# Patient Record
Sex: Female | Born: 1945 | Race: White | Hispanic: No | Marital: Married | Smoking: Former smoker
Health system: Southern US, Community
[De-identification: ages and names within clinical notes are randomized; demographics above are authoritative.]

## PROBLEM LIST (undated history)

## (undated) DIAGNOSIS — N189 Chronic kidney disease, unspecified: Secondary | ICD-10-CM

## (undated) DIAGNOSIS — E785 Hyperlipidemia, unspecified: Secondary | ICD-10-CM

## (undated) DIAGNOSIS — J449 Chronic obstructive pulmonary disease, unspecified: Secondary | ICD-10-CM

## (undated) DIAGNOSIS — F32A Depression, unspecified: Secondary | ICD-10-CM

## (undated) DIAGNOSIS — U071 COVID-19: Secondary | ICD-10-CM

## (undated) DIAGNOSIS — M1990 Unspecified osteoarthritis, unspecified site: Secondary | ICD-10-CM

## (undated) DIAGNOSIS — I10 Essential (primary) hypertension: Secondary | ICD-10-CM

## (undated) DIAGNOSIS — Z9289 Personal history of other medical treatment: Secondary | ICD-10-CM

## (undated) DIAGNOSIS — I739 Peripheral vascular disease, unspecified: Secondary | ICD-10-CM

## (undated) DIAGNOSIS — Z8719 Personal history of other diseases of the digestive system: Secondary | ICD-10-CM

## (undated) DIAGNOSIS — K219 Gastro-esophageal reflux disease without esophagitis: Secondary | ICD-10-CM

## (undated) DIAGNOSIS — C801 Malignant (primary) neoplasm, unspecified: Secondary | ICD-10-CM

## (undated) DIAGNOSIS — K449 Diaphragmatic hernia without obstruction or gangrene: Secondary | ICD-10-CM

## (undated) DIAGNOSIS — J302 Other seasonal allergic rhinitis: Secondary | ICD-10-CM

## (undated) HISTORY — DX: Hyperlipidemia, unspecified: E78.5

## (undated) HISTORY — DX: Diaphragmatic hernia without obstruction or gangrene: K44.9

## (undated) HISTORY — PX: BREAST ENHANCEMENT SURGERY: SHX7

## (undated) HISTORY — DX: Essential (primary) hypertension: I10

## (undated) HISTORY — PX: TONSILLECTOMY: SUR1361

## (undated) HISTORY — PX: TUBAL LIGATION: SHX77

---

## 1979-04-01 HISTORY — PX: BREAST SURGERY: SHX581

## 1990-07-31 HISTORY — PX: CERVICAL SPINE SURGERY: SHX589

## 1992-07-31 HISTORY — PX: BACK SURGERY: SHX140

## 2004-06-09 ENCOUNTER — Ambulatory Visit: Payer: Self-pay | Admitting: Family Medicine

## 2004-06-21 ENCOUNTER — Ambulatory Visit: Payer: Self-pay | Admitting: Family Medicine

## 2004-09-02 ENCOUNTER — Ambulatory Visit: Payer: Self-pay | Admitting: Family Medicine

## 2004-12-06 ENCOUNTER — Ambulatory Visit: Payer: Self-pay | Admitting: Family Medicine

## 2005-05-10 ENCOUNTER — Ambulatory Visit: Payer: Self-pay | Admitting: Family Medicine

## 2005-06-02 ENCOUNTER — Ambulatory Visit (HOSPITAL_COMMUNITY): Admission: RE | Admit: 2005-06-02 | Discharge: 2005-06-02 | Payer: Self-pay | Admitting: Neurosurgery

## 2005-07-04 ENCOUNTER — Ambulatory Visit: Payer: Self-pay | Admitting: Family Medicine

## 2005-07-31 HISTORY — PX: TOTAL HIP ARTHROPLASTY: SHX124

## 2005-07-31 HISTORY — PX: JOINT REPLACEMENT: SHX530

## 2005-09-26 ENCOUNTER — Ambulatory Visit: Payer: Self-pay | Admitting: Family Medicine

## 2005-10-09 ENCOUNTER — Inpatient Hospital Stay (HOSPITAL_COMMUNITY): Admission: RE | Admit: 2005-10-09 | Discharge: 2005-10-12 | Payer: Self-pay | Admitting: Orthopedic Surgery

## 2005-10-09 ENCOUNTER — Ambulatory Visit: Payer: Self-pay | Admitting: Physical Medicine & Rehabilitation

## 2007-07-22 ENCOUNTER — Emergency Department (HOSPITAL_COMMUNITY): Admission: EM | Admit: 2007-07-22 | Discharge: 2007-07-23 | Payer: Self-pay | Admitting: Emergency Medicine

## 2007-08-20 ENCOUNTER — Other Ambulatory Visit: Admission: RE | Admit: 2007-08-20 | Discharge: 2007-08-20 | Payer: Self-pay | Admitting: Pediatrics

## 2007-09-03 ENCOUNTER — Ambulatory Visit (HOSPITAL_COMMUNITY): Admission: RE | Admit: 2007-09-03 | Discharge: 2007-09-03 | Payer: Self-pay | Admitting: Family Medicine

## 2007-10-28 ENCOUNTER — Encounter: Admission: RE | Admit: 2007-10-28 | Discharge: 2007-10-28 | Payer: Self-pay | Admitting: Family Medicine

## 2008-10-28 ENCOUNTER — Encounter: Admission: RE | Admit: 2008-10-28 | Discharge: 2008-10-28 | Payer: Self-pay | Admitting: Family Medicine

## 2009-10-11 ENCOUNTER — Encounter: Admission: RE | Admit: 2009-10-11 | Discharge: 2009-10-11 | Payer: Self-pay | Admitting: *Deleted

## 2009-10-11 ENCOUNTER — Other Ambulatory Visit: Admission: RE | Admit: 2009-10-11 | Discharge: 2009-10-11 | Payer: Self-pay | Admitting: *Deleted

## 2009-10-29 ENCOUNTER — Encounter: Admission: RE | Admit: 2009-10-29 | Discharge: 2009-10-29 | Payer: Self-pay | Admitting: *Deleted

## 2010-08-20 ENCOUNTER — Encounter: Payer: Self-pay | Admitting: Neurosurgery

## 2010-11-01 ENCOUNTER — Other Ambulatory Visit: Payer: Self-pay | Admitting: Family Medicine

## 2010-11-01 DIAGNOSIS — Z1231 Encounter for screening mammogram for malignant neoplasm of breast: Secondary | ICD-10-CM

## 2010-11-10 ENCOUNTER — Ambulatory Visit
Admission: RE | Admit: 2010-11-10 | Discharge: 2010-11-10 | Disposition: A | Discharge status: Home / Self Care | Payer: Medicare Other | Source: Ambulatory Visit | Attending: Family Medicine | Admitting: Family Medicine

## 2010-11-10 DIAGNOSIS — Z1231 Encounter for screening mammogram for malignant neoplasm of breast: Secondary | ICD-10-CM

## 2010-12-16 NOTE — H&P (Signed)
[name], [name]              ACCOUNT NO.:  [id number]   MEDICAL RECORD NO.:  [id number]          PATIENT TYPE:  INP   LOCATION:  NA                           FACILITY:  [facility]   PHYSICIAN:  Ollen Gross, M.D.    DATE OF BIRTH:  Aug 31, 1945   DATE OF ADMISSION:  10/09/2005  DATE OF DISCHARGE:                                HISTORY & PHYSICAL   DATE OF OFFICE VISIT HISTORY AND PHYSICAL:  September 19, 2005.   CHIEF COMPLAINT:  Right hip pain.   HISTORY OF PRESENT ILLNESS:  Mr. Donna [name] is a 65 year old female who was  referred over from Dr. Shirlean [name] with a 2 year progressive history of  worsening dysfunction in and about the right hip. Dr. Newell [name] has worked  her up in the past and was concerned that her problem was coming more from  her hip. Hip evaluation was done and she was found to have significant  arthritic changes. She is seen in the office with progressive worsening pain  especially over the past 3 or 4 months now. X-rays in the office showed  severe end-stage arthritic changes on the right hip with bone on bone, no  joint space left but it does have an element of dysplasia on the right with  some uncovering about 25-30% of the femoral head. She is at a point now  where she has had end-stage arthritis likely secondary to dysplasia with  worsening osteoarthritis. Given her pain and dysfunction, the most  predictable means would be a total hip replacement, relieving her pain and  restoring her function. The risks and benefits have been discussed and the  patient has elected to proceed with surgery.   ALLERGIES:  THORAZINE.   CURRENT MEDICATIONS:  1.  Prilosec 20 mg daily.  2.  Metoprolol 50 mg daily.  3.  Hyoscyamine 0.125 mg dissolve 1 tab sublingual every 4 h p.r.n. spasms.   PAST MEDICAL HISTORY:  Episodic anxiety, hypertension, reflux disease,  postmenopausal, lumbar degenerative disk disease.   PAST SURGICAL HISTORY:  She has undergone 2 disk rupture  removal and fusion  and cervical spine. Also bone spur excision left big toe. Also  tonsillectomy.   SOCIAL HISTORY:  Separated, retired, one pack per day smoker, 1-2 glasses of  wine daily, 2 children. She has friends that will be a help assisting her  after surgery.   FAMILY HISTORY:  Mother with a history of arthritis, hypertension, diabetes  and renal cancer. Sister with a history of cancer. Father deceased with a  history of arthritis.   REVIEW OF SYSTEMS:  GENERAL:  No fever, chills or night sweats. NEUROLOGIC:  No seizure, syncope, paralysis. RESPIRATORY:  No shortness of breath,  productive cough or hemoptysis. CARDIOVASCULAR:  No chest pain, angina,  orthopnea. GI:  No nausea, vomiting, diarrhea or constipation. GU:  No  dysuria, hematuria or discharge. MUSCULOSKELETAL:  Right hip.   PHYSICAL EXAMINATION:  VITAL SIGNS:  Pulse 68, respirations 12, blood  pressure 136/78.  GENERAL:  A 65 year old white female, well-nourished, well-developed, tall  frame, alert, oriented and cooperative, pleasant at time  of exam. She is a  good historian.  HEENT:  Normocephalic, atraumatic. Pupils are round and reactive. Oropharynx  clear. EOM intact.  NECK:  Supple.  CHEST:  Clear anterior and posterior chest walls. No rhonchi, rales or  wheezing.  HEART:  Regular rate and rhythm without murmur. S1, S2 noted.  ABDOMEN:  Soft, nontender, bowel sounds present.  RECTAL/BREASTS/GENITALIA:  Not done not pertinent to present illness.  EXTREMITIES:  Right hip. The hip shows flexion of only 90 degrees. There is  full extension, 20 degrees of abduction, 0 internal rotation, 15 degrees of  external rotation.   IMPRESSION:  1.  Osteoarthritis right hip.  2.  Episodic anxiety.  3.  Hypertension.  4.  Reflux disease.  5.  Lumbar degenerative disk disease.  6.  Postmenopausal.   PLAN:  The patient admitted to [facility] to undergo a right total  hip arthroplasty. The surgery will be  performed by Dr. Ollen Gross. She  lives alone and would like to look into inpatient rehab postoperatively.      [name], P.A.      Ollen Gross, M.D.  Electronically Signed    ALP/MEDQ  D:  10/08/2005  T:  10/09/2005  Job:  [id number]   cc:   [name]  Fax: [phone]

## 2010-12-16 NOTE — Op Note (Signed)
Donna, [name]              ACCOUNT NO.:  [id number]   MEDICAL RECORD NO.:  [id number]          PATIENT TYPE:  INP   LOCATION:  1521                         FACILITY:  [facility]   PHYSICIAN:  Ollen Gross, M.D.    DATE OF BIRTH:  Oct 11, 1945   DATE OF PROCEDURE:  10/09/2005  DATE OF DISCHARGE:                                 OPERATIVE REPORT   PREOPERATIVE DIAGNOSIS:  Osteoarthritis right hip.   POSTOPERATIVE DIAGNOSIS:  Osteoarthritis right hip.   PROCEDURE:  Right total hip arthroplasty.   SURGEON:  Ollen Gross, M.D.   ASSISTANT:  Dwyane Luo, PA Student   ANESTHESIA:  General.   ESTIMATED BLOOD LOSS:  200 mL.   DRAIN:  None.   COMPLICATIONS:  None.   CONDITION:  Stable to recovery room.   BRIEF CLINICAL NOTE:  [name] is a 65 year old female who has end-stage  arthritis of the right hip with some dysplasia. She is having intractable  pain and presents for a total hip arthroplasty.   PROCEDURE IN DETAIL:  After successful administration of general anesthetic,  the patient is placed in the left lateral decubitus position with the right  side up and held with the hip positioner. Right lower extremity is isolated  from her perineum with plastic drapes and prepped and draped in usual  sterile fashion. A short posterolateral incision is made with a 10-blade  through the subcutaneous tissue to the level of fascia lata which is incised  in line with the skin incision. Sciatic nerve is palpated and protected and  a short rotator is isolated off the femur. Capsulectomy is performed and the  hip dislocated. The center of the femoral head is marked and a trial  prosthesis placed into the center of the trial head corresponds to the  center of her native femoral head. Osteotomy lines marked on the femoral  neck and osteotomy made with an oscillating saw. Femoral head is removed and  the femur retracted anteriorly to gain acetabular exposure. Labrum and  osteophytes removed,  the reaming begins at 45 mm. I reamed in increments of  2 mm up to 53 mm and then a 54 mm pinnacle acetabular shell was placed in  anatomic position and transfixed with two dome screws with excellent  purchase. Trial 36 mm neutral liner is placed.   The femur is prepared with the canal finder and irrigation. Axial reaming is  performed up to 11.5 mm, proximal reaming to a 12F and the sleeve machine to  a large. A 12F large trial sleeve is placed with a 16 x 11 stem and a 36-  plus-6 neck. We matched a native anteversion with the stem. A 36-plus-zero  head is placed. It reduces easily and we went to a with a 36-plus-3 which  had better tension. Range of motion was great with full extension, full  external rotation, 70 degrees of flexion, 40 degrees adduction, 90 degrees  internal rotation,  90 degrees of flexion, and 90 degrees internal rotation.  By placing the right leg on top of the left, it felt as though leg lengths  were equal. The trials were all removed, and then the permanent apex hole  eliminator was then placed into the acetabular shell. A permanent 36 mm  neutral Ultramet metal liner is placed. It is a metal-on-metal hip  replacement.   On the femoral side we placed the permanent 35F large sleeve with a  permanent 16 x 11 stem with a 36-plus-6 neck matching her native  anteversion. A 36-plus-3 head is placed and the hip is reduced with the same  stability parameters. The wound was copiously irrigated with saline solution  and short rotators reattached to the femur through drill holes. The fascia  lata was closed over Hemovac drain with interrupted #1 Vicryl, subcu closed  with #1 and 2-0 Vicryl, and subcuticular running 4-0 Monocryl. Incisions  were cleaned and dried, and Steri-Strips and a bulky sterile dressing  applied. Note, we did not place a Hemovac drain. She had barely any bleeding  thus we did not use a drain. Once the skin was closed, the incision was  cleaned and  dried; and Steri-Strips and bulky sterile dressing applied. She  is then placed into a knee immobilizer, awakened, and transported to  recovery in stable condition.      Ollen Gross, M.D.  Electronically Signed     FA/MEDQ  D:  10/09/2005  T:  10/10/2005  Job:  [id number]

## 2010-12-16 NOTE — Discharge Summary (Signed)
Donna [name], Donna [name]              ACCOUNT NO.:  [id number]   MEDICAL RECORD NO.:  [id number]          PATIENT TYPE:  INP   LOCATION:  1521                         [facility]ACILITY[facility]   PHYSICIAN:  Ollen Gross, M.D.    DATE OF BIRTH:  01/28/1946   DATE OF ADMISSION:  10/09/2005  DATE OF DISCHARGE:  10/12/2005                                 DISCHARGE SUMMARY   ADMITTING DIAGNOSES:  1.  Osteoarthritis, right hip.  2.  Episodic anxiety.  3.  Hypertension.  4.  Reflux disease.  5.  Lumbar degenerative disk disease.  6.  Postmenopausal.   DISCHARGE DIAGNOSES:  1.  Osteoarthritis, right hip, status post right total hip arthroplasty.  2.  Episodic anxiety.  3.  Hypertension.  4.  Reflux disease.  5.  Lumbar degenerative disk disease.  6.  Postmenopausal.   CONSULTATIONS:  Rehab services.   BRIEF HISTORY:  Donna [name] is a 65 year old female with end stage arthritis of  her right hip with some dysplasia and intractable pain, now presents for a  total hip arthroplasty.  Preoperative CBC, hemoglobin 15.5, hematocrit of  43.4, white cell count 7.6.  Postoperative hemoglobin 12.7, H & H 12.0 and  34.2.  PT/PTT preoperatively 13.0 and 30, respectively, INR 1.0.  Serial pro-  times were followed, last PT/INR 26.0 and 2.4.  Chem panel on admission all  within normal limits.  Serial BMET were followed, glucose went up from 118  to 134, last noted at 135.  Electrolytes remained within normal limits.  Preoperative BUN negative.  Blood group type O positive.   EKG, dated October 04, 2005, normal sinus rhythm, normal EKG, no previous  tracing, confirmed by Dr. Graceann Congress.  Two-view chest, October 04, 2005, no  acute cardiopulmonary process.  Right hip films, October 04, 2005, bilateral  hip osteoarthritis as above, right greater than left.  Hip and pelvis film  on October 09, 2005, satisfactory right total hip.   HOSPITAL COURSE:  The patient was admitted to [facility] and  underwent the  above-stated procedure and tolerated it well, placed on PCA  and p.o. analgesic for pain control following surgery.  On day one, she was  doing well except complaints of hip pain, had a little bit of a low-grade  temperature, treated with antipyretics and incentive spirometer.  She  started getting up out of bed by day two.  She was feeling much better and  actually walked 200 feet with a rolling walker.  She was doing extremely  well and felt that she was doing so well that she would be able to do home.  Rehab called and so it was ordered postoperatively and felt that the patient  would progress well but if did not, would require inpatient SACU services.  Fortunately, she did very well with her physical therapy, however her  bedroom is upstairs so arranged hospital bed.  Once all the arrangements  were made, she was discharged home on the following day on October 12, 2005.   DISCHARGE PLAN:  1.  The patient was discharged to home on October 12, 2005.  2.  Discharge diagnoses:  Please see above.  3.  Discharge medications:  Coumadin, Percocet, Robaxin.  4.  Diet:  As tolerated.  5.  Follow-up:  Two weeks.  6.  Activity:  Partial weightbearing, 25-50%, right lower extremity.  Home      with PT, home with nursing.  May start showering.   DISPOSITION:  Home.   CONDITION UPON DISCHARGE:  Improved.      [name], P.A.      Ollen Gross, M.D.  Electronically Signed    ALP/MEDQ  D:  11/15/2005  T:  11/16/2005  Job:  [id number]   cc:   Ollen Gross, M.D.  Fax: [phone]   [name]  Fax: [phone]

## 2010-12-29 ENCOUNTER — Encounter: Payer: Self-pay | Admitting: Pulmonary Disease

## 2010-12-29 ENCOUNTER — Ambulatory Visit (INDEPENDENT_AMBULATORY_CARE_PROVIDER_SITE_OTHER): Payer: Medicare Other | Admitting: Pulmonary Disease

## 2010-12-29 ENCOUNTER — Ambulatory Visit (INDEPENDENT_AMBULATORY_CARE_PROVIDER_SITE_OTHER)
Admission: RE | Admit: 2010-12-29 | Discharge: 2010-12-29 | Disposition: A | Discharge status: Home / Self Care | Payer: Medicare Other | Source: Ambulatory Visit | Attending: Pulmonary Disease | Admitting: Pulmonary Disease

## 2010-12-29 VITALS — BP 144/76 | HR 52 | Temp 97.7°F | Ht 66.5 in | Wt 180.4 lb

## 2010-12-29 DIAGNOSIS — R0989 Other specified symptoms and signs involving the circulatory and respiratory systems: Secondary | ICD-10-CM

## 2010-12-29 DIAGNOSIS — R0609 Other forms of dyspnea: Secondary | ICD-10-CM

## 2010-12-29 DIAGNOSIS — R0600 Dyspnea, unspecified: Secondary | ICD-10-CM | POA: Insufficient documentation

## 2010-12-29 NOTE — Patient Instructions (Signed)
Will check a cxr today, and will call with results. Will schedule for breathing studies, and see you back same day to discuss results.

## 2010-12-29 NOTE — Assessment & Plan Note (Signed)
The pt notes doe that is interfering with her QOL.  She does have a h/o smoking, but has quit since 16mos ago.  She also has gained 30 pounds the last one year, and has not been able to lose despite trying to exercise consistently.  At this point, she will need full pfts and cxr for evaluation.  Will also make sure she is not hypothyroid or anemic.  She has had recent bloodwork with her primary md.

## 2010-12-29 NOTE — Progress Notes (Signed)
  Subjective:    Patient ID: Donna [name], female    DOB: Jun 17, 1946, 65 y.o.   MRN: [id number]  HPI The pt is a 65y/o female who I have been asked to see for evaluation of doe.  The pt reports doe over the last one year, but does not feel it has been incremental.  She does feel it is interfering with her perceived QOL.  Her sob primarily occurs with more moderate to heavier exertional activities such as stairs and larger hills.  She denies any issues with her ADL's.  She denies any cough, congestion, or mucus.  She does note hoarseness and gives a classic description of upper airway wheezing.  She does have a hiatal hernia.  She has no h/o heart disease or LE edema.  She has had recent bloodwork and was told OK.  She states that her weight is up 30 pounds the past one year!  She has a long history of smoking, but has not done so in one year.  She has not had a recent cxr or pfts.     Review of Systems  Constitutional: Positive for unexpected weight change. Negative for fever.  HENT: Negative for ear pain, nosebleeds, congestion, sore throat, rhinorrhea, sneezing, trouble swallowing, dental problem, postnasal drip and sinus pressure.   Eyes: Negative for redness and itching.  Respiratory: Positive for shortness of breath. Negative for cough, chest tightness and wheezing.   Cardiovascular: Negative for palpitations and leg swelling.  Gastrointestinal: Negative for nausea and vomiting.  Genitourinary: Negative for dysuria.  Musculoskeletal: Negative for joint swelling.  Skin: Negative for rash.  Neurological: Negative for headaches.  Hematological: Does not bruise/bleed easily.  Psychiatric/Behavioral: Negative for dysphoric mood. The patient is not nervous/anxious.        Objective:   Physical Exam Constitutional:  Well developed, no acute distress  HENT:  Nares patent without discharge,mild turbinate hypertrophy  Oropharynx without exudate, palate and uvula are normal  Eyes:  Perrla,  eomi, no scleral icterus  Neck:  No JVD, no TMG  Cardiovascular:  Normal rate, regular rhythm, no rubs or gallops.  No murmurs        Intact distal pulses  Pulmonary :  Normal breath sounds, no stridor or respiratory distress   No rales, rhonchi, or wheezing  Abdominal:  Soft, nondistended, bowel sounds present.  No tenderness noted.   Musculoskeletal:  No lower extremity edema noted.  Lymph Nodes:  No cervical lymphadenopathy noted  Skin:  No cyanosis noted  Neurologic:  Alert, appropriate, moves all 4 extremities without obvious deficit.         Assessment & Plan:

## 2011-01-20 ENCOUNTER — Ambulatory Visit (INDEPENDENT_AMBULATORY_CARE_PROVIDER_SITE_OTHER): Payer: Medicare Other | Admitting: Pulmonary Disease

## 2011-01-20 ENCOUNTER — Encounter: Payer: Self-pay | Admitting: Pulmonary Disease

## 2011-01-20 VITALS — BP 132/72 | HR 71 | Temp 98.0°F | Ht 67.0 in | Wt 177.0 lb

## 2011-01-20 DIAGNOSIS — J449 Chronic obstructive pulmonary disease, unspecified: Secondary | ICD-10-CM | POA: Insufficient documentation

## 2011-01-20 DIAGNOSIS — R0600 Dyspnea, unspecified: Secondary | ICD-10-CM

## 2011-01-20 DIAGNOSIS — J4489 Other specified chronic obstructive pulmonary disease: Secondary | ICD-10-CM

## 2011-01-20 DIAGNOSIS — R0609 Other forms of dyspnea: Secondary | ICD-10-CM

## 2011-01-20 DIAGNOSIS — R0989 Other specified symptoms and signs involving the circulatory and respiratory systems: Secondary | ICD-10-CM

## 2011-01-20 LAB — PULMONARY FUNCTION TEST

## 2011-01-20 MED ORDER — ALBUTEROL SULFATE HFA 108 (90 BASE) MCG/ACT IN AERS: 2.0000 | INHALATION_SPRAY | Freq: Four times a day (QID) | RESPIRATORY_TRACT | Status: DC | PRN

## 2011-01-20 MED ORDER — TIOTROPIUM BROMIDE MONOHYDRATE 18 MCG IN CAPS: 18.0000 ug | ORAL_CAPSULE | Freq: Every day | RESPIRATORY_TRACT | Status: DC

## 2011-01-20 NOTE — Patient Instructions (Signed)
Start on spiriva one inhalation each am. Trial of proair 2 puffs every 6hrs if needed for rescue followup with me in 2mos, but call if you feel this is not helping.

## 2011-01-20 NOTE — Progress Notes (Signed)
  Subjective:    Patient ID: Donna [name], female    DOB: [birth year], 65 y.o.   MRN: [id number]  HPI The pt comes in today for f/u of her pfts.  She was found to have moderate airflow obstruction with airtrapping, but no restriction.  Her dlco was moderately reduced.  Her cxr showed hyperinflation, but no acute process. I have reviewed the studies with her in detail, and answered all of her questions.  The pt tells me she has been exercising more, and feels her sob is getting better.    Review of Systems  Constitutional: Negative for fever and unexpected weight change.  HENT: Positive for congestion and rhinorrhea. Negative for ear pain, nosebleeds, sore throat, sneezing, trouble swallowing, dental problem, postnasal drip and sinus pressure.   Eyes: Negative for redness and itching.  Respiratory: Positive for shortness of breath. Negative for cough, chest tightness and wheezing.   Cardiovascular: Negative for palpitations and leg swelling.  Gastrointestinal: Negative for nausea and vomiting.  Genitourinary: Negative for dysuria.  Musculoskeletal: Negative for joint swelling.  Skin: Negative for rash.  Neurological: Positive for headaches.  Hematological: Does not bruise/bleed easily.  Psychiatric/Behavioral: Negative for dysphoric mood. The patient is not nervous/anxious.        Objective:   Physical Exam Ow female in nad Nares without discharge, no purulence LE without edema, no cyanosis  Alert and oriented, moves all 4        Assessment & Plan:

## 2011-01-20 NOTE — Progress Notes (Signed)
PFT done today. 

## 2011-01-28 NOTE — Assessment & Plan Note (Signed)
The pt has moderate airflow obstruction by her recent pfts, and would benefit from a maintenance bronchodilator regimen.  I have also encouraged her to continue working on a conditioning program and weight loss.   

## 2011-01-28 NOTE — Assessment & Plan Note (Signed)
The pt has moderate airflow obstruction by her recent pfts, and would benefit from a maintenance bronchodilator regimen.  I have also encouraged her to continue working on a conditioning program and weight loss.

## 2011-03-22 ENCOUNTER — Encounter: Payer: Self-pay | Admitting: Pulmonary Disease

## 2011-03-22 ENCOUNTER — Ambulatory Visit (INDEPENDENT_AMBULATORY_CARE_PROVIDER_SITE_OTHER): Payer: Medicare Other | Admitting: Pulmonary Disease

## 2011-03-22 VITALS — BP 126/64 | HR 73 | Temp 97.6°F | Ht 67.0 in | Wt 181.2 lb

## 2011-03-22 DIAGNOSIS — J449 Chronic obstructive pulmonary disease, unspecified: Secondary | ICD-10-CM

## 2011-03-22 NOTE — Patient Instructions (Signed)
Ok to give yourself a trial off spiriva, and just use albuterol as needed. Continue with your exercise program followup with me in 6mos.

## 2011-03-22 NOTE — Progress Notes (Signed)
  Subjective:    Patient ID: Donna [name], female    DOB: [birth year], 65 y.o.   MRN: [id number]  HPI The patient comes in today for followup of her known moderate emphysema.  She has been treated with Spiriva as well as as needed albuterol, but really doesn't feel the medications have made a big difference in her breathing.  However, she has seen improvement in her breathing over the last few months, and she feels this is do to her ongoing exercise program.  She denies any cough, chest congestion, or purulent mucus.  She has not had an acute exacerbation since last visit.   Review of Systems  Constitutional: Negative for fever and unexpected weight change.  HENT: Positive for congestion, rhinorrhea and postnasal drip. Negative for ear pain, nosebleeds, sore throat, sneezing, trouble swallowing, dental problem and sinus pressure.   Eyes: Negative for redness and itching.  Respiratory: Negative for cough, chest tightness, shortness of breath and wheezing.   Cardiovascular: Negative for palpitations and leg swelling.  Gastrointestinal: Negative for nausea and vomiting.  Genitourinary: Negative for dysuria.  Musculoskeletal: Negative for joint swelling.  Skin: Negative for rash.  Neurological: Negative for headaches.  Hematological: Does not bruise/bleed easily.  Psychiatric/Behavioral: Negative for dysphoric mood. The patient is not nervous/anxious.        Objective:   Physical Exam Overweight female in no acute distress Nose without discharge or purulence noted Chest with mildly decreased breath sounds, no wheezes or rhonchi Cardiac exam with regular rate and rhythm Lower extremities with mild varicosities, no significant edema, no cyanosis noted Alert and oriented, moves all 4 extremities.       Assessment & Plan:

## 2011-03-22 NOTE — Assessment & Plan Note (Signed)
The patient feels that her breathing is much improved since being on an exercise program, but doesn't feel the Spiriva is contributing a lot to the improvement.  I told her that she can discontinue the Spiriva for a 4-8 week trial and see if she notices a difference.  She can use albuterol as needed during this time period.  However, if she has any acute exacerbations or if she is having to overuse her albuterol, I think she needs to get back on some type of maintenance medication.  We could try a LABA/ICS at that time in the place of spiriva.

## 2011-09-27 ENCOUNTER — Ambulatory Visit: Payer: Medicare Other | Admitting: Pulmonary Disease

## 2011-10-04 ENCOUNTER — Ambulatory Visit (INDEPENDENT_AMBULATORY_CARE_PROVIDER_SITE_OTHER): Payer: Medicare Other | Admitting: Pulmonary Disease

## 2011-10-04 ENCOUNTER — Encounter: Payer: Self-pay | Admitting: Pulmonary Disease

## 2011-10-04 VITALS — BP 128/74 | HR 90 | Temp 98.1°F | Ht 67.0 in | Wt 175.6 lb

## 2011-10-04 DIAGNOSIS — J449 Chronic obstructive pulmonary disease, unspecified: Secondary | ICD-10-CM

## 2011-10-04 NOTE — Progress Notes (Signed)
  Subjective:    Patient ID: Donna [name], female    DOB: 06-04-1946, 66 y.o.   MRN: [id number]  HPI Patient comes in today for followup of her known COPD.  She has come off the Spiriva, and is using albuterol as needed for shortness of breath.  Since last visit, she is continued on an aggressive exercise program, and has continued to lose weight.  She feels that her breathing is doing very well, and is satisfied with her exertional tolerance.  She has only used her albuterol twice since the last visit, has had no acute exacerbations or chest cold.   Review of Systems  Constitutional: Negative for fever and unexpected weight change.  HENT: Positive for rhinorrhea. Negative for ear pain, nosebleeds, congestion, sore throat, sneezing, trouble swallowing, dental problem, postnasal drip and sinus pressure.   Eyes: Negative for redness and itching.  Respiratory: Positive for wheezing. Negative for cough, chest tightness and shortness of breath.   Cardiovascular: Negative for palpitations and leg swelling.  Gastrointestinal: Negative for nausea and vomiting.  Genitourinary: Negative for dysuria.  Musculoskeletal: Negative for joint swelling.  Skin: Negative for rash.  Neurological: Negative for headaches.  Hematological: Does not bruise/bleed easily.  Psychiatric/Behavioral: Negative for dysphoric mood. The patient is not nervous/anxious.        Objective:   Physical Exam Wd female in nad Nose without purulence or discharge Chest with mild decrease in BS, no wheezing Cor with rrr LE without edema, no cyanosis Alert and oriented, moves all 4.        Assessment & Plan:

## 2011-10-04 NOTE — Assessment & Plan Note (Signed)
The patient is doing very well from a COPD standpoint.  She is exercising regularly, has lost weight, and rarely requires her rescue inhaler.  Because she is satisfied with her exertional tolerance, and is not having acute exacerbations, I am okay with her being off maintenance medications.  I have asked her to followup with me in one year, but to call if she is having issues.

## 2011-10-04 NOTE — Patient Instructions (Signed)
Continue on albuterol as needed. Can try chlorpheniramine 8mg  at bedtime, and can take another dose at lunch if needed. If doing well, followup with me in one year.

## 2011-11-02 DIAGNOSIS — E559 Vitamin D deficiency, unspecified: Secondary | ICD-10-CM | POA: Diagnosis not present

## 2011-11-02 DIAGNOSIS — I10 Essential (primary) hypertension: Secondary | ICD-10-CM | POA: Diagnosis not present

## 2011-11-02 DIAGNOSIS — J309 Allergic rhinitis, unspecified: Secondary | ICD-10-CM | POA: Diagnosis not present

## 2011-11-02 DIAGNOSIS — R002 Palpitations: Secondary | ICD-10-CM | POA: Diagnosis not present

## 2011-11-03 DIAGNOSIS — I10 Essential (primary) hypertension: Secondary | ICD-10-CM | POA: Diagnosis not present

## 2011-11-03 DIAGNOSIS — E559 Vitamin D deficiency, unspecified: Secondary | ICD-10-CM | POA: Diagnosis not present

## 2011-11-06 DIAGNOSIS — I10 Essential (primary) hypertension: Secondary | ICD-10-CM | POA: Diagnosis not present

## 2011-11-08 ENCOUNTER — Other Ambulatory Visit (HOSPITAL_COMMUNITY)
Admission: RE | Admit: 2011-11-08 | Discharge: 2011-11-08 | Disposition: A | Discharge status: Home / Self Care | Payer: Medicare Other | Source: Ambulatory Visit | Attending: Internal Medicine | Admitting: Internal Medicine

## 2011-11-08 ENCOUNTER — Other Ambulatory Visit: Payer: Self-pay | Admitting: Registered Nurse

## 2011-11-08 DIAGNOSIS — I10 Essential (primary) hypertension: Secondary | ICD-10-CM | POA: Diagnosis not present

## 2011-11-08 DIAGNOSIS — Z124 Encounter for screening for malignant neoplasm of cervix: Secondary | ICD-10-CM | POA: Insufficient documentation

## 2011-11-08 DIAGNOSIS — Z01419 Encounter for gynecological examination (general) (routine) without abnormal findings: Secondary | ICD-10-CM | POA: Diagnosis not present

## 2011-11-08 DIAGNOSIS — Z1212 Encounter for screening for malignant neoplasm of rectum: Secondary | ICD-10-CM | POA: Diagnosis not present

## 2011-11-09 ENCOUNTER — Other Ambulatory Visit: Payer: Self-pay | Admitting: Internal Medicine

## 2011-11-09 DIAGNOSIS — Z1231 Encounter for screening mammogram for malignant neoplasm of breast: Secondary | ICD-10-CM

## 2011-11-14 DIAGNOSIS — E8940 Asymptomatic postprocedural ovarian failure: Secondary | ICD-10-CM | POA: Diagnosis not present

## 2011-11-21 ENCOUNTER — Ambulatory Visit
Admission: RE | Admit: 2011-11-21 | Discharge: 2011-11-21 | Disposition: A | Discharge status: Home / Self Care | Payer: Medicare Other | Source: Ambulatory Visit | Attending: Internal Medicine | Admitting: Internal Medicine

## 2011-11-21 DIAGNOSIS — Z1231 Encounter for screening mammogram for malignant neoplasm of breast: Secondary | ICD-10-CM | POA: Diagnosis not present

## 2011-11-27 ENCOUNTER — Other Ambulatory Visit: Payer: Self-pay | Admitting: Registered Nurse

## 2011-11-27 DIAGNOSIS — D1801 Hemangioma of skin and subcutaneous tissue: Secondary | ICD-10-CM | POA: Diagnosis not present

## 2011-11-28 DIAGNOSIS — D239 Other benign neoplasm of skin, unspecified: Secondary | ICD-10-CM | POA: Diagnosis not present

## 2012-02-05 DIAGNOSIS — I10 Essential (primary) hypertension: Secondary | ICD-10-CM | POA: Diagnosis not present

## 2012-02-05 DIAGNOSIS — J309 Allergic rhinitis, unspecified: Secondary | ICD-10-CM | POA: Diagnosis not present

## 2012-02-05 DIAGNOSIS — R002 Palpitations: Secondary | ICD-10-CM | POA: Diagnosis not present

## 2012-02-28 DIAGNOSIS — M1990 Unspecified osteoarthritis, unspecified site: Secondary | ICD-10-CM | POA: Diagnosis not present

## 2012-03-07 DIAGNOSIS — M1990 Unspecified osteoarthritis, unspecified site: Secondary | ICD-10-CM | POA: Diagnosis not present

## 2012-03-12 DIAGNOSIS — M1990 Unspecified osteoarthritis, unspecified site: Secondary | ICD-10-CM | POA: Diagnosis not present

## 2012-03-14 DIAGNOSIS — M1990 Unspecified osteoarthritis, unspecified site: Secondary | ICD-10-CM | POA: Diagnosis not present

## 2012-03-18 DIAGNOSIS — M1990 Unspecified osteoarthritis, unspecified site: Secondary | ICD-10-CM | POA: Diagnosis not present

## 2012-03-20 DIAGNOSIS — M1990 Unspecified osteoarthritis, unspecified site: Secondary | ICD-10-CM | POA: Diagnosis not present

## 2012-03-26 DIAGNOSIS — M1990 Unspecified osteoarthritis, unspecified site: Secondary | ICD-10-CM | POA: Diagnosis not present

## 2012-03-28 DIAGNOSIS — M1990 Unspecified osteoarthritis, unspecified site: Secondary | ICD-10-CM | POA: Diagnosis not present

## 2012-04-16 DIAGNOSIS — M25569 Pain in unspecified knee: Secondary | ICD-10-CM | POA: Diagnosis not present

## 2012-04-16 DIAGNOSIS — M5137 Other intervertebral disc degeneration, lumbosacral region: Secondary | ICD-10-CM | POA: Diagnosis not present

## 2012-04-16 DIAGNOSIS — M7020 Olecranon bursitis, unspecified elbow: Secondary | ICD-10-CM | POA: Diagnosis not present

## 2012-05-01 DIAGNOSIS — M5137 Other intervertebral disc degeneration, lumbosacral region: Secondary | ICD-10-CM | POA: Diagnosis not present

## 2012-05-08 DIAGNOSIS — N898 Other specified noninflammatory disorders of vagina: Secondary | ICD-10-CM | POA: Diagnosis not present

## 2012-05-08 DIAGNOSIS — J069 Acute upper respiratory infection, unspecified: Secondary | ICD-10-CM | POA: Diagnosis not present

## 2012-05-15 DIAGNOSIS — M5137 Other intervertebral disc degeneration, lumbosacral region: Secondary | ICD-10-CM | POA: Diagnosis not present

## 2012-05-24 DIAGNOSIS — Z23 Encounter for immunization: Secondary | ICD-10-CM | POA: Diagnosis not present

## 2012-06-12 DIAGNOSIS — M5137 Other intervertebral disc degeneration, lumbosacral region: Secondary | ICD-10-CM | POA: Diagnosis not present

## 2012-07-03 DIAGNOSIS — I789 Disease of capillaries, unspecified: Secondary | ICD-10-CM | POA: Diagnosis not present

## 2012-07-03 DIAGNOSIS — D1801 Hemangioma of skin and subcutaneous tissue: Secondary | ICD-10-CM | POA: Diagnosis not present

## 2012-07-03 DIAGNOSIS — L821 Other seborrheic keratosis: Secondary | ICD-10-CM | POA: Diagnosis not present

## 2012-07-03 DIAGNOSIS — L570 Actinic keratosis: Secondary | ICD-10-CM | POA: Diagnosis not present

## 2012-07-03 DIAGNOSIS — D485 Neoplasm of uncertain behavior of skin: Secondary | ICD-10-CM | POA: Diagnosis not present

## 2012-07-03 DIAGNOSIS — L819 Disorder of pigmentation, unspecified: Secondary | ICD-10-CM | POA: Diagnosis not present

## 2012-08-08 DIAGNOSIS — M2550 Pain in unspecified joint: Secondary | ICD-10-CM | POA: Diagnosis not present

## 2012-08-20 DIAGNOSIS — M2550 Pain in unspecified joint: Secondary | ICD-10-CM | POA: Diagnosis not present

## 2012-08-21 DIAGNOSIS — M2020 Hallux rigidus, unspecified foot: Secondary | ICD-10-CM | POA: Diagnosis not present

## 2012-09-04 DIAGNOSIS — M069 Rheumatoid arthritis, unspecified: Secondary | ICD-10-CM | POA: Diagnosis not present

## 2012-09-04 DIAGNOSIS — M79609 Pain in unspecified limb: Secondary | ICD-10-CM | POA: Diagnosis not present

## 2012-09-04 DIAGNOSIS — M159 Polyosteoarthritis, unspecified: Secondary | ICD-10-CM | POA: Diagnosis not present

## 2012-10-02 ENCOUNTER — Ambulatory Visit: Payer: Medicare Other | Admitting: Pulmonary Disease

## 2012-10-23 DIAGNOSIS — Z733 Stress, not elsewhere classified: Secondary | ICD-10-CM | POA: Diagnosis not present

## 2012-10-23 DIAGNOSIS — I10 Essential (primary) hypertension: Secondary | ICD-10-CM | POA: Diagnosis not present

## 2012-11-04 DIAGNOSIS — M069 Rheumatoid arthritis, unspecified: Secondary | ICD-10-CM | POA: Diagnosis not present

## 2012-11-04 DIAGNOSIS — M159 Polyosteoarthritis, unspecified: Secondary | ICD-10-CM | POA: Diagnosis not present

## 2013-01-06 DIAGNOSIS — M069 Rheumatoid arthritis, unspecified: Secondary | ICD-10-CM | POA: Diagnosis not present

## 2013-01-06 DIAGNOSIS — M159 Polyosteoarthritis, unspecified: Secondary | ICD-10-CM | POA: Diagnosis not present

## 2013-02-11 DIAGNOSIS — I10 Essential (primary) hypertension: Secondary | ICD-10-CM | POA: Diagnosis not present

## 2013-02-11 DIAGNOSIS — M069 Rheumatoid arthritis, unspecified: Secondary | ICD-10-CM | POA: Diagnosis not present

## 2013-02-14 DIAGNOSIS — L819 Disorder of pigmentation, unspecified: Secondary | ICD-10-CM | POA: Diagnosis not present

## 2013-02-14 DIAGNOSIS — L821 Other seborrheic keratosis: Secondary | ICD-10-CM | POA: Diagnosis not present

## 2013-02-14 DIAGNOSIS — D485 Neoplasm of uncertain behavior of skin: Secondary | ICD-10-CM | POA: Diagnosis not present

## 2013-02-14 DIAGNOSIS — C44319 Basal cell carcinoma of skin of other parts of face: Secondary | ICD-10-CM | POA: Diagnosis not present

## 2013-02-14 DIAGNOSIS — L570 Actinic keratosis: Secondary | ICD-10-CM | POA: Diagnosis not present

## 2013-03-11 DIAGNOSIS — I10 Essential (primary) hypertension: Secondary | ICD-10-CM | POA: Diagnosis not present

## 2013-03-11 DIAGNOSIS — M25569 Pain in unspecified knee: Secondary | ICD-10-CM | POA: Diagnosis not present

## 2013-05-05 DIAGNOSIS — M25569 Pain in unspecified knee: Secondary | ICD-10-CM | POA: Diagnosis not present

## 2013-05-05 DIAGNOSIS — R6889 Other general symptoms and signs: Secondary | ICD-10-CM | POA: Diagnosis not present

## 2013-05-05 DIAGNOSIS — M25549 Pain in joints of unspecified hand: Secondary | ICD-10-CM | POA: Diagnosis not present

## 2013-05-05 DIAGNOSIS — M25579 Pain in unspecified ankle and joints of unspecified foot: Secondary | ICD-10-CM | POA: Diagnosis not present

## 2013-05-28 DIAGNOSIS — M25569 Pain in unspecified knee: Secondary | ICD-10-CM | POA: Diagnosis not present

## 2013-07-02 DIAGNOSIS — D1801 Hemangioma of skin and subcutaneous tissue: Secondary | ICD-10-CM | POA: Diagnosis not present

## 2013-07-02 DIAGNOSIS — L570 Actinic keratosis: Secondary | ICD-10-CM | POA: Diagnosis not present

## 2013-07-02 DIAGNOSIS — L299 Pruritus, unspecified: Secondary | ICD-10-CM | POA: Diagnosis not present

## 2013-07-02 DIAGNOSIS — L909 Atrophic disorder of skin, unspecified: Secondary | ICD-10-CM | POA: Diagnosis not present

## 2013-07-02 DIAGNOSIS — L821 Other seborrheic keratosis: Secondary | ICD-10-CM | POA: Diagnosis not present

## 2013-07-02 DIAGNOSIS — L819 Disorder of pigmentation, unspecified: Secondary | ICD-10-CM | POA: Diagnosis not present

## 2013-07-02 DIAGNOSIS — Z85828 Personal history of other malignant neoplasm of skin: Secondary | ICD-10-CM | POA: Diagnosis not present

## 2013-09-10 DIAGNOSIS — J309 Allergic rhinitis, unspecified: Secondary | ICD-10-CM | POA: Diagnosis not present

## 2013-09-10 DIAGNOSIS — I10 Essential (primary) hypertension: Secondary | ICD-10-CM | POA: Diagnosis not present

## 2013-09-24 DIAGNOSIS — L821 Other seborrheic keratosis: Secondary | ICD-10-CM | POA: Diagnosis not present

## 2013-09-24 DIAGNOSIS — L738 Other specified follicular disorders: Secondary | ICD-10-CM | POA: Diagnosis not present

## 2013-09-24 DIAGNOSIS — L570 Actinic keratosis: Secondary | ICD-10-CM | POA: Diagnosis not present

## 2013-09-24 DIAGNOSIS — Z85828 Personal history of other malignant neoplasm of skin: Secondary | ICD-10-CM | POA: Diagnosis not present

## 2013-10-07 DIAGNOSIS — M1710 Unilateral primary osteoarthritis, unspecified knee: Secondary | ICD-10-CM | POA: Diagnosis not present

## 2013-10-07 DIAGNOSIS — M359 Systemic involvement of connective tissue, unspecified: Secondary | ICD-10-CM | POA: Diagnosis not present

## 2013-10-07 DIAGNOSIS — Z09 Encounter for follow-up examination after completed treatment for conditions other than malignant neoplasm: Secondary | ICD-10-CM | POA: Diagnosis not present

## 2013-11-10 DIAGNOSIS — M359 Systemic involvement of connective tissue, unspecified: Secondary | ICD-10-CM | POA: Diagnosis not present

## 2013-11-10 DIAGNOSIS — Z09 Encounter for follow-up examination after completed treatment for conditions other than malignant neoplasm: Secondary | ICD-10-CM | POA: Diagnosis not present

## 2013-11-10 DIAGNOSIS — M25569 Pain in unspecified knee: Secondary | ICD-10-CM | POA: Diagnosis not present

## 2013-11-10 DIAGNOSIS — M1710 Unilateral primary osteoarthritis, unspecified knee: Secondary | ICD-10-CM | POA: Diagnosis not present

## 2013-11-17 DIAGNOSIS — H6120 Impacted cerumen, unspecified ear: Secondary | ICD-10-CM | POA: Diagnosis not present

## 2013-11-20 DIAGNOSIS — M2390 Unspecified internal derangement of unspecified knee: Secondary | ICD-10-CM | POA: Diagnosis not present

## 2014-01-15 DIAGNOSIS — N644 Mastodynia: Secondary | ICD-10-CM | POA: Diagnosis not present

## 2014-01-26 ENCOUNTER — Other Ambulatory Visit: Payer: Self-pay | Admitting: Internal Medicine

## 2014-01-26 DIAGNOSIS — N644 Mastodynia: Secondary | ICD-10-CM

## 2014-02-04 ENCOUNTER — Ambulatory Visit
Admission: RE | Admit: 2014-02-04 | Discharge: 2014-02-04 | Disposition: A | Discharge status: Home / Self Care | Payer: 59 | Source: Ambulatory Visit | Attending: Internal Medicine | Admitting: Internal Medicine

## 2014-02-04 DIAGNOSIS — N644 Mastodynia: Secondary | ICD-10-CM

## 2014-02-05 DIAGNOSIS — M1710 Unilateral primary osteoarthritis, unspecified knee: Secondary | ICD-10-CM | POA: Diagnosis not present

## 2014-02-12 DIAGNOSIS — M1710 Unilateral primary osteoarthritis, unspecified knee: Secondary | ICD-10-CM | POA: Diagnosis not present

## 2014-02-19 DIAGNOSIS — M1710 Unilateral primary osteoarthritis, unspecified knee: Secondary | ICD-10-CM | POA: Diagnosis not present

## 2014-02-26 ENCOUNTER — Ambulatory Visit
Admission: RE | Admit: 2014-02-26 | Discharge: 2014-02-26 | Disposition: A | Discharge status: Home / Self Care | Payer: 59 | Source: Ambulatory Visit | Attending: Internal Medicine | Admitting: Internal Medicine

## 2014-02-26 DIAGNOSIS — N644 Mastodynia: Secondary | ICD-10-CM | POA: Diagnosis not present

## 2014-02-26 MED ORDER — GADOBENATE DIMEGLUMINE 529 MG/ML IV SOLN
8.0000 mL | Freq: Once | INTRAVENOUS | Status: AC | PRN
  Administered 2014-02-26: 8 mL via INTRAVENOUS

## 2014-03-31 DIAGNOSIS — L570 Actinic keratosis: Secondary | ICD-10-CM | POA: Diagnosis not present

## 2014-03-31 DIAGNOSIS — Z85828 Personal history of other malignant neoplasm of skin: Secondary | ICD-10-CM | POA: Diagnosis not present

## 2014-04-01 DIAGNOSIS — M1710 Unilateral primary osteoarthritis, unspecified knee: Secondary | ICD-10-CM | POA: Diagnosis not present

## 2014-04-01 DIAGNOSIS — R609 Edema, unspecified: Secondary | ICD-10-CM | POA: Diagnosis not present

## 2014-05-21 DIAGNOSIS — I10 Essential (primary) hypertension: Secondary | ICD-10-CM | POA: Diagnosis not present

## 2014-05-21 DIAGNOSIS — Z23 Encounter for immunization: Secondary | ICD-10-CM | POA: Diagnosis not present

## 2014-05-21 DIAGNOSIS — M778 Other enthesopathies, not elsewhere classified: Secondary | ICD-10-CM | POA: Diagnosis not present

## 2014-05-26 DIAGNOSIS — M1811 Unilateral primary osteoarthritis of first carpometacarpal joint, right hand: Secondary | ICD-10-CM | POA: Diagnosis not present

## 2014-05-26 DIAGNOSIS — M1712 Unilateral primary osteoarthritis, left knee: Secondary | ICD-10-CM | POA: Diagnosis not present

## 2014-05-26 DIAGNOSIS — M1812 Unilateral primary osteoarthritis of first carpometacarpal joint, left hand: Secondary | ICD-10-CM | POA: Diagnosis not present

## 2014-06-22 DIAGNOSIS — L821 Other seborrheic keratosis: Secondary | ICD-10-CM | POA: Diagnosis not present

## 2014-06-22 DIAGNOSIS — L570 Actinic keratosis: Secondary | ICD-10-CM | POA: Diagnosis not present

## 2014-06-22 DIAGNOSIS — D485 Neoplasm of uncertain behavior of skin: Secondary | ICD-10-CM | POA: Diagnosis not present

## 2014-06-22 DIAGNOSIS — D1800 Hemangioma unspecified site: Secondary | ICD-10-CM | POA: Diagnosis not present

## 2014-06-22 DIAGNOSIS — L820 Inflamed seborrheic keratosis: Secondary | ICD-10-CM | POA: Diagnosis not present

## 2014-06-22 DIAGNOSIS — D239 Other benign neoplasm of skin, unspecified: Secondary | ICD-10-CM | POA: Diagnosis not present

## 2014-06-30 DIAGNOSIS — M1811 Unilateral primary osteoarthritis of first carpometacarpal joint, right hand: Secondary | ICD-10-CM | POA: Diagnosis not present

## 2014-06-30 DIAGNOSIS — M1812 Unilateral primary osteoarthritis of first carpometacarpal joint, left hand: Secondary | ICD-10-CM | POA: Diagnosis not present

## 2014-07-31 HISTORY — PX: LUMBAR SPINE SURGERY: SHX701

## 2014-08-07 DIAGNOSIS — D0461 Carcinoma in situ of skin of right upper limb, including shoulder: Secondary | ICD-10-CM | POA: Diagnosis not present

## 2014-08-10 DIAGNOSIS — M65352 Trigger finger, left little finger: Secondary | ICD-10-CM | POA: Diagnosis not present

## 2014-08-10 DIAGNOSIS — M1811 Unilateral primary osteoarthritis of first carpometacarpal joint, right hand: Secondary | ICD-10-CM | POA: Diagnosis not present

## 2014-08-10 DIAGNOSIS — M1812 Unilateral primary osteoarthritis of first carpometacarpal joint, left hand: Secondary | ICD-10-CM | POA: Diagnosis not present

## 2014-09-16 DIAGNOSIS — M1712 Unilateral primary osteoarthritis, left knee: Secondary | ICD-10-CM | POA: Diagnosis not present

## 2014-09-23 DIAGNOSIS — M1712 Unilateral primary osteoarthritis, left knee: Secondary | ICD-10-CM | POA: Diagnosis not present

## 2014-09-30 DIAGNOSIS — M1712 Unilateral primary osteoarthritis, left knee: Secondary | ICD-10-CM | POA: Diagnosis not present

## 2014-10-05 DIAGNOSIS — M1812 Unilateral primary osteoarthritis of first carpometacarpal joint, left hand: Secondary | ICD-10-CM | POA: Diagnosis not present

## 2014-11-20 ENCOUNTER — Telehealth: Payer: Self-pay | Admitting: Gastroenterology

## 2014-12-03 DIAGNOSIS — M1712 Unilateral primary osteoarthritis, left knee: Secondary | ICD-10-CM | POA: Diagnosis not present

## 2014-12-03 DIAGNOSIS — M25562 Pain in left knee: Secondary | ICD-10-CM | POA: Diagnosis not present

## 2015-01-05 DIAGNOSIS — K640 First degree hemorrhoids: Secondary | ICD-10-CM | POA: Diagnosis not present

## 2015-01-05 DIAGNOSIS — Z86010 Personal history of colonic polyps: Secondary | ICD-10-CM | POA: Diagnosis not present

## 2015-01-05 DIAGNOSIS — Z09 Encounter for follow-up examination after completed treatment for conditions other than malignant neoplasm: Secondary | ICD-10-CM | POA: Diagnosis not present

## 2015-01-12 DIAGNOSIS — M1812 Unilateral primary osteoarthritis of first carpometacarpal joint, left hand: Secondary | ICD-10-CM | POA: Diagnosis not present

## 2015-01-12 DIAGNOSIS — M1811 Unilateral primary osteoarthritis of first carpometacarpal joint, right hand: Secondary | ICD-10-CM | POA: Diagnosis not present

## 2015-01-26 DIAGNOSIS — M1712 Unilateral primary osteoarthritis, left knee: Secondary | ICD-10-CM | POA: Diagnosis not present

## 2015-02-23 DIAGNOSIS — M1811 Unilateral primary osteoarthritis of first carpometacarpal joint, right hand: Secondary | ICD-10-CM | POA: Diagnosis not present

## 2015-02-23 DIAGNOSIS — M1812 Unilateral primary osteoarthritis of first carpometacarpal joint, left hand: Secondary | ICD-10-CM | POA: Diagnosis not present

## 2015-02-23 DIAGNOSIS — M65312 Trigger thumb, left thumb: Secondary | ICD-10-CM | POA: Diagnosis not present

## 2015-03-23 DIAGNOSIS — M1712 Unilateral primary osteoarthritis, left knee: Secondary | ICD-10-CM | POA: Diagnosis not present

## 2015-03-30 DIAGNOSIS — M65312 Trigger thumb, left thumb: Secondary | ICD-10-CM | POA: Diagnosis not present

## 2015-03-30 DIAGNOSIS — M1811 Unilateral primary osteoarthritis of first carpometacarpal joint, right hand: Secondary | ICD-10-CM | POA: Diagnosis not present

## 2015-03-30 DIAGNOSIS — M1812 Unilateral primary osteoarthritis of first carpometacarpal joint, left hand: Secondary | ICD-10-CM | POA: Diagnosis not present

## 2015-03-30 DIAGNOSIS — M65352 Trigger finger, left little finger: Secondary | ICD-10-CM | POA: Diagnosis not present

## 2015-03-31 DIAGNOSIS — H04203 Unspecified epiphora, bilateral lacrimal glands: Secondary | ICD-10-CM | POA: Diagnosis not present

## 2015-04-23 DIAGNOSIS — Z471 Aftercare following joint replacement surgery: Secondary | ICD-10-CM | POA: Diagnosis not present

## 2015-04-23 DIAGNOSIS — Z96641 Presence of right artificial hip joint: Secondary | ICD-10-CM | POA: Diagnosis not present

## 2015-04-23 DIAGNOSIS — M7071 Other bursitis of hip, right hip: Secondary | ICD-10-CM | POA: Diagnosis not present

## 2015-05-13 DIAGNOSIS — Z96641 Presence of right artificial hip joint: Secondary | ICD-10-CM | POA: Diagnosis not present

## 2015-05-13 DIAGNOSIS — Z471 Aftercare following joint replacement surgery: Secondary | ICD-10-CM | POA: Diagnosis not present

## 2015-05-13 DIAGNOSIS — Z23 Encounter for immunization: Secondary | ICD-10-CM | POA: Diagnosis not present

## 2015-05-17 DIAGNOSIS — I10 Essential (primary) hypertension: Secondary | ICD-10-CM | POA: Diagnosis not present

## 2015-05-28 DIAGNOSIS — M65312 Trigger thumb, left thumb: Secondary | ICD-10-CM | POA: Diagnosis not present

## 2015-05-28 DIAGNOSIS — R208 Other disturbances of skin sensation: Secondary | ICD-10-CM | POA: Diagnosis not present

## 2015-05-28 DIAGNOSIS — M1812 Unilateral primary osteoarthritis of first carpometacarpal joint, left hand: Secondary | ICD-10-CM | POA: Diagnosis not present

## 2015-05-28 DIAGNOSIS — M1811 Unilateral primary osteoarthritis of first carpometacarpal joint, right hand: Secondary | ICD-10-CM | POA: Diagnosis not present

## 2015-06-08 DIAGNOSIS — M1811 Unilateral primary osteoarthritis of first carpometacarpal joint, right hand: Secondary | ICD-10-CM | POA: Diagnosis not present

## 2015-06-08 DIAGNOSIS — R208 Other disturbances of skin sensation: Secondary | ICD-10-CM | POA: Diagnosis not present

## 2015-06-08 DIAGNOSIS — M1712 Unilateral primary osteoarthritis, left knee: Secondary | ICD-10-CM | POA: Diagnosis not present

## 2015-06-08 DIAGNOSIS — M65312 Trigger thumb, left thumb: Secondary | ICD-10-CM | POA: Diagnosis not present

## 2015-06-08 DIAGNOSIS — M1812 Unilateral primary osteoarthritis of first carpometacarpal joint, left hand: Secondary | ICD-10-CM | POA: Diagnosis not present

## 2015-06-22 DIAGNOSIS — M1812 Unilateral primary osteoarthritis of first carpometacarpal joint, left hand: Secondary | ICD-10-CM | POA: Diagnosis not present

## 2015-07-02 DIAGNOSIS — J019 Acute sinusitis, unspecified: Secondary | ICD-10-CM | POA: Diagnosis not present

## 2015-08-05 DIAGNOSIS — M5136 Other intervertebral disc degeneration, lumbar region: Secondary | ICD-10-CM | POA: Diagnosis not present

## 2015-08-05 DIAGNOSIS — M4726 Other spondylosis with radiculopathy, lumbar region: Secondary | ICD-10-CM | POA: Diagnosis not present

## 2015-08-05 DIAGNOSIS — M549 Dorsalgia, unspecified: Secondary | ICD-10-CM | POA: Diagnosis not present

## 2015-08-05 DIAGNOSIS — M5412 Radiculopathy, cervical region: Secondary | ICD-10-CM | POA: Diagnosis not present

## 2015-08-05 DIAGNOSIS — M546 Pain in thoracic spine: Secondary | ICD-10-CM | POA: Diagnosis not present

## 2015-08-05 DIAGNOSIS — Z6828 Body mass index (BMI) 28.0-28.9, adult: Secondary | ICD-10-CM | POA: Diagnosis not present

## 2015-08-05 DIAGNOSIS — M4806 Spinal stenosis, lumbar region: Secondary | ICD-10-CM | POA: Diagnosis not present

## 2015-08-19 DIAGNOSIS — M4806 Spinal stenosis, lumbar region: Secondary | ICD-10-CM | POA: Diagnosis not present

## 2015-09-22 ENCOUNTER — Other Ambulatory Visit: Payer: Self-pay | Admitting: Neurosurgery

## 2015-09-22 DIAGNOSIS — Z6828 Body mass index (BMI) 28.0-28.9, adult: Secondary | ICD-10-CM | POA: Diagnosis not present

## 2015-09-22 DIAGNOSIS — M5126 Other intervertebral disc displacement, lumbar region: Secondary | ICD-10-CM | POA: Diagnosis not present

## 2015-09-22 DIAGNOSIS — M5136 Other intervertebral disc degeneration, lumbar region: Secondary | ICD-10-CM | POA: Diagnosis not present

## 2015-09-22 DIAGNOSIS — R002 Palpitations: Secondary | ICD-10-CM | POA: Diagnosis not present

## 2015-09-22 DIAGNOSIS — M4726 Other spondylosis with radiculopathy, lumbar region: Secondary | ICD-10-CM | POA: Diagnosis not present

## 2015-09-22 DIAGNOSIS — I10 Essential (primary) hypertension: Secondary | ICD-10-CM | POA: Diagnosis not present

## 2015-09-22 DIAGNOSIS — M4806 Spinal stenosis, lumbar region: Secondary | ICD-10-CM | POA: Diagnosis not present

## 2015-09-30 NOTE — Pre-Procedure Instructions (Signed)
    Donna [name]  09/30/2015      [id number] - [place], [place] - [address] AT [facility] OF [address] [place] [address] Phone: [phone] Fax: [phone]    Your procedure is scheduled on Thursday, March 9th, 2017.  Report to [facility] Admitting at 5:30 A.M.   Call this number if you have problems the morning of surgery:  [phone]   Remember:  Do not eat food or drink liquids after midnight.  On Wednesday   Take these medicines the morning of surgery with A SIP OF WATER: Albuterol inhaler if needed (please bring with you), Metoprolol Succinate (Toprol-XL), Omeprazole (Prilosec).  Stop taking: Aspirin, NSAIDs, Aleve, Naproxen, Ibuprofen, Advil, Motrin, BC"s, Goody's, Fish Oil, all herbal medications, and all vitamins.    Do not wear jewelry, make-up or nail polish.  Do not wear lotions, powders, or perfumes.    Do not shave 48 hours prior to surgery.    Do not bring valuables to the hospital.   [facility] is not responsible for any belongings or valuables.  Contacts, dentures or bridgework may not be worn into surgery.  Leave your suitcase in the car.  After surgery it may be brought to your room.  For patients admitted to the hospital, discharge time will be determined by your treatment team.  Patients discharged the day of surgery will not be allowed to drive home.   Special instructions:  See attached.   Please read over the following fact sheets that you were given. Pain Booklet, Coughing and Deep Breathing, MRSA Information and Surgical Site Infection Prevention

## 2015-10-01 ENCOUNTER — Encounter (HOSPITAL_COMMUNITY)
Admission: RE | Admit: 2015-10-01 | Discharge: 2015-10-01 | Disposition: A | Discharge status: Home / Self Care | Payer: 59 | Source: Ambulatory Visit | Attending: Neurosurgery | Admitting: Neurosurgery

## 2015-10-01 ENCOUNTER — Encounter (HOSPITAL_COMMUNITY): Payer: Self-pay

## 2015-10-01 DIAGNOSIS — Z01812 Encounter for preprocedural laboratory examination: Secondary | ICD-10-CM | POA: Diagnosis not present

## 2015-10-01 DIAGNOSIS — Z01818 Encounter for other preprocedural examination: Secondary | ICD-10-CM | POA: Insufficient documentation

## 2015-10-01 DIAGNOSIS — M4806 Spinal stenosis, lumbar region: Secondary | ICD-10-CM | POA: Insufficient documentation

## 2015-10-01 HISTORY — DX: Malignant (primary) neoplasm, unspecified: C80.1

## 2015-10-01 HISTORY — DX: Personal history of other medical treatment: Z92.89

## 2015-10-01 HISTORY — DX: Gastro-esophageal reflux disease without esophagitis: K21.9

## 2015-10-01 HISTORY — DX: Chronic obstructive pulmonary disease, unspecified: J44.9

## 2015-10-01 HISTORY — DX: Unspecified osteoarthritis, unspecified site: M19.90

## 2015-10-01 HISTORY — DX: Personal history of other diseases of the digestive system: Z87.19

## 2015-10-01 LAB — SURGICAL PCR SCREEN
MRSA, PCR: NEGATIVE
Staphylococcus aureus: NEGATIVE

## 2015-10-01 LAB — BASIC METABOLIC PANEL
Anion gap: 9 (ref 5–15)
BUN: 23 mg/dL — ABNORMAL HIGH (ref 6–20)
CO2: 25 mmol/L (ref 22–32)
Calcium: 9.7 mg/dL (ref 8.9–10.3)
Chloride: 105 mmol/L (ref 101–111)
Creatinine, Ser: 1.02 mg/dL — ABNORMAL HIGH (ref 0.44–1.00)
GFR calc Af Amer: >60 mL/min (ref >60)
GFR calc non Af Amer: 54 mL/min — ABNORMAL LOW (ref >60)
Glucose, Bld: 119 mg/dL — ABNORMAL HIGH (ref 65–99)
Potassium: 4.7 mmol/L (ref 3.5–5.1)
Sodium: 139 mmol/L (ref 135–145)

## 2015-10-01 LAB — CBC
HCT: 39.3 % (ref 36.0–46.0)
Hemoglobin: 13 g/dL (ref 12.0–15.0)
MCH: 32.6 pg (ref 26.0–34.0)
MCHC: 33.1 g/dL (ref 30.0–36.0)
MCV: 98.5 fL (ref 78.0–100.0)
Platelets: 223 10*3/uL (ref 150–400)
RBC: 3.99 MIL/uL (ref 3.87–5.11)
RDW: 13.2 % (ref 11.5–15.5)
WBC: 5.4 10*3/uL (ref 4.0–10.5)

## 2015-10-01 NOTE — Progress Notes (Signed)
Eval. With Dr. Einar Gip in the past for heart beating "hard". Have requested- ekg, last OV note, echo via fax. Pt. Reports that she was followed by Dr. Gwenette Greet for allergy related cough, she still has albuterol inhaller that she uses < once per month, pt. Reports that its a postnasal drip that she is suspectible to & she has a dry cough in the a.m.

## 2015-10-04 ENCOUNTER — Encounter: Payer: Self-pay | Admitting: Pulmonary Disease

## 2015-10-04 DIAGNOSIS — R002 Palpitations: Secondary | ICD-10-CM | POA: Diagnosis not present

## 2015-10-04 DIAGNOSIS — M549 Dorsalgia, unspecified: Secondary | ICD-10-CM | POA: Diagnosis not present

## 2015-10-06 ENCOUNTER — Encounter (HOSPITAL_COMMUNITY): Payer: Self-pay | Admitting: Anesthesiology

## 2015-10-06 MED ORDER — CEFAZOLIN SODIUM-DEXTROSE 2-3 GM-% IV SOLR
2.0000 g | INTRAVENOUS | Status: AC
  Administered 2015-10-07: 2 g via INTRAVENOUS

## 2015-10-06 NOTE — Anesthesia Preprocedure Evaluation (Addendum)
Anesthesia Evaluation  Patient identified by MRN, date of birth, ID band Patient awake    Reviewed: Allergy & Precautions, NPO status , Patient's Chart, lab work & pertinent test results, reviewed documented beta blocker date and time   Airway Mallampati: II  TM Distance: >3 FB Neck ROM: Full    Dental  (+) Caps   Pulmonary COPD,  COPD inhaler, former smoker,    Pulmonary exam normal breath sounds clear to auscultation       Cardiovascular hypertension, Pt. on medications and Pt. on home beta blockers Normal cardiovascular exam Rhythm:Regular Rate:Normal     Neuro/Psych  Neuromuscular disease negative psych ROS   GI/Hepatic Neg liver ROS, hiatal hernia, GERD  Medicated and Controlled,  Endo/Other  negative endocrine ROS  Renal/GU negative Renal ROS  negative genitourinary   Musculoskeletal  (+) Arthritis , Osteoarthritis,  Lumbar stenosis with neurogenic claudication   Abdominal   Peds  Hematology   Anesthesia Other Findings   Reproductive/Obstetrics                           Anesthesia Physical Anesthesia Plan  ASA: II  Anesthesia Plan: General   Post-op Pain Management:    Induction: Intravenous  Airway Management Planned: Oral ETT  Additional Equipment:   Intra-op Plan:   Post-operative Plan: Extubation in OR  Informed Consent: I have reviewed the patients History and Physical, chart, labs and discussed the procedure including the risks, benefits and alternatives for the proposed anesthesia with the patient or authorized representative who has indicated his/her understanding and acceptance.   Dental advisory given  Plan Discussed with: CRNA, Anesthesiologist and Surgeon  Anesthesia Plan Comments:        Anesthesia Quick Evaluation

## 2015-10-07 ENCOUNTER — Inpatient Hospital Stay (HOSPITAL_COMMUNITY): Payer: 59

## 2015-10-07 ENCOUNTER — Inpatient Hospital Stay (HOSPITAL_COMMUNITY): Payer: 59 | Admitting: Anesthesiology

## 2015-10-07 ENCOUNTER — Encounter (HOSPITAL_COMMUNITY)
Admission: RE | Disposition: A | Discharge status: Home / Self Care | Payer: Self-pay | Source: Ambulatory Visit | Attending: Neurosurgery

## 2015-10-07 ENCOUNTER — Encounter (HOSPITAL_COMMUNITY): Payer: Self-pay | Admitting: Surgery

## 2015-10-07 ENCOUNTER — Inpatient Hospital Stay (HOSPITAL_COMMUNITY)
Admission: RE | Admit: 2015-10-07 | Discharge: 2015-10-08 | DRG: 520 | Disposition: A | Discharge status: Home / Self Care | Payer: 59 | Source: Ambulatory Visit | Attending: Neurosurgery | Admitting: Neurosurgery

## 2015-10-07 DIAGNOSIS — Z7982 Long term (current) use of aspirin: Secondary | ICD-10-CM

## 2015-10-07 DIAGNOSIS — M5136 Other intervertebral disc degeneration, lumbar region: Secondary | ICD-10-CM | POA: Diagnosis not present

## 2015-10-07 DIAGNOSIS — Z981 Arthrodesis status: Secondary | ICD-10-CM | POA: Diagnosis not present

## 2015-10-07 DIAGNOSIS — M5126 Other intervertebral disc displacement, lumbar region: Secondary | ICD-10-CM | POA: Diagnosis not present

## 2015-10-07 DIAGNOSIS — M4806 Spinal stenosis, lumbar region: Principal | ICD-10-CM | POA: Diagnosis present

## 2015-10-07 DIAGNOSIS — Z419 Encounter for procedure for purposes other than remedying health state, unspecified: Secondary | ICD-10-CM

## 2015-10-07 DIAGNOSIS — Z87891 Personal history of nicotine dependence: Secondary | ICD-10-CM

## 2015-10-07 DIAGNOSIS — Z96641 Presence of right artificial hip joint: Secondary | ICD-10-CM | POA: Diagnosis present

## 2015-10-07 DIAGNOSIS — K219 Gastro-esophageal reflux disease without esophagitis: Secondary | ICD-10-CM | POA: Diagnosis present

## 2015-10-07 DIAGNOSIS — J449 Chronic obstructive pulmonary disease, unspecified: Secondary | ICD-10-CM | POA: Diagnosis not present

## 2015-10-07 DIAGNOSIS — M1990 Unspecified osteoarthritis, unspecified site: Secondary | ICD-10-CM | POA: Diagnosis not present

## 2015-10-07 DIAGNOSIS — M5116 Intervertebral disc disorders with radiculopathy, lumbar region: Secondary | ICD-10-CM | POA: Diagnosis present

## 2015-10-07 DIAGNOSIS — Z7951 Long term (current) use of inhaled steroids: Secondary | ICD-10-CM | POA: Diagnosis not present

## 2015-10-07 DIAGNOSIS — M4726 Other spondylosis with radiculopathy, lumbar region: Secondary | ICD-10-CM | POA: Diagnosis present

## 2015-10-07 DIAGNOSIS — Z888 Allergy status to other drugs, medicaments and biological substances status: Secondary | ICD-10-CM

## 2015-10-07 DIAGNOSIS — M48062 Spinal stenosis, lumbar region with neurogenic claudication: Secondary | ICD-10-CM | POA: Diagnosis present

## 2015-10-07 DIAGNOSIS — M47816 Spondylosis without myelopathy or radiculopathy, lumbar region: Secondary | ICD-10-CM | POA: Diagnosis not present

## 2015-10-07 DIAGNOSIS — I10 Essential (primary) hypertension: Secondary | ICD-10-CM | POA: Diagnosis present

## 2015-10-07 DIAGNOSIS — Z79899 Other long term (current) drug therapy: Secondary | ICD-10-CM | POA: Diagnosis not present

## 2015-10-07 HISTORY — PX: LUMBAR LAMINECTOMY/DECOMPRESSION MICRODISCECTOMY: SHX5026

## 2015-10-07 SURGERY — LUMBAR LAMINECTOMY/DECOMPRESSION MICRODISCECTOMY 2 LEVELS
Anesthesia: General | Site: Back | Laterality: Left

## 2015-10-07 MED ORDER — KETOROLAC TROMETHAMINE 15 MG/ML IJ SOLN
INTRAMUSCULAR | Status: AC
  Administered 2015-10-07: 15 mg
  Filled 2015-10-07: qty 1

## 2015-10-07 MED ORDER — ARTIFICIAL TEARS OP OINT
TOPICAL_OINTMENT | OPHTHALMIC | Status: DC | PRN
  Administered 2015-10-07: 1 via OPHTHALMIC

## 2015-10-07 MED ORDER — DEXTROSE-NACL 5-0.45 % IV SOLN
INTRAVENOUS | Status: DC
Start: 2015-10-07 — End: 2015-10-08

## 2015-10-07 MED ORDER — BISACODYL 10 MG RE SUPP: 10.0000 mg | Freq: Every day | RECTAL | Status: DC | PRN

## 2015-10-07 MED ORDER — MENTHOL 3 MG MT LOZG: 1.0000 | LOZENGE | OROMUCOSAL | Status: DC | PRN

## 2015-10-07 MED ORDER — 0.9 % SODIUM CHLORIDE (POUR BTL) OPTIME
TOPICAL | Status: DC | PRN
  Administered 2015-10-07 (×2): 1000 mL

## 2015-10-07 MED ORDER — MEPERIDINE HCL 25 MG/ML IJ SOLN: 6.2500 mg | INTRAMUSCULAR | Status: DC | PRN

## 2015-10-07 MED ORDER — SUCCINYLCHOLINE CHLORIDE 20 MG/ML IJ SOLN
INTRAMUSCULAR | Status: AC
  Filled 2015-10-07: qty 1

## 2015-10-07 MED ORDER — NEOSTIGMINE METHYLSULFATE 10 MG/10ML IV SOLN
INTRAVENOUS | Status: DC | PRN
  Administered 2015-10-07: 3 mg via INTRAVENOUS

## 2015-10-07 MED ORDER — ACETAMINOPHEN 10 MG/ML IV SOLN
INTRAVENOUS | Status: DC | PRN
  Administered 2015-10-07: 1000 mg via INTRAVENOUS

## 2015-10-07 MED ORDER — SODIUM CHLORIDE 0.9 % IV SOLN: 250.0000 mL | INTRAVENOUS | Status: DC

## 2015-10-07 MED ORDER — THROMBIN 20000 UNITS EX SOLR
CUTANEOUS | Status: DC | PRN
  Administered 2015-10-07: 09:00:00 via TOPICAL

## 2015-10-07 MED ORDER — PHENOL 1.4 % MT LIQD: 1.0000 | OROMUCOSAL | Status: DC | PRN

## 2015-10-07 MED ORDER — EPHEDRINE SULFATE 50 MG/ML IJ SOLN
INTRAMUSCULAR | Status: AC
  Filled 2015-10-07: qty 1

## 2015-10-07 MED ORDER — THROMBIN 5000 UNITS EX SOLR
OROMUCOSAL | Status: DC | PRN
  Administered 2015-10-07: 09:00:00 via TOPICAL

## 2015-10-07 MED ORDER — CYCLOBENZAPRINE HCL 10 MG PO TABS
10.0000 mg | ORAL_TABLET | Freq: Three times a day (TID) | ORAL | Status: DC | PRN
  Administered 2015-10-07: 10 mg via ORAL

## 2015-10-07 MED ORDER — ROCURONIUM BROMIDE 100 MG/10ML IV SOLN
INTRAVENOUS | Status: DC | PRN
  Administered 2015-10-07: 40 mg via INTRAVENOUS
  Administered 2015-10-07 (×3): 10 mg via INTRAVENOUS

## 2015-10-07 MED ORDER — OXYCODONE-ACETAMINOPHEN 5-325 MG PO TABS
1.0000 | ORAL_TABLET | ORAL | Status: DC | PRN
  Administered 2015-10-07: 2 via ORAL
  Filled 2015-10-07: qty 2

## 2015-10-07 MED ORDER — HYDROMORPHONE HCL 1 MG/ML IJ SOLN
INTRAMUSCULAR | Status: AC
  Administered 2015-10-07: 0.5 mg via INTRAVENOUS
  Filled 2015-10-07: qty 1

## 2015-10-07 MED ORDER — MIDAZOLAM HCL 5 MG/5ML IJ SOLN
INTRAMUSCULAR | Status: DC | PRN
  Administered 2015-10-07: 2 mg via INTRAVENOUS

## 2015-10-07 MED ORDER — METOPROLOL SUCCINATE ER 100 MG PO TB24
100.0000 mg | ORAL_TABLET | Freq: Every day | ORAL | Status: DC
  Filled 2015-10-07 (×2): qty 1

## 2015-10-07 MED ORDER — LIDOCAINE-EPINEPHRINE 1 %-1:100000 IJ SOLN
INTRAMUSCULAR | Status: DC | PRN
  Administered 2015-10-07: 15 mL

## 2015-10-07 MED ORDER — HYDROMORPHONE HCL 1 MG/ML IJ SOLN
0.2500 mg | INTRAMUSCULAR | Status: DC | PRN
  Administered 2015-10-07 (×4): 0.5 mg via INTRAVENOUS

## 2015-10-07 MED ORDER — MORPHINE SULFATE (PF) 4 MG/ML IV SOLN: 4.0000 mg | INTRAVENOUS | Status: DC | PRN

## 2015-10-07 MED ORDER — EPHEDRINE SULFATE 50 MG/ML IJ SOLN
INTRAMUSCULAR | Status: DC | PRN
  Administered 2015-10-07: 5 mg via INTRAVENOUS

## 2015-10-07 MED ORDER — BUPIVACAINE HCL (PF) 0.5 % IJ SOLN
INTRAMUSCULAR | Status: DC | PRN
  Administered 2015-10-07: 15 mL

## 2015-10-07 MED ORDER — MAGNESIUM HYDROXIDE 400 MG/5ML PO SUSP: 30.0000 mL | Freq: Every day | ORAL | Status: DC | PRN

## 2015-10-07 MED ORDER — ALUM & MAG HYDROXIDE-SIMETH 200-200-20 MG/5ML PO SUSP: 30.0000 mL | Freq: Four times a day (QID) | ORAL | Status: DC | PRN

## 2015-10-07 MED ORDER — CYCLOBENZAPRINE HCL 10 MG PO TABS
ORAL_TABLET | ORAL | Status: AC
  Administered 2015-10-07: 10 mg via ORAL
  Filled 2015-10-07: qty 1

## 2015-10-07 MED ORDER — ALBUTEROL SULFATE (2.5 MG/3ML) 0.083% IN NEBU: 3.0000 mL | INHALATION_SOLUTION | RESPIRATORY_TRACT | Status: DC | PRN

## 2015-10-07 MED ORDER — ONDANSETRON HCL 4 MG/2ML IJ SOLN: 4.0000 mg | Freq: Four times a day (QID) | INTRAMUSCULAR | Status: DC | PRN

## 2015-10-07 MED ORDER — FENTANYL CITRATE (PF) 100 MCG/2ML IJ SOLN
INTRAMUSCULAR | Status: DC | PRN
  Administered 2015-10-07: 150 ug via INTRAVENOUS
  Administered 2015-10-07 (×2): 50 ug via INTRAVENOUS

## 2015-10-07 MED ORDER — KETOROLAC TROMETHAMINE 30 MG/ML IJ SOLN: 15.0000 mg | Freq: Once | INTRAMUSCULAR | Status: DC

## 2015-10-07 MED ORDER — NEOSTIGMINE METHYLSULFATE 10 MG/10ML IV SOLN
INTRAVENOUS | Status: AC
  Filled 2015-10-07: qty 6

## 2015-10-07 MED ORDER — ACETAMINOPHEN 650 MG RE SUPP: 650.0000 mg | RECTAL | Status: DC | PRN

## 2015-10-07 MED ORDER — SODIUM CHLORIDE 0.9% FLUSH
3.0000 mL | Freq: Two times a day (BID) | INTRAVENOUS | Status: DC
  Administered 2015-10-07: 3 mL via INTRAVENOUS

## 2015-10-07 MED ORDER — ROCURONIUM BROMIDE 50 MG/5ML IV SOLN
INTRAVENOUS | Status: AC
  Filled 2015-10-07: qty 1

## 2015-10-07 MED ORDER — ONDANSETRON HCL 4 MG/2ML IJ SOLN
INTRAMUSCULAR | Status: DC | PRN
  Administered 2015-10-07: 4 mg via INTRAVENOUS

## 2015-10-07 MED ORDER — ZOLPIDEM TARTRATE 5 MG PO TABS: 5.0000 mg | ORAL_TABLET | Freq: Every evening | ORAL | Status: DC | PRN

## 2015-10-07 MED ORDER — ACETAMINOPHEN 325 MG PO TABS: 650.0000 mg | ORAL_TABLET | ORAL | Status: DC | PRN

## 2015-10-07 MED ORDER — KETOROLAC TROMETHAMINE 30 MG/ML IJ SOLN
15.0000 mg | Freq: Four times a day (QID) | INTRAMUSCULAR | Status: DC
  Administered 2015-10-07 – 2015-10-08 (×3): 15 mg via INTRAVENOUS
  Filled 2015-10-07 (×3): qty 1

## 2015-10-07 MED ORDER — LOSARTAN POTASSIUM 50 MG PO TABS: 50.0000 mg | ORAL_TABLET | Freq: Every day | ORAL | Status: DC

## 2015-10-07 MED ORDER — LIDOCAINE HCL (CARDIAC) 20 MG/ML IV SOLN
INTRAVENOUS | Status: DC | PRN
  Administered 2015-10-07: 60 mg via INTRAVENOUS

## 2015-10-07 MED ORDER — SODIUM CHLORIDE 0.9 % IR SOLN
Status: DC | PRN
  Administered 2015-10-07: 08:00:00

## 2015-10-07 MED ORDER — DEXAMETHASONE SODIUM PHOSPHATE 4 MG/ML IJ SOLN
INTRAMUSCULAR | Status: AC
  Filled 2015-10-07: qty 2

## 2015-10-07 MED ORDER — CEFAZOLIN SODIUM-DEXTROSE 2-3 GM-% IV SOLR
INTRAVENOUS | Status: AC
  Filled 2015-10-07: qty 50

## 2015-10-07 MED ORDER — GLYCOPYRROLATE 0.2 MG/ML IJ SOLN
INTRAMUSCULAR | Status: DC | PRN
  Administered 2015-10-07: 0.4 mg via INTRAVENOUS

## 2015-10-07 MED ORDER — ONDANSETRON HCL 4 MG PO TABS: 4.0000 mg | ORAL_TABLET | Freq: Four times a day (QID) | ORAL | Status: DC | PRN

## 2015-10-07 MED ORDER — HYDROCODONE-ACETAMINOPHEN 5-325 MG PO TABS: 1.0000 | ORAL_TABLET | ORAL | Status: DC | PRN

## 2015-10-07 MED ORDER — HYDROXYZINE HCL 50 MG/ML IM SOLN: 50.0000 mg | INTRAMUSCULAR | Status: DC | PRN

## 2015-10-07 MED ORDER — LACTATED RINGERS IV SOLN
INTRAVENOUS | Status: DC | PRN
  Administered 2015-10-07 (×2): via INTRAVENOUS

## 2015-10-07 MED ORDER — HYDROXYZINE HCL 25 MG PO TABS: 50.0000 mg | ORAL_TABLET | ORAL | Status: DC | PRN

## 2015-10-07 MED ORDER — PROPOFOL 10 MG/ML IV BOLUS
INTRAVENOUS | Status: AC
  Filled 2015-10-07: qty 20

## 2015-10-07 MED ORDER — ONDANSETRON HCL 4 MG/2ML IJ SOLN
INTRAMUSCULAR | Status: AC
  Filled 2015-10-07: qty 2

## 2015-10-07 MED ORDER — FENTANYL CITRATE (PF) 250 MCG/5ML IJ SOLN
INTRAMUSCULAR | Status: AC
  Filled 2015-10-07: qty 5

## 2015-10-07 MED ORDER — LIDOCAINE HCL (CARDIAC) 20 MG/ML IV SOLN
INTRAVENOUS | Status: AC
  Filled 2015-10-07: qty 5

## 2015-10-07 MED ORDER — PROPOFOL 10 MG/ML IV BOLUS
INTRAVENOUS | Status: DC | PRN
  Administered 2015-10-07: 140 mg via INTRAVENOUS

## 2015-10-07 MED ORDER — DEXAMETHASONE SODIUM PHOSPHATE 4 MG/ML IJ SOLN
INTRAMUSCULAR | Status: DC | PRN
  Administered 2015-10-07: 8 mg via INTRAVENOUS

## 2015-10-07 MED ORDER — ARTIFICIAL TEARS OP OINT
TOPICAL_OINTMENT | OPHTHALMIC | Status: AC
  Filled 2015-10-07: qty 3.5

## 2015-10-07 MED ORDER — SODIUM CHLORIDE 0.9% FLUSH: 3.0000 mL | INTRAVENOUS | Status: DC | PRN

## 2015-10-07 MED ORDER — GLYCOPYRROLATE 0.2 MG/ML IJ SOLN
INTRAMUSCULAR | Status: AC
  Filled 2015-10-07: qty 2

## 2015-10-07 MED ORDER — METOCLOPRAMIDE HCL 5 MG/ML IJ SOLN: 10.0000 mg | Freq: Once | INTRAMUSCULAR | Status: DC | PRN

## 2015-10-07 MED ORDER — MIDAZOLAM HCL 2 MG/2ML IJ SOLN
INTRAMUSCULAR | Status: AC
  Filled 2015-10-07: qty 2

## 2015-10-07 SURGICAL SUPPLY — 63 items
ADH SKN CLS APL DERMABOND .7 (GAUZE/BANDAGES/DRESSINGS) ×1
APL SKNCLS STERI-STRIP NONHPOA (GAUZE/BANDAGES/DRESSINGS)
BAG DECANTER FOR FLEXI CONT (MISCELLANEOUS) ×2 IMPLANT
BENZOIN TINCTURE PRP APPL 2/3 (GAUZE/BANDAGES/DRESSINGS) IMPLANT
BLADE CLIPPER SURG (BLADE) IMPLANT
BRUSH SCRUB EZ PLAIN DRY (MISCELLANEOUS) ×2 IMPLANT
BUR ACORN 6.0 ACORN (BURR) IMPLANT
BUR ACRON 5.0MM COATED (BURR) ×1 IMPLANT
BUR MATCHSTICK NEURO 3.0 LAGG (BURR) ×2 IMPLANT
CANISTER SUCT 3000ML PPV (MISCELLANEOUS) ×2 IMPLANT
DERMABOND ADVANCED (GAUZE/BANDAGES/DRESSINGS) ×1
DERMABOND ADVANCED .7 DNX12 (GAUZE/BANDAGES/DRESSINGS) IMPLANT
DRAPE LAPAROTOMY 100X72X124 (DRAPES) ×2 IMPLANT
DRAPE MICROSCOPE LEICA (MISCELLANEOUS) ×2 IMPLANT
DRAPE POUCH INSTRU U-SHP 10X18 (DRAPES) ×2 IMPLANT
DRSG EMULSION OIL 3X3 NADH (GAUZE/BANDAGES/DRESSINGS) IMPLANT
ELECT REM PT RETURN 9FT ADLT (ELECTROSURGICAL) ×2
ELECTRODE REM PT RTRN 9FT ADLT (ELECTROSURGICAL) ×1 IMPLANT
GAUZE SPONGE 4X4 12PLY STRL (GAUZE/BANDAGES/DRESSINGS) ×1 IMPLANT
GAUZE SPONGE 4X4 16PLY XRAY LF (GAUZE/BANDAGES/DRESSINGS) IMPLANT
GLOVE BIOGEL PI IND STRL 7.0 (GLOVE) IMPLANT
GLOVE BIOGEL PI IND STRL 8 (GLOVE) ×1 IMPLANT
GLOVE BIOGEL PI INDICATOR 7.0 (GLOVE) ×1
GLOVE BIOGEL PI INDICATOR 8 (GLOVE) ×3
GLOVE ECLIPSE 7.5 STRL STRAW (GLOVE) ×5 IMPLANT
GLOVE EXAM NITRILE LRG STRL (GLOVE) IMPLANT
GLOVE EXAM NITRILE MD LF STRL (GLOVE) IMPLANT
GLOVE EXAM NITRILE XL STR (GLOVE) IMPLANT
GLOVE EXAM NITRILE XS STR PU (GLOVE) IMPLANT
GLOVE SS N UNI LF 6.5 STRL (GLOVE) ×3 IMPLANT
GOWN STRL REUS W/ TWL LRG LVL3 (GOWN DISPOSABLE) ×1 IMPLANT
GOWN STRL REUS W/ TWL XL LVL3 (GOWN DISPOSABLE) IMPLANT
GOWN STRL REUS W/TWL 2XL LVL3 (GOWN DISPOSABLE) ×1 IMPLANT
GOWN STRL REUS W/TWL LRG LVL3 (GOWN DISPOSABLE)
GOWN STRL REUS W/TWL XL LVL3 (GOWN DISPOSABLE) ×6
HEMOSTAT POWDER KIT SURGIFOAM (HEMOSTASIS) ×1 IMPLANT
KIT BASIN OR (CUSTOM PROCEDURE TRAY) ×2 IMPLANT
KIT ROOM TURNOVER OR (KITS) ×2 IMPLANT
NDL HYPO 18GX1.5 BLUNT FILL (NEEDLE) IMPLANT
NDL SPNL 18GX3.5 QUINCKE PK (NEEDLE) ×1 IMPLANT
NDL SPNL 22GX3.5 QUINCKE BK (NEEDLE) ×1 IMPLANT
NEEDLE HYPO 18GX1.5 BLUNT FILL (NEEDLE) IMPLANT
NEEDLE SPNL 18GX3.5 QUINCKE PK (NEEDLE) ×4 IMPLANT
NEEDLE SPNL 22GX3.5 QUINCKE BK (NEEDLE) ×4 IMPLANT
NS IRRIG 1000ML POUR BTL (IV SOLUTION) ×3 IMPLANT
PACK LAMINECTOMY NEURO (CUSTOM PROCEDURE TRAY) ×2 IMPLANT
PAD ARMBOARD 7.5X6 YLW CONV (MISCELLANEOUS) ×6 IMPLANT
PATTIES SURGICAL .5 X1 (DISPOSABLE) ×2 IMPLANT
PATTIES SURGICAL 1X1 (DISPOSABLE) ×1 IMPLANT
RUBBERBAND STERILE (MISCELLANEOUS) ×4 IMPLANT
SPONGE LAP 4X18 X RAY DECT (DISPOSABLE) IMPLANT
SPONGE SURGIFOAM ABS GEL 100 (HEMOSTASIS) ×2 IMPLANT
STRIP CLOSURE SKIN 1/2X4 (GAUZE/BANDAGES/DRESSINGS) IMPLANT
SUT PROLENE 6 0 BV (SUTURE) IMPLANT
SUT VIC AB 1 CT1 18XBRD ANBCTR (SUTURE) ×1 IMPLANT
SUT VIC AB 1 CT1 8-18 (SUTURE) ×4
SUT VIC AB 2-0 CP2 18 (SUTURE) ×3 IMPLANT
SUT VIC AB 3-0 SH 8-18 (SUTURE) ×1 IMPLANT
SYR 5ML LL (SYRINGE) IMPLANT
TAPE CLOTH SURG 4X10 WHT LF (GAUZE/BANDAGES/DRESSINGS) ×1 IMPLANT
TOWEL OR 17X24 6PK STRL BLUE (TOWEL DISPOSABLE) ×2 IMPLANT
TOWEL OR 17X26 10 PK STRL BLUE (TOWEL DISPOSABLE) ×2 IMPLANT
WATER STERILE IRR 1000ML POUR (IV SOLUTION) ×2 IMPLANT

## 2015-10-07 NOTE — H&P (Signed)
Subjective: Patient is a 70 y.o. 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  Patient's been having difficulties with pain in the low back rating down to the lower extremities. He received a been primarily through the right lower 70, but more recently has been significantly through the left lower extremity. Patient has not improved with nonsurgical measures, and is therefore now admitted for lumbar decompression including an L3-L5 decompressive lumbar laminectomy, and a left L4-5 micro-discectomy.    Patient Active Problem List   Diagnosis Date Noted  . COPD (chronic obstructive pulmonary disease) ([place]) 01/20/2011   Past Medical History  Diagnosis Date  . Hypertension   . Hiatal hernia   . Hypertension     currently followed for BP / heart management with Dr. Leanora Ivanoff, but prev. saw Dr. Einar Gip  . H/O echocardiogram   . COPD (chronic obstructive pulmonary disease) ([place])   . GERD (gastroesophageal reflux disease)   . History of hiatal hernia   . Arthritis     lumbar, toes, L knee, cerv. spine & hands   . Cancer ([place])     basal cell removed fr. face     Past Surgical History  Procedure Laterality Date  . Cervical spine surgery  1992    fusion  . Breast enhancement surgery    . Total hip arthroplasty  2007    Right  . Back surgery  1994    cerv. spine fusion   . Joint replacement Right 2007  . Tonsillectomy    . Tubal ligation    . Breast surgery  1980's    implants- bilateral     Prescriptions prior to admission  Medication Sig Dispense Refill Last Dose  . albuterol (PROVENTIL HFA;VENTOLIN HFA) 108 (90 Base) MCG/ACT inhaler Inhale 2 puffs into the lungs every 4 (four) hours as needed for wheezing or shortness of breath.   10/07/2015 at 0500  . aspirin 81 MG tablet Take 81 mg by mouth daily.   Past Week at Unknown time  . Cholecalciferol (VITAMIN D3 PO) Take 2,400 Units by mouth  daily.   Past Week at Unknown time  . Coenzyme Q10 (COQ-10) 100 MG CAPS Take 1 capsule by mouth daily.   Past Week at Unknown time  . Ginger, Zingiber officinalis, (GINGER ROOT PO) Take 1 capsule by mouth daily.   Past Week at Unknown time  . HYDROcodone-acetaminophen (NORCO/VICODIN) 5-325 MG tablet Take 1 tablet by mouth every 8 (eight) hours as needed.  0 Past Week at Unknown time  . losartan (COZAAR) 50 MG tablet Take 50 mg by mouth daily before breakfast.    10/06/2015 at Unknown time  . magnesium gluconate (MAGONATE) 500 MG tablet Take 500 mg by mouth daily.   Past Week at Unknown time  . metoprolol succinate (TOPROL-XL) 100 MG 24 hr tablet Take 100 mg by mouth daily after breakfast. Take with or immediately following a meal.   10/07/2015 at 0500  . Multiple Vitamins-Minerals (MULTIVITAMIN WOMEN) TABS Take 1 tablet by mouth daily.   Past Week at Unknown time  . omeprazole (PRILOSEC) 20 MG capsule Take 20 mg by mouth daily before breakfast.    10/07/2015 at 0500  . Turmeric Curcumin 500 MG CAPS Take 1,000 mg by mouth daily.   Past Week at Unknown time  . vitamin B-12 (CYANOCOBALAMIN) 1000 MCG tablet Take 1,000 mcg by mouth daily.   Past Week at Unknown time  Allergies  Allergen Reactions  . Thorazine [Chlorpromazine] Anaphylaxis    Social History  Substance Use Topics  . Smoking status: Former Smoker -- 0.70 packs/day for 24 years    Types: Cigarettes    Quit date: 07/31/2009  . Smokeless tobacco: Former Systems developer    Quit date: 10/01/1998  . Alcohol Use: Yes     Comment: wine- almost daily    Family History  Problem Relation Age of Onset  . Heart disease Maternal Aunt   . Heart disease Maternal Aunt   . Lung cancer Sister   . Kidney cancer Mother   . Kidney cancer Maternal Uncle   . Kidney cancer Maternal Aunt      Review of Systems A comprehensive review of systems was negative.  Objective: Vital signs in last 24 hours: Temp:  [98 F (36.7 C)] 98 F (36.7 C) (03/09 0629) Pulse  Rate:  [73] 73 (03/09 0629) Resp:  [20] 20 (03/09 0629) BP: (153)/(75) 153/75 mmHg (03/09 0629) SpO2:  [96 %] 96 % (03/09 0629)  EXAM: Patient well-developed well-nourished white female in no acute distress. Lungs are clear to auscultation , the patient has symmetrical respiratory excursion. Heart has a regular rate and rhythm normal S1 and S2 no murmur.   Abdomen is soft nontender nondistended bowel sounds are present. Extremity examination shows no clubbing cyanosis or edema. Motor examination shows 5 over 5 strength in the lower extremities including the iliopsoas quadriceps dorsiflexor extensor hallicus  longus and plantar flexor bilaterally. Sensation is intact to pinprick in the distal lower extremities. Reflexes are symmetrical bilaterally. No pathologic reflexes are present. Patient has a normal gait and stance.   Data Review:CBC    Component Value Date/Time   WBC 5.4 10/01/2015 1029   RBC 3.99 10/01/2015 1029   HGB 13.0 10/01/2015 1029   HCT 39.3 10/01/2015 1029   PLT 223 10/01/2015 1029   MCV 98.5 10/01/2015 1029   MCH 32.6 10/01/2015 1029   MCHC 33.1 10/01/2015 1029   RDW 13.2 10/01/2015 1029                          BMET    Component Value Date/Time   NA 139 10/01/2015 1029   K 4.7 10/01/2015 1029   CL 105 10/01/2015 1029   CO2 25 10/01/2015 1029   GLUCOSE 119* 10/01/2015 1029   BUN 23* 10/01/2015 1029   CREATININE 1.02* 10/01/2015 1029   CALCIUM 9.7 10/01/2015 1029   GFRNONAA 54* 10/01/2015 1029   GFRAA >60 10/01/2015 1029     Assessment/Plan: Patient was low back and bilateral lower extremity pain, currently worse than the left lower extremity as compared to the right lower extremity. She is admitted now for an L3-L5 decompressive lumbar laminectomy and left L4-5 microdiscectomy.  I've discussed with the patient the nature of his condition, the nature the surgical procedure, the typical length of surgery, hospital stay, and overall recuperation. We discussed  limitations postoperatively. I discussed risks of surgery including risks of infection, bleeding, possibly need for transfusion, the risk of nerve root dysfunction with pain, weakness, numbness, or paresthesias, or risk of dural tear and CSF leakage and possible need for further surgery, the risk of recurrent disc herniation and the possible need for further surgery, and the risk of anesthetic complications including myocardial infarction, stroke, pneumonia, and death. Understanding all this the patient does wish to proceed with surgery and is admitted for such.    Hosie Spangle,  MD 10/07/2015 7:13 AM

## 2015-10-07 NOTE — Op Note (Signed)
10/07/2015  10:01 AM  PATIENT:  Donna [name]  71 y.o. 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  ANESTHESIA:   general  EBL:  Total I/O In: 1300 [I.V.:1300] Out: 50 [Blood:50]  BLOOD ADMINISTERED:none  COUNT: Correct per nursing staff  DICTATION: Patient was brought to the operating room placed under general endotracheal anesthesia. Patient was turned to a prone position the lumbar region was prepped with Betadine soap and solution and draped in a sterile fashion. The midline was infiltrated with local anesthetic with epinephrine. A localizing x-ray was taken and then a midline incision was made carried down thru the subcutaneous tissue, bipolar cautery and electrocautery were used to maintain hemostasis. Dissection was carried down to the lumbar fascia which was incised bilaterally and the paraspinal muscles were dissected from the spinous process and lamina in a subperiosteal fashion. Another localizing x-ray was taken and the L3, L4, and L5 levels were identified. Laminectomy was begun with double-action rongeurs a high-speed drill and Kerrison punches. Bony surfaces were waxed as needed to maintain hemostasis. The thickened ligamentum flavum was carefully removed. Dissection was carried laterally,  performing medial facetectomies, to decompress the lateral stenosis taking care to leave the facet complexes intact. The operating microscope was then draped and brought in the field provided additional magnification, illumination, and visualization, and the microdiscectomy was performed using microdissection and microsurgical technique. The left L4-5 subligamentous disc herniation was identified. The remaining annular fibers were incised and the disc herniation mobilized, and the thecal sac and exiting left L5 nerve root were further decompressed. Hemostasis was established the use of bipolar cautery. Once the decompression was completed hemostasis was established with the Surgifoam. Paraspinal muscles, deep fascia, and Scarpa's fascia were closed in separate layers with interrupted 1 undyed Vicryl sutures. The subcutaneous and subcuticular were closed with interrupted inverted 2-0 undyed Vicryl sutures. Skin edges were approximated with Dermabond. The wound was dressed with sterile gauze and Hypafix. Following surgery the patient is to be turned back to a supine position, reversed from the anesthetic, extubated, and transferred to the recovery room for further care.  PLAN OF CARE: Admit to inpatient   PATIENT DISPOSITION:  PACU - hemodynamically stable.   Delay start of Pharmacological VTE agent (>24hrs) due to surgical blood loss or risk of bleeding:  yes

## 2015-10-07 NOTE — Anesthesia Procedure Notes (Signed)
Procedure Name: Intubation Date/Time: 10/07/2015 7:36 AM Performed by: Garrison Columbus T Pre-anesthesia Checklist: Patient identified, Emergency Drugs available, Suction available and Patient being monitored Patient Re-evaluated:Patient Re-evaluated prior to inductionOxygen Delivery Method: Circle system utilized Preoxygenation: Pre-oxygenation with 100% oxygen Intubation Type: IV induction Ventilation: Mask ventilation without difficulty Laryngoscope Size: Miller and 2 Grade View: Grade I Tube type: Oral Tube size: 7.0 mm Number of attempts: 1 Airway Equipment and Method: Stylet Placement Confirmation: ETT inserted through vocal cords under direct vision,  positive ETCO2 and breath sounds checked- equal and bilateral Secured at: 22 cm Tube secured with: Tape Dental Injury: Teeth and Oropharynx as per pre-operative assessment

## 2015-10-07 NOTE — Progress Notes (Signed)
Filed Vitals:   10/07/15 1230 10/07/15 1257 10/07/15 1300 10/07/15 1315  BP: 123/70 131/70  151/66  Pulse: 63 56  77  Temp:   97.5 F (36.4 C) 97.8 F (36.6 C)  TempSrc:    Oral  Resp: 13 14  18   SpO2: 96% 94%  96%    Patient resting in bed. Has been up and walking in the halls with a staff. Has voided. Dressing clean and dry. Moving all extremities well. Little if any incisional pain. Good relief of preoperative radicular pain.  Plan: Encouraged to ambulate in the halls with staff. Will continue to progress to postoperative recovery.  Hosie Spangle, MD 10/07/2015, 5:19 PM

## 2015-10-07 NOTE — Transfer of Care (Signed)
Immediate Anesthesia Transfer of Care Note  Patient: Donna [name]  Procedure(s) Performed: Procedure(s): Lumbar three-five Decompressive lumbar Laminectomy & Left Lumbar four-five Microdiscectomy (Left)  Patient Location: PACU  Anesthesia Type:General  Level of Consciousness: awake, alert  and oriented  Airway & Oxygen Therapy: Patient Spontanous Breathing  Post-op Assessment: Report given to RN, Post -op Vital signs reviewed and stable and Patient moving all extremities X 4  Post vital signs: Reviewed and stable  Last Vitals:  Filed Vitals:   10/07/15 0629  BP: 153/75  Pulse: 73  Temp: 36.7 C  Resp: 20    Complications: No apparent anesthesia complications

## 2015-10-07 NOTE — Anesthesia Postprocedure Evaluation (Signed)
Anesthesia Post Note  Patient: KHALIYAH BURROUS  Procedure(s) Performed: Procedure(s) (LRB): Lumbar three-five Decompressive lumbar Laminectomy & Left Lumbar four-five Microdiscectomy (Left)  Patient location during evaluation: PACU Anesthesia Type: General Level of consciousness: awake and alert Pain management: pain level controlled Vital Signs Assessment: post-procedure vital signs reviewed and stable Respiratory status: spontaneous breathing, nonlabored ventilation, respiratory function stable and patient connected to nasal cannula oxygen Cardiovascular status: blood pressure returned to baseline and stable Postop Assessment: no signs of nausea or vomiting Anesthetic complications: no    Last Vitals:  Filed Vitals:   10/07/15 1030 10/07/15 1045  BP: 140/64 139/63  Pulse: 63 57  Temp:    Resp: 17 12    Last Pain:  Filed Vitals:   10/07/15 1059  PainSc: 6                  Reniah Cottingham A.

## 2015-10-08 ENCOUNTER — Encounter (HOSPITAL_COMMUNITY): Payer: Self-pay | Admitting: Neurosurgery

## 2015-10-08 MED ORDER — HYDROCODONE-ACETAMINOPHEN 5-325 MG PO TABS: 1.0000 | ORAL_TABLET | ORAL | Status: DC | PRN

## 2015-10-08 NOTE — Discharge Summary (Signed)
Physician Discharge Summary  Patient ID: Donna [name] MRN: [id number] DOB/AGE: [birth year] 70 y.o.  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 Postoperatively she's had excellent relief of her back and radicular pain. She is up and ambulate actively. Her dressing was removed, and her incision is healing nicely. There is no ecchymosis, erythema, swelling, or drainage. We are discharging her to home with instructions regarding wound care and activities. She is scheduled to follow-up with me in 3 weeks.  Discharge Exam: Blood pressure 127/50, pulse 63, temperature 98.6 F (37 C), temperature source Oral, resp. rate 18, SpO2 94 %.  Disposition:  Home     Medication List    TAKE these medications        albuterol 108 (90 Base) MCG/ACT inhaler  Commonly known as:  PROVENTIL HFA;VENTOLIN HFA  Inhale 2 puffs into the lungs every 4 (four) hours as needed for wheezing or shortness of breath.     aspirin 81 MG tablet  Take 81 mg by mouth daily.     CoQ-10 100 MG Caps  Take 1 capsule by mouth daily.     GINGER ROOT PO  Take 1 capsule by mouth daily.     HYDROcodone-acetaminophen 5-325 MG tablet  Commonly known as:  NORCO/VICODIN  Take 1 tablet by mouth every 8 (eight) hours as needed.     HYDROcodone-acetaminophen 5-325 MG tablet  Commonly known as:  NORCO/VICODIN  Take 1-2 tablets by mouth every 4  (four) hours as needed (pain).     losartan 50 MG tablet  Commonly known as:  COZAAR  Take 50 mg by mouth daily before breakfast.     magnesium gluconate 500 MG tablet  Commonly known as:  MAGONATE  Take 500 mg by mouth daily.     metoprolol succinate 100 MG 24 hr tablet  Commonly known as:  TOPROL-XL  Take 100 mg by mouth daily after breakfast. Take with or immediately following a meal.     MULTIVITAMIN WOMEN Tabs  Take 1 tablet by mouth daily.     omeprazole 20 MG capsule  Commonly known as:  PRILOSEC  Take 20 mg by mouth daily before breakfast.     Turmeric Curcumin 500 MG Caps  Take 1,000 mg by mouth daily.     vitamin B-12 1000 MCG tablet  Commonly known as:  CYANOCOBALAMIN  Take 1,000 mcg by mouth daily.     VITAMIN D3 PO  Take 2,400 Units by mouth daily.         SignedHosie Spangle 10/08/2015, 7:33 AM

## 2015-10-08 NOTE — Discharge Instructions (Signed)

## 2015-10-08 NOTE — Progress Notes (Signed)
Pt doing well. Pt and husband given D/C instructions with Rx, verbal understanding was provided. Pt's IV was removed prior to D/C. Pt's incision is open to air and has no sign of infection. Pt D/C'd home via walking @ 0930 per MD order. Pt is stable @ D/C and has no other needs at this time. Holli Humbles, RN

## 2015-11-09 ENCOUNTER — Other Ambulatory Visit: Payer: Self-pay | Admitting: Registered Nurse

## 2015-11-09 ENCOUNTER — Other Ambulatory Visit (HOSPITAL_COMMUNITY)
Admission: RE | Admit: 2015-11-09 | Discharge: 2015-11-09 | Disposition: A | Discharge status: Home / Self Care | Payer: BLUE CROSS/BLUE SHIELD | Source: Ambulatory Visit | Attending: Internal Medicine | Admitting: Internal Medicine

## 2015-11-09 DIAGNOSIS — Z01419 Encounter for gynecological examination (general) (routine) without abnormal findings: Secondary | ICD-10-CM | POA: Diagnosis not present

## 2015-11-09 DIAGNOSIS — Z780 Asymptomatic menopausal state: Secondary | ICD-10-CM | POA: Diagnosis not present

## 2015-11-09 DIAGNOSIS — N898 Other specified noninflammatory disorders of vagina: Secondary | ICD-10-CM | POA: Diagnosis not present

## 2015-11-09 DIAGNOSIS — Z124 Encounter for screening for malignant neoplasm of cervix: Secondary | ICD-10-CM | POA: Diagnosis not present

## 2015-11-09 DIAGNOSIS — I10 Essential (primary) hypertension: Secondary | ICD-10-CM | POA: Diagnosis not present

## 2015-11-09 DIAGNOSIS — Z0001 Encounter for general adult medical examination with abnormal findings: Secondary | ICD-10-CM | POA: Diagnosis not present

## 2015-11-09 DIAGNOSIS — Z1212 Encounter for screening for malignant neoplasm of rectum: Secondary | ICD-10-CM | POA: Diagnosis not present

## 2015-11-11 LAB — CYTOLOGY - PAP

## 2015-11-24 DIAGNOSIS — I129 Hypertensive chronic kidney disease with stage 1 through stage 4 chronic kidney disease, or unspecified chronic kidney disease: Secondary | ICD-10-CM | POA: Diagnosis not present

## 2015-11-24 DIAGNOSIS — E785 Hyperlipidemia, unspecified: Secondary | ICD-10-CM | POA: Diagnosis not present

## 2015-11-24 DIAGNOSIS — Z0001 Encounter for general adult medical examination with abnormal findings: Secondary | ICD-10-CM | POA: Diagnosis not present

## 2015-11-24 DIAGNOSIS — M8580 Other specified disorders of bone density and structure, unspecified site: Secondary | ICD-10-CM | POA: Diagnosis not present

## 2015-11-24 DIAGNOSIS — N182 Chronic kidney disease, stage 2 (mild): Secondary | ICD-10-CM | POA: Diagnosis not present

## 2015-11-24 DIAGNOSIS — R7309 Other abnormal glucose: Secondary | ICD-10-CM | POA: Diagnosis not present

## 2015-11-29 ENCOUNTER — Other Ambulatory Visit: Payer: Self-pay | Admitting: Internal Medicine

## 2015-11-29 DIAGNOSIS — E041 Nontoxic single thyroid nodule: Secondary | ICD-10-CM

## 2015-11-30 DIAGNOSIS — M1712 Unilateral primary osteoarthritis, left knee: Secondary | ICD-10-CM | POA: Diagnosis not present

## 2015-11-30 DIAGNOSIS — M1812 Unilateral primary osteoarthritis of first carpometacarpal joint, left hand: Secondary | ICD-10-CM | POA: Diagnosis not present

## 2015-12-03 ENCOUNTER — Ambulatory Visit
Admission: RE | Admit: 2015-12-03 | Discharge: 2015-12-03 | Disposition: A | Discharge status: Home / Self Care | Payer: Medicare Other | Source: Ambulatory Visit | Attending: Internal Medicine | Admitting: Internal Medicine

## 2015-12-03 DIAGNOSIS — E041 Nontoxic single thyroid nodule: Secondary | ICD-10-CM

## 2015-12-03 DIAGNOSIS — E042 Nontoxic multinodular goiter: Secondary | ICD-10-CM | POA: Diagnosis not present

## 2015-12-24 DIAGNOSIS — Z9889 Other specified postprocedural states: Secondary | ICD-10-CM | POA: Diagnosis not present

## 2016-01-25 DIAGNOSIS — Z1231 Encounter for screening mammogram for malignant neoplasm of breast: Secondary | ICD-10-CM | POA: Diagnosis not present

## 2016-02-10 DIAGNOSIS — H2513 Age-related nuclear cataract, bilateral: Secondary | ICD-10-CM | POA: Diagnosis not present

## 2016-02-10 DIAGNOSIS — Z0100 Encounter for examination of eyes and vision without abnormal findings: Secondary | ICD-10-CM | POA: Diagnosis not present

## 2016-02-16 DIAGNOSIS — R7309 Other abnormal glucose: Secondary | ICD-10-CM | POA: Diagnosis not present

## 2016-02-16 DIAGNOSIS — M8580 Other specified disorders of bone density and structure, unspecified site: Secondary | ICD-10-CM | POA: Diagnosis not present

## 2016-02-16 DIAGNOSIS — I129 Hypertensive chronic kidney disease with stage 1 through stage 4 chronic kidney disease, or unspecified chronic kidney disease: Secondary | ICD-10-CM | POA: Diagnosis not present

## 2016-02-23 DIAGNOSIS — I129 Hypertensive chronic kidney disease with stage 1 through stage 4 chronic kidney disease, or unspecified chronic kidney disease: Secondary | ICD-10-CM | POA: Diagnosis not present

## 2016-02-23 DIAGNOSIS — E785 Hyperlipidemia, unspecified: Secondary | ICD-10-CM | POA: Diagnosis not present

## 2016-02-23 DIAGNOSIS — R7309 Other abnormal glucose: Secondary | ICD-10-CM | POA: Diagnosis not present

## 2016-02-23 DIAGNOSIS — N182 Chronic kidney disease, stage 2 (mild): Secondary | ICD-10-CM | POA: Diagnosis not present

## 2016-04-06 DIAGNOSIS — M1712 Unilateral primary osteoarthritis, left knee: Secondary | ICD-10-CM | POA: Diagnosis not present

## 2016-05-03 DIAGNOSIS — Z23 Encounter for immunization: Secondary | ICD-10-CM | POA: Diagnosis not present

## 2016-06-09 DIAGNOSIS — M1812 Unilateral primary osteoarthritis of first carpometacarpal joint, left hand: Secondary | ICD-10-CM | POA: Diagnosis not present

## 2016-08-11 DIAGNOSIS — G8929 Other chronic pain: Secondary | ICD-10-CM | POA: Diagnosis not present

## 2016-08-11 DIAGNOSIS — M25562 Pain in left knee: Secondary | ICD-10-CM | POA: Diagnosis not present

## 2016-08-11 DIAGNOSIS — M1712 Unilateral primary osteoarthritis, left knee: Secondary | ICD-10-CM | POA: Diagnosis not present

## 2016-08-18 DIAGNOSIS — M1712 Unilateral primary osteoarthritis, left knee: Secondary | ICD-10-CM | POA: Diagnosis not present

## 2016-08-22 DIAGNOSIS — E785 Hyperlipidemia, unspecified: Secondary | ICD-10-CM | POA: Diagnosis not present

## 2016-08-22 DIAGNOSIS — I129 Hypertensive chronic kidney disease with stage 1 through stage 4 chronic kidney disease, or unspecified chronic kidney disease: Secondary | ICD-10-CM | POA: Diagnosis not present

## 2016-08-22 DIAGNOSIS — R7309 Other abnormal glucose: Secondary | ICD-10-CM | POA: Diagnosis not present

## 2016-08-22 DIAGNOSIS — E559 Vitamin D deficiency, unspecified: Secondary | ICD-10-CM | POA: Diagnosis not present

## 2016-08-24 DIAGNOSIS — E7800 Pure hypercholesterolemia, unspecified: Secondary | ICD-10-CM | POA: Diagnosis not present

## 2016-08-24 DIAGNOSIS — I10 Essential (primary) hypertension: Secondary | ICD-10-CM | POA: Diagnosis not present

## 2016-08-24 DIAGNOSIS — M810 Age-related osteoporosis without current pathological fracture: Secondary | ICD-10-CM | POA: Diagnosis not present

## 2016-08-24 DIAGNOSIS — K219 Gastro-esophageal reflux disease without esophagitis: Secondary | ICD-10-CM | POA: Diagnosis not present

## 2016-08-31 DIAGNOSIS — M23352 Other meniscus derangements, posterior horn of lateral meniscus, left knee: Secondary | ICD-10-CM | POA: Diagnosis not present

## 2016-08-31 DIAGNOSIS — M1712 Unilateral primary osteoarthritis, left knee: Secondary | ICD-10-CM | POA: Diagnosis not present

## 2016-09-06 DIAGNOSIS — Z85828 Personal history of other malignant neoplasm of skin: Secondary | ICD-10-CM | POA: Diagnosis not present

## 2016-09-06 DIAGNOSIS — Z23 Encounter for immunization: Secondary | ICD-10-CM | POA: Diagnosis not present

## 2016-09-06 DIAGNOSIS — L821 Other seborrheic keratosis: Secondary | ICD-10-CM | POA: Diagnosis not present

## 2016-09-06 DIAGNOSIS — D2272 Melanocytic nevi of left lower limb, including hip: Secondary | ICD-10-CM | POA: Diagnosis not present

## 2016-09-06 DIAGNOSIS — L219 Seborrheic dermatitis, unspecified: Secondary | ICD-10-CM | POA: Diagnosis not present

## 2016-09-06 DIAGNOSIS — L814 Other melanin hyperpigmentation: Secondary | ICD-10-CM | POA: Diagnosis not present

## 2016-09-21 IMAGING — US US THYROID
1 series · 14 of 25 positions shown · non-contrast
Comparison: None.

CLINICAL DATA: Thyroid nodule by physical exam

EXAM:
THYROID ULTRASOUND
TECHNIQUE: Ultrasound examination of the thyroid gland and adjacent soft
tissues was performed.

[Series 1: us thyroid · 0.08mm/px · 14 of 62 slices shown]
[im 1/62]
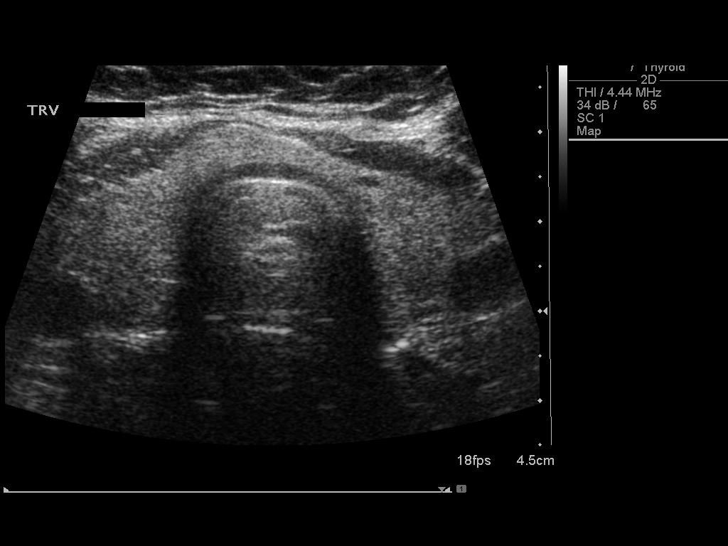
[im 6/62]
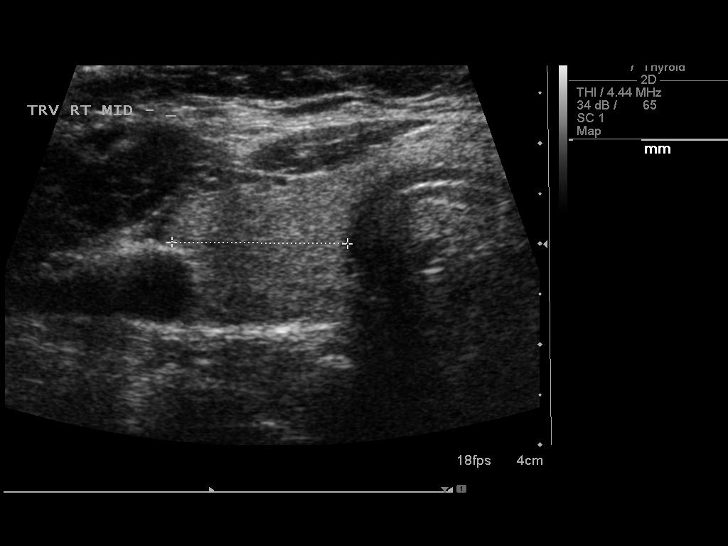
[im 11/62]
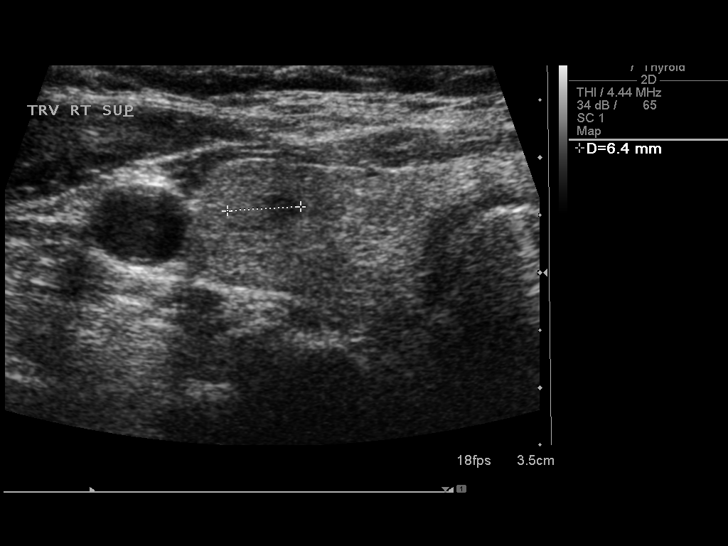
[im 16/62]
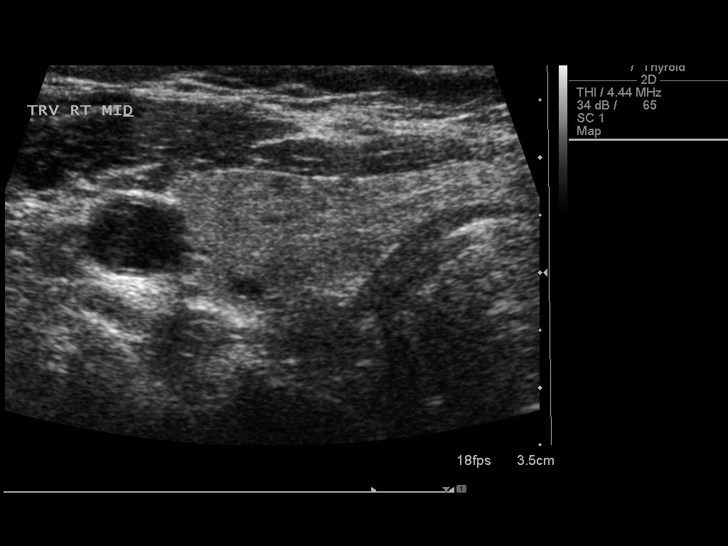
[im 21/62]
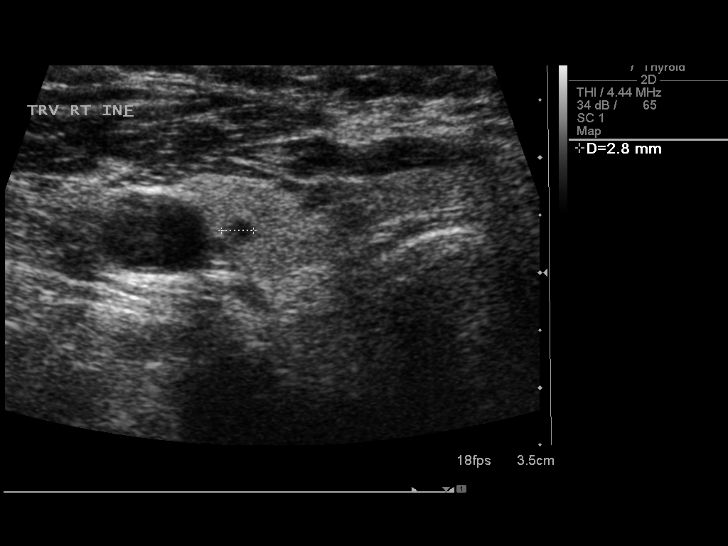
[im 23/62]
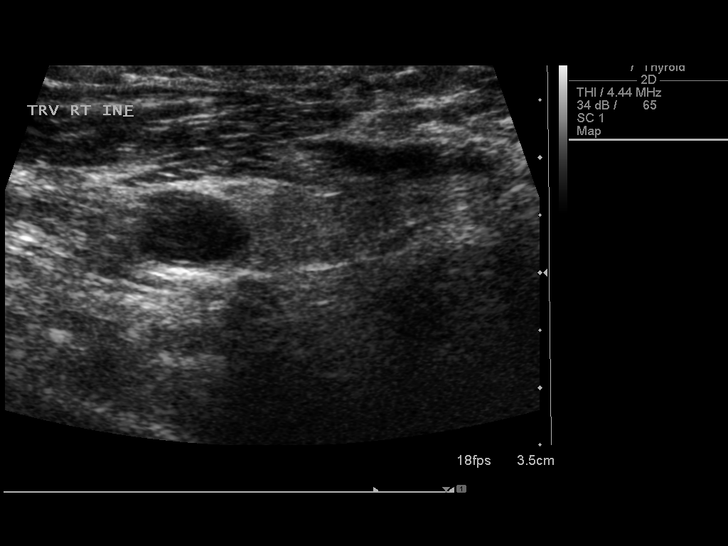
[im 28/62]
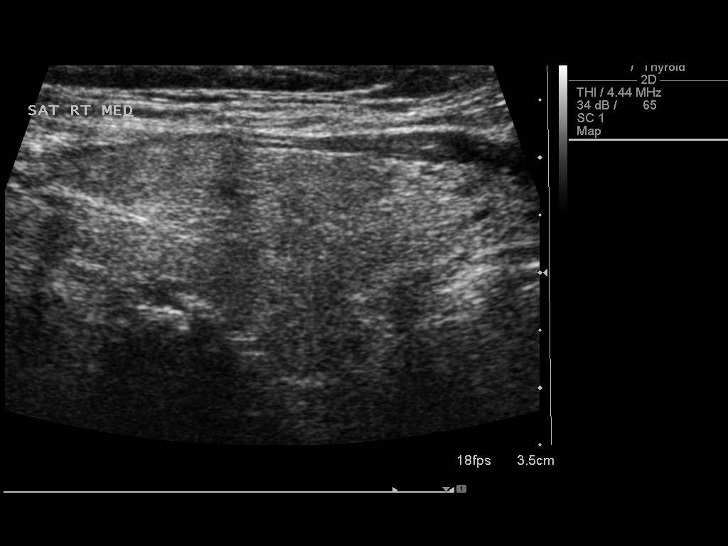
[im 34/62]
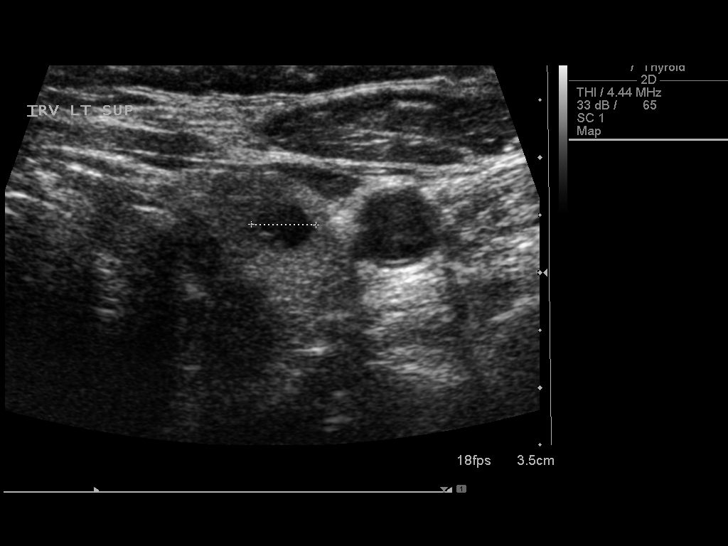
[im 39/62]
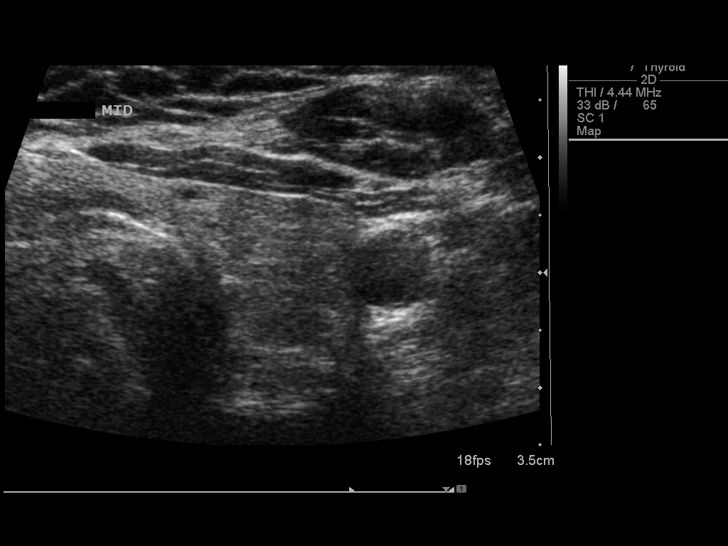
[im 41/62]
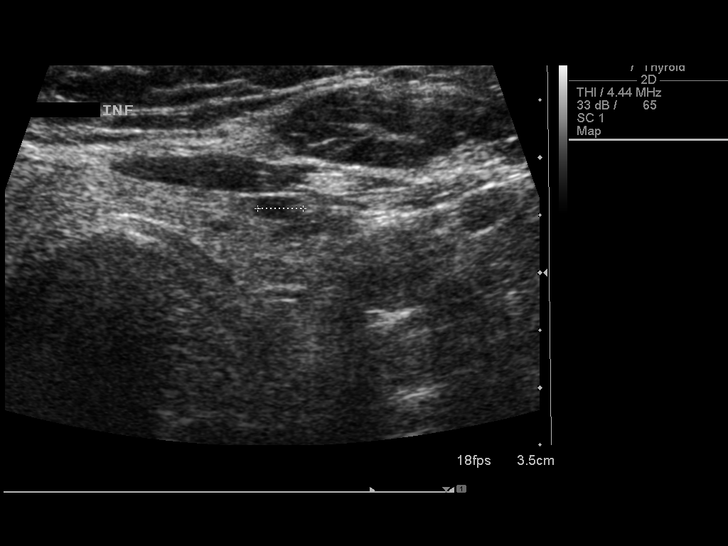
[im 46/62]
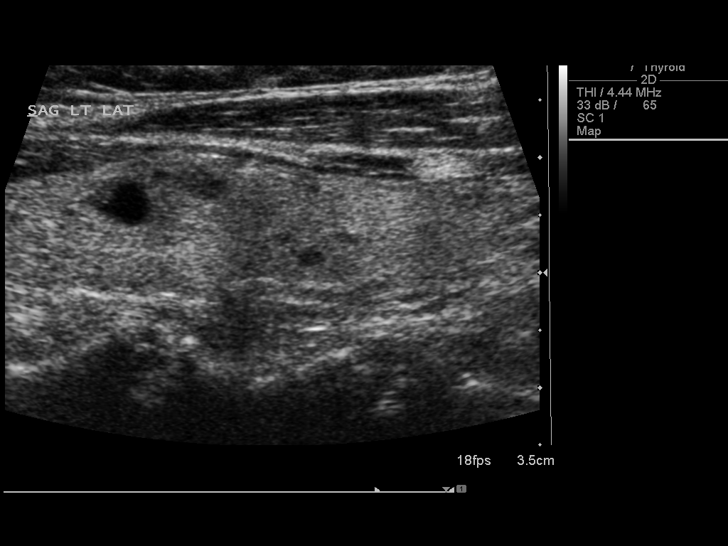
[im 51/62]
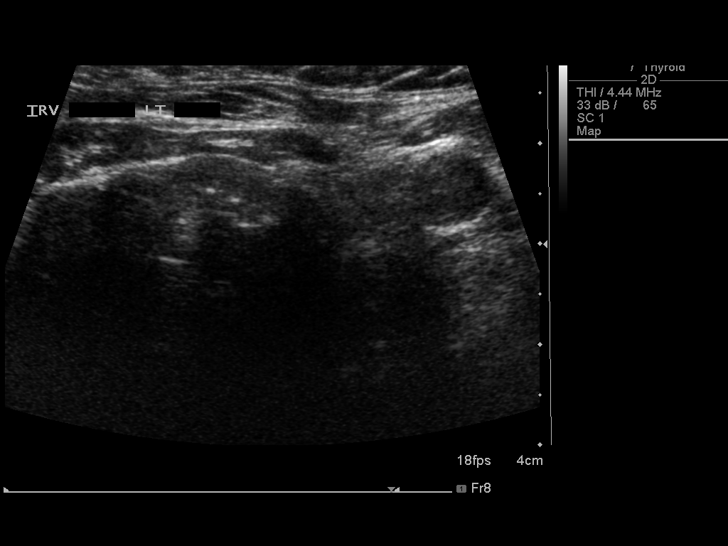
[im 56/62]
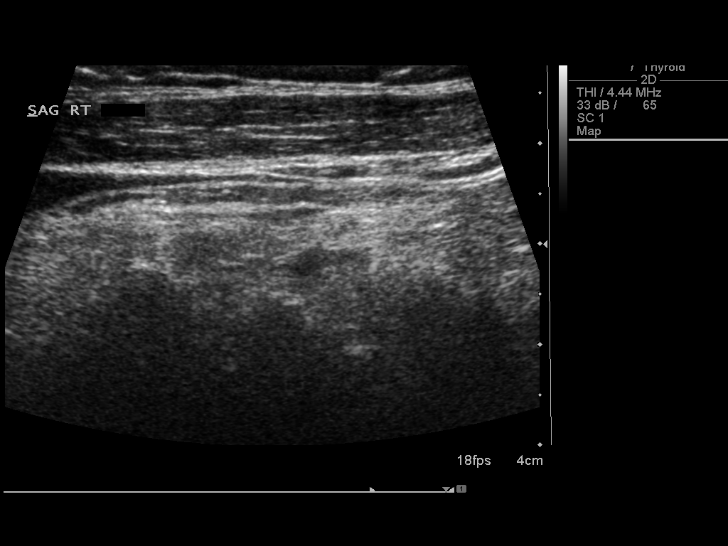
[im 62/62]
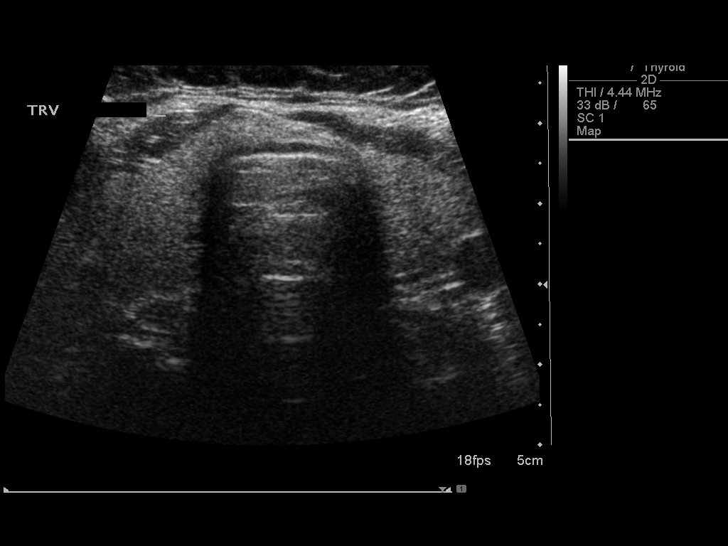

[14 of 25 positions shown; findings below may reference images not displayed]

FINDINGS: Right thyroid lobe

Measurements: 5.0 x 1.3 x 1.8 cm. There are scattered nodules
throughout the right lobe. The largest is solid and in the upper
pole measuring 6 mm.

Left thyroid lobe

Measurements: 5.6 x 1.9 x 1.6 cm. There are several nodules
throughout the left lobe. The largest is complex and in the upper
pole measuring 6 mm.

Isthmus

Thickness: 5 mm. Multiple nodules. The largest is 4 mm on the left
side.

Lymphadenopathy

None visualized.
IMPRESSION: Multiple bilateral and isthmic thyroid solid and complex nodules are
present. The largest is 6 mm. Findings do not meet current SRU
consensus criteria for biopsy. Follow-up by clinical exam is
recommended. If patient has known risk factors for thyroid
carcinoma, consider follow-up ultrasound in 12 months. If patient is
clinically hyperthyroid, consider nuclear medicine thyroid uptake
and scan.Reference: Management of Thyroid Nodules Detected at US:
Society of Radiologists in Ultrasound Consensus Conference

## 2016-11-17 DIAGNOSIS — M1812 Unilateral primary osteoarthritis of first carpometacarpal joint, left hand: Secondary | ICD-10-CM | POA: Diagnosis not present

## 2016-11-28 DIAGNOSIS — Z0000 Encounter for general adult medical examination without abnormal findings: Secondary | ICD-10-CM | POA: Diagnosis not present

## 2016-12-05 DIAGNOSIS — I10 Essential (primary) hypertension: Secondary | ICD-10-CM | POA: Diagnosis not present

## 2016-12-05 DIAGNOSIS — M8580 Other specified disorders of bone density and structure, unspecified site: Secondary | ICD-10-CM | POA: Diagnosis not present

## 2016-12-05 DIAGNOSIS — E7800 Pure hypercholesterolemia, unspecified: Secondary | ICD-10-CM | POA: Diagnosis not present

## 2016-12-05 DIAGNOSIS — Z0000 Encounter for general adult medical examination without abnormal findings: Secondary | ICD-10-CM | POA: Diagnosis not present

## 2017-01-23 DIAGNOSIS — Z1231 Encounter for screening mammogram for malignant neoplasm of breast: Secondary | ICD-10-CM | POA: Diagnosis not present

## 2017-02-12 DIAGNOSIS — H2513 Age-related nuclear cataract, bilateral: Secondary | ICD-10-CM | POA: Diagnosis not present

## 2017-02-12 DIAGNOSIS — H5202 Hypermetropia, left eye: Secondary | ICD-10-CM | POA: Diagnosis not present

## 2017-03-30 DIAGNOSIS — M23352 Other meniscus derangements, posterior horn of lateral meniscus, left knee: Secondary | ICD-10-CM | POA: Diagnosis not present

## 2017-03-30 DIAGNOSIS — M1712 Unilateral primary osteoarthritis, left knee: Secondary | ICD-10-CM | POA: Diagnosis not present

## 2017-06-01 DIAGNOSIS — M7062 Trochanteric bursitis, left hip: Secondary | ICD-10-CM | POA: Diagnosis not present

## 2017-06-01 DIAGNOSIS — M23352 Other meniscus derangements, posterior horn of lateral meniscus, left knee: Secondary | ICD-10-CM | POA: Diagnosis not present

## 2017-06-05 DIAGNOSIS — R7303 Prediabetes: Secondary | ICD-10-CM | POA: Diagnosis not present

## 2017-06-05 DIAGNOSIS — M810 Age-related osteoporosis without current pathological fracture: Secondary | ICD-10-CM | POA: Diagnosis not present

## 2017-06-05 DIAGNOSIS — E7800 Pure hypercholesterolemia, unspecified: Secondary | ICD-10-CM | POA: Diagnosis not present

## 2017-06-06 DIAGNOSIS — M25562 Pain in left knee: Secondary | ICD-10-CM | POA: Diagnosis not present

## 2017-06-06 DIAGNOSIS — M542 Cervicalgia: Secondary | ICD-10-CM | POA: Diagnosis not present

## 2017-06-06 DIAGNOSIS — M9902 Segmental and somatic dysfunction of thoracic region: Secondary | ICD-10-CM | POA: Diagnosis not present

## 2017-06-06 DIAGNOSIS — M9901 Segmental and somatic dysfunction of cervical region: Secondary | ICD-10-CM | POA: Diagnosis not present

## 2017-06-07 DIAGNOSIS — M810 Age-related osteoporosis without current pathological fracture: Secondary | ICD-10-CM | POA: Diagnosis not present

## 2017-06-07 DIAGNOSIS — E7800 Pure hypercholesterolemia, unspecified: Secondary | ICD-10-CM | POA: Diagnosis not present

## 2017-06-07 DIAGNOSIS — I10 Essential (primary) hypertension: Secondary | ICD-10-CM | POA: Diagnosis not present

## 2017-06-07 DIAGNOSIS — Z23 Encounter for immunization: Secondary | ICD-10-CM | POA: Diagnosis not present

## 2017-06-07 DIAGNOSIS — K219 Gastro-esophageal reflux disease without esophagitis: Secondary | ICD-10-CM | POA: Diagnosis not present

## 2017-06-12 DIAGNOSIS — M9901 Segmental and somatic dysfunction of cervical region: Secondary | ICD-10-CM | POA: Diagnosis not present

## 2017-06-12 DIAGNOSIS — M25562 Pain in left knee: Secondary | ICD-10-CM | POA: Diagnosis not present

## 2017-06-12 DIAGNOSIS — M542 Cervicalgia: Secondary | ICD-10-CM | POA: Diagnosis not present

## 2017-06-12 DIAGNOSIS — M9902 Segmental and somatic dysfunction of thoracic region: Secondary | ICD-10-CM | POA: Diagnosis not present

## 2017-06-14 DIAGNOSIS — J45909 Unspecified asthma, uncomplicated: Secondary | ICD-10-CM | POA: Diagnosis not present

## 2017-06-14 DIAGNOSIS — I129 Hypertensive chronic kidney disease with stage 1 through stage 4 chronic kidney disease, or unspecified chronic kidney disease: Secondary | ICD-10-CM | POA: Diagnosis not present

## 2017-06-14 DIAGNOSIS — E785 Hyperlipidemia, unspecified: Secondary | ICD-10-CM | POA: Diagnosis not present

## 2017-06-14 DIAGNOSIS — M810 Age-related osteoporosis without current pathological fracture: Secondary | ICD-10-CM | POA: Diagnosis not present

## 2017-06-18 DIAGNOSIS — M25562 Pain in left knee: Secondary | ICD-10-CM | POA: Diagnosis not present

## 2017-06-26 DIAGNOSIS — M542 Cervicalgia: Secondary | ICD-10-CM | POA: Diagnosis not present

## 2017-06-26 DIAGNOSIS — M9902 Segmental and somatic dysfunction of thoracic region: Secondary | ICD-10-CM | POA: Diagnosis not present

## 2017-06-26 DIAGNOSIS — M25562 Pain in left knee: Secondary | ICD-10-CM | POA: Diagnosis not present

## 2017-06-26 DIAGNOSIS — M9901 Segmental and somatic dysfunction of cervical region: Secondary | ICD-10-CM | POA: Diagnosis not present

## 2017-06-29 DIAGNOSIS — M9901 Segmental and somatic dysfunction of cervical region: Secondary | ICD-10-CM | POA: Diagnosis not present

## 2017-06-29 DIAGNOSIS — M542 Cervicalgia: Secondary | ICD-10-CM | POA: Diagnosis not present

## 2017-06-29 DIAGNOSIS — M9902 Segmental and somatic dysfunction of thoracic region: Secondary | ICD-10-CM | POA: Diagnosis not present

## 2017-06-29 DIAGNOSIS — M25562 Pain in left knee: Secondary | ICD-10-CM | POA: Diagnosis not present

## 2017-07-02 DIAGNOSIS — M9901 Segmental and somatic dysfunction of cervical region: Secondary | ICD-10-CM | POA: Diagnosis not present

## 2017-07-02 DIAGNOSIS — M25562 Pain in left knee: Secondary | ICD-10-CM | POA: Diagnosis not present

## 2017-07-02 DIAGNOSIS — M9902 Segmental and somatic dysfunction of thoracic region: Secondary | ICD-10-CM | POA: Diagnosis not present

## 2017-07-02 DIAGNOSIS — M542 Cervicalgia: Secondary | ICD-10-CM | POA: Diagnosis not present

## 2017-07-04 DIAGNOSIS — M25562 Pain in left knee: Secondary | ICD-10-CM | POA: Diagnosis not present

## 2017-07-04 DIAGNOSIS — M1712 Unilateral primary osteoarthritis, left knee: Secondary | ICD-10-CM | POA: Diagnosis not present

## 2017-07-04 DIAGNOSIS — M542 Cervicalgia: Secondary | ICD-10-CM | POA: Diagnosis not present

## 2017-07-04 DIAGNOSIS — M9901 Segmental and somatic dysfunction of cervical region: Secondary | ICD-10-CM | POA: Diagnosis not present

## 2017-07-04 DIAGNOSIS — M9902 Segmental and somatic dysfunction of thoracic region: Secondary | ICD-10-CM | POA: Diagnosis not present

## 2017-07-06 DIAGNOSIS — M25562 Pain in left knee: Secondary | ICD-10-CM | POA: Diagnosis not present

## 2017-07-06 DIAGNOSIS — M9902 Segmental and somatic dysfunction of thoracic region: Secondary | ICD-10-CM | POA: Diagnosis not present

## 2017-07-06 DIAGNOSIS — M542 Cervicalgia: Secondary | ICD-10-CM | POA: Diagnosis not present

## 2017-07-06 DIAGNOSIS — M9901 Segmental and somatic dysfunction of cervical region: Secondary | ICD-10-CM | POA: Diagnosis not present

## 2017-07-11 DIAGNOSIS — M1712 Unilateral primary osteoarthritis, left knee: Secondary | ICD-10-CM | POA: Diagnosis not present

## 2017-07-18 DIAGNOSIS — M1712 Unilateral primary osteoarthritis, left knee: Secondary | ICD-10-CM | POA: Diagnosis not present

## 2017-08-06 DIAGNOSIS — E7800 Pure hypercholesterolemia, unspecified: Secondary | ICD-10-CM | POA: Diagnosis not present

## 2017-08-06 DIAGNOSIS — I129 Hypertensive chronic kidney disease with stage 1 through stage 4 chronic kidney disease, or unspecified chronic kidney disease: Secondary | ICD-10-CM | POA: Diagnosis not present

## 2017-08-09 DIAGNOSIS — M79642 Pain in left hand: Secondary | ICD-10-CM | POA: Diagnosis not present

## 2017-08-09 DIAGNOSIS — M79643 Pain in unspecified hand: Secondary | ICD-10-CM | POA: Diagnosis not present

## 2017-08-09 DIAGNOSIS — M359 Systemic involvement of connective tissue, unspecified: Secondary | ICD-10-CM | POA: Diagnosis not present

## 2017-08-09 DIAGNOSIS — M180 Bilateral primary osteoarthritis of first carpometacarpal joints: Secondary | ICD-10-CM | POA: Diagnosis not present

## 2017-08-09 DIAGNOSIS — M549 Dorsalgia, unspecified: Secondary | ICD-10-CM | POA: Diagnosis not present

## 2017-08-09 DIAGNOSIS — M79641 Pain in right hand: Secondary | ICD-10-CM | POA: Diagnosis not present

## 2017-08-09 DIAGNOSIS — D8989 Other specified disorders involving the immune mechanism, not elsewhere classified: Secondary | ICD-10-CM | POA: Diagnosis not present

## 2017-08-09 DIAGNOSIS — M1990 Unspecified osteoarthritis, unspecified site: Secondary | ICD-10-CM | POA: Diagnosis not present

## 2017-08-09 DIAGNOSIS — M25569 Pain in unspecified knee: Secondary | ICD-10-CM | POA: Diagnosis not present

## 2017-08-09 DIAGNOSIS — M064 Inflammatory polyarthropathy: Secondary | ICD-10-CM | POA: Diagnosis not present

## 2017-08-15 DIAGNOSIS — M1712 Unilateral primary osteoarthritis, left knee: Secondary | ICD-10-CM | POA: Insufficient documentation

## 2017-09-04 DIAGNOSIS — I10 Essential (primary) hypertension: Secondary | ICD-10-CM | POA: Diagnosis not present

## 2017-09-18 DIAGNOSIS — D2272 Melanocytic nevi of left lower limb, including hip: Secondary | ICD-10-CM | POA: Diagnosis not present

## 2017-09-18 DIAGNOSIS — L723 Sebaceous cyst: Secondary | ICD-10-CM | POA: Diagnosis not present

## 2017-09-18 DIAGNOSIS — L821 Other seborrheic keratosis: Secondary | ICD-10-CM | POA: Diagnosis not present

## 2017-09-18 DIAGNOSIS — L814 Other melanin hyperpigmentation: Secondary | ICD-10-CM | POA: Diagnosis not present

## 2017-09-19 DIAGNOSIS — M25561 Pain in right knee: Secondary | ICD-10-CM | POA: Diagnosis not present

## 2017-09-19 DIAGNOSIS — M1711 Unilateral primary osteoarthritis, right knee: Secondary | ICD-10-CM | POA: Diagnosis not present

## 2017-09-24 DIAGNOSIS — R0989 Other specified symptoms and signs involving the circulatory and respiratory systems: Secondary | ICD-10-CM | POA: Diagnosis not present

## 2017-09-24 DIAGNOSIS — I10 Essential (primary) hypertension: Secondary | ICD-10-CM | POA: Diagnosis not present

## 2017-09-24 DIAGNOSIS — E782 Mixed hyperlipidemia: Secondary | ICD-10-CM | POA: Diagnosis not present

## 2017-09-24 DIAGNOSIS — R002 Palpitations: Secondary | ICD-10-CM | POA: Diagnosis not present

## 2017-09-25 DIAGNOSIS — R002 Palpitations: Secondary | ICD-10-CM | POA: Diagnosis not present

## 2017-10-08 DIAGNOSIS — M1990 Unspecified osteoarthritis, unspecified site: Secondary | ICD-10-CM | POA: Diagnosis not present

## 2017-10-08 DIAGNOSIS — M549 Dorsalgia, unspecified: Secondary | ICD-10-CM | POA: Diagnosis not present

## 2017-10-08 DIAGNOSIS — M359 Systemic involvement of connective tissue, unspecified: Secondary | ICD-10-CM | POA: Diagnosis not present

## 2017-10-08 DIAGNOSIS — M25569 Pain in unspecified knee: Secondary | ICD-10-CM | POA: Diagnosis not present

## 2017-10-08 DIAGNOSIS — R002 Palpitations: Secondary | ICD-10-CM | POA: Diagnosis not present

## 2017-10-08 DIAGNOSIS — M79643 Pain in unspecified hand: Secondary | ICD-10-CM | POA: Diagnosis not present

## 2017-10-08 DIAGNOSIS — Z79899 Other long term (current) drug therapy: Secondary | ICD-10-CM | POA: Diagnosis not present

## 2017-10-08 DIAGNOSIS — M064 Inflammatory polyarthropathy: Secondary | ICD-10-CM | POA: Diagnosis not present

## 2017-10-17 DIAGNOSIS — R002 Palpitations: Secondary | ICD-10-CM | POA: Diagnosis not present

## 2017-10-17 DIAGNOSIS — R0989 Other specified symptoms and signs involving the circulatory and respiratory systems: Secondary | ICD-10-CM | POA: Diagnosis not present

## 2017-10-17 DIAGNOSIS — I10 Essential (primary) hypertension: Secondary | ICD-10-CM | POA: Diagnosis not present

## 2017-10-19 DIAGNOSIS — M25562 Pain in left knee: Secondary | ICD-10-CM | POA: Diagnosis not present

## 2017-10-19 DIAGNOSIS — M542 Cervicalgia: Secondary | ICD-10-CM | POA: Diagnosis not present

## 2017-10-19 DIAGNOSIS — M9902 Segmental and somatic dysfunction of thoracic region: Secondary | ICD-10-CM | POA: Diagnosis not present

## 2017-10-19 DIAGNOSIS — M9903 Segmental and somatic dysfunction of lumbar region: Secondary | ICD-10-CM | POA: Diagnosis not present

## 2017-10-19 DIAGNOSIS — M9904 Segmental and somatic dysfunction of sacral region: Secondary | ICD-10-CM | POA: Diagnosis not present

## 2017-10-19 DIAGNOSIS — M9901 Segmental and somatic dysfunction of cervical region: Secondary | ICD-10-CM | POA: Diagnosis not present

## 2017-10-19 DIAGNOSIS — M545 Low back pain: Secondary | ICD-10-CM | POA: Diagnosis not present

## 2017-10-24 DIAGNOSIS — R002 Palpitations: Secondary | ICD-10-CM | POA: Diagnosis not present

## 2017-10-24 DIAGNOSIS — I10 Essential (primary) hypertension: Secondary | ICD-10-CM | POA: Diagnosis not present

## 2017-10-24 DIAGNOSIS — E782 Mixed hyperlipidemia: Secondary | ICD-10-CM | POA: Diagnosis not present

## 2017-10-24 DIAGNOSIS — R0989 Other specified symptoms and signs involving the circulatory and respiratory systems: Secondary | ICD-10-CM | POA: Diagnosis not present

## 2017-10-25 DIAGNOSIS — L821 Other seborrheic keratosis: Secondary | ICD-10-CM | POA: Diagnosis not present

## 2017-10-25 DIAGNOSIS — L853 Xerosis cutis: Secondary | ICD-10-CM | POA: Diagnosis not present

## 2017-10-31 DIAGNOSIS — M9904 Segmental and somatic dysfunction of sacral region: Secondary | ICD-10-CM | POA: Diagnosis not present

## 2017-10-31 DIAGNOSIS — M9902 Segmental and somatic dysfunction of thoracic region: Secondary | ICD-10-CM | POA: Diagnosis not present

## 2017-10-31 DIAGNOSIS — M545 Low back pain: Secondary | ICD-10-CM | POA: Diagnosis not present

## 2017-10-31 DIAGNOSIS — M9903 Segmental and somatic dysfunction of lumbar region: Secondary | ICD-10-CM | POA: Diagnosis not present

## 2017-10-31 DIAGNOSIS — M25562 Pain in left knee: Secondary | ICD-10-CM | POA: Diagnosis not present

## 2017-10-31 DIAGNOSIS — M9901 Segmental and somatic dysfunction of cervical region: Secondary | ICD-10-CM | POA: Diagnosis not present

## 2017-10-31 DIAGNOSIS — M542 Cervicalgia: Secondary | ICD-10-CM | POA: Diagnosis not present

## 2017-12-06 DIAGNOSIS — R7309 Other abnormal glucose: Secondary | ICD-10-CM | POA: Diagnosis not present

## 2017-12-06 DIAGNOSIS — E559 Vitamin D deficiency, unspecified: Secondary | ICD-10-CM | POA: Diagnosis not present

## 2017-12-06 DIAGNOSIS — I129 Hypertensive chronic kidney disease with stage 1 through stage 4 chronic kidney disease, or unspecified chronic kidney disease: Secondary | ICD-10-CM | POA: Diagnosis not present

## 2017-12-06 DIAGNOSIS — E7800 Pure hypercholesterolemia, unspecified: Secondary | ICD-10-CM | POA: Diagnosis not present

## 2017-12-06 DIAGNOSIS — N182 Chronic kidney disease, stage 2 (mild): Secondary | ICD-10-CM | POA: Diagnosis not present

## 2017-12-11 DIAGNOSIS — M9905 Segmental and somatic dysfunction of pelvic region: Secondary | ICD-10-CM | POA: Diagnosis not present

## 2017-12-11 DIAGNOSIS — M9902 Segmental and somatic dysfunction of thoracic region: Secondary | ICD-10-CM | POA: Diagnosis not present

## 2017-12-11 DIAGNOSIS — M9903 Segmental and somatic dysfunction of lumbar region: Secondary | ICD-10-CM | POA: Diagnosis not present

## 2017-12-11 DIAGNOSIS — M9904 Segmental and somatic dysfunction of sacral region: Secondary | ICD-10-CM | POA: Diagnosis not present

## 2017-12-11 DIAGNOSIS — M542 Cervicalgia: Secondary | ICD-10-CM | POA: Diagnosis not present

## 2017-12-11 DIAGNOSIS — M9901 Segmental and somatic dysfunction of cervical region: Secondary | ICD-10-CM | POA: Diagnosis not present

## 2017-12-11 DIAGNOSIS — M545 Low back pain: Secondary | ICD-10-CM | POA: Diagnosis not present

## 2017-12-12 DIAGNOSIS — M545 Low back pain: Secondary | ICD-10-CM | POA: Diagnosis not present

## 2017-12-12 DIAGNOSIS — M9904 Segmental and somatic dysfunction of sacral region: Secondary | ICD-10-CM | POA: Diagnosis not present

## 2017-12-12 DIAGNOSIS — M9901 Segmental and somatic dysfunction of cervical region: Secondary | ICD-10-CM | POA: Diagnosis not present

## 2017-12-12 DIAGNOSIS — M542 Cervicalgia: Secondary | ICD-10-CM | POA: Diagnosis not present

## 2017-12-12 DIAGNOSIS — M9902 Segmental and somatic dysfunction of thoracic region: Secondary | ICD-10-CM | POA: Diagnosis not present

## 2017-12-12 DIAGNOSIS — M9905 Segmental and somatic dysfunction of pelvic region: Secondary | ICD-10-CM | POA: Diagnosis not present

## 2017-12-12 DIAGNOSIS — M9903 Segmental and somatic dysfunction of lumbar region: Secondary | ICD-10-CM | POA: Diagnosis not present

## 2017-12-13 ENCOUNTER — Other Ambulatory Visit: Payer: Self-pay | Admitting: Internal Medicine

## 2017-12-13 DIAGNOSIS — M810 Age-related osteoporosis without current pathological fracture: Secondary | ICD-10-CM | POA: Diagnosis not present

## 2017-12-13 DIAGNOSIS — M545 Low back pain: Secondary | ICD-10-CM | POA: Diagnosis not present

## 2017-12-13 DIAGNOSIS — E041 Nontoxic single thyroid nodule: Secondary | ICD-10-CM

## 2017-12-13 DIAGNOSIS — K219 Gastro-esophageal reflux disease without esophagitis: Secondary | ICD-10-CM | POA: Diagnosis not present

## 2017-12-13 DIAGNOSIS — I10 Essential (primary) hypertension: Secondary | ICD-10-CM | POA: Diagnosis not present

## 2017-12-13 DIAGNOSIS — E785 Hyperlipidemia, unspecified: Secondary | ICD-10-CM | POA: Diagnosis not present

## 2017-12-13 DIAGNOSIS — M069 Rheumatoid arthritis, unspecified: Secondary | ICD-10-CM | POA: Diagnosis not present

## 2017-12-13 DIAGNOSIS — Z0000 Encounter for general adult medical examination without abnormal findings: Secondary | ICD-10-CM | POA: Diagnosis not present

## 2017-12-17 DIAGNOSIS — M545 Low back pain: Secondary | ICD-10-CM | POA: Diagnosis not present

## 2017-12-17 DIAGNOSIS — M9904 Segmental and somatic dysfunction of sacral region: Secondary | ICD-10-CM | POA: Diagnosis not present

## 2017-12-17 DIAGNOSIS — M9905 Segmental and somatic dysfunction of pelvic region: Secondary | ICD-10-CM | POA: Diagnosis not present

## 2017-12-17 DIAGNOSIS — M9903 Segmental and somatic dysfunction of lumbar region: Secondary | ICD-10-CM | POA: Diagnosis not present

## 2017-12-20 ENCOUNTER — Ambulatory Visit
Admission: RE | Admit: 2017-12-20 | Discharge: 2017-12-20 | Disposition: A | Discharge status: Home / Self Care | Payer: BLUE CROSS/BLUE SHIELD | Source: Ambulatory Visit | Attending: Internal Medicine | Admitting: Internal Medicine

## 2017-12-20 DIAGNOSIS — E041 Nontoxic single thyroid nodule: Secondary | ICD-10-CM

## 2017-12-20 DIAGNOSIS — E010 Iodine-deficiency related diffuse (endemic) goiter: Secondary | ICD-10-CM | POA: Diagnosis not present

## 2017-12-27 DIAGNOSIS — M549 Dorsalgia, unspecified: Secondary | ICD-10-CM | POA: Diagnosis not present

## 2017-12-27 DIAGNOSIS — M064 Inflammatory polyarthropathy: Secondary | ICD-10-CM | POA: Diagnosis not present

## 2017-12-27 DIAGNOSIS — M359 Systemic involvement of connective tissue, unspecified: Secondary | ICD-10-CM | POA: Diagnosis not present

## 2017-12-27 DIAGNOSIS — M79643 Pain in unspecified hand: Secondary | ICD-10-CM | POA: Diagnosis not present

## 2017-12-27 DIAGNOSIS — M1990 Unspecified osteoarthritis, unspecified site: Secondary | ICD-10-CM | POA: Diagnosis not present

## 2017-12-27 DIAGNOSIS — Z79899 Other long term (current) drug therapy: Secondary | ICD-10-CM | POA: Diagnosis not present

## 2017-12-27 DIAGNOSIS — M797 Fibromyalgia: Secondary | ICD-10-CM | POA: Diagnosis not present

## 2017-12-27 DIAGNOSIS — M25569 Pain in unspecified knee: Secondary | ICD-10-CM | POA: Diagnosis not present

## 2018-01-24 DIAGNOSIS — Z1231 Encounter for screening mammogram for malignant neoplasm of breast: Secondary | ICD-10-CM | POA: Diagnosis not present

## 2018-02-11 DIAGNOSIS — J45909 Unspecified asthma, uncomplicated: Secondary | ICD-10-CM | POA: Diagnosis not present

## 2018-02-11 DIAGNOSIS — J0100 Acute maxillary sinusitis, unspecified: Secondary | ICD-10-CM | POA: Diagnosis not present

## 2018-02-11 DIAGNOSIS — J069 Acute upper respiratory infection, unspecified: Secondary | ICD-10-CM | POA: Diagnosis not present

## 2018-02-11 DIAGNOSIS — I10 Essential (primary) hypertension: Secondary | ICD-10-CM | POA: Diagnosis not present

## 2018-03-26 DIAGNOSIS — M25569 Pain in unspecified knee: Secondary | ICD-10-CM | POA: Diagnosis not present

## 2018-03-26 DIAGNOSIS — Z79899 Other long term (current) drug therapy: Secondary | ICD-10-CM | POA: Diagnosis not present

## 2018-03-26 DIAGNOSIS — M797 Fibromyalgia: Secondary | ICD-10-CM | POA: Diagnosis not present

## 2018-03-26 DIAGNOSIS — M79643 Pain in unspecified hand: Secondary | ICD-10-CM | POA: Diagnosis not present

## 2018-03-26 DIAGNOSIS — M064 Inflammatory polyarthropathy: Secondary | ICD-10-CM | POA: Diagnosis not present

## 2018-03-26 DIAGNOSIS — M1990 Unspecified osteoarthritis, unspecified site: Secondary | ICD-10-CM | POA: Diagnosis not present

## 2018-03-26 DIAGNOSIS — M549 Dorsalgia, unspecified: Secondary | ICD-10-CM | POA: Diagnosis not present

## 2018-03-26 DIAGNOSIS — M359 Systemic involvement of connective tissue, unspecified: Secondary | ICD-10-CM | POA: Diagnosis not present

## 2018-04-10 DIAGNOSIS — M1990 Unspecified osteoarthritis, unspecified site: Secondary | ICD-10-CM | POA: Diagnosis not present

## 2018-04-10 DIAGNOSIS — M797 Fibromyalgia: Secondary | ICD-10-CM | POA: Diagnosis not present

## 2018-04-10 DIAGNOSIS — Z79899 Other long term (current) drug therapy: Secondary | ICD-10-CM | POA: Diagnosis not present

## 2018-04-10 DIAGNOSIS — M25569 Pain in unspecified knee: Secondary | ICD-10-CM | POA: Diagnosis not present

## 2018-04-10 DIAGNOSIS — M549 Dorsalgia, unspecified: Secondary | ICD-10-CM | POA: Diagnosis not present

## 2018-04-10 DIAGNOSIS — M79643 Pain in unspecified hand: Secondary | ICD-10-CM | POA: Diagnosis not present

## 2018-04-10 DIAGNOSIS — M064 Inflammatory polyarthropathy: Secondary | ICD-10-CM | POA: Diagnosis not present

## 2018-04-10 DIAGNOSIS — M359 Systemic involvement of connective tissue, unspecified: Secondary | ICD-10-CM | POA: Diagnosis not present

## 2018-04-15 DIAGNOSIS — D692 Other nonthrombocytopenic purpura: Secondary | ICD-10-CM | POA: Diagnosis not present

## 2018-04-29 DIAGNOSIS — Z79899 Other long term (current) drug therapy: Secondary | ICD-10-CM | POA: Diagnosis not present

## 2018-04-29 DIAGNOSIS — H04123 Dry eye syndrome of bilateral lacrimal glands: Secondary | ICD-10-CM | POA: Diagnosis not present

## 2018-04-29 DIAGNOSIS — H5202 Hypermetropia, left eye: Secondary | ICD-10-CM | POA: Diagnosis not present

## 2018-05-21 DIAGNOSIS — S0502XA Injury of conjunctiva and corneal abrasion without foreign body, left eye, initial encounter: Secondary | ICD-10-CM | POA: Diagnosis not present

## 2018-05-27 DIAGNOSIS — M79643 Pain in unspecified hand: Secondary | ICD-10-CM | POA: Diagnosis not present

## 2018-05-27 DIAGNOSIS — M25569 Pain in unspecified knee: Secondary | ICD-10-CM | POA: Diagnosis not present

## 2018-05-27 DIAGNOSIS — M11271 Other chondrocalcinosis, right ankle and foot: Secondary | ICD-10-CM | POA: Diagnosis not present

## 2018-05-27 DIAGNOSIS — M797 Fibromyalgia: Secondary | ICD-10-CM | POA: Diagnosis not present

## 2018-05-27 DIAGNOSIS — M11272 Other chondrocalcinosis, left ankle and foot: Secondary | ICD-10-CM | POA: Diagnosis not present

## 2018-05-27 DIAGNOSIS — M1990 Unspecified osteoarthritis, unspecified site: Secondary | ICD-10-CM | POA: Diagnosis not present

## 2018-05-27 DIAGNOSIS — M25561 Pain in right knee: Secondary | ICD-10-CM | POA: Diagnosis not present

## 2018-05-27 DIAGNOSIS — M359 Systemic involvement of connective tissue, unspecified: Secondary | ICD-10-CM | POA: Diagnosis not present

## 2018-05-27 DIAGNOSIS — M064 Inflammatory polyarthropathy: Secondary | ICD-10-CM | POA: Diagnosis not present

## 2018-05-27 DIAGNOSIS — Z79899 Other long term (current) drug therapy: Secondary | ICD-10-CM | POA: Diagnosis not present

## 2018-05-27 DIAGNOSIS — M549 Dorsalgia, unspecified: Secondary | ICD-10-CM | POA: Diagnosis not present

## 2018-06-17 DIAGNOSIS — M069 Rheumatoid arthritis, unspecified: Secondary | ICD-10-CM | POA: Diagnosis not present

## 2018-06-17 DIAGNOSIS — I129 Hypertensive chronic kidney disease with stage 1 through stage 4 chronic kidney disease, or unspecified chronic kidney disease: Secondary | ICD-10-CM | POA: Diagnosis not present

## 2018-06-17 DIAGNOSIS — I10 Essential (primary) hypertension: Secondary | ICD-10-CM | POA: Diagnosis not present

## 2018-07-04 ENCOUNTER — Other Ambulatory Visit: Payer: Self-pay

## 2018-07-04 ENCOUNTER — Ambulatory Visit (HOSPITAL_COMMUNITY)
Admission: EM | Admit: 2018-07-04 | Discharge: 2018-07-04 | Disposition: A | Discharge status: Home / Self Care | Payer: BLUE CROSS/BLUE SHIELD | Attending: Family Medicine | Admitting: Family Medicine

## 2018-07-04 ENCOUNTER — Encounter (HOSPITAL_COMMUNITY): Payer: Self-pay | Admitting: Emergency Medicine

## 2018-07-04 DIAGNOSIS — W01198A Fall on same level from slipping, tripping and stumbling with subsequent striking against other object, initial encounter: Secondary | ICD-10-CM | POA: Diagnosis not present

## 2018-07-04 DIAGNOSIS — Z5189 Encounter for other specified aftercare: Secondary | ICD-10-CM | POA: Diagnosis not present

## 2018-07-04 DIAGNOSIS — L03115 Cellulitis of right lower limb: Secondary | ICD-10-CM | POA: Diagnosis not present

## 2018-07-04 MED ORDER — CEPHALEXIN 500 MG PO CAPS: 500.0000 mg | ORAL_CAPSULE | Freq: Four times a day (QID) | ORAL | 0 refills | Status: AC

## 2018-07-04 NOTE — ED Triage Notes (Signed)
PT has a 10 day old skin tear to right shin that she would like to get checked.

## 2018-07-04 NOTE — Discharge Instructions (Signed)
Please begin keflex 4 times a day for the next week Continue to monitor pain, redness, swelling and follow up if continuing to worsen despite starting antibioitic  Ice area to help with swelling Keep clean and dry

## 2018-07-05 NOTE — ED Provider Notes (Signed)
[place]    CSN: [id number] Arrival date & time: 07/04/18  1216     History   Chief Complaint Chief Complaint  Patient presents with  . Wound Check    HPI Donna [name] is a 72 y.o. female history of hypertension, COPD, GERD presenting today for evaluation of a wound.  Patient states that approximately 10 days ago she was attempting to get into her son's large truck while it was raining.  She stepped on the running board her foot slipped in her shin scraped across the car/edge of the running board.  She is presenting today as she is concerned that healing has not been progressing and has had increased pain, tenderness and redness to this area.  She does report some drainage.  Denies fevers.  Denies difficulty moving ankle or knee.  HPI  Past Medical History:  Diagnosis Date  . Arthritis    lumbar, toes, L knee, cerv. spine & hands   . Cancer ([place])    basal cell removed fr. face   . COPD (chronic obstructive pulmonary disease) ([place])   . GERD (gastroesophageal reflux disease)   . H/O echocardiogram   . Hiatal hernia   . History of hiatal hernia   . Hypertension   . Hypertension    currently followed for BP / heart management with Dr. Leanora Ivanoff, but prev. saw Dr. Einar Gip    Patient Active Problem List   Diagnosis Date Noted  . 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 COPD (chronic obstructive pulmonary disease) ([place]) 01/20/2011    Past Surgical History:  Procedure Laterality Date  . BACK SURGERY  1994   cerv. spine fusion   . BREAST ENHANCEMENT SURGERY    . BREAST SURGERY  1980's   implants- bilateral   . CERVICAL SPINE SURGERY  1992   fusion  . JOINT REPLACEMENT Right 2007  . LUMBAR LAMINECTOMY/DECOMPRESSION MICRODISCECTOMY Left 10/07/2015   Procedure: Lumbar three-five Decompressive lumbar Laminectomy & Left Lumbar four-five Microdiscectomy;  Surgeon: Jovita Gamma, MD;  Location: [facility];  Service: Neurosurgery;  Laterality:  Left;  . TONSILLECTOMY    . TOTAL HIP ARTHROPLASTY  2007   Right  . TUBAL LIGATION      OB History    Gravida  0   Para  0   Term  0   Preterm  0   AB  0   Living        SAB  0   TAB  0   Ectopic  0   Multiple      Live Births               Home Medications    Prior to Admission medications   Medication Sig Start Date End Date Taking? Authorizing Provider  aspirin 81 MG tablet Take 81 mg by mouth daily.   Yes [provider]  Cholecalciferol (VITAMIN D3 PO) Take 2,400 Units by mouth daily.   Yes [provider]  Coenzyme Q10 (COQ-10) 100 MG CAPS Take 1 capsule by mouth daily.   Yes [provider]  DULoxetine (CYMBALTA) 20 MG capsule Take 20 mg by mouth daily.   Yes [provider]  hydroxychloroquine (PLAQUENIL) 200 MG tablet Take by mouth daily.   Yes [provider]  losartan (COZAAR) 50 MG tablet Take 50 mg by mouth daily before breakfast.    Yes [provider]  metoprolol succinate (TOPROL-XL) 100 MG 24 hr tablet Take 100 mg  by mouth daily after breakfast. Take with or immediately following a meal.   Yes [provider]  Multiple Vitamins-Minerals (MULTIVITAMIN WOMEN) TABS Take 1 tablet by mouth daily.   Yes [provider]  omeprazole (PRILOSEC) 20 MG capsule Take 20 mg by mouth daily before breakfast.    Yes [provider]  albuterol (PROVENTIL HFA;VENTOLIN HFA) 108 (90 Base) MCG/ACT inhaler Inhale 2 puffs into the lungs every 4 (four) hours as needed for wheezing or shortness of breath.    [provider]  cephALEXin (KEFLEX) 500 MG capsule Take 1 capsule (500 mg total) by mouth 4 (four) times daily for 7 days. 07/04/18 07/11/18  Raynaldo Falco C, PA-C  Ginger, Zingiber officinalis, (GINGER ROOT PO) Take 1 capsule by mouth daily.    [provider]  HYDROcodone-acetaminophen (NORCO/VICODIN) 5-325 MG tablet Take 1 tablet by mouth every 8 (eight) hours as  needed. 10/04/15   [provider]  HYDROcodone-acetaminophen (NORCO/VICODIN) 5-325 MG tablet Take 1-2 tablets by mouth every 4 (four) hours as needed (pain). 10/08/15   Jovita Gamma, MD  magnesium gluconate (MAGONATE) 500 MG tablet Take 500 mg by mouth daily.    [provider]  Turmeric Curcumin 500 MG CAPS Take 1,000 mg by mouth daily.    [provider]  vitamin B-12 (CYANOCOBALAMIN) 1000 MCG tablet Take 1,000 mcg by mouth daily.    [provider]    Family History Family History  Problem Relation Age of Onset  . Heart disease Maternal Aunt   . Heart disease Maternal Aunt   . Lung cancer Sister   . Kidney cancer Mother   . Kidney cancer Maternal Uncle   . Kidney cancer Maternal Aunt     Social History Social History   Tobacco Use  . Smoking status: Former Smoker    Packs/day: 0.70    Years: 24.00    Pack years: 16.80    Types: Cigarettes    Last attempt to quit: 07/31/2009    Years since quitting: 8.9  . Smokeless tobacco: Former Systems developer    Quit date: 10/01/1998  Substance Use Topics  . Alcohol use: Yes    Comment: wine- almost daily  . Drug use: No     Allergies   Thorazine [chlorpromazine]   Review of Systems Review of Systems  Constitutional: Negative for fatigue and fever.  HENT: Negative for mouth sores.   Eyes: Negative for visual disturbance.  Respiratory: Negative for shortness of breath.   Cardiovascular: Negative for chest pain.  Gastrointestinal: Negative for abdominal pain, nausea and vomiting.  Genitourinary: Negative for genital sores.  Musculoskeletal: Negative for arthralgias and joint swelling.  Skin: Positive for color change and wound. Negative for rash.  Neurological: Negative for dizziness, weakness, light-headedness and headaches.     Physical Exam Triage Vital Signs ED Triage Vitals  Enc Vitals Group     BP 07/04/18 1354 (!) 157/67     Pulse Rate 07/04/18 1354 91     Resp 07/04/18 1354 16      Temp 07/04/18 1354 98.6 F (37 C)     Temp Source 07/04/18 1354 Oral     SpO2 07/04/18 1354 96 %     Weight --      Height --      Head Circumference --      Peak Flow --      Pain Score 07/04/18 1352 9     Pain Loc --      Pain Edu? --  Excl. in GC? --    No data found.  Updated Vital Signs BP (!) 157/67   Pulse 91   Temp 98.6 F (37 C) (Oral)   Resp 16   SpO2 96%   Visual Acuity Right Eye Distance:   Left Eye Distance:   Bilateral Distance:    Right Eye Near:   Left Eye Near:    Bilateral Near:     Physical Exam  Constitutional: She is oriented to person, place, and time. She appears well-developed and well-nourished.  No acute distress  HENT:  Head: Normocephalic and atraumatic.  Nose: Nose normal.  Eyes: Conjunctivae are normal.  Neck: Neck supple.  Cardiovascular: Normal rate.  Pulmonary/Chest: Effort normal. No respiratory distress.  Abdominal: She exhibits no distension.  Musculoskeletal: Normal range of motion.  Full active range of motion of knee and ankle on right lower extremity  Neurological: She is alert and oriented to person, place, and time.  Skin: Skin is warm and dry.  Right shin:, 2 cm wide by 3 cm tall scabbing wound to anterior aspect of shin, mild surrounding erythema, very sensitive to touch with slight increased warmth, no obvious drainage noted.  Psychiatric: She has a normal mood and affect.  Nursing note and vitals reviewed.      UC Treatments / Results  Labs (all labs ordered are listed, but only abnormal results are displayed) Labs Reviewed - No data to display  EKG None  Radiology No results found.  Procedures Procedures (including critical care time)  Medications Ordered in UC Medications - No data to display  Initial Impression / Assessment and Plan / UC Course  I have reviewed the triage vital signs and the nursing notes.  Pertinent labs & imaging results that were available during my care of the patient  were reviewed by me and considered in my medical decision making (see chart for details).     Patient with slow healing wound possibly related to severity of wound versus infection.  Given increasing redness and pain of recently will initiate antibiotic therapy with Keflex.  Will have patient continue to monitor symptoms, follow-up if redness, swelling and pain continuing to spread farther from wound.Discussed strict return precautions. Patient verbalized understanding and is agreeable with plan.  Final Clinical Impressions(s) / UC Diagnoses   Final diagnoses:  Visit for wound check  Cellulitis of right lower extremity     Discharge Instructions     Please begin keflex 4 times a day for the next week Continue to monitor pain, redness, swelling and follow up if continuing to worsen despite starting antibioitic  Ice area to help with swelling Keep clean and dry   ED Prescriptions    Medication Sig Dispense Auth. Provider   cephALEXin (KEFLEX) 500 MG capsule Take 1 capsule (500 mg total) by mouth 4 (four) times daily for 7 days. 28 capsule Jillana Selph C, PA-C     Controlled Substance Prescriptions [place] Controlled Substance Registry consulted? Not Applicable   Janith Lima, [place] 07/05/18 [id number]

## 2018-08-12 DIAGNOSIS — M79643 Pain in unspecified hand: Secondary | ICD-10-CM | POA: Diagnosis not present

## 2018-08-12 DIAGNOSIS — Z79899 Other long term (current) drug therapy: Secondary | ICD-10-CM | POA: Diagnosis not present

## 2018-08-12 DIAGNOSIS — M549 Dorsalgia, unspecified: Secondary | ICD-10-CM | POA: Diagnosis not present

## 2018-08-12 DIAGNOSIS — M1990 Unspecified osteoarthritis, unspecified site: Secondary | ICD-10-CM | POA: Diagnosis not present

## 2018-08-12 DIAGNOSIS — M797 Fibromyalgia: Secondary | ICD-10-CM | POA: Diagnosis not present

## 2018-08-12 DIAGNOSIS — M359 Systemic involvement of connective tissue, unspecified: Secondary | ICD-10-CM | POA: Diagnosis not present

## 2018-08-12 DIAGNOSIS — M25569 Pain in unspecified knee: Secondary | ICD-10-CM | POA: Diagnosis not present

## 2018-08-12 DIAGNOSIS — M064 Inflammatory polyarthropathy: Secondary | ICD-10-CM | POA: Diagnosis not present

## 2018-09-11 ENCOUNTER — Other Ambulatory Visit: Payer: Self-pay | Admitting: Cardiology

## 2018-09-11 DIAGNOSIS — R0989 Other specified symptoms and signs involving the circulatory and respiratory systems: Secondary | ICD-10-CM

## 2018-10-10 ENCOUNTER — Other Ambulatory Visit: Payer: Self-pay

## 2018-10-10 ENCOUNTER — Ambulatory Visit: Payer: BLUE CROSS/BLUE SHIELD

## 2018-10-10 DIAGNOSIS — R0989 Other specified symptoms and signs involving the circulatory and respiratory systems: Secondary | ICD-10-CM | POA: Diagnosis not present

## 2018-10-13 ENCOUNTER — Other Ambulatory Visit: Payer: Self-pay | Admitting: Cardiology

## 2018-10-13 DIAGNOSIS — R0989 Other specified symptoms and signs involving the circulatory and respiratory systems: Secondary | ICD-10-CM

## 2018-10-13 DIAGNOSIS — I6521 Occlusion and stenosis of right carotid artery: Secondary | ICD-10-CM

## 2018-10-14 NOTE — Progress Notes (Signed)
Duplicate note.Marland KitchenMarland KitchenMarland KitchenMarland Kitchenpatient aware

## 2018-10-14 NOTE — Progress Notes (Signed)
Pt aware of results and pending appt.//ah

## 2018-10-23 ENCOUNTER — Ambulatory Visit: Payer: Self-pay | Admitting: Cardiology

## 2018-12-10 DIAGNOSIS — M359 Systemic involvement of connective tissue, unspecified: Secondary | ICD-10-CM | POA: Diagnosis not present

## 2018-12-10 DIAGNOSIS — M79643 Pain in unspecified hand: Secondary | ICD-10-CM | POA: Diagnosis not present

## 2018-12-10 DIAGNOSIS — M064 Inflammatory polyarthropathy: Secondary | ICD-10-CM | POA: Diagnosis not present

## 2018-12-10 DIAGNOSIS — M1990 Unspecified osteoarthritis, unspecified site: Secondary | ICD-10-CM | POA: Diagnosis not present

## 2018-12-18 DIAGNOSIS — R600 Localized edema: Secondary | ICD-10-CM | POA: Diagnosis not present

## 2018-12-18 DIAGNOSIS — R51 Headache: Secondary | ICD-10-CM | POA: Diagnosis not present

## 2018-12-25 DIAGNOSIS — R600 Localized edema: Secondary | ICD-10-CM | POA: Diagnosis not present

## 2018-12-25 DIAGNOSIS — R238 Other skin changes: Secondary | ICD-10-CM | POA: Diagnosis not present

## 2019-01-07 ENCOUNTER — Other Ambulatory Visit: Payer: Self-pay | Admitting: Internal Medicine

## 2019-01-07 DIAGNOSIS — I872 Venous insufficiency (chronic) (peripheral): Secondary | ICD-10-CM

## 2019-02-04 DIAGNOSIS — R1312 Dysphagia, oropharyngeal phase: Secondary | ICD-10-CM | POA: Diagnosis not present

## 2019-02-04 DIAGNOSIS — M25472 Effusion, left ankle: Secondary | ICD-10-CM | POA: Diagnosis not present

## 2019-02-10 DIAGNOSIS — M79643 Pain in unspecified hand: Secondary | ICD-10-CM | POA: Diagnosis not present

## 2019-02-10 DIAGNOSIS — M359 Systemic involvement of connective tissue, unspecified: Secondary | ICD-10-CM | POA: Diagnosis not present

## 2019-02-10 DIAGNOSIS — M064 Inflammatory polyarthropathy: Secondary | ICD-10-CM | POA: Diagnosis not present

## 2019-02-10 DIAGNOSIS — M1990 Unspecified osteoarthritis, unspecified site: Secondary | ICD-10-CM | POA: Diagnosis not present

## 2019-02-11 ENCOUNTER — Other Ambulatory Visit (HOSPITAL_COMMUNITY): Payer: Self-pay

## 2019-02-11 DIAGNOSIS — R1310 Dysphagia, unspecified: Secondary | ICD-10-CM

## 2019-02-12 ENCOUNTER — Ambulatory Visit
Admission: RE | Admit: 2019-02-12 | Discharge: 2019-02-12 | Disposition: A | Discharge status: Home / Self Care | Payer: Medicare Other | Source: Ambulatory Visit | Attending: Internal Medicine | Admitting: Internal Medicine

## 2019-02-12 ENCOUNTER — Ambulatory Visit
Admission: RE | Admit: 2019-02-12 | Discharge: 2019-02-12 | Disposition: A | Discharge status: Home / Self Care | Payer: BLUE CROSS/BLUE SHIELD | Source: Ambulatory Visit | Attending: Internal Medicine | Admitting: Internal Medicine

## 2019-02-12 DIAGNOSIS — M7989 Other specified soft tissue disorders: Secondary | ICD-10-CM | POA: Diagnosis not present

## 2019-02-12 DIAGNOSIS — I872 Venous insufficiency (chronic) (peripheral): Secondary | ICD-10-CM

## 2019-02-12 DIAGNOSIS — E785 Hyperlipidemia, unspecified: Secondary | ICD-10-CM | POA: Diagnosis not present

## 2019-02-12 DIAGNOSIS — I129 Hypertensive chronic kidney disease with stage 1 through stage 4 chronic kidney disease, or unspecified chronic kidney disease: Secondary | ICD-10-CM | POA: Diagnosis not present

## 2019-02-12 DIAGNOSIS — R2243 Localized swelling, mass and lump, lower limb, bilateral: Secondary | ICD-10-CM | POA: Diagnosis not present

## 2019-02-12 DIAGNOSIS — R7309 Other abnormal glucose: Secondary | ICD-10-CM | POA: Diagnosis not present

## 2019-02-12 NOTE — Consult Note (Signed)
Chief Complaint: Patient was seen in consultation today for leg swelling at the request of Pharr,Walter  Referring Physician(s): Pharr,Walter  History of Present Illness: Donna [name] is a 73 y.o. female with one episode of bilateral lower extremity swelling in May for one week, which resolved. No visible varicose veins. No prior varicose or spider vein treaments. No history of PE, DVT, or thrombophlebitis. No family history of varicose or spider veins. No use of support hose. No need for pain medications.   Past Medical History:  Diagnosis Date   Arthritis    lumbar, toes, L knee, cerv. spine & hands    Cancer ([place])    basal cell removed fr. face    COPD (chronic obstructive pulmonary disease) ([facility])    GERD (gastroesophageal reflux disease)    H/O echocardiogram    Hiatal hernia    History of hiatal hernia    Hypertension    Hypertension    currently followed for BP / heart management with Dr. Leanora Ivanoff, but prev. saw Dr. Einar Gip    Past Surgical History:  Procedure Laterality Date   BACK SURGERY  1994   cerv. spine fusion    BREAST ENHANCEMENT SURGERY     BREAST SURGERY  1980's   implants- bilateral    CERVICAL SPINE SURGERY  1992   fusion   JOINT REPLACEMENT Right 2007   LUMBAR LAMINECTOMY/DECOMPRESSION MICRODISCECTOMY Left 10/07/2015   Procedure: Lumbar three-five Decompressive lumbar Laminectomy & Left Lumbar four-five Microdiscectomy;  Surgeon: Jovita Gamma, MD;  Location: [facility];  Service: Neurosurgery;  Laterality: Left;   TONSILLECTOMY     TOTAL HIP ARTHROPLASTY  2007   Right   TUBAL LIGATION      Allergies: Thorazine [chlorpromazine]  Medications: Prior to Admission medications   Medication Sig Start Date End Date Taking? Authorizing Provider  albuterol (PROVENTIL HFA;VENTOLIN HFA) 108 (90 Base) MCG/ACT inhaler Inhale 2 puffs into the lungs every 4 (four) hours as needed for wheezing or shortness of breath.    [provider]  aspirin 81 MG tablet Take 81 mg by mouth daily.    [provider]  Cholecalciferol (VITAMIN D3 PO) Take 2,400 Units by mouth daily.    [provider]  Coenzyme Q10 (COQ-10) 100 MG CAPS Take 1 capsule by mouth daily.    [provider]  DULoxetine (CYMBALTA) 20 MG capsule Take 20 mg by mouth daily.    [provider]  Ginger, Zingiber officinalis, (GINGER ROOT PO) Take 1 capsule by mouth daily.    [provider]  HYDROcodone-acetaminophen (NORCO/VICODIN) 5-325 MG tablet Take 1 tablet by mouth every 8 (eight) hours as needed. 10/04/15   [provider]  HYDROcodone-acetaminophen (NORCO/VICODIN) 5-325 MG tablet Take 1-2 tablets by mouth every 4 (four) hours as needed (pain). 10/08/15   Jovita Gamma, MD  hydroxychloroquine (PLAQUENIL) 200 MG tablet Take by mouth daily.    [provider]  losartan (COZAAR) 50 MG tablet Take 50 mg by mouth daily before breakfast.     [provider]  magnesium gluconate (MAGONATE) 500 MG tablet Take 500 mg by mouth daily.    [provider]  metoprolol succinate (TOPROL-XL) 100 MG 24 hr tablet Take 100 mg by mouth daily after breakfast. Take with or immediately following a meal.    [provider]  Multiple Vitamins-Minerals (MULTIVITAMIN WOMEN) TABS Take 1 tablet by mouth daily.    [provider]  omeprazole (PRILOSEC) 20 MG capsule Take  20 mg by mouth daily before breakfast.     [provider]  Turmeric Curcumin 500 MG CAPS Take 1,000 mg by mouth daily.    [provider]  vitamin B-12 (CYANOCOBALAMIN) 1000 MCG tablet Take 1,000 mcg by mouth daily.    [provider]     Family History  Problem Relation Age of Onset   Heart disease Maternal Aunt    Heart disease Maternal Aunt    Lung cancer Sister    Kidney cancer Mother    Kidney cancer Maternal Uncle    Kidney cancer Maternal Aunt     Social History    Socioeconomic History   Marital status: Married    Spouse name: now divorced   Number of children: Y   Years of education: Not on file   Highest education level: Not on file  Occupational History   Occupation: Art therapist    Comment: retired  Scientist, product/process development strain: Not on file   Food insecurity    Worry: Not on file    Inability: Not on Lexicographer needs    Medical: Not on file    Non-medical: Not on file  Tobacco Use   Smoking status: Former Smoker    Packs/day: 0.70    Years: 24.00    Pack years: 16.80    Types: Cigarettes    Quit date: 07/31/2009    Years since quitting: 9.5   Smokeless tobacco: Former Systems developer    Quit date: 10/01/1998  Substance and Sexual Activity   Alcohol use: Yes    Comment: wine- almost daily   Drug use: No   Sexual activity: Not on file  Lifestyle   Physical activity    Days per week: Not on file    Minutes per session: Not on file   Stress: Not on file  Relationships   Social connections    Talks on phone: Not on file    Gets together: Not on file    Attends religious service: Not on file    Active member of club or organization: Not on file    Attends meetings of clubs or organizations: Not on file    Relationship status: Not on file  Other Topics Concern   Not on file  Social History Narrative   ** Merged History Encounter **       Pt is divorced and now lives with Donna [name].     ECOG Status: 0 - Asymptomatic   Physical Exam Vital Signs: BP (!) 172/81 (BP Location: Right Arm)    Pulse 70    Temp (!) 97.5 F (36.4 C)    SpO2 96%    Constitutional: Oriented to person, place, and time. Well-developed and well-nourished. No distress.   HENT:  Head: Normocephalic and atraumatic.  Eyes: Conjunctivae and EOM are normal. Right eye exhibits no discharge. Left eye exhibits no discharge. No scleral icterus.  Neck: No JVD present.  Pulmonary/Chest: Effort normal. No stridor. No  respiratory distress.  Abdomen: soft, non distended Neurological:  alert and oriented to person, place, and time.  Skin: Skin is warm and dry.  not diaphoretic. No visible varicose veins.  Psychiatric:   normal mood and affect.   behavior is normal. Judgment and thought content normal.    Mallampati Score: Review of Systems   Review of Systems: A 12 point ROS discussed and pertinent positives are indicated in the HPI above.  All other systems are negative.  Imaging: US Venous Img Lower Bilateral  Result Date: 02/12/2019 CLINICAL DATA:  Episode of bilateral leg swelling which has resolved. No visible varicose veins. EXAM: BILATERAL LOWER EXTREMITY VENOUS DUPLEX ULTRASOUND TECHNIQUE: Gray-scale sonography with graded compression, as well as color Doppler and duplex ultrasound, were performed to evaluate the deep and superficial veins of both lower extremities. Spectral Doppler was utilized to evaluate flow at rest and with distal augmentation maneuvers. A complete superficial venous insufficiency exam was performed in the upright standing position. I personally performed the technical portion of the exam. COMPARISON:  None. FINDINGS: Deep Venous System: Evaluation of the deep venous systems including the common femoral, femoral, profunda femoral, popliteal and calf veins (where visible) demonstrate no evidence of deep venous thrombosis. The vessels are compressible and demonstrate normal respiratory phasicity and response to augmentation. No evidence of the deep venous reflux. Superficial Venous System: RIGHT SFJ: Patent, normal caliber, no reflux GSV Prox Thigh: Negative GSV Mid Thigh: Negative GSV Lower Thigh: Negative GSV at Knee: Negative GSV Prox Calf: Negative GSV Mid Calf: Negative GSV Distal Calf: Negative SPJ: Not visualized SSV Prox: Negative SSV Mid: Negative SSV Distal: Negative LEFT SFJ: Patent, normal caliber, no reflux GSV Prox Thigh: Negative GSV Mid Thigh: Negative GSV Lower Thigh:  Negative GSV at Knee: Negative GSV Prox Calf: Negative GSV Mid Calf: Negative GSV Distal Calf: Negative SPJ: Not visualized SSV Prox: Negative SSV Mid: Negative SSV Distal: Negative Other: None IMPRESSION: 1. No femoropopliteal and no calf DVT in the visualized calf veins. If clinical symptoms are inconsistent or if there are persistent or worsening symptoms, further imaging (possibly involving the iliac veins) may be warranted. 2. No evidence of lower extremity saphenous venous valvular incompetence or reflux. Electronically Signed   By: Lucrezia Europe M.D.   On: 02/12/2019 09:57   [place] [facility] Eval And [facility]mt  Result Date: 02/12/2019 Please refer to "Notes" to see consult details.   Labs:  CBC: No results for input(s): WBC, HGB, HCT, PLT in the last 8760 hours.  COAGS: No results for input(s): INR, APTT in the last 8760 hours.  BMP: No results for input(s): NA, K, CL, CO2, GLUCOSE, BUN, CALCIUM, CREATININE, GFRNONAA, GFRAA in the last 8760 hours.  Invalid input(s): CMP  LIVER FUNCTION TESTS: No results for input(s): BILITOT, AST, ALT, ALKPHOS, PROT, ALBUMIN in the last 8760 hours.  TUMOR MARKERS: No results for input(s): AFPTM, CEA, CA199, CHROMGRNA in the last 8760 hours.  Assessment and Plan:  My impression is that her leg swelling is not secondary to lower extremity DVT nor to saphenous venous valvular incompetence or reflux.  Findings and impression were reviewed with the patient. She will follow up primarily with her PCP. Thank you for this interesting consult.  I greatly enjoyed meeting NATAHSA MARIAN and look forward to participating in their care.  A copy of this report was sent to the requesting provider on this date.  Electronically Signed: Rickard Rhymes 02/12/2019, 11:12 AM   I spent a total of  30 Minutes   in face to face in clinical consultation, greater than 50% of which was counseling/coordinating care for leg swelling.

## 2019-02-19 DIAGNOSIS — R7309 Other abnormal glucose: Secondary | ICD-10-CM | POA: Diagnosis not present

## 2019-02-19 DIAGNOSIS — Z0000 Encounter for general adult medical examination without abnormal findings: Secondary | ICD-10-CM | POA: Diagnosis not present

## 2019-02-19 DIAGNOSIS — E785 Hyperlipidemia, unspecified: Secondary | ICD-10-CM | POA: Diagnosis not present

## 2019-02-19 DIAGNOSIS — I129 Hypertensive chronic kidney disease with stage 1 through stage 4 chronic kidney disease, or unspecified chronic kidney disease: Secondary | ICD-10-CM | POA: Diagnosis not present

## 2019-02-24 DIAGNOSIS — L03115 Cellulitis of right lower limb: Secondary | ICD-10-CM | POA: Diagnosis not present

## 2019-02-24 DIAGNOSIS — S91311A Laceration without foreign body, right foot, initial encounter: Secondary | ICD-10-CM | POA: Diagnosis not present

## 2019-02-26 ENCOUNTER — Ambulatory Visit (HOSPITAL_COMMUNITY): Admission: RE | Admit: 2019-02-26 | Payer: Medicare Other | Source: Ambulatory Visit

## 2019-02-26 ENCOUNTER — Other Ambulatory Visit: Payer: Self-pay

## 2019-02-26 ENCOUNTER — Encounter (HOSPITAL_COMMUNITY): Payer: Self-pay

## 2019-02-28 DIAGNOSIS — S99921D Unspecified injury of right foot, subsequent encounter: Secondary | ICD-10-CM | POA: Diagnosis not present

## 2019-02-28 DIAGNOSIS — M7989 Other specified soft tissue disorders: Secondary | ICD-10-CM | POA: Diagnosis not present

## 2019-02-28 DIAGNOSIS — L03115 Cellulitis of right lower limb: Secondary | ICD-10-CM | POA: Insufficient documentation

## 2019-02-28 DIAGNOSIS — I129 Hypertensive chronic kidney disease with stage 1 through stage 4 chronic kidney disease, or unspecified chronic kidney disease: Secondary | ICD-10-CM | POA: Diagnosis not present

## 2019-02-28 DIAGNOSIS — M069 Rheumatoid arthritis, unspecified: Secondary | ICD-10-CM | POA: Diagnosis not present

## 2019-02-28 DIAGNOSIS — M79671 Pain in right foot: Secondary | ICD-10-CM | POA: Diagnosis not present

## 2019-02-28 DIAGNOSIS — S99921A Unspecified injury of right foot, initial encounter: Secondary | ICD-10-CM | POA: Diagnosis not present

## 2019-03-04 DIAGNOSIS — S99921D Unspecified injury of right foot, subsequent encounter: Secondary | ICD-10-CM | POA: Diagnosis not present

## 2019-03-05 DIAGNOSIS — L03115 Cellulitis of right lower limb: Secondary | ICD-10-CM | POA: Diagnosis not present

## 2019-03-07 DIAGNOSIS — L97519 Non-pressure chronic ulcer of other part of right foot with unspecified severity: Secondary | ICD-10-CM | POA: Diagnosis not present

## 2019-03-11 DIAGNOSIS — L97512 Non-pressure chronic ulcer of other part of right foot with fat layer exposed: Secondary | ICD-10-CM | POA: Diagnosis not present

## 2019-03-11 DIAGNOSIS — I739 Peripheral vascular disease, unspecified: Secondary | ICD-10-CM | POA: Diagnosis not present

## 2019-03-11 DIAGNOSIS — L97812 Non-pressure chronic ulcer of other part of right lower leg with fat layer exposed: Secondary | ICD-10-CM | POA: Diagnosis not present

## 2019-03-13 DIAGNOSIS — L97512 Non-pressure chronic ulcer of other part of right foot with fat layer exposed: Secondary | ICD-10-CM | POA: Diagnosis not present

## 2019-03-13 DIAGNOSIS — L97812 Non-pressure chronic ulcer of other part of right lower leg with fat layer exposed: Secondary | ICD-10-CM | POA: Diagnosis not present

## 2019-03-18 ENCOUNTER — Encounter (HOSPITAL_BASED_OUTPATIENT_CLINIC_OR_DEPARTMENT_OTHER): Payer: Medicare Other | Attending: Internal Medicine

## 2019-03-18 DIAGNOSIS — L97812 Non-pressure chronic ulcer of other part of right lower leg with fat layer exposed: Secondary | ICD-10-CM | POA: Diagnosis not present

## 2019-03-18 DIAGNOSIS — L97512 Non-pressure chronic ulcer of other part of right foot with fat layer exposed: Secondary | ICD-10-CM | POA: Diagnosis not present

## 2019-03-18 DIAGNOSIS — M069 Rheumatoid arthritis, unspecified: Secondary | ICD-10-CM | POA: Diagnosis not present

## 2019-03-18 DIAGNOSIS — I739 Peripheral vascular disease, unspecified: Secondary | ICD-10-CM | POA: Diagnosis not present

## 2019-03-24 ENCOUNTER — Ambulatory Visit (HOSPITAL_COMMUNITY)
Admission: RE | Admit: 2019-03-24 | Discharge: 2019-03-24 | Disposition: A | Discharge status: Home / Self Care | Payer: Medicare Other | Source: Ambulatory Visit | Attending: Internal Medicine | Admitting: Internal Medicine

## 2019-03-24 ENCOUNTER — Other Ambulatory Visit: Payer: Self-pay

## 2019-03-24 DIAGNOSIS — Z8719 Personal history of other diseases of the digestive system: Secondary | ICD-10-CM | POA: Diagnosis not present

## 2019-03-24 DIAGNOSIS — R1310 Dysphagia, unspecified: Secondary | ICD-10-CM | POA: Diagnosis not present

## 2019-03-25 DIAGNOSIS — L97512 Non-pressure chronic ulcer of other part of right foot with fat layer exposed: Secondary | ICD-10-CM | POA: Diagnosis not present

## 2019-03-25 DIAGNOSIS — I739 Peripheral vascular disease, unspecified: Secondary | ICD-10-CM | POA: Diagnosis not present

## 2019-03-25 DIAGNOSIS — L97812 Non-pressure chronic ulcer of other part of right lower leg with fat layer exposed: Secondary | ICD-10-CM | POA: Diagnosis not present

## 2019-03-26 DIAGNOSIS — L97512 Non-pressure chronic ulcer of other part of right foot with fat layer exposed: Secondary | ICD-10-CM | POA: Insufficient documentation

## 2019-03-28 DIAGNOSIS — I739 Peripheral vascular disease, unspecified: Secondary | ICD-10-CM | POA: Diagnosis not present

## 2019-03-28 DIAGNOSIS — Z20828 Contact with and (suspected) exposure to other viral communicable diseases: Secondary | ICD-10-CM | POA: Diagnosis not present

## 2019-03-28 DIAGNOSIS — Z01812 Encounter for preprocedural laboratory examination: Secondary | ICD-10-CM | POA: Diagnosis not present

## 2019-03-28 DIAGNOSIS — L97812 Non-pressure chronic ulcer of other part of right lower leg with fat layer exposed: Secondary | ICD-10-CM | POA: Diagnosis not present

## 2019-03-28 DIAGNOSIS — L97512 Non-pressure chronic ulcer of other part of right foot with fat layer exposed: Secondary | ICD-10-CM | POA: Diagnosis not present

## 2019-04-03 DIAGNOSIS — Z23 Encounter for immunization: Secondary | ICD-10-CM | POA: Diagnosis not present

## 2019-04-03 DIAGNOSIS — I774 Celiac artery compression syndrome: Secondary | ICD-10-CM | POA: Diagnosis not present

## 2019-04-03 DIAGNOSIS — S3012XA Contusion of groin, initial encounter: Secondary | ICD-10-CM | POA: Insufficient documentation

## 2019-04-03 DIAGNOSIS — S91301A Unspecified open wound, right foot, initial encounter: Secondary | ICD-10-CM | POA: Diagnosis not present

## 2019-04-03 DIAGNOSIS — L97512 Non-pressure chronic ulcer of other part of right foot with fat layer exposed: Secondary | ICD-10-CM | POA: Diagnosis not present

## 2019-04-03 DIAGNOSIS — S301XXA Contusion of abdominal wall, initial encounter: Secondary | ICD-10-CM | POA: Diagnosis not present

## 2019-04-03 DIAGNOSIS — I743 Embolism and thrombosis of arteries of the lower extremities: Secondary | ICD-10-CM | POA: Diagnosis not present

## 2019-04-03 DIAGNOSIS — S81801D Unspecified open wound, right lower leg, subsequent encounter: Secondary | ICD-10-CM | POA: Diagnosis not present

## 2019-04-03 DIAGNOSIS — I739 Peripheral vascular disease, unspecified: Secondary | ICD-10-CM | POA: Diagnosis not present

## 2019-04-04 DIAGNOSIS — L97512 Non-pressure chronic ulcer of other part of right foot with fat layer exposed: Secondary | ICD-10-CM | POA: Diagnosis not present

## 2019-04-04 DIAGNOSIS — S301XXA Contusion of abdominal wall, initial encounter: Secondary | ICD-10-CM | POA: Diagnosis not present

## 2019-04-04 DIAGNOSIS — Z23 Encounter for immunization: Secondary | ICD-10-CM | POA: Diagnosis not present

## 2019-04-04 DIAGNOSIS — I739 Peripheral vascular disease, unspecified: Secondary | ICD-10-CM | POA: Diagnosis not present

## 2019-04-08 DIAGNOSIS — Z1231 Encounter for screening mammogram for malignant neoplasm of breast: Secondary | ICD-10-CM | POA: Diagnosis not present

## 2019-04-10 DIAGNOSIS — I739 Peripheral vascular disease, unspecified: Secondary | ICD-10-CM | POA: Diagnosis not present

## 2019-04-10 DIAGNOSIS — L97812 Non-pressure chronic ulcer of other part of right lower leg with fat layer exposed: Secondary | ICD-10-CM | POA: Diagnosis not present

## 2019-04-10 DIAGNOSIS — L97512 Non-pressure chronic ulcer of other part of right foot with fat layer exposed: Secondary | ICD-10-CM | POA: Diagnosis not present

## 2019-04-10 DIAGNOSIS — L739 Follicular disorder, unspecified: Secondary | ICD-10-CM | POA: Diagnosis not present

## 2019-04-11 DIAGNOSIS — L97512 Non-pressure chronic ulcer of other part of right foot with fat layer exposed: Secondary | ICD-10-CM | POA: Diagnosis not present

## 2019-04-15 DIAGNOSIS — I10 Essential (primary) hypertension: Secondary | ICD-10-CM | POA: Diagnosis not present

## 2019-04-15 DIAGNOSIS — I129 Hypertensive chronic kidney disease with stage 1 through stage 4 chronic kidney disease, or unspecified chronic kidney disease: Secondary | ICD-10-CM | POA: Diagnosis not present

## 2019-04-15 DIAGNOSIS — Z7189 Other specified counseling: Secondary | ICD-10-CM | POA: Diagnosis not present

## 2019-04-16 DIAGNOSIS — L820 Inflamed seborrheic keratosis: Secondary | ICD-10-CM | POA: Diagnosis not present

## 2019-04-16 DIAGNOSIS — L718 Other rosacea: Secondary | ICD-10-CM | POA: Diagnosis not present

## 2019-04-16 DIAGNOSIS — L821 Other seborrheic keratosis: Secondary | ICD-10-CM | POA: Diagnosis not present

## 2019-04-16 DIAGNOSIS — Z1283 Encounter for screening for malignant neoplasm of skin: Secondary | ICD-10-CM | POA: Diagnosis not present

## 2019-04-21 DIAGNOSIS — L97512 Non-pressure chronic ulcer of other part of right foot with fat layer exposed: Secondary | ICD-10-CM | POA: Diagnosis not present

## 2019-04-21 DIAGNOSIS — I739 Peripheral vascular disease, unspecified: Secondary | ICD-10-CM | POA: Diagnosis not present

## 2019-04-21 DIAGNOSIS — Z87828 Personal history of other (healed) physical injury and trauma: Secondary | ICD-10-CM | POA: Diagnosis not present

## 2019-04-21 DIAGNOSIS — L97211 Non-pressure chronic ulcer of right calf limited to breakdown of skin: Secondary | ICD-10-CM | POA: Diagnosis not present

## 2019-05-06 DIAGNOSIS — L97211 Non-pressure chronic ulcer of right calf limited to breakdown of skin: Secondary | ICD-10-CM | POA: Diagnosis not present

## 2019-05-06 DIAGNOSIS — I70211 Atherosclerosis of native arteries of extremities with intermittent claudication, right leg: Secondary | ICD-10-CM | POA: Diagnosis not present

## 2019-05-06 DIAGNOSIS — L97512 Non-pressure chronic ulcer of other part of right foot with fat layer exposed: Secondary | ICD-10-CM | POA: Diagnosis not present

## 2019-05-06 DIAGNOSIS — I739 Peripheral vascular disease, unspecified: Secondary | ICD-10-CM | POA: Diagnosis not present

## 2019-05-12 DIAGNOSIS — K219 Gastro-esophageal reflux disease without esophagitis: Secondary | ICD-10-CM | POA: Diagnosis not present

## 2019-05-12 DIAGNOSIS — R1310 Dysphagia, unspecified: Secondary | ICD-10-CM | POA: Diagnosis not present

## 2019-05-21 ENCOUNTER — Other Ambulatory Visit: Payer: Self-pay | Admitting: Gastroenterology

## 2019-05-21 DIAGNOSIS — R1310 Dysphagia, unspecified: Secondary | ICD-10-CM

## 2019-05-21 DIAGNOSIS — R1319 Other dysphagia: Secondary | ICD-10-CM

## 2019-06-09 DIAGNOSIS — Z79899 Other long term (current) drug therapy: Secondary | ICD-10-CM | POA: Diagnosis not present

## 2019-06-09 DIAGNOSIS — H2513 Age-related nuclear cataract, bilateral: Secondary | ICD-10-CM | POA: Diagnosis not present

## 2019-06-12 DIAGNOSIS — M1990 Unspecified osteoarthritis, unspecified site: Secondary | ICD-10-CM | POA: Diagnosis not present

## 2019-06-12 DIAGNOSIS — M064 Inflammatory polyarthropathy: Secondary | ICD-10-CM | POA: Diagnosis not present

## 2019-06-12 DIAGNOSIS — M359 Systemic involvement of connective tissue, unspecified: Secondary | ICD-10-CM | POA: Diagnosis not present

## 2019-06-12 DIAGNOSIS — M79643 Pain in unspecified hand: Secondary | ICD-10-CM | POA: Diagnosis not present

## 2019-06-18 DIAGNOSIS — M064 Inflammatory polyarthropathy: Secondary | ICD-10-CM | POA: Diagnosis not present

## 2019-07-11 DIAGNOSIS — M0579 Rheumatoid arthritis with rheumatoid factor of multiple sites without organ or systems involvement: Secondary | ICD-10-CM | POA: Diagnosis not present

## 2019-07-11 DIAGNOSIS — M797 Fibromyalgia: Secondary | ICD-10-CM | POA: Diagnosis not present

## 2019-07-11 DIAGNOSIS — M1990 Unspecified osteoarthritis, unspecified site: Secondary | ICD-10-CM | POA: Diagnosis not present

## 2019-07-11 DIAGNOSIS — Z79899 Other long term (current) drug therapy: Secondary | ICD-10-CM | POA: Diagnosis not present

## 2019-08-14 DIAGNOSIS — I129 Hypertensive chronic kidney disease with stage 1 through stage 4 chronic kidney disease, or unspecified chronic kidney disease: Secondary | ICD-10-CM | POA: Diagnosis not present

## 2019-08-14 DIAGNOSIS — E785 Hyperlipidemia, unspecified: Secondary | ICD-10-CM | POA: Diagnosis not present

## 2019-08-14 DIAGNOSIS — R7309 Other abnormal glucose: Secondary | ICD-10-CM | POA: Diagnosis not present

## 2019-08-14 DIAGNOSIS — E041 Nontoxic single thyroid nodule: Secondary | ICD-10-CM | POA: Diagnosis not present

## 2019-08-22 DIAGNOSIS — E785 Hyperlipidemia, unspecified: Secondary | ICD-10-CM | POA: Diagnosis not present

## 2019-08-22 DIAGNOSIS — R7309 Other abnormal glucose: Secondary | ICD-10-CM | POA: Diagnosis not present

## 2019-08-22 DIAGNOSIS — I10 Essential (primary) hypertension: Secondary | ICD-10-CM | POA: Diagnosis not present

## 2019-08-22 DIAGNOSIS — J309 Allergic rhinitis, unspecified: Secondary | ICD-10-CM | POA: Diagnosis not present

## 2019-09-10 DIAGNOSIS — R1010 Upper abdominal pain, unspecified: Secondary | ICD-10-CM | POA: Diagnosis not present

## 2019-09-10 DIAGNOSIS — R112 Nausea with vomiting, unspecified: Secondary | ICD-10-CM | POA: Diagnosis not present

## 2019-09-10 DIAGNOSIS — R6881 Early satiety: Secondary | ICD-10-CM | POA: Diagnosis not present

## 2019-09-12 ENCOUNTER — Other Ambulatory Visit: Payer: Self-pay | Admitting: Physician Assistant

## 2019-09-12 DIAGNOSIS — R1010 Upper abdominal pain, unspecified: Secondary | ICD-10-CM

## 2019-09-12 DIAGNOSIS — R112 Nausea with vomiting, unspecified: Secondary | ICD-10-CM

## 2019-09-12 DIAGNOSIS — M0579 Rheumatoid arthritis with rheumatoid factor of multiple sites without organ or systems involvement: Secondary | ICD-10-CM | POA: Diagnosis not present

## 2019-09-12 DIAGNOSIS — M1990 Unspecified osteoarthritis, unspecified site: Secondary | ICD-10-CM | POA: Diagnosis not present

## 2019-09-12 DIAGNOSIS — M79643 Pain in unspecified hand: Secondary | ICD-10-CM | POA: Diagnosis not present

## 2019-09-12 DIAGNOSIS — Z79899 Other long term (current) drug therapy: Secondary | ICD-10-CM | POA: Diagnosis not present

## 2019-09-12 DIAGNOSIS — M25562 Pain in left knee: Secondary | ICD-10-CM | POA: Diagnosis not present

## 2019-09-12 DIAGNOSIS — M797 Fibromyalgia: Secondary | ICD-10-CM | POA: Diagnosis not present

## 2019-09-13 ENCOUNTER — Other Ambulatory Visit: Payer: Self-pay

## 2019-09-13 ENCOUNTER — Ambulatory Visit: Payer: Medicare Other | Attending: Internal Medicine

## 2019-09-13 DIAGNOSIS — Z23 Encounter for immunization: Secondary | ICD-10-CM

## 2019-09-13 NOTE — Progress Notes (Signed)
   Covid-19 Vaccination Clinic  Name:  MONTA ZISMAN    MRN: [id number] DOB: 1946/06/26  09/13/2019  Ms. Dillon was observed post Covid-19 immunization for 15 minutes without incidence. She was provided with Vaccine Information Sheet and instruction to access the V-Safe system.   Ms. Cadavid was instructed to call 911 with any severe reactions post vaccine: Marland Kitchen Difficulty breathing  . Swelling of your face and throat  . A fast heartbeat  . A bad rash all over your body  . Dizziness and weakness    Immunizations Administered    Name Date Dose VIS Date Route   Pfizer COVID-19 Vaccine 09/13/2019  9:37 AM 0.3 mL 07/11/2019 Intramuscular   Manufacturer: [place]   Lot: [id number]   [place]: [id number]

## 2019-09-15 DIAGNOSIS — R1010 Upper abdominal pain, unspecified: Secondary | ICD-10-CM | POA: Diagnosis not present

## 2019-09-30 ENCOUNTER — Ambulatory Visit
Admission: RE | Admit: 2019-09-30 | Discharge: 2019-09-30 | Disposition: A | Discharge status: Home / Self Care | Payer: Medicare Other | Source: Ambulatory Visit | Attending: Physician Assistant | Admitting: Physician Assistant

## 2019-09-30 DIAGNOSIS — K760 Fatty (change of) liver, not elsewhere classified: Secondary | ICD-10-CM | POA: Diagnosis not present

## 2019-09-30 DIAGNOSIS — R112 Nausea with vomiting, unspecified: Secondary | ICD-10-CM

## 2019-09-30 DIAGNOSIS — R1010 Upper abdominal pain, unspecified: Secondary | ICD-10-CM

## 2019-10-01 DIAGNOSIS — R6881 Early satiety: Secondary | ICD-10-CM | POA: Diagnosis not present

## 2019-10-01 DIAGNOSIS — R112 Nausea with vomiting, unspecified: Secondary | ICD-10-CM | POA: Diagnosis not present

## 2019-10-01 DIAGNOSIS — R634 Abnormal weight loss: Secondary | ICD-10-CM | POA: Diagnosis not present

## 2019-10-01 DIAGNOSIS — R1010 Upper abdominal pain, unspecified: Secondary | ICD-10-CM | POA: Diagnosis not present

## 2019-10-01 DIAGNOSIS — K219 Gastro-esophageal reflux disease without esophagitis: Secondary | ICD-10-CM | POA: Diagnosis not present

## 2019-10-07 ENCOUNTER — Ambulatory Visit: Payer: Medicare Other | Attending: Internal Medicine

## 2019-10-07 DIAGNOSIS — Z23 Encounter for immunization: Secondary | ICD-10-CM

## 2019-10-07 NOTE — Progress Notes (Signed)
   Covid-19 Vaccination Clinic  Name:  Donna [name]    MRN: [id number] DOB: 1946-06-09  10/07/2019  [name] was observed post Covid-19 immunization for 30 minutes based on pre-vaccination screening without incident. She was provided with Vaccine Information Sheet and instruction to access the V-Safe system.   [name] was instructed to call 911 with any severe reactions post vaccine: Marland Kitchen Difficulty breathing  . Swelling of face and throat  . A fast heartbeat  . A bad rash all over body  . Dizziness and weakness   Immunizations Administered    Name Date Dose VIS Date Route   Pfizer COVID-19 Vaccine 10/07/2019  2:36 PM 0.3 mL 07/11/2019 Intramuscular   Manufacturer: [place]   Lot: [id number]   [place]: [id number]

## 2019-10-08 ENCOUNTER — Other Ambulatory Visit: Payer: Self-pay | Admitting: Physician Assistant

## 2019-10-08 DIAGNOSIS — R1010 Upper abdominal pain, unspecified: Secondary | ICD-10-CM

## 2019-10-10 DIAGNOSIS — M064 Inflammatory polyarthropathy: Secondary | ICD-10-CM | POA: Diagnosis not present

## 2019-10-29 ENCOUNTER — Ambulatory Visit
Admission: RE | Admit: 2019-10-29 | Discharge: 2019-10-29 | Disposition: A | Discharge status: Home / Self Care | Payer: Medicare Other | Source: Ambulatory Visit | Attending: Physician Assistant | Admitting: Physician Assistant

## 2019-10-29 DIAGNOSIS — R1010 Upper abdominal pain, unspecified: Secondary | ICD-10-CM

## 2019-10-29 DIAGNOSIS — R1110 Vomiting, unspecified: Secondary | ICD-10-CM | POA: Diagnosis not present

## 2019-10-29 DIAGNOSIS — R109 Unspecified abdominal pain: Secondary | ICD-10-CM | POA: Diagnosis not present

## 2019-10-29 MED ORDER — IOPAMIDOL (ISOVUE-300) INJECTION 61%
100.0000 mL | Freq: Once | INTRAVENOUS | Status: AC | PRN
  Administered 2019-10-29: 100 mL via INTRAVENOUS

## 2019-11-03 DIAGNOSIS — Z1159 Encounter for screening for other viral diseases: Secondary | ICD-10-CM | POA: Diagnosis not present

## 2019-11-06 DIAGNOSIS — R1310 Dysphagia, unspecified: Secondary | ICD-10-CM | POA: Diagnosis not present

## 2019-11-06 DIAGNOSIS — R112 Nausea with vomiting, unspecified: Secondary | ICD-10-CM | POA: Diagnosis not present

## 2019-11-06 DIAGNOSIS — K2930 Chronic superficial gastritis without bleeding: Secondary | ICD-10-CM | POA: Diagnosis not present

## 2019-11-06 DIAGNOSIS — R1010 Upper abdominal pain, unspecified: Secondary | ICD-10-CM | POA: Diagnosis not present

## 2019-11-06 DIAGNOSIS — K317 Polyp of stomach and duodenum: Secondary | ICD-10-CM | POA: Diagnosis not present

## 2019-11-06 DIAGNOSIS — R6881 Early satiety: Secondary | ICD-10-CM | POA: Diagnosis not present

## 2019-11-06 DIAGNOSIS — K219 Gastro-esophageal reflux disease without esophagitis: Secondary | ICD-10-CM | POA: Diagnosis not present

## 2019-11-06 DIAGNOSIS — K3189 Other diseases of stomach and duodenum: Secondary | ICD-10-CM | POA: Diagnosis not present

## 2019-11-06 DIAGNOSIS — K449 Diaphragmatic hernia without obstruction or gangrene: Secondary | ICD-10-CM | POA: Diagnosis not present

## 2019-11-10 DIAGNOSIS — M797 Fibromyalgia: Secondary | ICD-10-CM | POA: Diagnosis not present

## 2019-11-10 DIAGNOSIS — M79643 Pain in unspecified hand: Secondary | ICD-10-CM | POA: Diagnosis not present

## 2019-11-10 DIAGNOSIS — M25562 Pain in left knee: Secondary | ICD-10-CM | POA: Diagnosis not present

## 2019-11-10 DIAGNOSIS — Z79899 Other long term (current) drug therapy: Secondary | ICD-10-CM | POA: Diagnosis not present

## 2019-11-10 DIAGNOSIS — M1990 Unspecified osteoarthritis, unspecified site: Secondary | ICD-10-CM | POA: Diagnosis not present

## 2019-11-10 DIAGNOSIS — M0579 Rheumatoid arthritis with rheumatoid factor of multiple sites without organ or systems involvement: Secondary | ICD-10-CM | POA: Diagnosis not present

## 2019-11-12 DIAGNOSIS — K2930 Chronic superficial gastritis without bleeding: Secondary | ICD-10-CM | POA: Diagnosis not present

## 2019-11-12 DIAGNOSIS — K219 Gastro-esophageal reflux disease without esophagitis: Secondary | ICD-10-CM | POA: Diagnosis not present

## 2019-11-12 DIAGNOSIS — K317 Polyp of stomach and duodenum: Secondary | ICD-10-CM | POA: Diagnosis not present

## 2019-12-12 DIAGNOSIS — K589 Irritable bowel syndrome without diarrhea: Secondary | ICD-10-CM | POA: Diagnosis not present

## 2019-12-12 DIAGNOSIS — K219 Gastro-esophageal reflux disease without esophagitis: Secondary | ICD-10-CM | POA: Diagnosis not present

## 2019-12-12 DIAGNOSIS — K2970 Gastritis, unspecified, without bleeding: Secondary | ICD-10-CM | POA: Diagnosis not present

## 2019-12-25 ENCOUNTER — Ambulatory Visit (INDEPENDENT_AMBULATORY_CARE_PROVIDER_SITE_OTHER): Payer: Medicare Other

## 2019-12-25 ENCOUNTER — Other Ambulatory Visit: Payer: Self-pay

## 2019-12-25 ENCOUNTER — Ambulatory Visit (INDEPENDENT_AMBULATORY_CARE_PROVIDER_SITE_OTHER): Payer: Medicare Other | Admitting: Internal Medicine

## 2019-12-25 ENCOUNTER — Encounter: Payer: Self-pay | Admitting: Internal Medicine

## 2019-12-25 DIAGNOSIS — J449 Chronic obstructive pulmonary disease, unspecified: Secondary | ICD-10-CM | POA: Diagnosis not present

## 2019-12-25 DIAGNOSIS — I10 Essential (primary) hypertension: Secondary | ICD-10-CM | POA: Diagnosis not present

## 2019-12-25 DIAGNOSIS — R05 Cough: Secondary | ICD-10-CM | POA: Diagnosis not present

## 2019-12-25 MED ORDER — BISOPROLOL FUMARATE 5 MG PO TABS: 5.0000 mg | ORAL_TABLET | Freq: Every day | ORAL | 11 refills | Status: DC

## 2019-12-25 MED ORDER — BREZTRI AEROSPHERE 160-9-4.8 MCG/ACT IN AERO: 2.0000 | INHALATION_SPRAY | Freq: Two times a day (BID) | RESPIRATORY_TRACT | 11 refills | Status: DC

## 2019-12-25 MED ORDER — BREZTRI AEROSPHERE 160-9-4.8 MCG/ACT IN AERO: 2.0000 | INHALATION_SPRAY | Freq: Two times a day (BID) | RESPIRATORY_TRACT | 0 refills | Status: DC

## 2019-12-25 NOTE — Progress Notes (Addendum)
Donna [name], female    DOB: December 31, 1945,     MRN: [id number]   Brief patient profile:  58 yowf quit smoking 2011 with eval by K Clance GOLD 2 no change with spiriva so d/c'd just used ventolin sporadically but around summer 2020 moved to [facility] which is normally just summer vacation spot and there experenced cough/wheeze / sob rx more albuterol up to twice daily so self referrd back to pulmonary clinic 12/25/2019    History of Present Illness  12/25/2019  Pulmonary/ 1st office eval/Bailea Beed / s/p 2nd [place]  Chief Complaint  Patient presents with  . Consult    pt states uses rescue inhaler everyday. pt states when doing activities sob  Dyspnea:  MMRC2 = can't walk a nl pace on a flat grade s sob but does fine slow and flat  Cough: white phelgm worse in am  Sleep: flat, sleeps two pillows  SABA use: twice daily  Also has omeprazole but not using ac  No obvious day to day or daytime variability or assoc excess/ purulent sputum or mucus plugs or hemoptysis or cp or chest tightness, subjective wheeze or overt sinus or hb symptoms.   sleeping without nocturnal  or early am exacerbation  of respiratory  c/o's or need for noct saba. Also denies any obvious fluctuation of symptoms with weather or environmental changes or other aggravating or alleviating factors except as outlined above   No unusual exposure hx or h/o childhood pna/ asthma or knowledge of premature birth.  Current Allergies, Complete Past Medical History, Past Surgical History, Family History, and Social History were reviewed in [employer] record.  ROS  The following are not active complaints unless bolded Hoarseness, sore throat, dysphagia, dental problems, itching, sneezing,  nasal congestion or discharge of excess mucus or purulent secretions, ear ache,   fever, chills, sweats, unintended wt loss or wt gain, classically pleuritic or exertional cp,  orthopnea pnd or arm/hand swelling  or leg swelling,  presyncope, palpitations, abdominal pain, anorexia, nausea, vomiting, diarrhea  or change in bowel habits or change in bladder habits, change in stools or change in urine, dysuria, hematuria,  rash, arthralgias, visual complaints, headache, numbness, weakness or ataxia or problems with walking or coordination,  change in mood or  memory.             Past Medical History:  Diagnosis Date  . Arthritis    lumbar, toes, L knee, cerv. spine & hands   . Cancer ([place])    basal cell removed fr. face   . COPD (chronic obstructive pulmonary disease) ([place])   . GERD (gastroesophageal reflux disease)   . H/O echocardiogram   . Hiatal hernia   . History of hiatal hernia   . Hypertension   . Hypertension    currently followed for BP / heart management with Dr. Leanora Ivanoff, but prev. saw Dr. Einar Gip    Outpatient Medications Prior to Visit  Medication Sig Dispense Refill  . albuterol (PROVENTIL HFA;VENTOLIN HFA) 108 (90 Base) MCG/ACT inhaler Inhale 2 puffs into the lungs every 4 (four) hours as needed for wheezing or shortness of breath.    Marland Kitchen aspirin 81 MG tablet Take 81 mg by mouth daily.    . Cholecalciferol (VITAMIN D3 PO) Take 2,400 Units by mouth daily.    . Coenzyme Q10 (COQ-10) 100 MG CAPS Take 1 capsule by mouth daily.    . DULoxetine (CYMBALTA) 20 MG capsule Take 20 mg by mouth daily.    Marland Kitchen  hydroxychloroquine (PLAQUENIL) 200 MG tablet Take by mouth daily.    Marland Kitchen losartan (COZAAR) 50 MG tablet Take 50 mg by mouth daily before breakfast.     . Multiple Vitamins-Minerals (MULTIVITAMIN WOMEN) TABS Take 1 tablet by mouth daily.    Marland Kitchen omeprazole (PRILOSEC) 20 MG capsule Take 20 mg by mouth daily before breakfast.     . vitamin B-12 (CYANOCOBALAMIN) 1000 MCG tablet Take 1,000 mcg by mouth daily.    Marland Kitchen alendronate (FOSAMAX) 70 MG tablet Take 70 mg by mouth once a week.    Marland Kitchen amLODipine (NORVASC) 5 MG tablet Take 5 mg by mouth daily.    . Ginger, Zingiber officinalis, (GINGER ROOT PO) Take 1 capsule by  mouth daily.    Marland Kitchen HYDROcodone-acetaminophen (NORCO/VICODIN) 5-325 MG tablet Take 1 tablet by mouth every 8 (eight) hours as needed.  0  . HYDROcodone-acetaminophen (NORCO/VICODIN) 5-325 MG tablet Take 1-2 tablets by mouth every 4 (four) hours as needed (pain). (Patient not taking: Reported on 12/25/2019) 50 tablet 0  . leflunomide (ARAVA) 20 MG tablet Take 20 mg by mouth daily.    . magnesium gluconate (MAGONATE) 500 MG tablet Take 500 mg by mouth daily.    . metoprolol succinate (TOPROL-XL) 100 MG 24 hr tablet Take 100 mg by mouth daily after breakfast. Take with or immediately following a meal.    . Turmeric Curcumin 500 MG CAPS Take 1,000 mg by mouth daily.        Objective:     BP 126/74 (BP Location: Left Arm, Cuff Size: Normal)   Pulse 75   Temp 98.7 F (37.1 C) (Oral)   Ht 5\' 7"  (1.702 m)   Wt 172 lb (78 kg)   SpO2 96%   BMI 26.94 kg/m   SpO2: 96 %   amb pleasant wf nad  HEENT : pt wearing mask not removed for exam due to covid - 19 concerns.   NECK :  without JVD/Nodes/TM/ nl carotid upstrokes bilaterally   LUNGS: no acc muscle use,  Min barrel  contour chest wall with lated exp wheeze/cough   min  Hyperresonant  to  percussion bilaterally     CV:  RRR  no s3 or murmur or increase in P2, and no edema   ABD:  soft and nontender with pos end  insp Hoover's  in the supine position. No bruits or organomegaly appreciated, bowel sounds nl  MS:   Nl gait/  ext warm without deformities, calf tenderness, cyanosis or clubbing No obvious joint restrictions   SKIN: warm and dry without lesions    NEURO:  alert, approp, nl sensorium with  no motor or cerebellar deficits apparent.         CXR PA and Lateral:   12/25/2019 :    I personally reviewed images  With impression as follows:   Mod copd changes, nothing acute          Assessment   COPD GOLD 2   Quit smoking in 2011  PFT's 2012: FEV1 1.77 (74%), ratio 55, +airtrapping, TLC normal, DLCO 55% - 12/25/2019   After extensive coaching inhaler device,  effectiveness =    50% > breztri Take 2 puffs first thing in am and then another 2 puffs about 12 hours later.    When respiratory symptoms begin or become refractory well after a patient reports complete smoking cessation,  Especially when this wasn't the case while they were smoking, a red flag is raised based on the work of  Dr Kris Mouton which states:  if you quit smoking when your best day FEV1 is still well preserved it is highly unlikely you will progress to severe disease.  That is to say, once the smoking stops,  the symptoms should not suddenly erupt or markedly worsen.  If so, the differential diagnosis should include  obesity/deconditioning,  LPR/Reflux/Aspiration syndromes,  occult CHF, or  especially side effect of medications commonly used in this population eg high doses of metaprolol and use of biphosphonates both suspects to be considered here   >>> rec try off metaprolol and hold fosfamax x 6 weeks while max rx for gerd and trial of breztri.     Essential hypertension Changed metaprolol to bisoprolol 5 mg daily 12/25/2019 due to wheeze  In the setting of respiratory symptoms of unknown etiology,  It would be preferable to use bystolic, the most beta -1  selective Beta blocker available in sample form, with bisoprolol the most selective generic choice  on the market, at least on a trial basis, to make sure the spillover Beta 2 effects of the less specific Beta blockers are not contributing to this patient's symptoms.   >>> start bisoprolol 5 mg daily and self monitor, ok to go up to 5 mg bid if needed.      Each maintenance medication was reviewed in detail including emphasizing most importantly the difference between maintenance and prns and under what circumstances the prns are to be triggered using an action plan format where appropriate.  Total time for H and P, chart review, counseling, teaching device and generating customized AVS  unique to this new pt office visit / charting = 60 min         Christinia Gully, MD 12/25/2019

## 2019-12-25 NOTE — Patient Instructions (Addendum)
Plan A = Automatic = Always=    Breztri Take 2 puffs first thing in am and then another 2 puffs about 12 hours later.   Work on inhaler technique:  relax and gently blow all the way out then take a nice smooth deep breath back in, triggering the inhaler at same time you start breathing in.  Hold for up to 5 seconds if you can. Blow out thru nose. Rinse and gargle with water when done  Plan B = Backup (to supplement plan A, not to replace it) Only use your albuterol inhaler as a rescue medication to be used if you can't catch your breath by resting or doing a relaxed purse lip breathing pattern.  - The less you use it, the better it will work when you need it. - Ok to use the inhaler up to 2 puffs  every 4 hours if you must but call for appointment if use goes up over your usual need - Don't leave home without it !!  (think of it like the spare tire for your car)     Omeprazole Take 30-60 min before first meal of the day   Stop fosfamax until return  Stop metaprolol and start Bisoprolol 5 mg one daily (won't interfere with albuterol)    Please remember to go to the  x-ray department  for your tests - we will call you with the results when they are available     Please schedule a follow up office visit in 4 weeks, sooner if needed

## 2019-12-26 ENCOUNTER — Encounter: Payer: Self-pay | Admitting: Internal Medicine

## 2019-12-26 NOTE — Assessment & Plan Note (Signed)
Quit smoking in 2011  PFT's 2012: FEV1 1.77 (74%), ratio 55, +airtrapping, TLC normal, DLCO 55% - 12/25/2019  After extensive coaching inhaler device,  effectiveness =    50% > breztri Take 2 puffs first thing in am and then another 2 puffs about 12 hours later.    When respiratory symptoms begin or become refractory well after a patient reports complete smoking cessation,  Especially when this wasn't the case while they were smoking, a red flag is raised based on the work of Dr Kris Mouton which states:  if you quit smoking when your best day FEV1 is still well preserved it is highly unlikely you will progress to severe disease.  That is to say, once the smoking stops,  the symptoms should not suddenly erupt or markedly worsen.  If so, the differential diagnosis should include  obesity/deconditioning,  LPR/Reflux/Aspiration syndromes,  occult CHF, or  especially side effect of medications commonly used in this population eg high doses of metaprolol and use of biphosphonates both suspects to be considered here   >>> rec try off metaprolol and hold fosfamax x 6 weeks while max rx for gerd and trial of breztri.

## 2019-12-26 NOTE — Assessment & Plan Note (Signed)
Changed metaprolol to bisoprolol 5 mg daily 12/25/2019 due to wheeze  In the setting of respiratory symptoms of unknown etiology,  It would be preferable to use bystolic, the most beta -1  selective Beta blocker available in sample form, with bisoprolol the most selective generic choice  on the market, at least on a trial basis, to make sure the spillover Beta 2 effects of the less specific Beta blockers are not contributing to this patient's symptoms.   >>> start bisoprolol 5 mg daily and self monitor, ok to go up to 5 mg bid if needed.          Each maintenance medication was reviewed in detail including emphasizing most importantly the difference between maintenance and prns and under what circumstances the prns are to be triggered using an action plan format where appropriate.  Total time for H and P, chart review, counseling, teaching device and generating customized AVS unique to this new pt office visit / charting = 60 min

## 2020-01-02 ENCOUNTER — Telehealth: Payer: Self-pay | Admitting: Internal Medicine

## 2020-01-02 NOTE — Progress Notes (Signed)
LMTCB

## 2020-01-02 NOTE — Progress Notes (Signed)
Left a VM for patient to return our call to receive her CXR results.

## 2020-01-02 NOTE — Telephone Encounter (Signed)
Tanda Rockers, MD sent to Donna [name], [place]  Call pt: Reviewed cxr and no acute change so no change in recommendations made at [facility]    LMTCB x 1

## 2020-01-06 NOTE — Telephone Encounter (Signed)
Advised pt of results. Pt understood and nothing further is needed.   

## 2020-01-08 ENCOUNTER — Telehealth: Payer: Self-pay | Admitting: Internal Medicine

## 2020-01-08 MED ORDER — BREZTRI AEROSPHERE 160-9-4.8 MCG/ACT IN AERO: 2.0000 | INHALATION_SPRAY | Freq: Two times a day (BID) | RESPIRATORY_TRACT | 11 refills | Status: DC

## 2020-01-08 NOTE — Telephone Encounter (Signed)
Spoke with pt. States that she has run out of [place] and her symptoms have returned. Reports that she was doing well while on the inhaler. Rx has been sent in for [facility]. Nothing further was needed.

## 2020-01-19 ENCOUNTER — Telehealth: Payer: Self-pay

## 2020-02-13 ENCOUNTER — Ambulatory Visit (INDEPENDENT_AMBULATORY_CARE_PROVIDER_SITE_OTHER): Payer: Medicare Other | Admitting: Internal Medicine

## 2020-02-13 ENCOUNTER — Other Ambulatory Visit: Payer: Self-pay

## 2020-02-13 ENCOUNTER — Encounter: Payer: Self-pay | Admitting: Internal Medicine

## 2020-02-13 DIAGNOSIS — R0600 Dyspnea, unspecified: Secondary | ICD-10-CM | POA: Diagnosis not present

## 2020-02-13 DIAGNOSIS — I10 Essential (primary) hypertension: Secondary | ICD-10-CM

## 2020-02-13 DIAGNOSIS — J449 Chronic obstructive pulmonary disease, unspecified: Secondary | ICD-10-CM

## 2020-02-13 DIAGNOSIS — R0609 Other forms of dyspnea: Secondary | ICD-10-CM

## 2020-02-13 LAB — CBC WITH DIFFERENTIAL/PLATELET
Basophils Absolute: 0.1 10*3/uL (ref 0.0–0.1)
Basophils Relative: 2.2 % (ref 0.0–3.0)
Eosinophils Absolute: 0 10*3/uL (ref 0.0–0.7)
Eosinophils Relative: 0 % (ref 0.0–5.0)
HCT: 47.1 % — ABNORMAL HIGH (ref 36.0–46.0)
Hemoglobin: 15.9 g/dL — ABNORMAL HIGH (ref 12.0–15.0)
Lymphocytes Relative: 27.6 % (ref 12.0–46.0)
Lymphs Abs: 1.6 10*3/uL (ref 0.7–4.0)
MCHC: 33.7 g/dL (ref 30.0–36.0)
MCV: 98.3 fl (ref 78.0–100.0)
Monocytes Absolute: 1 10*3/uL (ref 0.1–1.0)
Monocytes Relative: 17 % — ABNORMAL HIGH (ref 3.0–12.0)
Neutro Abs: 3.1 10*3/uL (ref 1.4–7.7)
Neutrophils Relative %: 53.2 % (ref 43.0–77.0)
Platelets: 221 10*3/uL (ref 150.0–400.0)
RBC: 4.8 Mil/uL (ref 3.87–5.11)
RDW: 15 % (ref 11.5–15.5)
WBC: 5.8 10*3/uL (ref 4.0–10.5)

## 2020-02-13 LAB — BASIC METABOLIC PANEL
BUN: 35 mg/dL — ABNORMAL HIGH (ref 6–23)
CO2: 33 mEq/L — ABNORMAL HIGH (ref 19–32)
Calcium: 11 mg/dL — ABNORMAL HIGH (ref 8.4–10.5)
Chloride: 92 mEq/L — ABNORMAL LOW (ref 96–112)
Creatinine, Ser: 1.59 mg/dL — ABNORMAL HIGH (ref 0.40–1.20)
GFR: 31.67 mL/min — ABNORMAL LOW (ref >60.00)
Glucose, Bld: 132 mg/dL — ABNORMAL HIGH (ref 70–99)
Potassium: 3.4 mEq/L — ABNORMAL LOW (ref 3.5–5.1)
Sodium: 135 mEq/L (ref 135–145)

## 2020-02-13 LAB — SEDIMENTATION RATE: Sed Rate: 5 mm/hr (ref 0–30)

## 2020-02-13 LAB — TSH: TSH: 0.71 u[IU]/mL (ref 0.35–4.50)

## 2020-02-13 LAB — BRAIN NATRIURETIC PEPTIDE: Pro B Natriuretic peptide (BNP): 79 pg/mL (ref 0.0–100.0)

## 2020-02-13 MED ORDER — BREZTRI AEROSPHERE 160-9-4.8 MCG/ACT IN AERO: 2.0000 | INHALATION_SPRAY | Freq: Two times a day (BID) | RESPIRATORY_TRACT | 0 refills | Status: DC

## 2020-02-13 NOTE — Progress Notes (Signed)
Donna [name], female    DOB: 06-14-1946,     MRN: [id number]   Brief patient profile:  73 yowf with RA quit smoking 2011 with eval by K Clance GOLD 2 no change with spiriva so d/c'd just used ventolin sporadically but around summer 2020 moved to [facility] which is normally just summer vacation spot and there experenced cough/wheeze / sob rx more albuterol up to twice daily so self referrd back to pulmonary clinic 12/25/2019    History of Present Illness  12/25/2019  Pulmonary/ 1st office eval/Tanai Bouler / s/p 2nd [place]  Chief Complaint  Patient presents with  . Consult    pt states uses rescue inhaler everyday. pt states when doing activities sob  Dyspnea:  MMRC2 = can't walk a nl pace on a flat grade s sob but does fine slow and flat  Cough: white phelgm worse in am  Sleep: flat, sleeps two pillows  SABA use: twice daily  Also has omeprazole but not using ac rec Plan A = Automatic = Always=    Breztri Take 2 puffs first thing in am and then another 2 puffs about 12 hours later.  Work on inhaler technique: Plan B = Backup (to supplement plan A, not to replace it) Only use your albuterol inhaler as a rescue medication  Omeprazole Take 30-60 min before first meal of the day  Stop fosfamax until return Stop metaprolol and start Bisoprolol 5 mg one daily (won't interfere with albuterol)     02/13/2020  f/u ov/Dayshia Ballinas re:  Copd GOLD II/ RA  Had second pfizer 10/07/19  Chief Complaint  Patient presents with  . Follow-up  Dyspnea:  X 50 ft due to L knee and both lower legs ache  Cough: better noct then worse p an hour of stirring/ mucoid min vol   Sleeping: bed is horizontal, sleeps on side one pillow SABA use: breztri /saba  02: none    No obvious day to day or daytime variability or assoc excess/ purulent sputum or mucus plugs or hemoptysis or cp or chest tightness, subjective wheeze or overt sinus or hb symptoms.   93 without nocturnal  or early am exacerbation  of respiratory  c/o's or need  for noct saba. Also denies any obvious fluctuation of symptoms with weather or environmental changes or other aggravating or alleviating factors except as outlined above   No unusual exposure hx or h/o childhood pna/ asthma or knowledge of premature birth.  Current Allergies, Complete Past Medical History, Past Surgical History, Family History, and Social History were reviewed in [employer] record.  ROS  The following are not active complaints unless bolded Hoarseness, sore throat, dysphagia, dental problems, itching, sneezing,  nasal congestion or discharge of excess mucus or purulent secretions, ear ache,   fever, chills, sweats, unintended wt loss or wt gain, classically pleuritic or exertional cp,  orthopnea pnd or arm/hand swelling  or leg swelling, presyncope, palpitations, abdominal pain, anorexia, nausea, vomiting, diarrhea  or change in bowel habits or change in bladder habits, change in stools or change in urine, dysuria, hematuria,  rash, arthralgias, visual complaints, headache, numbness, weakness or ataxia or problems with walking or coordination,  change in mood or  memory.        Current Meds  Medication Sig  . albuterol (PROVENTIL HFA;VENTOLIN HFA) 108 (90 Base) MCG/ACT inhaler Inhale 2 puffs into the lungs every 4 (four) hours as needed for wheezing or shortness of breath.  . bisoprolol (ZEBETA)  5 MG tablet Take 1 tablet (5 mg total) by mouth daily.  . Budeson-Glycopyrrol-Formoterol (BREZTRI AEROSPHERE) 160-9-4.8 MCG/ACT AERO Inhale 2 puffs into the lungs in the morning and at bedtime.  . Cholecalciferol (VITAMIN D3 PO) Take 2,400 Units by mouth daily.  . Coenzyme Q10 (COQ-10) 100 MG CAPS Take 1 capsule by mouth daily.  . DULoxetine (CYMBALTA) 20 MG capsule Take 20 mg by mouth daily.  . Ginger, Zingiber officinalis, (GINGER ROOT PO) Take 1 capsule by mouth daily.  . hydroxychloroquine (PLAQUENIL) 200 MG tablet Take by mouth 2 (two) times daily.   Marland Kitchen  leflunomide (ARAVA) 20 MG tablet Take 20 mg by mouth daily.  . magnesium gluconate (MAGONATE) 500 MG tablet Take 500 mg by mouth daily.  . Multiple Vitamins-Minerals (MULTIVITAMIN WOMEN) TABS Take 1 tablet by mouth daily.  Marland Kitchen omeprazole (PRILOSEC) 20 MG capsule Take 20 mg by mouth 2 (two) times daily before a meal.   . triamterene-hydrochlorothiazide (DYAZIDE) 37.5-25 MG capsule Take 1 capsule by mouth daily.  . Turmeric Curcumin 500 MG CAPS Take 1,000 mg by mouth daily.  . vitamin B-12 (CYANOCOBALAMIN) 1000 MCG tablet Take 1,000 mcg by mouth daily.  . [DISCONTINUED] amLODipine (NORVASC) 5 MG tablet Take 5 mg by mouth daily.  . [DISCONTINUED] aspirin 81 MG tablet Take 81 mg by mouth daily.  . [DISCONTINUED] HYDROcodone-acetaminophen (NORCO/VICODIN) 5-325 MG tablet Take 1 tablet by mouth every 8 (eight) hours as needed.  . [DISCONTINUED] HYDROcodone-acetaminophen (NORCO/VICODIN) 5-325 MG tablet Take 1-2 tablets by mouth every 4 (four) hours as needed (pain).  . [DISCONTINUED] losartan (COZAAR) 50 MG tablet Take 50 mg by mouth daily before breakfast.                   Past Medical History:  Diagnosis Date  . Arthritis    lumbar, toes, L knee, cerv. spine & hands   . Cancer ([place])    basal cell removed fr. face   . COPD (chronic obstructive pulmonary disease) ([place])   . GERD (gastroesophageal reflux disease)   . H/O echocardiogram   . Hiatal hernia   . History of hiatal hernia   . Hypertension   . Hypertension    currently followed for BP / heart management with Dr. Leanora Ivanoff, but prev. saw Dr. Einar Gip    Outpatient Medications Prior to Visit  Medication Sig Dispense Refill  . albuterol (PROVENTIL HFA;VENTOLIN HFA) 108 (90 Base) MCG/ACT inhaler Inhale 2 puffs into the lungs every 4 (four) hours as needed for wheezing or shortness of breath.    Marland Kitchen aspirin 81 MG tablet Take 81 mg by mouth daily.    . Cholecalciferol (VITAMIN D3 PO) Take 2,400 Units by mouth daily.    . Coenzyme Q10  (COQ-10) 100 MG CAPS Take 1 capsule by mouth daily.    . DULoxetine (CYMBALTA) 20 MG capsule Take 20 mg by mouth daily.    . hydroxychloroquine (PLAQUENIL) 200 MG tablet Take by mouth daily.    Marland Kitchen losartan (COZAAR) 50 MG tablet Take 50 mg by mouth daily before breakfast.     . Multiple Vitamins-Minerals (MULTIVITAMIN WOMEN) TABS Take 1 tablet by mouth daily.    Marland Kitchen omeprazole (PRILOSEC) 20 MG capsule Take 20 mg by mouth daily before breakfast.     . vitamin B-12 (CYANOCOBALAMIN) 1000 MCG tablet Take 1,000 mcg by mouth daily.    Marland Kitchen alendronate (FOSAMAX) 70 MG tablet Take 70 mg by mouth once a week.    Marland Kitchen amLODipine (NORVASC) 5  MG tablet Take 5 mg by mouth daily.    . Ginger, Zingiber officinalis, (GINGER ROOT PO) Take 1 capsule by mouth daily.    Marland Kitchen HYDROcodone-acetaminophen (NORCO/VICODIN) 5-325 MG tablet Take 1 tablet by mouth every 8 (eight) hours as needed.  0  . HYDROcodone-acetaminophen (NORCO/VICODIN) 5-325 MG tablet Take 1-2 tablets by mouth every 4 (four) hours as needed (pain). (Patient not taking: Reported on 12/25/2019) 50 tablet 0  . leflunomide (ARAVA) 20 MG tablet Take 20 mg by mouth daily.    . magnesium gluconate (MAGONATE) 500 MG tablet Take 500 mg by mouth daily.    . metoprolol succinate (TOPROL-XL) 100 MG 24 hr tablet Take 100 mg by mouth daily after breakfast. Take with or immediately following a meal.    . Turmeric Curcumin 500 MG CAPS Take 1,000 mg by mouth daily.        Objective:    Wt Readings from Last 3 Encounters:  02/13/20 161 lb 3.2 oz (73.1 kg)  12/25/19 172 lb (78 kg)  10/01/15 180 lb 6.4 oz (81.8 kg)    amb wf with mild pseudowheeze   Vital signs reviewed - Note on arrival 02/13/2020   02 sats 93% on RA     HEENT : pt wearing mask not removed for exam due to covid - 19 concerns.   NECK :  without JVD/Nodes/TM/ nl carotid upstrokes bilaterally   LUNGS: no acc muscle use,  Min barrel  contour chest wall with bilateral   decreased distant pan exp wheeze and   without cough on insp or exp maneuvers and min  Hyperresonant  to  percussion bilaterally     CV:  RRR  no s3 or murmur or increase in P2, and no edema   ABD:  soft and nontender with pos end  insp Hoover's  in the supine position. No bruits or organomegaly appreciated, bowel sounds nl  MS:   Nl gait/  ext warm without deformities, calf tenderness, cyanosis or clubbing No obvious joint restrictions   SKIN: warm and dry without lesions    NEURO:  alert, approp, nl sensorium with  no motor or cerebellar deficits apparent.          I personally reviewed images and agree with radiology impression as follows:  CXR:    PA and Lat 12/25/19  1.  No radiographic evidence of acute cardiopulmonary disease. 2. Aortic atherosclerosis.   Labs ordered/ reviewed:      Chemistry      Component Value Date/Time   NA 135 02/13/2020 1221   K 3.4 (L) 02/13/2020 1221   CL 92 (L) 02/13/2020 1221   CO2 33 (H) 02/13/2020 1221   BUN 35 (H) 02/13/2020 1221   CREATININE 1.59 (H) 02/13/2020 1221      Component Value Date/Time   CALCIUM 11.0 (H) 02/13/2020 1221        Lab Results  Component Value Date   WBC 5.8 02/13/2020   HGB 15.9 (H) 02/13/2020   HCT 47.1 (H) 02/13/2020   MCV 98.3 02/13/2020   PLT 221.0 02/13/2020       EOS                                                              0.0  02/13/2020   Lab Results  Component Value Date   DDIMER 1.00 (H) 02/13/2020      Lab Results  Component Value Date   TSH 0.71 02/13/2020     Lab Results  Component Value Date   PROBNP 79.0 02/13/2020       Lab Results  Component Value Date   ESRSEDRATE 5 02/13/2020       Labs ordered 02/13/2020  :  allergy profile   alpha one AT phenotype      Assessment

## 2020-02-13 NOTE — Patient Instructions (Signed)
No change medications for now   Please remember to go to the lab department   for your tests - we will call you with the results when they are available.     Please schedule a follow up office visit in 6 weeks with pfts on return, call sooner if condition worsens

## 2020-02-14 ENCOUNTER — Encounter: Payer: Self-pay | Admitting: Internal Medicine

## 2020-02-14 NOTE — Assessment & Plan Note (Addendum)
Changed metaprolol to bisoprolol 5 mg daily 12/25/2019 due to wheeze  Although even in retrospect it may not be clear the toprol  contributed to the pt's symptoms,    addingit back at this point or in the future would risk confusion in interpretation of non-specific respiratory symptoms to which this patient is prone  ie  Better not to muddy the waters here. Adequate control on present rx, reviewed in detail with pt > no change in rx needed    Medical decision making was a moderate level of complexity in this case because of  two chronic conditions /diagnoses requiring extra time for  H and P, chart review, counseling,  directly observing portions of ambulatory 02 saturation study/teaching hfa technique using teachback method/ and generating customized AVS unique to this office visit and charting.   Each maintenance medication was reviewed in detail including emphasizing most importantly the difference between maintenance and prns and under what circumstances the prns are to be triggered using an action plan format where appropriate. Please see avs for details which were reviewed in writing by both me and my nurse and patient given a written copy highlighted where appropriate with yellow highlighter for the patient's continued care at home along with an updated version of their medications.  Patient was asked to maintain medication reconciliation by comparing this list to the actual medications being used at home and to contact this office right away if there is a conflict or discrepancy.

## 2020-02-14 NOTE — Assessment & Plan Note (Signed)
Quit smoking in 2011  PFT's 2012: FEV1 1.77 (74%), ratio 55, +airtrapping, TLC normal, DLCO 55% - 12/25/2019  After extensive coaching inhaler device,  effectiveness =    50% > breztri Take 2 puffs first thing in am and then another 2 puffs about 12 hours later.  - 02/13/2020  After extensive coaching inhaler device,  effectiveness =    90%    Group D in terms of symptom/risk and laba/lama/ICS  therefore appropriate rx at this point >>>  Breztri 2bid   I spent extra time with pt today reviewing appropriate use of albuterol for prn use on exertion with the following points: 1) saba is for relief of sob that does not improve by walking a slower pace or resting but rather if the pt does not improve after trying this first. 2) If the pt is convinced, as many are, that saba helps recover from activity faster then it's easy to tell if this is the case by re-challenging : ie stop, take the inhaler, then p 5 minutes try the exact same activity (intensity of workload) that just caused the symptoms and see if they are substantially diminished or not after saba 3) if there is an activity that reproducibly causes the symptoms, try the saba 15 min before the activity on alternate days   If in fact the saba really does help, then fine to continue to use it prn but advised may need to look closer at the maintenance regimen being used to achieve better control of airways disease with exertion.

## 2020-02-14 NOTE — Assessment & Plan Note (Addendum)
02/13/2020   Walked RA  2 laps @ approx 219ft each @ moderate pace  stopped due to end of study, mild sob  And sats 96%  pt does appear to have difficult to sort out respiratory symptoms of unknown origin for which  DDX  = almost all start with A and  include Adherence, Ace Inhibitors, Acid Reflux, Active Sinus Disease, Alpha 1 Antitripsin deficiency, Anxiety masquerading as Airways dz,  ABPA,  Allergy(esp in young), Aspiration (esp in elderly), Adverse effects of meds,  Active smoking or Vaping, A bunch of PE's/clot burden (a few small clots can't cause this syndrome unless there is already severe underlying pulm or vascular dz with poor reserve),  Anemia or thyroid disorder, plus two Bs  = Bronchiectasis and Beta blocker use..and one C= CHF  Adherence is always the initial "prime suspect" and is a multilayered concern that requires a "trust but verify" approach in every patient - starting with knowing how to use medications, especially inhalers, correctly, keeping up with refills and understanding the fundamental difference between maintenance and prns vs those medications only taken for a very short course and then stopped and not refilled.  - see hfa teaching - with all meds in hand using a trust but verify approach to confirm accurate Medication  Reconciliation The principal here is that until we are certain that the  patients are doing what we've asked, it makes no sense to ask them to do more.   ? Acid (or non-acid) GERD > always difficult to exclude as up to 75% of pts in some series report no assoc GI/ Heartburn symptoms> rec continue max (24h)  acid suppression and diet restrictions/ reviewed     ? Allergy/asthma > Eos 0,  Check allergy profile  ? Anemia/ thyroid dz > ruled out today  ? Alpha one at > send phenotype  ? Adverse drug effects> none of the usual suspects listed   ? D dimer nl - while  high normal value (seen here as commonly in the elderly or chronically ill)  may miss small  peripheral pe, the clot burden with sob is moderately high and the d dimer  has a very high neg pred value if used in this setting.     ? chf  > excluded by such low bnp    >>> rx copd/ check labs/ f/u 6 weeks with full pfts as also has RA with possibility here of RA lung dz

## 2020-02-16 ENCOUNTER — Telehealth: Payer: Self-pay | Admitting: Internal Medicine

## 2020-02-16 NOTE — Telephone Encounter (Signed)
Spoke with the pt and notified of lab results  I have faxed copy to her PCP as well as Dr Aryl  Nothing further needed  She will have repeat BMET at Dr. Pennie Banter office

## 2020-02-16 NOTE — Progress Notes (Signed)
Spoke with pt and notified of results per Dr. Melvyn Novas. Pt verbalized understanding and denied any questions. Copy to PCP and Dr Aryl per pt request

## 2020-02-19 DIAGNOSIS — Z79899 Other long term (current) drug therapy: Secondary | ICD-10-CM | POA: Diagnosis not present

## 2020-02-19 DIAGNOSIS — M79643 Pain in unspecified hand: Secondary | ICD-10-CM | POA: Diagnosis not present

## 2020-02-19 DIAGNOSIS — M1990 Unspecified osteoarthritis, unspecified site: Secondary | ICD-10-CM | POA: Diagnosis not present

## 2020-02-19 DIAGNOSIS — M25562 Pain in left knee: Secondary | ICD-10-CM | POA: Diagnosis not present

## 2020-02-19 DIAGNOSIS — M797 Fibromyalgia: Secondary | ICD-10-CM | POA: Diagnosis not present

## 2020-02-19 DIAGNOSIS — M0579 Rheumatoid arthritis with rheumatoid factor of multiple sites without organ or systems involvement: Secondary | ICD-10-CM | POA: Diagnosis not present

## 2020-02-20 DIAGNOSIS — M0579 Rheumatoid arthritis with rheumatoid factor of multiple sites without organ or systems involvement: Secondary | ICD-10-CM | POA: Diagnosis not present

## 2020-02-20 DIAGNOSIS — I10 Essential (primary) hypertension: Secondary | ICD-10-CM | POA: Diagnosis not present

## 2020-02-20 DIAGNOSIS — E7800 Pure hypercholesterolemia, unspecified: Secondary | ICD-10-CM | POA: Diagnosis not present

## 2020-02-21 LAB — D-DIMER, QUANTITATIVE: D-Dimer, Quant: 1 mcg/mL FEU — ABNORMAL HIGH (ref <0.50)

## 2020-02-21 LAB — ALPHA-1 ANTITRYPSIN PHENOTYPE: A-1 Antitrypsin, Ser: 185 mg/dL (ref 83–199)

## 2020-02-21 LAB — IGE: IgE (Immunoglobulin E), Serum: 5 kU/L (ref <=114)

## 2020-02-25 DIAGNOSIS — Z0001 Encounter for general adult medical examination with abnormal findings: Secondary | ICD-10-CM | POA: Diagnosis not present

## 2020-02-25 DIAGNOSIS — R351 Nocturia: Secondary | ICD-10-CM | POA: Diagnosis not present

## 2020-02-25 DIAGNOSIS — M810 Age-related osteoporosis without current pathological fracture: Secondary | ICD-10-CM | POA: Diagnosis not present

## 2020-02-25 DIAGNOSIS — E041 Nontoxic single thyroid nodule: Secondary | ICD-10-CM | POA: Diagnosis not present

## 2020-02-25 DIAGNOSIS — K219 Gastro-esophageal reflux disease without esophagitis: Secondary | ICD-10-CM | POA: Diagnosis not present

## 2020-02-25 DIAGNOSIS — M064 Inflammatory polyarthropathy: Secondary | ICD-10-CM | POA: Diagnosis not present

## 2020-02-25 DIAGNOSIS — E785 Hyperlipidemia, unspecified: Secondary | ICD-10-CM | POA: Diagnosis not present

## 2020-02-25 DIAGNOSIS — J45909 Unspecified asthma, uncomplicated: Secondary | ICD-10-CM | POA: Diagnosis not present

## 2020-02-25 DIAGNOSIS — I10 Essential (primary) hypertension: Secondary | ICD-10-CM | POA: Diagnosis not present

## 2020-02-25 DIAGNOSIS — N1832 Chronic kidney disease, stage 3b: Secondary | ICD-10-CM | POA: Diagnosis not present

## 2020-02-25 DIAGNOSIS — I700 Atherosclerosis of aorta: Secondary | ICD-10-CM | POA: Diagnosis not present

## 2020-02-25 DIAGNOSIS — M0579 Rheumatoid arthritis with rheumatoid factor of multiple sites without organ or systems involvement: Secondary | ICD-10-CM | POA: Diagnosis not present

## 2020-03-01 ENCOUNTER — Ambulatory Visit: Payer: Medicare Other

## 2020-03-01 ENCOUNTER — Other Ambulatory Visit: Payer: Self-pay

## 2020-03-01 DIAGNOSIS — I6521 Occlusion and stenosis of right carotid artery: Secondary | ICD-10-CM

## 2020-03-01 DIAGNOSIS — R0989 Other specified symptoms and signs involving the circulatory and respiratory systems: Secondary | ICD-10-CM

## 2020-03-04 DIAGNOSIS — N1831 Chronic kidney disease, stage 3a: Secondary | ICD-10-CM | POA: Diagnosis not present

## 2020-03-04 DIAGNOSIS — N2889 Other specified disorders of kidney and ureter: Secondary | ICD-10-CM | POA: Diagnosis not present

## 2020-03-10 DIAGNOSIS — E785 Hyperlipidemia, unspecified: Secondary | ICD-10-CM | POA: Diagnosis not present

## 2020-03-10 DIAGNOSIS — F330 Major depressive disorder, recurrent, mild: Secondary | ICD-10-CM | POA: Diagnosis not present

## 2020-03-10 DIAGNOSIS — N1832 Chronic kidney disease, stage 3b: Secondary | ICD-10-CM | POA: Diagnosis not present

## 2020-03-10 DIAGNOSIS — T148XXA Other injury of unspecified body region, initial encounter: Secondary | ICD-10-CM | POA: Diagnosis not present

## 2020-03-10 DIAGNOSIS — I6523 Occlusion and stenosis of bilateral carotid arteries: Secondary | ICD-10-CM | POA: Diagnosis not present

## 2020-03-10 DIAGNOSIS — M810 Age-related osteoporosis without current pathological fracture: Secondary | ICD-10-CM | POA: Diagnosis not present

## 2020-03-10 DIAGNOSIS — R238 Other skin changes: Secondary | ICD-10-CM | POA: Diagnosis not present

## 2020-03-10 DIAGNOSIS — M0579 Rheumatoid arthritis with rheumatoid factor of multiple sites without organ or systems involvement: Secondary | ICD-10-CM | POA: Diagnosis not present

## 2020-03-10 DIAGNOSIS — M8589 Other specified disorders of bone density and structure, multiple sites: Secondary | ICD-10-CM | POA: Diagnosis not present

## 2020-03-10 DIAGNOSIS — I10 Essential (primary) hypertension: Secondary | ICD-10-CM | POA: Diagnosis not present

## 2020-03-12 DIAGNOSIS — I10 Essential (primary) hypertension: Secondary | ICD-10-CM | POA: Diagnosis not present

## 2020-03-17 DIAGNOSIS — N1832 Chronic kidney disease, stage 3b: Secondary | ICD-10-CM | POA: Diagnosis not present

## 2020-03-17 DIAGNOSIS — M0579 Rheumatoid arthritis with rheumatoid factor of multiple sites without organ or systems involvement: Secondary | ICD-10-CM | POA: Diagnosis not present

## 2020-03-17 DIAGNOSIS — R768 Other specified abnormal immunological findings in serum: Secondary | ICD-10-CM | POA: Diagnosis not present

## 2020-03-17 DIAGNOSIS — Z79899 Other long term (current) drug therapy: Secondary | ICD-10-CM | POA: Diagnosis not present

## 2020-03-17 DIAGNOSIS — M1990 Unspecified osteoarthritis, unspecified site: Secondary | ICD-10-CM | POA: Diagnosis not present

## 2020-03-17 DIAGNOSIS — M79643 Pain in unspecified hand: Secondary | ICD-10-CM | POA: Diagnosis not present

## 2020-03-17 DIAGNOSIS — M797 Fibromyalgia: Secondary | ICD-10-CM | POA: Diagnosis not present

## 2020-03-17 DIAGNOSIS — N289 Disorder of kidney and ureter, unspecified: Secondary | ICD-10-CM | POA: Diagnosis not present

## 2020-03-25 DIAGNOSIS — N1832 Chronic kidney disease, stage 3b: Secondary | ICD-10-CM | POA: Diagnosis not present

## 2020-03-25 DIAGNOSIS — I10 Essential (primary) hypertension: Secondary | ICD-10-CM | POA: Diagnosis not present

## 2020-03-25 DIAGNOSIS — M0579 Rheumatoid arthritis with rheumatoid factor of multiple sites without organ or systems involvement: Secondary | ICD-10-CM | POA: Diagnosis not present

## 2020-03-25 DIAGNOSIS — K219 Gastro-esophageal reflux disease without esophagitis: Secondary | ICD-10-CM | POA: Diagnosis not present

## 2020-03-29 ENCOUNTER — Ambulatory Visit: Payer: BLUE CROSS/BLUE SHIELD | Admitting: Cardiology

## 2020-03-30 DIAGNOSIS — I129 Hypertensive chronic kidney disease with stage 1 through stage 4 chronic kidney disease, or unspecified chronic kidney disease: Secondary | ICD-10-CM | POA: Diagnosis not present

## 2020-03-30 DIAGNOSIS — M069 Rheumatoid arthritis, unspecified: Secondary | ICD-10-CM | POA: Diagnosis not present

## 2020-03-30 DIAGNOSIS — N1832 Chronic kidney disease, stage 3b: Secondary | ICD-10-CM | POA: Diagnosis not present

## 2020-03-30 DIAGNOSIS — K589 Irritable bowel syndrome without diarrhea: Secondary | ICD-10-CM | POA: Diagnosis not present

## 2020-04-01 ENCOUNTER — Other Ambulatory Visit: Payer: Self-pay | Admitting: Nephrology

## 2020-04-01 DIAGNOSIS — N1832 Chronic kidney disease, stage 3b: Secondary | ICD-10-CM

## 2020-04-02 ENCOUNTER — Ambulatory Visit: Payer: Medicare Other | Admitting: Internal Medicine

## 2020-04-08 ENCOUNTER — Ambulatory Visit
Admission: RE | Admit: 2020-04-08 | Discharge: 2020-04-08 | Disposition: A | Discharge status: Home / Self Care | Payer: Medicare Other | Source: Ambulatory Visit | Attending: Nephrology | Admitting: Nephrology

## 2020-04-08 DIAGNOSIS — Z1231 Encounter for screening mammogram for malignant neoplasm of breast: Secondary | ICD-10-CM | POA: Diagnosis not present

## 2020-04-08 DIAGNOSIS — N183 Chronic kidney disease, stage 3 unspecified: Secondary | ICD-10-CM | POA: Diagnosis not present

## 2020-04-08 DIAGNOSIS — N1832 Chronic kidney disease, stage 3b: Secondary | ICD-10-CM

## 2020-04-21 DIAGNOSIS — Z23 Encounter for immunization: Secondary | ICD-10-CM | POA: Diagnosis not present

## 2020-04-28 DIAGNOSIS — N289 Disorder of kidney and ureter, unspecified: Secondary | ICD-10-CM | POA: Diagnosis not present

## 2020-04-28 DIAGNOSIS — M79643 Pain in unspecified hand: Secondary | ICD-10-CM | POA: Diagnosis not present

## 2020-04-28 DIAGNOSIS — M0579 Rheumatoid arthritis with rheumatoid factor of multiple sites without organ or systems involvement: Secondary | ICD-10-CM | POA: Diagnosis not present

## 2020-04-28 DIAGNOSIS — N1831 Chronic kidney disease, stage 3a: Secondary | ICD-10-CM | POA: Diagnosis not present

## 2020-04-28 DIAGNOSIS — M25562 Pain in left knee: Secondary | ICD-10-CM | POA: Diagnosis not present

## 2020-04-28 DIAGNOSIS — M1990 Unspecified osteoarthritis, unspecified site: Secondary | ICD-10-CM | POA: Diagnosis not present

## 2020-04-28 DIAGNOSIS — R768 Other specified abnormal immunological findings in serum: Secondary | ICD-10-CM | POA: Diagnosis not present

## 2020-04-28 DIAGNOSIS — M797 Fibromyalgia: Secondary | ICD-10-CM | POA: Diagnosis not present

## 2020-04-28 DIAGNOSIS — M7989 Other specified soft tissue disorders: Secondary | ICD-10-CM | POA: Diagnosis not present

## 2020-04-28 DIAGNOSIS — Z79899 Other long term (current) drug therapy: Secondary | ICD-10-CM | POA: Diagnosis not present

## 2020-05-06 DIAGNOSIS — M0579 Rheumatoid arthritis with rheumatoid factor of multiple sites without organ or systems involvement: Secondary | ICD-10-CM | POA: Diagnosis not present

## 2020-05-27 DIAGNOSIS — M0579 Rheumatoid arthritis with rheumatoid factor of multiple sites without organ or systems involvement: Secondary | ICD-10-CM | POA: Diagnosis not present

## 2020-06-10 DIAGNOSIS — M0579 Rheumatoid arthritis with rheumatoid factor of multiple sites without organ or systems involvement: Secondary | ICD-10-CM | POA: Diagnosis not present

## 2020-06-28 DIAGNOSIS — M79643 Pain in unspecified hand: Secondary | ICD-10-CM | POA: Diagnosis not present

## 2020-06-28 DIAGNOSIS — M1990 Unspecified osteoarthritis, unspecified site: Secondary | ICD-10-CM | POA: Diagnosis not present

## 2020-06-28 DIAGNOSIS — M0579 Rheumatoid arthritis with rheumatoid factor of multiple sites without organ or systems involvement: Secondary | ICD-10-CM | POA: Diagnosis not present

## 2020-06-28 DIAGNOSIS — E673 Hypervitaminosis D: Secondary | ICD-10-CM | POA: Diagnosis not present

## 2020-06-28 DIAGNOSIS — R768 Other specified abnormal immunological findings in serum: Secondary | ICD-10-CM | POA: Diagnosis not present

## 2020-06-28 DIAGNOSIS — M797 Fibromyalgia: Secondary | ICD-10-CM | POA: Diagnosis not present

## 2020-06-28 DIAGNOSIS — M069 Rheumatoid arthritis, unspecified: Secondary | ICD-10-CM | POA: Diagnosis not present

## 2020-06-28 DIAGNOSIS — N1831 Chronic kidney disease, stage 3a: Secondary | ICD-10-CM | POA: Diagnosis not present

## 2020-06-28 DIAGNOSIS — Z79899 Other long term (current) drug therapy: Secondary | ICD-10-CM | POA: Diagnosis not present

## 2020-06-28 DIAGNOSIS — N1832 Chronic kidney disease, stage 3b: Secondary | ICD-10-CM | POA: Diagnosis not present

## 2020-06-28 DIAGNOSIS — M25562 Pain in left knee: Secondary | ICD-10-CM | POA: Diagnosis not present

## 2020-06-28 DIAGNOSIS — I129 Hypertensive chronic kidney disease with stage 1 through stage 4 chronic kidney disease, or unspecified chronic kidney disease: Secondary | ICD-10-CM | POA: Diagnosis not present

## 2020-07-08 DIAGNOSIS — M0579 Rheumatoid arthritis with rheumatoid factor of multiple sites without organ or systems involvement: Secondary | ICD-10-CM | POA: Diagnosis not present

## 2020-08-05 DIAGNOSIS — M0579 Rheumatoid arthritis with rheumatoid factor of multiple sites without organ or systems involvement: Secondary | ICD-10-CM | POA: Diagnosis not present

## 2020-08-11 DIAGNOSIS — J4521 Mild intermittent asthma with (acute) exacerbation: Secondary | ICD-10-CM | POA: Diagnosis not present

## 2020-08-11 DIAGNOSIS — Z1152 Encounter for screening for COVID-19: Secondary | ICD-10-CM | POA: Diagnosis not present

## 2020-08-11 DIAGNOSIS — F321 Major depressive disorder, single episode, moderate: Secondary | ICD-10-CM | POA: Diagnosis not present

## 2020-09-02 DIAGNOSIS — M0579 Rheumatoid arthritis with rheumatoid factor of multiple sites without organ or systems involvement: Secondary | ICD-10-CM | POA: Diagnosis not present

## 2020-09-14 DIAGNOSIS — L720 Epidermal cyst: Secondary | ICD-10-CM | POA: Diagnosis not present

## 2020-09-14 DIAGNOSIS — C44529 Squamous cell carcinoma of skin of other part of trunk: Secondary | ICD-10-CM | POA: Diagnosis not present

## 2020-09-14 DIAGNOSIS — D0461 Carcinoma in situ of skin of right upper limb, including shoulder: Secondary | ICD-10-CM | POA: Diagnosis not present

## 2020-09-20 DIAGNOSIS — Z20822 Contact with and (suspected) exposure to covid-19: Secondary | ICD-10-CM | POA: Diagnosis not present

## 2020-09-20 DIAGNOSIS — Z03818 Encounter for observation for suspected exposure to other biological agents ruled out: Secondary | ICD-10-CM | POA: Diagnosis not present

## 2020-09-23 ENCOUNTER — Emergency Department (HOSPITAL_BASED_OUTPATIENT_CLINIC_OR_DEPARTMENT_OTHER): Payer: Medicare Other

## 2020-09-23 ENCOUNTER — Other Ambulatory Visit: Payer: Self-pay

## 2020-09-23 ENCOUNTER — Emergency Department (HOSPITAL_BASED_OUTPATIENT_CLINIC_OR_DEPARTMENT_OTHER)
Admission: EM | Admit: 2020-09-23 | Discharge: 2020-09-23 | Disposition: A | Discharge status: Home / Self Care | Payer: Medicare Other | Attending: Emergency Medicine | Admitting: Emergency Medicine

## 2020-09-23 ENCOUNTER — Encounter (HOSPITAL_BASED_OUTPATIENT_CLINIC_OR_DEPARTMENT_OTHER): Payer: Self-pay | Admitting: Emergency Medicine

## 2020-09-23 DIAGNOSIS — U071 COVID-19: Secondary | ICD-10-CM | POA: Diagnosis not present

## 2020-09-23 DIAGNOSIS — Z7951 Long term (current) use of inhaled steroids: Secondary | ICD-10-CM | POA: Diagnosis not present

## 2020-09-23 DIAGNOSIS — J449 Chronic obstructive pulmonary disease, unspecified: Secondary | ICD-10-CM | POA: Insufficient documentation

## 2020-09-23 DIAGNOSIS — Z79899 Other long term (current) drug therapy: Secondary | ICD-10-CM | POA: Diagnosis not present

## 2020-09-23 DIAGNOSIS — R0602 Shortness of breath: Secondary | ICD-10-CM | POA: Diagnosis not present

## 2020-09-23 DIAGNOSIS — Z85828 Personal history of other malignant neoplasm of skin: Secondary | ICD-10-CM | POA: Insufficient documentation

## 2020-09-23 DIAGNOSIS — I10 Essential (primary) hypertension: Secondary | ICD-10-CM | POA: Diagnosis not present

## 2020-09-23 DIAGNOSIS — R059 Cough, unspecified: Secondary | ICD-10-CM | POA: Diagnosis not present

## 2020-09-23 DIAGNOSIS — Z87891 Personal history of nicotine dependence: Secondary | ICD-10-CM | POA: Insufficient documentation

## 2020-09-23 DIAGNOSIS — Z96641 Presence of right artificial hip joint: Secondary | ICD-10-CM | POA: Insufficient documentation

## 2020-09-23 DIAGNOSIS — R519 Headache, unspecified: Secondary | ICD-10-CM | POA: Diagnosis not present

## 2020-09-23 LAB — CBC WITH DIFFERENTIAL/PLATELET
Abs Immature Granulocytes: 0.01 10*3/uL (ref 0.00–0.07)
Basophils Absolute: 0 10*3/uL (ref 0.0–0.1)
Basophils Relative: 1 %
Eosinophils Absolute: 0.1 10*3/uL (ref 0.0–0.5)
Eosinophils Relative: 1 %
HCT: 38.6 % (ref 36.0–46.0)
Hemoglobin: 13.2 g/dL (ref 12.0–15.0)
Immature Granulocytes: 0 %
Lymphocytes Relative: 17 %
Lymphs Abs: 1 10*3/uL (ref 0.7–4.0)
MCH: 31.5 pg (ref 26.0–34.0)
MCHC: 34.2 g/dL (ref 30.0–36.0)
MCV: 92.1 fL (ref 80.0–100.0)
Monocytes Absolute: 0.6 10*3/uL (ref 0.1–1.0)
Monocytes Relative: 11 %
Neutro Abs: 4.1 10*3/uL (ref 1.7–7.7)
Neutrophils Relative %: 70 %
Platelets: 169 10*3/uL (ref 150–400)
RBC: 4.19 MIL/uL (ref 3.87–5.11)
RDW: 12.6 % (ref 11.5–15.5)
WBC: 5.8 10*3/uL (ref 4.0–10.5)
nRBC: 0 % (ref 0.0–0.2)

## 2020-09-23 LAB — COMPREHENSIVE METABOLIC PANEL
ALT: 28 U/L (ref 0–44)
AST: 35 U/L (ref 15–41)
Albumin: 3.8 g/dL (ref 3.5–5.0)
Alkaline Phosphatase: 47 U/L (ref 38–126)
Anion gap: 14 (ref 5–15)
BUN: 24 mg/dL — ABNORMAL HIGH (ref 8–23)
CO2: 25 mmol/L (ref 22–32)
Calcium: 9 mg/dL (ref 8.9–10.3)
Chloride: 97 mmol/L — ABNORMAL LOW (ref 98–111)
Creatinine, Ser: 1.43 mg/dL — ABNORMAL HIGH (ref 0.44–1.00)
GFR, Estimated: 38 mL/min — ABNORMAL LOW (ref >60)
Glucose, Bld: 113 mg/dL — ABNORMAL HIGH (ref 70–99)
Potassium: 3.1 mmol/L — ABNORMAL LOW (ref 3.5–5.1)
Sodium: 136 mmol/L (ref 135–145)
Total Bilirubin: 0.4 mg/dL (ref 0.3–1.2)
Total Protein: 7.2 g/dL (ref 6.5–8.1)

## 2020-09-23 LAB — RESP PANEL BY RT-PCR (FLU A&B, COVID) ARPGX2
Influenza A by PCR: NEGATIVE
Influenza B by PCR: NEGATIVE
SARS Coronavirus 2 by RT PCR: POSITIVE — AB

## 2020-09-23 MED ORDER — IPRATROPIUM BROMIDE HFA 17 MCG/ACT IN AERS
2.0000 | INHALATION_SPRAY | Freq: Once | RESPIRATORY_TRACT | Status: AC
  Administered 2020-09-23: 2 via RESPIRATORY_TRACT
  Filled 2020-09-23: qty 12.9

## 2020-09-23 MED ORDER — POTASSIUM CHLORIDE CRYS ER 20 MEQ PO TBCR: 20.0000 meq | EXTENDED_RELEASE_TABLET | Freq: Every day | ORAL | 0 refills | Status: DC

## 2020-09-23 MED ORDER — PREDNISONE 50 MG PO TABS
60.0000 mg | ORAL_TABLET | Freq: Once | ORAL | Status: AC
  Administered 2020-09-23: 60 mg via ORAL
  Filled 2020-09-23: qty 1

## 2020-09-23 MED ORDER — ALBUTEROL SULFATE HFA 108 (90 BASE) MCG/ACT IN AERS
4.0000 | INHALATION_SPRAY | Freq: Once | RESPIRATORY_TRACT | Status: AC
  Administered 2020-09-23: 4 via RESPIRATORY_TRACT
  Filled 2020-09-23: qty 6.7

## 2020-09-23 MED ORDER — PREDNISONE 20 MG PO TABS: 40.0000 mg | ORAL_TABLET | Freq: Every day | ORAL | 0 refills | Status: AC

## 2020-09-23 MED ORDER — POTASSIUM CHLORIDE CRYS ER 20 MEQ PO TBCR
40.0000 meq | EXTENDED_RELEASE_TABLET | Freq: Once | ORAL | Status: AC
  Administered 2020-09-23: 40 meq via ORAL
  Filled 2020-09-23: qty 2

## 2020-09-23 NOTE — ED Triage Notes (Signed)
Reports shortness of breath x 2 weeks, cough , Hx COPD. Husband tested covid positive 2 weeks ago. Pt tested negative .

## 2020-09-23 NOTE — ED Provider Notes (Signed)
[facility] Provider Note   CSN: [id number] Arrival date & time: 09/23/20  1215     History Chief Complaint  Patient presents with  . Shortness of Breath    Donna [name] is a 75 y.o. female with past medical history of COPD, GERD, hypertension, dyspnea on exertion that presents the emerge department today for shortness of breath for two weeks after being exposed to COVID-19 by her husband.  Patient states that she has had shortness of breath for the past 2 weeks, with cough.  Cough and shortness of breath worsening today.  Denies any presyncopal episodes, dizziness, chest pain.  Denies any fevers or chills.  Patient does have a history of COPD, has been taking her inhalers daily as directed.  Is not on any oxygen at home.  Was vaccinated against COVID, was not boosted.  States that husband was recently diagnosed with Covid a week ago.  States that cough is slightly productive, clear sputum.  No hemoptysis.  No nausea or vomiting.  No myalgias, patient does have slight headache.  No fevers.  No neck pain or back pain.  States that she did have abdominal pain in the beginning of the illness, has resolved.  States that she also had diarrhea in the beginning of the illness, also has resolved.  No blood in her stools.  Is urinating normally.  Eating normally.  No sore throat.  No other complaints at this time.  HPI     Past Medical History:  Diagnosis Date  . Arthritis    lumbar, toes, L knee, cerv. spine & hands   . Cancer ([place])    basal cell removed fr. face   . COPD (chronic obstructive pulmonary disease) ([place])   . GERD (gastroesophageal reflux disease)   . H/O echocardiogram   . Hiatal hernia   . History of hiatal hernia   . Hypertension   . Hypertension    currently followed for BP / heart management with Dr. Leanora Ivanoff, but prev. saw Dr. Einar Gip    Patient Active Problem List   Diagnosis Date Noted  . Essential hypertension 12/25/2019  . 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 COPD GOLD 2  01/20/2011  . DOE (dyspnea on exertion) 12/29/2010    Past Surgical History:  Procedure Laterality Date  . BACK SURGERY  1994   cerv. spine fusion   . BREAST ENHANCEMENT SURGERY    . BREAST SURGERY  1980's   implants- bilateral   . CERVICAL SPINE SURGERY  1992   fusion  . JOINT REPLACEMENT Right 2007  . LUMBAR LAMINECTOMY/DECOMPRESSION MICRODISCECTOMY Left 10/07/2015   Procedure: Lumbar three-five Decompressive lumbar Laminectomy & Left Lumbar four-five Microdiscectomy;  Surgeon: Jovita Gamma, MD;  Location: [facility];  Service: Neurosurgery;  Laterality: Left;  . TONSILLECTOMY    . TOTAL HIP ARTHROPLASTY  2007   Right  . TUBAL LIGATION       OB History    Gravida  0   Para  0   Term  0   Preterm  0   AB  0   Living        SAB  0   IAB  0   Ectopic  0   Multiple      Live Births              Family History  Problem Relation Age of Onset  . Heart disease Maternal Aunt   . Heart disease  Maternal Aunt   . Lung cancer Sister   . Kidney cancer Mother   . Kidney cancer Maternal Uncle   . Kidney cancer Maternal Aunt     Social History   Tobacco Use  . Smoking status: Former Smoker    Packs/day: 0.70    Years: 24.00    Pack years: 16.80    Types: Cigarettes    Quit date: 07/31/2009    Years since quitting: 11.1  . Smokeless tobacco: Former Systems developer    Quit date: 10/01/1998  Substance Use Topics  . Alcohol use: Yes    Comment: wine- almost daily  . Drug use: No    Home Medications Prior to Admission medications   Medication Sig Start Date End Date Taking? Authorizing Provider  potassium chloride SA (KLOR-CON) 20 MEQ tablet Take 1 tablet (20 mEq total) by mouth daily for 7 days. 09/23/20 09/30/20 Yes Lakasha Mcfall, PA-C  predniSONE (DELTASONE) 20 MG tablet Take 2 tablets (40 mg total) by mouth daily for 5 days. 09/23/20 09/28/20 Yes Daneesha Quinteros, Deberah Pelton, PA-C  albuterol (PROVENTIL HFA;VENTOLIN  HFA) 108 (90 Base) MCG/ACT inhaler Inhale 2 puffs into the lungs every 4 (four) hours as needed for wheezing or shortness of breath.    [provider]  bisoprolol (ZEBETA) 5 MG tablet Take 1 tablet (5 mg total) by mouth daily. 12/25/19   Tanda Rockers, MD  Budeson-Glycopyrrol-Formoterol (BREZTRI AEROSPHERE) 160-9-4.8 MCG/ACT AERO Inhale 2 puffs into the lungs in the morning and at bedtime. 01/08/20   Tanda Rockers, MD  Budeson-Glycopyrrol-Formoterol (BREZTRI AEROSPHERE) 160-9-4.8 MCG/ACT AERO Inhale 2 puffs into the lungs in the morning and at bedtime. 02/13/20   Tanda Rockers, MD  Cholecalciferol (VITAMIN D3 PO) Take 2,400 Units by mouth daily.    [provider]  Coenzyme Q10 (COQ-10) 100 MG CAPS Take 1 capsule by mouth daily.    [provider]  DULoxetine (CYMBALTA) 20 MG capsule Take 20 mg by mouth daily.    [provider]  Ginger, Zingiber officinalis, (GINGER ROOT PO) Take 1 capsule by mouth daily.    [provider]  hydroxychloroquine (PLAQUENIL) 200 MG tablet Take by mouth 2 (two) times daily.     [provider]  leflunomide (ARAVA) 20 MG tablet Take 20 mg by mouth daily. 11/28/19   [provider]  magnesium gluconate (MAGONATE) 500 MG tablet Take 500 mg by mouth daily.    [provider]  Multiple Vitamins-Minerals (MULTIVITAMIN WOMEN) TABS Take 1 tablet by mouth daily.    [provider]  omeprazole (PRILOSEC) 20 MG capsule Take 20 mg by mouth 2 (two) times daily before a meal.     [provider]  triamterene-hydrochlorothiazide (DYAZIDE) 37.5-25 MG capsule Take 1 capsule by mouth daily.    [provider]  Turmeric Curcumin 500 MG CAPS Take 1,000 mg by mouth daily.    [provider]  vitamin B-12 (CYANOCOBALAMIN) 1000 MCG tablet Take 1,000 mcg by mouth daily.    [provider]    Allergies    Thorazine [chlorpromazine]  Review of Systems   Review of Systems   Constitutional: Negative for chills, diaphoresis, fatigue and fever.  HENT: Negative for congestion, sore throat and trouble swallowing.   Eyes: Negative for pain and visual disturbance.  Respiratory: Positive for shortness of breath. Negative for cough and wheezing.   Cardiovascular: Negative for chest pain, palpitations and leg swelling.  Gastrointestinal: Negative for abdominal distention, abdominal pain, diarrhea, nausea and vomiting.  Genitourinary: Negative for difficulty urinating.  Musculoskeletal: Negative for back pain, neck pain and neck stiffness.  Skin: Negative for pallor.  Neurological: Negative for dizziness, speech difficulty, weakness and headaches.  Psychiatric/Behavioral: Negative for confusion.    Physical Exam Updated Vital Signs BP 139/72   Pulse 77   Temp 98.7 F (37.1 C) (Oral)   Resp 20   SpO2 91%   Physical Exam Constitutional:      General: She is not in acute distress.    Appearance: Normal appearance. She is not ill-appearing, toxic-appearing or diaphoretic.     Comments: No respiratory distress, patient speaking to me in full sentences.  No accessory muscle use.  HENT:     Mouth/Throat:     Mouth: Mucous membranes are moist.     Pharynx: Oropharynx is clear.  Eyes:     General: No scleral icterus.    Extraocular Movements: Extraocular movements intact.     Pupils: Pupils are equal, round, and reactive to light.  Cardiovascular:     Rate and Rhythm: Normal rate and regular rhythm.     Pulses: Normal pulses.     Heart sounds: Normal heart sounds.  Pulmonary:     Effort: Pulmonary effort is normal. No respiratory distress.     Breath sounds: No stridor. Wheezing present. No rhonchi or rales.  Chest:     Chest wall: No tenderness.  Abdominal:     General: Abdomen is flat. There is no distension.     Palpations: Abdomen is soft.     Tenderness: There is no abdominal tenderness. There is no guarding or rebound.  Musculoskeletal:         General: No swelling or tenderness. Normal range of motion.     Cervical back: Normal range of motion and neck supple. No rigidity.     Right lower leg: No edema.     Left lower leg: No edema.  Skin:    General: Skin is warm and dry.     Capillary Refill: Capillary refill takes less than 2 seconds.     Coloration: Skin is not pale.  Neurological:     General: No focal deficit present.     Mental Status: She is alert and oriented to person, place, and time.  Psychiatric:        Mood and Affect: Mood normal.        Behavior: Behavior normal.     ED Results / Procedures / Treatments   Labs (all labs ordered are listed, but only abnormal results are displayed) Labs Reviewed  RESP PANEL BY RT-PCR (FLU A&B, COVID) ARPGX2 - Abnormal; Notable for the following components:      Result Value   SARS Coronavirus 2 by RT PCR POSITIVE (*)    All other components within normal limits  COMPREHENSIVE METABOLIC PANEL - Abnormal; Notable for the following components:   Potassium 3.1 (*)    Chloride 97 (*)    Glucose, Bld 113 (*)    BUN 24 (*)    Creatinine, Ser 1.43 (*)    GFR, Estimated 38 (*)    All other components within normal limits  CBC WITH DIFFERENTIAL/PLATELET    EKG EKG Interpretation  Date/Time:  Thursday September 23 2020 12:29:13 EST Ventricular Rate:  76 PR Interval:    QRS Duration: 86 QT Interval:  397 QTC Calculation: 447 R Axis:   93 Text Interpretation: Sinus rhythm Borderline T abnormalities, lateral leads Baseline wander in lead(s) V3 No STEMI Confirmed by  Octaviano Glow ([id number]) on 09/23/2020 12:32:43 PM   Radiology DG Chest Port 1 View  Result Date: 09/23/2020 CLINICAL DATA:  Shortness of breath, cough, congestion and headache x1 week EXAM: PORTABLE CHEST 1 VIEW COMPARISON:  Chest radiograph Dec 25, 2019 FINDINGS: The heart size and mediastinal contours are within normal limits. Aortic atherosclerosis. Both lungs are clear. Thoracic spondylosis. Degenerative  change of the bilateral AC joints. IMPRESSION: No active disease. Electronically Signed   By: Dahlia Bailiff MD   On: 09/23/2020 13:36    Procedures Procedures   Medications Ordered in ED Medications  albuterol (VENTOLIN HFA) 108 (90 Base) MCG/ACT inhaler 4 puff (4 puffs Inhalation Given 09/23/20 1310)  ipratropium (ATROVENT HFA) inhaler 2 puff (2 puffs Inhalation Given 09/23/20 1310)  predniSONE (DELTASONE) tablet 60 mg (60 mg Oral Given 09/23/20 1339)  potassium chloride SA (KLOR-CON) CR tablet 40 mEq (40 mEq Oral Given 09/23/20 1340)    ED Course  I have reviewed the triage vital signs and the nursing notes.  Pertinent labs & imaging results that were available during my care of the patient were reviewed by me and considered in my medical decision making (see chart for details).  Clinical Course as of 09/23/20 1400  Thu Sep 23, 2020  1311 75 yo female w/ COPD not on home O2, follows at [facility], here with myalgia, headache, fatigue, cough, and dypsnea for 2 weeks.  She and her husband got tested yesterday for Covid - one tested positive, the other negative.  She feels the samples were switched and she is actually positive.  She has had 2 vaccine doses without booster.  On exam she is 96% on room air, speaking and breathing comfortably, wheezing expiratory bilaterally.  ECG is nonischemic.  Pending CMP to evaluate hydration status, and steroids/puffs, and xray.  She is on day 12-14 of symptoms and would not benefit from MAB therapy or infusions.  If her workup is unremarkable she can be discharged home on a few days of prednisone as well. [MT]    Clinical Course User Index [MT] Wyvonnia Dusky, MD   MDM Rules/Calculators/A&P                          JAMILLIA CLOSSON is a 74 y.o. female with past medical history of COPD, GERD, hypertension, dyspnea on exertion that presents the emerge department today for shortness of breath for two weeks after being exposed to COVID-19 by her husband.   Patient appears well, satting at 97% on room air.  On ambulation patient remained above 97% on room air.  Will obtain basic work-up and chest x-ray at this time, do not suspect that patient will have to come in, appears well, no respiratory distress.  Does have some mild wheezing on exam, will give albuterol and Atrovent at this time, unable to give DuoNeb since patient is suspected Covid.  Work-up today with CMP  derangements of potassium of 3.1.  Creatinine 1.43, appears baseline.  CBC with no abnormalities, no leukocytosis.  Covid positive.  Will repeat potassium and give steroids here today.  Chest x-ray shows no acute cardiopulmonary disease. EKG interpreted me with no acute ischemia.  Upon reevaluation patient states that she feels much better, shortness of breath mostly resolved, O2 sat 96%.  No tachypnea, no tachycardia, patient appears well. Patient has on day 14 of Covid, is out of timeframe for lab therapies or infusions.  Will discharge patient home on prednisone  and have patient follow-up with PCP.  Patient does have a pulmonologist she can also follow-up with.did discuss that patient will need to have potassium rechecked in the next couple of days, did also discuss that I am giving her potassium while she is taking HCTZ with potential side effects of hyperkalemia, therefore patient to follow-up with PCP in regards to checking potassium levels. I doubt need for further emergent work up at this time. I explained the diagnosis and have given explicit precautions to return to the ER including for any other new or worsening symptoms. The patient understands and accepts the medical plan as it's been dictated and I have answered their questions. Discharge instructions concerning home care and prescriptions have been given. The patient is STABLE and is discharged to home in good condition.  I discussed this case with my attending physician who cosigned this note including patient's presenting symptoms,  physical exam, and planned diagnostics and interventions. Attending physician stated agreement with plan or made changes to plan which were implemented. Dr. Oneta Rack evaluated pt at bedside.   Final Clinical Impression(s) / ED Diagnoses Final diagnoses:  COVID    Rx / DC Orders ED Discharge Orders         Ordered    potassium chloride SA (KLOR-CON) 20 MEQ tablet  Daily        09/23/20 1333    predniSONE (DELTASONE) 20 MG tablet  Daily        09/23/20 1355           Alfredia Client, PA-C 09/23/20 1401    Wyvonnia Dusky, MD 09/23/20 1546

## 2020-09-23 NOTE — Discharge Instructions (Signed)
Your work-up today did show you are positive for COVID-19, please use the attached instructions and isolate according to CDC guidelines.  Your work-up also did show that you are low on potassium, do take the potassium prescribed for a week, potential side effects are excessive potassium since you are on a medication that increases your potassium however since your potassium was low here today I do recommend you taking his potassium for a little while, he can get your potassium levels rechecked in the next 2 to 3 days and your PCP can decide if you need to continue taking your potassium.  If you have any new worsening concerning symptom please come back to the emergency department.  Please follow-up with your PCP in the next couple of days, continue to take your COPD medications.  I also prescribed you prednisone to take over the next couple of days.  Please take your pharmacist about any new medications prescribed today in regards side effects or interactions.  Get help right away if: Your shortness of breath gets worse. You have trouble breathing when you are resting. You feel light-headed or you pass out (faint). You have a cough that is not helped by medicines. You cough up blood. You have pain with breathing. You have pain in your chest, arms, shoulders, or belly (abdomen). You have a fever. You cannot walk up stairs. You cannot exercise the way you normally do.

## 2020-10-07 DIAGNOSIS — M0579 Rheumatoid arthritis with rheumatoid factor of multiple sites without organ or systems involvement: Secondary | ICD-10-CM | POA: Diagnosis not present

## 2020-10-15 DIAGNOSIS — R768 Other specified abnormal immunological findings in serum: Secondary | ICD-10-CM | POA: Diagnosis not present

## 2020-10-15 DIAGNOSIS — M0579 Rheumatoid arthritis with rheumatoid factor of multiple sites without organ or systems involvement: Secondary | ICD-10-CM | POA: Diagnosis not present

## 2020-10-15 DIAGNOSIS — R634 Abnormal weight loss: Secondary | ICD-10-CM | POA: Diagnosis not present

## 2020-10-15 DIAGNOSIS — M797 Fibromyalgia: Secondary | ICD-10-CM | POA: Diagnosis not present

## 2020-10-15 DIAGNOSIS — M25562 Pain in left knee: Secondary | ICD-10-CM | POA: Diagnosis not present

## 2020-10-15 DIAGNOSIS — M25462 Effusion, left knee: Secondary | ICD-10-CM | POA: Diagnosis not present

## 2020-10-15 DIAGNOSIS — M1990 Unspecified osteoarthritis, unspecified site: Secondary | ICD-10-CM | POA: Diagnosis not present

## 2020-10-15 DIAGNOSIS — Z79899 Other long term (current) drug therapy: Secondary | ICD-10-CM | POA: Diagnosis not present

## 2020-10-15 DIAGNOSIS — N1831 Chronic kidney disease, stage 3a: Secondary | ICD-10-CM | POA: Diagnosis not present

## 2020-10-26 ENCOUNTER — Other Ambulatory Visit: Payer: Self-pay

## 2020-10-26 ENCOUNTER — Encounter: Payer: Self-pay | Admitting: Internal Medicine

## 2020-10-26 ENCOUNTER — Ambulatory Visit (INDEPENDENT_AMBULATORY_CARE_PROVIDER_SITE_OTHER): Payer: Medicare Other | Admitting: Internal Medicine

## 2020-10-26 DIAGNOSIS — R0600 Dyspnea, unspecified: Secondary | ICD-10-CM | POA: Diagnosis not present

## 2020-10-26 DIAGNOSIS — R0609 Other forms of dyspnea: Secondary | ICD-10-CM

## 2020-10-26 DIAGNOSIS — J449 Chronic obstructive pulmonary disease, unspecified: Secondary | ICD-10-CM

## 2020-10-26 LAB — CBC WITH DIFFERENTIAL/PLATELET
Basophils Absolute: 0.1 10*3/uL (ref 0.0–0.1)
Basophils Relative: 1.4 % (ref 0.0–3.0)
Eosinophils Absolute: 0.3 10*3/uL (ref 0.0–0.7)
Eosinophils Relative: 3.9 % (ref 0.0–5.0)
HCT: 32.7 % — ABNORMAL LOW (ref 36.0–46.0)
Hemoglobin: 11.2 g/dL — ABNORMAL LOW (ref 12.0–15.0)
Lymphocytes Relative: 13.7 % (ref 12.0–46.0)
Lymphs Abs: 1.1 10*3/uL (ref 0.7–4.0)
MCHC: 34.3 g/dL (ref 30.0–36.0)
MCV: 92.8 fl (ref 78.0–100.0)
Monocytes Absolute: 1.1 10*3/uL — ABNORMAL HIGH (ref 0.1–1.0)
Monocytes Relative: 13.9 % — ABNORMAL HIGH (ref 3.0–12.0)
Neutro Abs: 5.3 10*3/uL (ref 1.4–7.7)
Neutrophils Relative %: 67.1 % (ref 43.0–77.0)
Platelets: 294 10*3/uL (ref 150.0–400.0)
RBC: 3.52 Mil/uL — ABNORMAL LOW (ref 3.87–5.11)
RDW: 14.2 % (ref 11.5–15.5)
WBC: 7.9 10*3/uL (ref 4.0–10.5)

## 2020-10-26 LAB — BASIC METABOLIC PANEL
BUN: 18 mg/dL (ref 6–23)
CO2: 26 mEq/L (ref 19–32)
Calcium: 9.6 mg/dL (ref 8.4–10.5)
Chloride: 99 mEq/L (ref 96–112)
Creatinine, Ser: 1.15 mg/dL (ref 0.40–1.20)
GFR: 46.6 mL/min — ABNORMAL LOW (ref >60.00)
Glucose, Bld: 115 mg/dL — ABNORMAL HIGH (ref 70–99)
Potassium: 3.5 mEq/L (ref 3.5–5.1)
Sodium: 136 mEq/L (ref 135–145)

## 2020-10-26 LAB — BRAIN NATRIURETIC PEPTIDE: Pro B Natriuretic peptide (BNP): 54 pg/mL (ref 0.0–100.0)

## 2020-10-26 LAB — SEDIMENTATION RATE: Sed Rate: 43 mm/hr — ABNORMAL HIGH (ref 0–30)

## 2020-10-26 LAB — D-DIMER, QUANTITATIVE: D-Dimer, Quant: 0.65 mcg/mL FEU — ABNORMAL HIGH (ref <0.50)

## 2020-10-26 LAB — TSH: TSH: 0.51 u[IU]/mL (ref 0.35–4.50)

## 2020-10-26 NOTE — Assessment & Plan Note (Signed)
Quit smoking 2011   02/13/20    Walked RA  2 laps @ approx 255ft each @ moderate pace  stopped due to leg pain and sob sats 96% Worse since covid 09/13/20   - 10/26/2020  Walked 2 laps @ 250 ft each @ slow pace/SOB starting end of lap1/stopped once to rest before lap 2 was done with sats 93%  Symptoms are markedly disproportionate to objective findings and not clear to what extent this is actually a pulmonary  problem but pt does appear to have difficult to sort out respiratory symptoms of unknown origin for which  DDX  = almost all start with A and  include Adherence, Ace Inhibitors, Acid Reflux, Active Sinus Disease, Alpha 1 Antitripsin deficiency, Anxiety masquerading as Airways dz,  ABPA,  Allergy(esp in young), Aspiration (esp in elderly), Adverse effects of meds,  Active smoking or Vaping, A bunch of PE's/clot burden (a few small clots can't cause this syndrome unless there is already severe underlying pulm or vascular dz with poor reserve),  Anemia or thyroid disorder, plus two Bs  = Bronchiectasis and Beta blocker use..and one C= CHF     Adherence is always the initial "prime suspect" and is a multilayered concern that requires a "trust but verify" approach in every patient - starting with knowing how to use medications, especially inhalers, correctly, keeping up with refills and understanding the fundamental difference between maintenance and prns vs those medications only taken for a very short course and then stopped and not refilled.  - hfa reviewed  ? Acid (or non-acid) GERD > always difficult to exclude as up to 75% of pts in some series report no assoc GI/ Heartburn symptoms> rec continue max (24h)  acid suppression and diet restrictions/ reviewed     Anemia  Lab Results  Component Value Date   HGB 11.2 (L) 10/26/2020   HGB 13.2 09/23/2020   HGB 15.9 (H) 02/13/2020  likely from acute infection as normocyctic but needs f/u on return  ? Anxiety/depression/ deconditioning  > usually at  the bottom of this list of usual suspects but may interfere with adherence and also interpretation of response or lack thereof to symptom management which can be quite subjective.  ->>> rec sub max ex between now and when returns for PFTS   ? A bunch of PE's > D dimer   high normal value (seen commonly in the elderly or chronically ill)  may miss small peripheral pe, the clot burden with sob is moderately high and the d dimer  has a very high neg pred value if used in this setting.    ? Chf> ruled out today with bnp < 100    Each maintenance medication was reviewed in detail including emphasizing most importantly the difference between maintenance and prns and under what circumstances the prns are to be triggered using an action plan format where appropriate.  Total time for H and P, chart review, counseling,  directly observing portions of ambulatory 02 saturation study/ and generating customized AVS unique to this post covid  office visit / same day charting = 46 min

## 2020-10-26 NOTE — Assessment & Plan Note (Signed)
Quit smoking in 2011  PFT's 2012: FEV1 1.77 (74%), ratio 55, +airtrapping, TLC normal, DLCO 55% - 12/25/2019  After extensive coaching inhaler device,  effectiveness =    50% > breztri Take 2 puffs first thing in am and then another 2 puffs about 12 hours later.  - 02/13/2020  After extensive coaching inhaler device,  effectiveness =    90%  - [facility] screen  02/13/20 :   Level 185  MM  . Group D in terms of symptom/risk and laba/lama/ICS  therefore appropriate rx at this point >>>  Continue breztri 2bid and approp saba:  I spent extra time with pt today reviewing appropriate use of albuterol for prn use on exertion with the following points: 1) saba is for relief of sob that does not improve by walking a slower pace or resting but rather if the pt does not improve after trying this first. 2) If the pt is convinced, as many are, that saba helps recover from activity faster then it's easy to tell if this is the case by re-challenging : ie stop, take the inhaler, then p 5 minutes try the exact same activity (intensity of workload) that just caused the symptoms and see if they are substantially diminished or not after saba 3) if there is an activity that reproducibly causes the symptoms, try the saba 15 min before the activity on alternate days   If in fact the saba really does help, then fine to continue to use it prn but advised may need to look closer at the maintenance regimen being used to achieve better control of airways disease with exertion.

## 2020-10-26 NOTE — Patient Instructions (Addendum)
To get the most out of exercise, you need to be continuously aware that you are short of breath, but never out of breath, for at least 20 minutes daily with a goal of 30 minutes.   As you improve, it will actually be easier for you to do the same amount of exercise  in  30 minutes so always push to the level where you are short of breath.    Make sure you check your oxygen saturations at highest level of activity   Try albuterol 15 min before an activity that you know would make you short of breath and see if it makes any difference and if makes none then don't take it after activity unless you can't catch your breath.  Please remember to go to the lab department   for your tests - we will call you with the results when they are available.  Return for first available pfts and office visit same day

## 2020-10-26 NOTE — Progress Notes (Signed)
Donna [name], female    DOB: [birth year],     MRN: [id number]   Brief patient profile:  4 yowf with RA quit smoking 2011/MM  with eval by K Clance GOLD 2 no change with spiriva so d/c'd just used ventolin sporadically but around summer 2020 moved to [facility] which is normally just summer vacation spot and there experenced cough/wheeze / sob rx more albuterol up to twice daily so self referrd back to pulmonary clinic 12/25/2019    History of Present Illness  12/25/2019  Pulmonary/ 1st office eval/Merick Kelleher / s/p 2nd [place]  Chief Complaint  Patient presents with  . Consult    pt states uses rescue inhaler everyday. pt states when doing activities sob  Dyspnea:  MMRC2 = can't walk a nl pace on a flat grade s sob but does fine slow and flat  Cough: white phelgm worse in am  Sleep: flat, sleeps two pillows  SABA use: twice daily  Also has omeprazole but not using ac rec Plan A = Automatic = Always=    Breztri Take 2 puffs first thing in am and then another 2 puffs about 12 hours later.  Work on inhaler technique: Plan B = Backup (to supplement plan A, not to replace it) Only use your albuterol inhaler as a rescue medication  Omeprazole Take 30-60 min before first meal of the day  Stop fosfamax until return Stop metaprolol and start Bisoprolol 5 mg one daily (won't interfere with albuterol)     02/13/2020  f/u ov/Aleiya Rye re:  Copd GOLD II/ RA  Had second pfizer 10/07/19  Chief Complaint  Patient presents with  . Follow-up  Dyspnea:  X 50 ft due to L knee and both lower legs ache  Cough: better noct then worse p an hour of stirring/ mucoid min vol   Sleeping: bed is horizontal, sleeps on side one pillow SABA use: breztri /saba  02: none  rec No change medications    10/26/2020  f/u ov/Shaiann Mcmanamon re: copd GOLD II / RA sp covid 09/13/20  Still on bretri 2bid  Chief Complaint  Patient presents with  . Follow-up    Had Covid 19 in Feb 2022- having increased SOB since then. She gets winded walking short  distances such as lobby to exam room today. She has been using her rescue inhaler daily.   Dyspnea:  50 ft and now sob Cough: resolved s/p covid with diarrhea/vomiting  Sleeping: flat bed, on side on pillow  SABA use: no longer needing  - had used it during covid  02: not checking 02 with activity, ok at rest  Covid status: vax x 2 / then covid 19 09/13/20    No obvious day to day or daytime variability or assoc excess/ purulent sputum or mucus plugs or hemoptysis or cp or chest tightness, subjective wheeze or overt sinus or hb symptoms.   Sleeping  without nocturnal  or early am exacerbation  of respiratory  c/o's or need for noct saba. Also denies any obvious fluctuation of symptoms with weather or environmental changes or other aggravating or alleviating factors except as outlined above   No unusual exposure hx or h/o childhood pna/ asthma or knowledge of premature birth.  Current Allergies, Complete Past Medical History, Past Surgical History, Family History, and Social History were reviewed in [employer] record.  ROS  The following are not active complaints unless bolded Hoarseness, sore throat, dysphagia, dental problems, itching, sneezing,  nasal congestion or discharge  of excess mucus or purulent secretions, ear ache,   fever, chills, sweats, unintended wt loss or wt gain, classically pleuritic or exertional cp,  orthopnea pnd or arm/hand swelling  or leg swelling, presyncope, palpitations, abdominal pain, anorexia, nausea, vomiting, diarrhea  or change in bowel habits or change in bladder habits, change in stools or change in urine, dysuria, hematuria,  rash, arthralgias, visual complaints, headache, numbness, weakness or ataxia or problems with walking or coordination,  change in mood or  memory.        Current Meds  Medication Sig  . Abatacept (ORENCIA [place]) Infusion every 30 days per Dr Kathlene November  . albuterol (PROVENTIL HFA;VENTOLIN HFA) 108 (90 Base) MCG/ACT  inhaler Inhale 2 puffs into the lungs every 4 (four) hours as needed for wheezing or shortness of breath.  . bisoprolol (ZEBETA) 5 MG tablet Take 1 tablet (5 mg total) by mouth daily.  . Budeson-Glycopyrrol-Formoterol (BREZTRI AEROSPHERE) 160-9-4.8 MCG/ACT AERO Inhale 2 puffs into the lungs in the morning and at bedtime.  . Cholecalciferol (VITAMIN D3 PO) Take 2,400 Units by mouth daily.  . Coenzyme Q10 (COQ-10) 100 MG CAPS Take 1 capsule by mouth daily.  . DULoxetine (CYMBALTA) 20 MG capsule Take 20 mg by mouth daily.  . Ginger, Zingiber officinalis, (GINGER ROOT PO) Take 1 capsule by mouth daily.  . hydroxychloroquine (PLAQUENIL) 200 MG tablet Take by mouth 2 (two) times daily.   Marland Kitchen leflunomide (ARAVA) 20 MG tablet Take 20 mg by mouth daily.  . magnesium gluconate (MAGONATE) 500 MG tablet Take 500 mg by mouth daily.  . Multiple Vitamins-Minerals (MULTIVITAMIN WOMEN) TABS Take 1 tablet by mouth daily.  Marland Kitchen omeprazole (PRILOSEC) 20 MG capsule Take 20 mg by mouth 2 (two) times daily before a meal.   . triamterene-hydrochlorothiazide (DYAZIDE) 37.5-25 MG capsule Take 1 capsule by mouth daily.  . Turmeric Curcumin 500 MG CAPS Take 1,000 mg by mouth daily.  . vitamin B-12 (CYANOCOBALAMIN) 1000 MCG tablet Take 1,000 mcg by mouth daily.                    Past Medical History:  Diagnosis Date  . Arthritis    lumbar, toes, L knee, cerv. spine & hands   . Cancer ([place])    basal cell removed fr. face   . COPD (chronic obstructive pulmonary disease) ([place])   . GERD (gastroesophageal reflux disease)   . H/O echocardiogram      Objective:    10/26/2020        146   02/13/20 161 lb 3.2 oz (73.1 kg)  12/25/19 172 lb (78 kg)  10/01/15 180 lb 6.4 oz (81.8 kg)    Vital signs reviewed  10/26/2020  - Note at rest 02 sats  96% on RA   General appearance:    amb anxious wf nad    HEENT : pt wearing mask not removed for exam due to covid - 19 concerns.   NECK :  without JVD/Nodes/TM/ nl carotid  upstrokes bilaterally   LUNGS: no acc muscle use,  Min barrel  contour chest wall with bilateral  slightly decreased bs s audible wheeze and  without cough on insp or exp maneuvers and min  Hyperresonant  to  percussion bilaterally     CV:  RRR  no s3 or murmur or increase in P2, and no edema   ABD:  soft and nontender with pos end  insp Hoover's  in the supine position. No bruits or organomegaly appreciated,  bowel sounds nl  MS:   Nl gait/  ext warm without deformities, calf tenderness, cyanosis or clubbing No obvious joint restrictions   SKIN: warm and dry without lesions    NEURO:  alert, approp, nl sensorium with  no motor or cerebellar deficits apparent.           I personally reviewed images and agree with radiology impression as follows:  CXR:   Portable 09/23/20  No active disease.    Labs ordered/ reviewed:      Chemistry      Component Value Date/Time   NA 136 10/26/2020 1515   K 3.5 10/26/2020 1515   CL 99 10/26/2020 1515   CO2 26 10/26/2020 1515   BUN 18 10/26/2020 1515   CREATININE 1.15 10/26/2020 1515      Component Value Date/Time   CALCIUM 9.6 10/26/2020 1515   ALKPHOS 47 09/23/2020 1245   AST 35 09/23/2020 1245   ALT 28 09/23/2020 1245   BILITOT 0.4 09/23/2020 1245        Lab Results  Component Value Date   WBC 7.9 10/26/2020   HGB 11.2 (L) 10/26/2020   HCT 32.7 (L) 10/26/2020   MCV 92.8 10/26/2020   PLT 294.0 10/26/2020     Lab Results  Component Value Date   DDIMER 0.65 (H) 10/26/2020      Lab Results  Component Value Date   TSH 0.51 10/26/2020     Lab Results  Component Value Date   PROBNP 54.0 10/26/2020       Lab Results  Component Value Date   ESRSEDRATE 43 (H) 10/26/2020   ESRSEDRATE 5 02/13/2020            Assessment

## 2020-10-28 NOTE — Progress Notes (Signed)
Spoke with pt and notified of results per Dr. [name]. Pt verbalized understanding and denied any questions. 

## 2020-11-04 DIAGNOSIS — M0579 Rheumatoid arthritis with rheumatoid factor of multiple sites without organ or systems involvement: Secondary | ICD-10-CM | POA: Diagnosis not present

## 2020-12-02 DIAGNOSIS — M0579 Rheumatoid arthritis with rheumatoid factor of multiple sites without organ or systems involvement: Secondary | ICD-10-CM | POA: Diagnosis not present

## 2020-12-06 DIAGNOSIS — N1832 Chronic kidney disease, stage 3b: Secondary | ICD-10-CM | POA: Diagnosis not present

## 2020-12-13 DIAGNOSIS — N1832 Chronic kidney disease, stage 3b: Secondary | ICD-10-CM | POA: Diagnosis not present

## 2020-12-13 DIAGNOSIS — M069 Rheumatoid arthritis, unspecified: Secondary | ICD-10-CM | POA: Diagnosis not present

## 2020-12-13 DIAGNOSIS — I129 Hypertensive chronic kidney disease with stage 1 through stage 4 chronic kidney disease, or unspecified chronic kidney disease: Secondary | ICD-10-CM | POA: Diagnosis not present

## 2020-12-17 ENCOUNTER — Other Ambulatory Visit (HOSPITAL_COMMUNITY)
Admission: RE | Admit: 2020-12-17 | Discharge: 2020-12-17 | Disposition: A | Discharge status: Home / Self Care | Payer: Medicare Other | Source: Ambulatory Visit | Attending: Internal Medicine | Admitting: Internal Medicine

## 2020-12-17 DIAGNOSIS — Z01812 Encounter for preprocedural laboratory examination: Secondary | ICD-10-CM | POA: Diagnosis not present

## 2020-12-17 DIAGNOSIS — Z20822 Contact with and (suspected) exposure to covid-19: Secondary | ICD-10-CM | POA: Insufficient documentation

## 2020-12-17 LAB — SARS CORONAVIRUS 2 (TAT 6-24 HRS): SARS Coronavirus 2: NEGATIVE

## 2020-12-19 ENCOUNTER — Other Ambulatory Visit: Payer: Self-pay | Admitting: Internal Medicine

## 2020-12-19 DIAGNOSIS — I10 Essential (primary) hypertension: Secondary | ICD-10-CM

## 2020-12-20 ENCOUNTER — Other Ambulatory Visit: Payer: Self-pay

## 2020-12-20 ENCOUNTER — Ambulatory Visit (INDEPENDENT_AMBULATORY_CARE_PROVIDER_SITE_OTHER): Payer: Medicare Other | Admitting: Internal Medicine

## 2020-12-20 ENCOUNTER — Encounter: Payer: Self-pay | Admitting: Internal Medicine

## 2020-12-20 ENCOUNTER — Encounter: Payer: Self-pay | Admitting: *Deleted

## 2020-12-20 DIAGNOSIS — R0600 Dyspnea, unspecified: Secondary | ICD-10-CM | POA: Diagnosis not present

## 2020-12-20 DIAGNOSIS — J449 Chronic obstructive pulmonary disease, unspecified: Secondary | ICD-10-CM

## 2020-12-20 DIAGNOSIS — R0609 Other forms of dyspnea: Secondary | ICD-10-CM

## 2020-12-20 LAB — PULMONARY FUNCTION TEST
DL/VA % pred: 50 %
DL/VA: 2.03 ml/min/mmHg/L
DLCO cor % pred: 52 %
DLCO cor: 10.65 ml/min/mmHg
DLCO unc % pred: 52 %
DLCO unc: 10.65 ml/min/mmHg
FEF 25-75 Post: 0.9 L/sec
FEF 25-75 Pre: 0.86 L/sec
FEF2575-%Change-Post: 5 %
FEF2575-%Pred-Post: 50 %
FEF2575-%Pred-Pre: 48 %
FEV1-%Change-Post: 1 %
FEV1-%Pred-Post: 79 %
FEV1-%Pred-Pre: 77 %
FEV1-Post: 1.82 L
FEV1-Pre: 1.8 L
FEV1FVC-%Change-Post: 0 %
FEV1FVC-%Pred-Pre: 83 %
FEV6-%Change-Post: 1 %
FEV6-%Pred-Post: 96 %
FEV6-%Pred-Pre: 94 %
FEV6-Post: 2.81 L
FEV6-Pre: 2.76 L
FEV6FVC-%Change-Post: 0 %
FEV6FVC-%Pred-Post: 101 %
FEV6FVC-%Pred-Pre: 101 %
FVC-%Change-Post: 1 %
FVC-%Pred-Post: 94 %
FVC-%Pred-Pre: 93 %
FVC-Post: 2.89 L
FVC-Pre: 2.86 L
Post FEV1/FVC ratio: 63 %
Post FEV6/FVC ratio: 97 %
Pre FEV1/FVC ratio: 63 %
Pre FEV6/FVC Ratio: 97 %
RV % pred: 156 %
RV: 3.74 L
TLC % pred: 124 %
TLC: 6.66 L

## 2020-12-20 NOTE — Progress Notes (Signed)
PFT done today. 

## 2020-12-20 NOTE — Patient Instructions (Signed)
No change in medications   Please schedule a follow up visit in 12  months but call sooner if needed  

## 2020-12-20 NOTE — Assessment & Plan Note (Signed)
Quit smoking in 2011  PFT's 2012: FEV1 1.77 (74%), ratio 55, +airtrapping, TLC normal, DLCO 55% - 12/25/2019  After extensive coaching inhaler device,  effectiveness =    50% > breztri Take 2 puffs first thing in am and then another 2 puffs about 12 hours later.  - 02/13/2020  After extensive coaching inhaler device,  effectiveness =    90%  - [facility] screen  02/13/20 :   Level 185  MM - PFT's  12/20/2020  FEV1 1.82 (1 % ) ratio 0.63  p 1 % improvement from saba p breztri prior to study with DLCO  10.65 (52%) corrects to 2.03 (49%)  for alv volume and FV curve mild concavity    Group D in terms of symptom/risk and laba/lama/ICS  therefore appropriate rx at this point >>>  Continue breztri and prn saba          Each maintenance medication was reviewed in detail including emphasizing most importantly the difference between maintenance and prns and under what circumstances the prns are to be triggered using an action plan format where appropriate.  Total time for H and P, chart review, counseling, reviewing hfa  device(s) and generating customized AVS unique to this office visit / same day charting = 20 min

## 2020-12-20 NOTE — Progress Notes (Signed)
Donna [name], female    DOB: 06-18-1946,     MRN: [id number]   Brief patient profile:  86 yowf with RA quit smoking 2011/MM  with eval by Sabino Donovan GOLD 2 no change with spiriva so d/c'd just used ventolin sporadically but around summer 2020 moved to [facility] which is normally just summer vacation spot and there experenced cough/wheeze / sob rx more albuterol up to twice daily so self referrd back to pulmonary clinic 12/25/2019    History of Present Illness  12/25/2019  Pulmonary/ 1st office eval/Dhruva Orndoff / s/p 2nd [place]  Chief Complaint  Patient presents with  . Consult    pt states uses rescue inhaler everyday. pt states when doing activities sob  Dyspnea:  MMRC2 = can't walk a nl pace on a flat grade s sob but does fine slow and flat  Cough: white phelgm worse in am  Sleep: flat, sleeps two pillows  SABA use: twice daily  Also has omeprazole but not using ac rec Plan A = Automatic = Always=    Breztri Take 2 puffs first thing in am and then another 2 puffs about 12 hours later.  Work on inhaler technique: Plan B = Backup (to supplement plan A, not to replace it) Only use your albuterol inhaler as a rescue medication  Omeprazole Take 30-60 min before first meal of the day  Stop fosfamax until return Stop metaprolol and start Bisoprolol 5 mg one daily (won't interfere with albuterol)     02/13/2020  f/u ov/Aydian Dimmick re:  Copd GOLD II/ RA  Had second pfizer 10/07/19  Chief Complaint  Patient presents with  . Follow-up  Dyspnea:  X 50 ft due to L knee and both lower legs ache  Cough: better noct then worse p an hour of stirring/ mucoid min vol   Sleeping: bed is horizontal, sleeps on side one pillow SABA use: breztri /saba  02: none  rec No change medications    10/26/2020  f/u ov/Tamala Manzer re: copd GOLD II / RA sp covid 09/13/20  Still on bretri 2bid  Chief Complaint  Patient presents with  . Follow-up    Had Covid 19 in Feb 2022- having increased SOB since then. She gets winded walking short  distances such as lobby to exam room today. She has been using her rescue inhaler daily.   Dyspnea:  50 ft and now sob Cough: resolved s/p covid with diarrhea/vomiting  Sleeping: flat bed, on side on pillow  SABA use: no longer needing  - had used it during covid  02: not checking 02 with activity, ok at rest  Covid status: vax x 2 / then covid 19 09/13/20  rec To get the most out of exercise, you need to be continuously aware that you are short of breath, but never out of breath, for at least 20 minutes daily with a goal of 30 minutes.   As you improve, it will actually be easier for you to do the same amount of exercise  in  30 minutes so always push to the level where you are short of breath.    Make sure you check your oxygen saturations at highest level of activity   Try albuterol 15 min before an activity that you know would make you short of breath and see if it makes any difference and if makes none then don't take it after activity unless you can't catch your breath.   12/20/2020  f/u ov/Elnoria Livingston re: copd GOLD II  Chief Complaint  Patient presents with  . Follow-up    DOE  Dyspnea:  Up [place] to car then flat walking x 15 min flat surface around neighborhood Cough: none  Sleeping: flat on one pillow SABA use: none  02: none  Covid status:   Vaccine x 2 / omicron 08/2020   No obvious day to day or daytime variability or assoc excess/ purulent sputum or mucus plugs or hemoptysis or cp or chest tightness, subjective wheeze or overt sinus or hb symptoms.   Sleeping without nocturnal  or early am exacerbation  of respiratory  c/o's or need for noct saba. Also denies any obvious fluctuation of symptoms with weather or environmental changes or other aggravating or alleviating factors except as outlined above   No unusual exposure hx or h/o childhood pna/ asthma or knowledge of premature birth.  Current Allergies, Complete Past Medical History, Past Surgical History, Family History, and  Social History were reviewed in [employer] record.  ROS  The following are not active complaints unless bolded Hoarseness, sore throat, dysphagia, dental problems, itching, sneezing,  nasal congestion or discharge of excess mucus or purulent secretions, ear ache,   fever, chills, sweats, unintended wt loss or wt gain, classically pleuritic or exertional cp,  orthopnea pnd or arm/hand swelling  or leg swelling, presyncope, palpitations, abdominal pain, anorexia, nausea, vomiting, diarrhea  or change in bowel habits or change in bladder habits, change in stools or change in urine, dysuria, hematuria,  rash, arthralgias, visual complaints, headache, numbness, weakness or ataxia or problems with walking or coordination,  change in mood or  memory.        Current Meds  Medication Sig  . Abatacept (ORENCIA [place]) Infusion every 30 days per Dr Kathlene November  . albuterol (PROVENTIL HFA;VENTOLIN HFA) 108 (90 Base) MCG/ACT inhaler Inhale 2 puffs into the lungs every 4 (four) hours as needed for wheezing or shortness of breath.  . bisoprolol (ZEBETA) 5 MG tablet TAKE 1 TABLET(5 MG) BY MOUTH DAILY  . Budeson-Glycopyrrol-Formoterol (BREZTRI AEROSPHERE) 160-9-4.8 MCG/ACT AERO Inhale 2 puffs into the lungs in the morning and at bedtime.  . Cholecalciferol (VITAMIN D3 PO) Take 2,400 Units by mouth daily.  . Coenzyme Q10 (COQ-10) 100 MG CAPS Take 1 capsule by mouth daily.  . DULoxetine (CYMBALTA) 20 MG capsule Take 20 mg by mouth daily.  . Ginger, Zingiber officinalis, (GINGER ROOT PO) Take 1 capsule by mouth daily.  . hydroxychloroquine (PLAQUENIL) 200 MG tablet Take by mouth 2 (two) times daily.   Marland Kitchen leflunomide (ARAVA) 20 MG tablet Take 20 mg by mouth daily.  . magnesium gluconate (MAGONATE) 500 MG tablet Take 500 mg by mouth daily.  . Multiple Vitamins-Minerals (MULTIVITAMIN WOMEN) TABS Take 1 tablet by mouth daily.  Marland Kitchen omeprazole (PRILOSEC) 20 MG capsule Take 20 mg by mouth 2 (two) times daily  before a meal.   . triamterene-hydrochlorothiazide (DYAZIDE) 37.5-25 MG capsule Take 1 capsule by mouth daily.  . Turmeric Curcumin 500 MG CAPS Take 1,000 mg by mouth daily.  . vitamin B-12 (CYANOCOBALAMIN) 1000 MCG tablet Take 1,000 mcg by mouth daily.                   Past Medical History:  Diagnosis Date  . Arthritis    lumbar, toes, L knee, cerv. spine & hands   . Cancer ([place])    basal cell removed fr. face   . COPD (chronic obstructive pulmonary disease) ([place])   . GERD (gastroesophageal  reflux disease)   . H/O echocardiogram      Objective:     12/20/2020        154 10/26/2020        146   02/13/20 161 lb 3.2 oz (73.1 kg)  12/25/19 172 lb (78 kg)  10/01/15 180 lb 6.4 oz (81.8 kg)    Vital signs reviewed  12/20/2020  - Note at rest 02 sats  95% on RA   General appearance:    amb wm nad       HEENT : pt wearing mask not removed for exam due to covid - 19 concerns.   NECK :  without JVD/Nodes/TM/ nl carotid upstrokes bilaterally   LUNGS: no acc muscle use,  Min barrel  contour chest wall with bilateral  slightly decreased bs s audible wheeze and  without cough on insp or exp maneuvers and min  Hyperresonant  to  percussion bilaterally     CV:  RRR  no s3 or murmur or increase in P2, and no edema   ABD:  soft and nontender with pos end  insp Hoover's  in the supine position. No bruits or organomegaly appreciated, bowel sounds nl  MS:   Nl gait/  ext warm without deformities, calf tenderness, cyanosis or clubbing No obvious joint restrictions   SKIN: warm and dry without lesions    NEURO:  alert, approp, nl sensorium with  no motor or cerebellar deficits apparent.              Assessment

## 2020-12-30 DIAGNOSIS — M0579 Rheumatoid arthritis with rheumatoid factor of multiple sites without organ or systems involvement: Secondary | ICD-10-CM | POA: Diagnosis not present

## 2021-01-09 ENCOUNTER — Other Ambulatory Visit: Payer: Self-pay | Admitting: Internal Medicine

## 2021-01-18 DIAGNOSIS — R202 Paresthesia of skin: Secondary | ICD-10-CM | POA: Diagnosis not present

## 2021-01-18 DIAGNOSIS — M25462 Effusion, left knee: Secondary | ICD-10-CM | POA: Diagnosis not present

## 2021-01-18 DIAGNOSIS — Z79899 Other long term (current) drug therapy: Secondary | ICD-10-CM | POA: Diagnosis not present

## 2021-01-18 DIAGNOSIS — M1990 Unspecified osteoarthritis, unspecified site: Secondary | ICD-10-CM | POA: Diagnosis not present

## 2021-01-18 DIAGNOSIS — N1831 Chronic kidney disease, stage 3a: Secondary | ICD-10-CM | POA: Diagnosis not present

## 2021-01-18 DIAGNOSIS — M0579 Rheumatoid arthritis with rheumatoid factor of multiple sites without organ or systems involvement: Secondary | ICD-10-CM | POA: Diagnosis not present

## 2021-01-18 DIAGNOSIS — M25562 Pain in left knee: Secondary | ICD-10-CM | POA: Diagnosis not present

## 2021-01-18 DIAGNOSIS — R768 Other specified abnormal immunological findings in serum: Secondary | ICD-10-CM | POA: Diagnosis not present

## 2021-01-18 DIAGNOSIS — M797 Fibromyalgia: Secondary | ICD-10-CM | POA: Diagnosis not present

## 2021-01-18 DIAGNOSIS — G5600 Carpal tunnel syndrome, unspecified upper limb: Secondary | ICD-10-CM | POA: Diagnosis not present

## 2021-01-24 ENCOUNTER — Encounter: Payer: Self-pay | Admitting: Neurology

## 2021-01-27 DIAGNOSIS — M0579 Rheumatoid arthritis with rheumatoid factor of multiple sites without organ or systems involvement: Secondary | ICD-10-CM | POA: Diagnosis not present

## 2021-02-24 DIAGNOSIS — M0579 Rheumatoid arthritis with rheumatoid factor of multiple sites without organ or systems involvement: Secondary | ICD-10-CM | POA: Diagnosis not present

## 2021-03-02 ENCOUNTER — Other Ambulatory Visit: Payer: Self-pay

## 2021-03-02 ENCOUNTER — Ambulatory Visit (INDEPENDENT_AMBULATORY_CARE_PROVIDER_SITE_OTHER): Payer: Medicare Other | Admitting: Neurology

## 2021-03-02 DIAGNOSIS — R202 Paresthesia of skin: Secondary | ICD-10-CM

## 2021-03-02 DIAGNOSIS — G5603 Carpal tunnel syndrome, bilateral upper limbs: Secondary | ICD-10-CM

## 2021-03-02 DIAGNOSIS — G5622 Lesion of ulnar nerve, left upper limb: Secondary | ICD-10-CM

## 2021-03-02 NOTE — Procedures (Signed)
[facility]  [place], [address]  [place], [place] [address] Tel: [phone] Fax:  [phone] Test Date:  03/02/2021  Patient: Donna [name] DOB: October 09, 1945 Physician: Narda Amber, DO  Sex: Female Height: '5\' 6"'$  Ref Phys: Lahoma Rocker, MD  ID#: [id number]   Technician:    Patient Complaints: This is a 75 year old female referred for evaluation of bilateral hand numbness and tingling.  NCV & EMG Findings: Extensive electrodiagnostic testing of the right upper extremity and additional studies of the left shows:  Right median sensory response is absent.  Left median sensory response shows prolonged latency (5.4 ms) and reduced amplitude (7.7 V).  Left ulnar sensory response shows prolonged latency (4.4 ms).  Right ulnar sensory responses within normal limits. Right median motor response shows severely prolonged latency (7.2 ms) and reduced amplitude (2.4 mV).  Left median motor response shows prolonged latency (4.7 ms).  Left ulnar motor response shows reduced amplitude (5.0 mV) and decreased conduction velocity across the elbow (A Elbow-B Elbow, 38 m/s).  Right ulnar motor responses within normal limits. Chronic motor axonal loss changes are seen affecting bilateral abductor pollicis brevis muscles and the left innervated muscles.  There is no evidence of accompanying active denervation.  Impression: Right median neuropathy at or distal to the wrist (severe), consistent with a clinical diagnosis of carpal tunnel syndrome.   Left median neuropathy at or distal to the wrist (moderate-to-severe), consistent with a clinical diagnosis of carpal tunnel syndrome.   Left ulnar neuropathy with slowing across the elbow, with demyelinating and axonal features, moderate.   ___________________________ Narda Amber, DO    Nerve Conduction Studies Anti Sensory Summary Table   Stim Site NR Peak (ms) Norm Peak (ms) P-T Amp (V) Norm P-T Amp  Left Median Anti Sensory (2nd Digit)   34C  Wrist    5.4 <3.8 7.7 >10  Right Median Anti Sensory (2nd Digit)  34C  Wrist NR  <3.8  >10  Left Ulnar Anti Sensory (5th Digit)  34C  Wrist    4.4 <3.2 9.8 >5  Right Ulnar Anti Sensory (5th Digit)  34C  Wrist    3.2 <3.2 9.5 >5   Motor Summary Table   Stim Site NR Onset (ms) Norm Onset (ms) O-P Amp (mV) Norm O-P Amp Site1 Site2 Delta-0 (ms) Dist (cm) Vel (m/s) Norm Vel (m/s)  Left Median Motor (Abd Poll Brev)  34C  Wrist    4.7 <4.0 5.3 >5 Elbow Wrist 5.4 30.0 56 >50  Elbow    10.1  5.0         Right Median Motor (Abd Poll Brev)  34C  Wrist    7.2 <4.0 2.4 >5 Elbow Wrist 5.2 30.0 58 >50  Elbow    12.4  2.1         Left Ulnar Motor (Abd Dig Minimi)  34C  Wrist    2.7 <3.1 5.0 >7 B Elbow Wrist 3.8 22.0 58 >50  B Elbow    6.5  4.5  A Elbow B Elbow 2.6 10.0 38 >50  A Elbow    9.1  4.5         Right Ulnar Motor (Abd Dig Minimi)  34C  Wrist    2.3 <3.1 10.0 >7 B Elbow Wrist 3.6 22.0 61 >50  B Elbow    5.9  9.4  A Elbow B Elbow 1.6 10.0 63 >50  A Elbow    7.5  9.0  EMG   Side Muscle Ins Act Fibs Psw Fasc Number Recrt Dur Dur. Amp Amp. Poly Poly. Comment  Right 1stDorInt Nml Nml Nml Nml Nml Nml Nml Nml Nml Nml Nml Nml N/A  Right Abd Poll Brev Nml Nml Nml Nml 3- Rapid Many 1+ Many 1+ Many 1+ N/A  Right PronatorTeres Nml Nml Nml Nml Nml Nml Nml Nml Nml Nml Nml Nml N/A  Right Biceps Nml Nml Nml Nml Nml Nml Nml Nml Nml Nml Nml Nml N/A  Right Triceps Nml Nml Nml Nml Nml Nml Nml Nml Nml Nml Nml Nml N/A  Right Deltoid Nml Nml Nml Nml Nml Nml Nml Nml Nml Nml Nml Nml N/A  Left 1stDorInt Nml Nml Nml Nml 2- Rapid Many 1+ Many 1+ Many 1+ N/A  Left Abd Poll Brev Nml Nml Nml Nml 1- Rapid Few 1+ Few 1+ Some 1+ N/A  Left PronatorTeres Nml Nml Nml Nml Nml Nml Nml Nml Nml Nml Nml Nml N/A  Left Biceps Nml Nml Nml Nml Nml Nml Nml Nml Nml Nml Nml Nml N/A  Left Triceps Nml Nml Nml Nml Nml Nml Nml Nml Nml Nml Nml Nml N/A  Left Deltoid Nml Nml Nml Nml Nml Nml Nml Nml Nml Nml Nml Nml N/A   Left ABD Dig Min Nml Nml Nml Nml 2- Rapid Many 1+ Many 1+ Many 1+ N/A  Left FlexDigProf 4,5 Nml Nml Nml Nml 1- Rapid Some 1+ Some 1+ Some 1+ N/A      Waveforms:

## 2021-03-03 ENCOUNTER — Encounter: Payer: Self-pay | Admitting: Neurology

## 2021-03-04 DIAGNOSIS — N1831 Chronic kidney disease, stage 3a: Secondary | ICD-10-CM | POA: Diagnosis not present

## 2021-03-04 DIAGNOSIS — G5620 Lesion of ulnar nerve, unspecified upper limb: Secondary | ICD-10-CM | POA: Diagnosis not present

## 2021-03-04 DIAGNOSIS — Z79899 Other long term (current) drug therapy: Secondary | ICD-10-CM | POA: Diagnosis not present

## 2021-03-04 DIAGNOSIS — R202 Paresthesia of skin: Secondary | ICD-10-CM | POA: Diagnosis not present

## 2021-03-04 DIAGNOSIS — M1990 Unspecified osteoarthritis, unspecified site: Secondary | ICD-10-CM | POA: Diagnosis not present

## 2021-03-04 DIAGNOSIS — M0579 Rheumatoid arthritis with rheumatoid factor of multiple sites without organ or systems involvement: Secondary | ICD-10-CM | POA: Diagnosis not present

## 2021-03-04 DIAGNOSIS — R768 Other specified abnormal immunological findings in serum: Secondary | ICD-10-CM | POA: Diagnosis not present

## 2021-03-04 DIAGNOSIS — M797 Fibromyalgia: Secondary | ICD-10-CM | POA: Diagnosis not present

## 2021-03-04 DIAGNOSIS — G5600 Carpal tunnel syndrome, unspecified upper limb: Secondary | ICD-10-CM | POA: Diagnosis not present

## 2021-03-04 DIAGNOSIS — M25462 Effusion, left knee: Secondary | ICD-10-CM | POA: Diagnosis not present

## 2021-03-04 DIAGNOSIS — M25562 Pain in left knee: Secondary | ICD-10-CM | POA: Diagnosis not present

## 2021-03-31 DIAGNOSIS — M0579 Rheumatoid arthritis with rheumatoid factor of multiple sites without organ or systems involvement: Secondary | ICD-10-CM | POA: Diagnosis not present

## 2021-04-11 DIAGNOSIS — Z1231 Encounter for screening mammogram for malignant neoplasm of breast: Secondary | ICD-10-CM | POA: Diagnosis not present

## 2021-04-12 DIAGNOSIS — X32XXXD Exposure to sunlight, subsequent encounter: Secondary | ICD-10-CM | POA: Diagnosis not present

## 2021-04-12 DIAGNOSIS — L820 Inflamed seborrheic keratosis: Secondary | ICD-10-CM | POA: Diagnosis not present

## 2021-04-12 DIAGNOSIS — L570 Actinic keratosis: Secondary | ICD-10-CM | POA: Diagnosis not present

## 2021-04-12 DIAGNOSIS — L720 Epidermal cyst: Secondary | ICD-10-CM | POA: Diagnosis not present

## 2021-04-18 ENCOUNTER — Ambulatory Visit (INDEPENDENT_AMBULATORY_CARE_PROVIDER_SITE_OTHER): Payer: Medicare Other | Admitting: Neurology

## 2021-04-18 ENCOUNTER — Other Ambulatory Visit: Payer: Self-pay

## 2021-04-18 VITALS — BP 134/80 | HR 84 | Ht 66.0 in | Wt 158.0 lb

## 2021-04-18 DIAGNOSIS — G5603 Carpal tunnel syndrome, bilateral upper limbs: Secondary | ICD-10-CM

## 2021-04-18 DIAGNOSIS — R200 Anesthesia of skin: Secondary | ICD-10-CM | POA: Diagnosis not present

## 2021-04-18 DIAGNOSIS — G5622 Lesion of ulnar nerve, left upper limb: Secondary | ICD-10-CM | POA: Diagnosis not present

## 2021-04-18 NOTE — Patient Instructions (Addendum)
Start using a wrist brace at night time  Nerve testing of the legs.  Do not apply lotion or oil to your legs and feet on the day of testing

## 2021-04-18 NOTE — Progress Notes (Signed)
[facility] Clinic Note - Initial Visit   Date: 04/18/21  Donna [name] MRN: [id number] DOB: January 23, 1946   Dear Dr. Kathlene November:  Thank you for your kind referral of Donna [name] for consultation of entrapment neuropathy. Although her history is well known to you, please allow Donna [name] to reiterate it for the purpose of our medical record. The patient was accompanied to the clinic by self.    History of Present Illness: Donna [name] is a 75 y.o. right-handed female with RA, COPD, GERD, and hypertension presenting for evaluation of bilateral CTS and left ulnar neuropathy.  Starting in 2021, she began having numbness/tingling in the hands and waking her up from sleeping.  She underwent NCS/EMG of the arms in August 2022 which showed bilateral carpal tunnel syndrome, worse on the right.  She hand some weakness in the right hand and tends to drop things frequently.  She received steroid injection to the right wrist which has helped her numbness/tingling.  She also gets relief with using a wrist brace.  With respect to left ulnar neuropathy, she admits to sleeping on her left with arm flexed and has noticed that her arm will falls asleep.   She also complains of numbness in the feet for the past 3-4 years, especially involving the toes and soles of the feet.  Symptoms are constant.  No exacerbating or alleviating factors.  She endorses imbalance and suffered a fall after missing the last step when coming down her stairs. She walks unassisted and uses a cane only if her left knee pain is bothering her.   She drinks wine several nights per week.  Nonsmoker. No personal history of diabetes.  She lives with husband in a two level home.   Out-side paper records, electronic medical record, and images have been reviewed where available and summarized as:  NCS/EMG of the arms 03/02/2021: Right median neuropathy at or distal to the wrist (severe), consistent with a clinical diagnosis of  carpal tunnel syndrome.   Left median neuropathy at or distal to the wrist (moderate-to-severe), consistent with a clinical diagnosis of carpal tunnel syndrome.   Left ulnar neuropathy with slowing across the elbow, with demyelinating and axonal features, moderate.   Lab Results  Component Value Date   TSH 0.51 10/26/2020   Lab Results  Component Value Date   ESRSEDRATE 43 (H) 10/26/2020    Past Medical History:  Diagnosis Date   Arthritis    lumbar, toes, L knee, cerv. spine & hands    Cancer ([place])    basal cell removed fr. face    COPD (chronic obstructive pulmonary disease) (HCC)    GERD (gastroesophageal reflux disease)    H/O echocardiogram    Hiatal hernia    History of hiatal hernia    Hypertension    Hypertension    currently followed for BP / heart management with Dr. Leanora Ivanoff, but prev. saw Dr. Einar Gip    Past Surgical History:  Procedure Laterality Date   BACK SURGERY  1994   cerv. spine fusion    BREAST ENHANCEMENT SURGERY     BREAST SURGERY  1980's   implants- bilateral    CERVICAL SPINE SURGERY  1992   fusion   JOINT REPLACEMENT Right 2007   LUMBAR LAMINECTOMY/DECOMPRESSION MICRODISCECTOMY Left 10/07/2015   Procedure: Lumbar three-five Decompressive lumbar Laminectomy & Left Lumbar four-five Microdiscectomy;  Surgeon: Jovita Gamma, MD;  Location: [facility];  Service: Neurosurgery;  Laterality: Left;   TONSILLECTOMY  TOTAL HIP ARTHROPLASTY  2007   Right   TUBAL LIGATION       Medications:  Outpatient Encounter Medications as of 04/18/2021  Medication Sig   Abatacept (ORENCIA [place]) Infusion every 30 days per Dr Kathlene November   albuterol (PROVENTIL HFA;VENTOLIN HFA) 108 (90 Base) MCG/ACT inhaler Inhale 2 puffs into the lungs every 4 (four) hours as needed for wheezing or shortness of breath.   bisoprolol (ZEBETA) 5 MG tablet TAKE 1 TABLET(5 MG) BY MOUTH DAILY   Budeson-Glycopyrrol-Formoterol (BREZTRI AEROSPHERE) 160-9-4.8 MCG/ACT AERO INHALE 2 PUFFS INTO  THE LUNGS IN THE MORNING AND AT BEDTIME   Cholecalciferol (VITAMIN D3 PO) Take 2,400 Units by mouth daily.   Coenzyme Q10 (COQ-10) 100 MG CAPS Take 1 capsule by mouth daily.   DULoxetine (CYMBALTA) 20 MG capsule Take 20 mg by mouth daily.   Ginger, Zingiber officinalis, (GINGER ROOT PO) Take 1 capsule by mouth daily.   hydroxychloroquine (PLAQUENIL) 200 MG tablet Take by mouth 2 (two) times daily.    leflunomide (ARAVA) 20 MG tablet Take 20 mg by mouth daily.   magnesium gluconate (MAGONATE) 500 MG tablet Take 500 mg by mouth daily.   Multiple Vitamins-Minerals (MULTIVITAMIN WOMEN) TABS Take 1 tablet by mouth daily.   omeprazole (PRILOSEC) 20 MG capsule Take 20 mg by mouth 2 (two) times daily before a meal.    triamterene-hydrochlorothiazide (DYAZIDE) 37.5-25 MG capsule Take 1 capsule by mouth daily.   Turmeric Curcumin 500 MG CAPS Take 1,000 mg by mouth daily.   vitamin B-12 (CYANOCOBALAMIN) 1000 MCG tablet Take 1,000 mcg by mouth daily.   potassium chloride SA (KLOR-CON) 20 MEQ tablet Take 1 tablet (20 mEq total) by mouth daily for 7 days.   No facility-administered encounter medications on file as of 04/18/2021.    Allergies:  Allergies  Allergen Reactions   Thorazine [Chlorpromazine] Anaphylaxis    Family History: Family History  Problem Relation Age of Onset   Heart disease Maternal Aunt    Heart disease Maternal Aunt    Lung cancer Sister    Kidney cancer Mother    Kidney cancer Maternal Uncle    Kidney cancer Maternal Aunt     Social History: Social History   Tobacco Use   Smoking status: Former    Packs/day: 0.70    Years: 24.00    Pack years: 16.80    Types: Cigarettes    Quit date: 07/31/2009    Years since quitting: 11.7   Smokeless tobacco: Former    Quit date: 10/01/1998  Substance Use Topics   Alcohol use: Yes    Comment: wine- almost daily   Drug use: No   Social History   Social History Narrative   ** Merged History Encounter **       Pt is  divorced and now lives with [employer].     Vital Signs:  BP 134/80   Pulse 84   Ht '5\' 6"'$  (1.676 m)   Wt 158 lb (71.7 kg)   SpO2 93%   BMI 25.50 kg/m    Neurological Exam: MENTAL STATUS including orientation to time, place, person, recent and remote memory, attention span and concentration, language, and fund of knowledge is normal.  Speech is not dysarthric.  CRANIAL NERVES: II:  No visual field defects.   III-IV-VI: Pupils equal round and reactive to light.  Normal conjugate, extra-ocular eye movements in all directions of gaze.  No nystagmus.  No ptosis.   V:  Normal facial sensation.  VII:  Normal facial symmetry and movements.   VIII:  Normal hearing and vestibular function.   IX-X:  Normal palatal movement.   XI:  Normal shoulder shrug and head rotation.   XII:  Normal tongue strength and range of motion, no deviation or fasciculation.  MOTOR:  Moderate ABP atrophy bilaterally.  No fasciculations or abnormal movements.  No pronator drift.   Upper Extremity:  Right  Left  Deltoid  5/5   5/5   Biceps  5/5   5/5   Triceps  5/5   5/5   Infraspinatus 5/5  5/5  Medial pectoralis 5/5  5/5  Wrist extensors  5/5   5/5   Wrist flexors  5/5   5/5   Finger extensors  5/5   5/5   Finger flexors  5/5   5/5   Dorsal interossei  5/5   5/5   Abductor pollicis  4/5   4/5   Tone (Ashworth scale)  0  0   Lower Extremity:  Right  Left  Hip flexors  5/5   5/5   Hip extensors  5/5   5/5   Adductor 5/5  5/5  Abductor 5/5  5/5  Knee flexors  5/5   5/5   Knee extensors  5/5   5/5   Dorsiflexors  5/5   5/5   Plantarflexors  5/5   5/5   Toe extensors  5/5   5/5   Toe flexors  5/5   5/5   Tone (Ashworth scale)  0  0   MSRs:  Right        Left                  brachioradialis 2+  2+  biceps 2+  2+  triceps 2+  2+  patellar 2+  2+  ankle jerk 2+  2+  Hoffman no  no  plantar response down  down   SENSORY:  Mildly reduced vibration below the ankles.  Temperature also  slightly reduced.  Pin prick intact.  Romberg's sign absent.   COORDINATION/GAIT: Normal finger-to- nose-finger.  Intact rapid alternating movements bilaterally.  Gait mildly wide-based, unassisted. Unsteady with stressed and tandem gait.   IMPRESSION: Bilateral feet paresthesias, ?neuropathy vs radiculopathy  - NCS/EMG of the legs  2.  Bilateral carpal tunnel syndrome, worse on the right (severe), left is moderate-to-severe.  She experienced significant benefit in pain relief with steroid injection to right wrist.   - Encouraged to use wrist splints nightly  - If symptoms do not improve, we can refer to hand specialist for CTS release  3.  Left ulnar neuropathy at the elbow  - Strategies to minimize nerve compression at the elbow discussed  Further recommendations pending results.  Total time spent reviewing records, interview, history/exam, documentation, and coordination of care on day of encounter:  40- min   Thank you for allowing me to participate in patient's care.  If I can answer any additional questions, I would be pleased to do so.    Sincerely,    Randol Zumstein K. Posey Pronto, DO

## 2021-04-20 DIAGNOSIS — G5600 Carpal tunnel syndrome, unspecified upper limb: Secondary | ICD-10-CM | POA: Diagnosis not present

## 2021-04-20 DIAGNOSIS — M1990 Unspecified osteoarthritis, unspecified site: Secondary | ICD-10-CM | POA: Diagnosis not present

## 2021-04-20 DIAGNOSIS — R768 Other specified abnormal immunological findings in serum: Secondary | ICD-10-CM | POA: Diagnosis not present

## 2021-04-20 DIAGNOSIS — M25562 Pain in left knee: Secondary | ICD-10-CM | POA: Diagnosis not present

## 2021-04-20 DIAGNOSIS — M797 Fibromyalgia: Secondary | ICD-10-CM | POA: Diagnosis not present

## 2021-04-20 DIAGNOSIS — M0579 Rheumatoid arthritis with rheumatoid factor of multiple sites without organ or systems involvement: Secondary | ICD-10-CM | POA: Diagnosis not present

## 2021-04-20 DIAGNOSIS — M25462 Effusion, left knee: Secondary | ICD-10-CM | POA: Diagnosis not present

## 2021-04-20 DIAGNOSIS — N1831 Chronic kidney disease, stage 3a: Secondary | ICD-10-CM | POA: Diagnosis not present

## 2021-04-20 DIAGNOSIS — Z79899 Other long term (current) drug therapy: Secondary | ICD-10-CM | POA: Diagnosis not present

## 2021-04-20 DIAGNOSIS — R202 Paresthesia of skin: Secondary | ICD-10-CM | POA: Diagnosis not present

## 2021-04-28 DIAGNOSIS — M0579 Rheumatoid arthritis with rheumatoid factor of multiple sites without organ or systems involvement: Secondary | ICD-10-CM | POA: Diagnosis not present

## 2021-05-03 DIAGNOSIS — M25562 Pain in left knee: Secondary | ICD-10-CM | POA: Diagnosis not present

## 2021-05-10 DIAGNOSIS — M25562 Pain in left knee: Secondary | ICD-10-CM | POA: Diagnosis not present

## 2021-05-12 ENCOUNTER — Other Ambulatory Visit: Payer: Self-pay

## 2021-05-12 ENCOUNTER — Ambulatory Visit (INDEPENDENT_AMBULATORY_CARE_PROVIDER_SITE_OTHER): Payer: Medicare Other | Admitting: Neurology

## 2021-05-12 DIAGNOSIS — R200 Anesthesia of skin: Secondary | ICD-10-CM | POA: Diagnosis not present

## 2021-05-12 DIAGNOSIS — G629 Polyneuropathy, unspecified: Secondary | ICD-10-CM

## 2021-05-12 NOTE — Procedures (Signed)
[facility]  [address]  [place], [place] [address] Tel: [phone] Fax:  [phone] Test Date:  05/12/2021  Patient: Donna [name] DOB: 1945/11/09 Physician: Narda Amber, DO  Sex: Female Height: 5\' 6"  Ref Phys: Narda Amber, DO  ID#: [id number]   Technician:    Patient Complaints: This is a 75 year old female with rheumatoid arthritis referred for evaluation of bilateral feet numbness and tingling.  NCV & EMG Findings: Extensive electrodiagnostic testing of the right lower extremity and additional studies of the left shows:  Bilateral sural and superficial peroneal sensory responses are absent. Bilateral peroneal motor responses at the extensor digitorum brevis are absent, and normal at the tibialis anterior.  Bilateral tibial motor responses are within normal limits. Bilateral tibial H reflex study shows prolonged latencies. Chronic motor axonal loss changes are seen affecting the flexor digitorum longus muscles bilaterally, without accompanied active denervation.  Impression: The electrophysiologic findings are consistent with a chronic sensorimotor axonal polyneuropathy affecting the lower extremities.   ___________________________ Narda Amber, DO    Nerve Conduction Studies Anti Sensory Summary Table   Stim Site NR Peak (ms) Norm Peak (ms) P-T Amp (V) Norm P-T Amp  Left Sup Peroneal Anti Sensory (Ant Lat Mall)  33C  12 cm NR  <4.6  >3  Right Sup Peroneal Anti Sensory (Ant Lat Mall)  33C  12 cm NR  <4.6  >3  Left Sural Anti Sensory (Lat Mall)  33C  Calf NR  <4.6  >3  Right Sural Anti Sensory (Lat Mall)  33C  Calf NR  <4.6  >3   Motor Summary Table   Stim Site NR Onset (ms) Norm Onset (ms) O-P Amp (mV) Norm O-P Amp Site1 Site2 Delta-0 (ms) Dist (cm) Vel (m/s) Norm Vel (m/s)  Left Peroneal Motor (Ext Dig Brev)  33C  Ankle NR  <6.0  >2.5 B Fib Ankle  0.0  >40  B Fib NR     Poplt B Fib  0.0  >40  Poplt NR            Right Peroneal  Motor (Ext Dig Brev)  33C  Ankle NR  <6.0  >2.5 B Fib Ankle  0.0  >40  B Fib NR     Poplt B Fib  0.0  >40  Poplt NR            Left Peroneal TA Motor (Tib Ant)  33C  Fib Head    3.1 <4.5 4.3 >3 Poplit Fib Head 1.4 8.0 57 >40  Poplit    4.5  4.1         Right Peroneal TA Motor (Tib Ant)  33C  Fib Head    2.6 <4.5 4.7 >3 Poplit Fib Head 1.4 7.0 50 >40  Poplit    4.0  4.4         Left Tibial Motor (Abd Hall Brev)  33C  Ankle    4.1 <6.0 4.8 >4 Knee Ankle 9.2 43.0 47 >40  Knee    13.3  4.1         Right Tibial Motor (Abd Hall Brev)  33C  Ankle    4.0 <6.0 5.3 >4 Knee Ankle 10.1 42.0 42 >40  Knee    14.1  4.2          H Reflex Studies   NR H-Lat (ms) Lat Norm (ms) L-R H-Lat (ms)  Left Tibial (Gastroc)  33C     36.05 <35 1.22  Right  Tibial (Gastroc)  33C     37.28 <35 1.22   EMG   Side Muscle Ins Act Fibs Psw Fasc Number Recrt Dur Dur. Amp Amp. Poly Poly. Comment  Right AntTibialis Nml Nml Nml Nml Nml Nml Nml Nml Nml Nml Nml Nml N/A  Right GluteusMed Nml Nml Nml Nml Nml Nml Nml Nml Nml Nml Nml Nml N/A  Right Gastroc Nml Nml Nml Nml Nml Nml Nml Nml Nml Nml Nml Nml N/A  Right RectFemoris Nml Nml Nml Nml Nml Nml Nml Nml Nml Nml Nml Nml N/A  Right Flex Dig Long Nml Nml Nml Nml 1- Rapid Some 1+ Some 1+ Some 1+ N/A  Left AntTibialis Nml Nml Nml Nml Nml Nml Nml Nml Nml Nml Nml Nml N/A  Left Gastroc Nml Nml Nml Nml Nml Nml Nml Nml Nml Nml Nml Nml N/A  Left GluteusMed Nml Nml Nml Nml Nml Nml Nml Nml Nml Nml Nml Nml N/A  Left RectFemoris Nml Nml Nml Nml Nml Nml Nml Nml Nml Nml Nml Nml N/A  Left Flex Dig Long Nml Nml Nml Nml 1- Rapid Some 1+ Some 1+ Some 1+ N/A      Waveforms:

## 2021-05-17 DIAGNOSIS — M25562 Pain in left knee: Secondary | ICD-10-CM | POA: Diagnosis not present

## 2021-05-26 DIAGNOSIS — M0579 Rheumatoid arthritis with rheumatoid factor of multiple sites without organ or systems involvement: Secondary | ICD-10-CM | POA: Diagnosis not present

## 2021-05-30 DIAGNOSIS — E785 Hyperlipidemia, unspecified: Secondary | ICD-10-CM | POA: Diagnosis not present

## 2021-05-30 DIAGNOSIS — I10 Essential (primary) hypertension: Secondary | ICD-10-CM | POA: Diagnosis not present

## 2021-05-30 DIAGNOSIS — I700 Atherosclerosis of aorta: Secondary | ICD-10-CM | POA: Diagnosis not present

## 2021-05-30 DIAGNOSIS — M810 Age-related osteoporosis without current pathological fracture: Secondary | ICD-10-CM | POA: Diagnosis not present

## 2021-06-08 ENCOUNTER — Telehealth: Payer: Self-pay | Admitting: Internal Medicine

## 2021-06-08 DIAGNOSIS — I10 Essential (primary) hypertension: Secondary | ICD-10-CM

## 2021-06-08 MED ORDER — BISOPROLOL FUMARATE 5 MG PO TABS: ORAL_TABLET | ORAL | 5 refills | Status: DC

## 2021-06-08 MED ORDER — BREZTRI AEROSPHERE 160-9-4.8 MCG/ACT IN AERO: 2.0000 | INHALATION_SPRAY | Freq: Two times a day (BID) | RESPIRATORY_TRACT | 5 refills | Status: DC

## 2021-06-08 NOTE — Telephone Encounter (Signed)
I have called the pt and she is aware of rx that have been [facility].   Nothing further is needed.

## 2021-06-13 DIAGNOSIS — Z79899 Other long term (current) drug therapy: Secondary | ICD-10-CM | POA: Diagnosis not present

## 2021-06-13 DIAGNOSIS — H2513 Age-related nuclear cataract, bilateral: Secondary | ICD-10-CM | POA: Diagnosis not present

## 2021-06-27 DIAGNOSIS — M0579 Rheumatoid arthritis with rheumatoid factor of multiple sites without organ or systems involvement: Secondary | ICD-10-CM | POA: Diagnosis not present

## 2021-06-29 DIAGNOSIS — I700 Atherosclerosis of aorta: Secondary | ICD-10-CM | POA: Diagnosis not present

## 2021-06-29 DIAGNOSIS — E785 Hyperlipidemia, unspecified: Secondary | ICD-10-CM | POA: Diagnosis not present

## 2021-06-29 DIAGNOSIS — M810 Age-related osteoporosis without current pathological fracture: Secondary | ICD-10-CM | POA: Diagnosis not present

## 2021-06-29 DIAGNOSIS — I10 Essential (primary) hypertension: Secondary | ICD-10-CM | POA: Diagnosis not present

## 2021-07-14 DIAGNOSIS — Z23 Encounter for immunization: Secondary | ICD-10-CM | POA: Diagnosis not present

## 2021-07-20 DIAGNOSIS — M797 Fibromyalgia: Secondary | ICD-10-CM | POA: Diagnosis not present

## 2021-07-20 DIAGNOSIS — M0579 Rheumatoid arthritis with rheumatoid factor of multiple sites without organ or systems involvement: Secondary | ICD-10-CM | POA: Diagnosis not present

## 2021-07-20 DIAGNOSIS — Z79899 Other long term (current) drug therapy: Secondary | ICD-10-CM | POA: Diagnosis not present

## 2021-07-20 DIAGNOSIS — G5600 Carpal tunnel syndrome, unspecified upper limb: Secondary | ICD-10-CM | POA: Diagnosis not present

## 2021-07-20 DIAGNOSIS — R768 Other specified abnormal immunological findings in serum: Secondary | ICD-10-CM | POA: Diagnosis not present

## 2021-07-20 DIAGNOSIS — M1990 Unspecified osteoarthritis, unspecified site: Secondary | ICD-10-CM | POA: Diagnosis not present

## 2021-07-20 DIAGNOSIS — R202 Paresthesia of skin: Secondary | ICD-10-CM | POA: Diagnosis not present

## 2021-07-20 DIAGNOSIS — N1831 Chronic kidney disease, stage 3a: Secondary | ICD-10-CM | POA: Diagnosis not present

## 2021-07-20 DIAGNOSIS — M25462 Effusion, left knee: Secondary | ICD-10-CM | POA: Diagnosis not present

## 2021-07-20 DIAGNOSIS — M25562 Pain in left knee: Secondary | ICD-10-CM | POA: Diagnosis not present

## 2021-07-26 DIAGNOSIS — M0579 Rheumatoid arthritis with rheumatoid factor of multiple sites without organ or systems involvement: Secondary | ICD-10-CM | POA: Diagnosis not present

## 2021-08-02 DIAGNOSIS — K136 Irritative hyperplasia of oral mucosa: Secondary | ICD-10-CM | POA: Diagnosis not present

## 2021-08-05 DIAGNOSIS — M25562 Pain in left knee: Secondary | ICD-10-CM | POA: Insufficient documentation

## 2021-08-05 DIAGNOSIS — M25552 Pain in left hip: Secondary | ICD-10-CM | POA: Insufficient documentation

## 2021-08-08 DIAGNOSIS — M25552 Pain in left hip: Secondary | ICD-10-CM | POA: Diagnosis not present

## 2021-08-08 DIAGNOSIS — M25562 Pain in left knee: Secondary | ICD-10-CM | POA: Diagnosis not present

## 2021-08-23 DIAGNOSIS — M0579 Rheumatoid arthritis with rheumatoid factor of multiple sites without organ or systems involvement: Secondary | ICD-10-CM | POA: Diagnosis not present

## 2021-08-24 DIAGNOSIS — H16042 Marginal corneal ulcer, left eye: Secondary | ICD-10-CM | POA: Diagnosis not present

## 2021-08-26 DIAGNOSIS — K219 Gastro-esophageal reflux disease without esophagitis: Secondary | ICD-10-CM | POA: Diagnosis not present

## 2021-08-26 DIAGNOSIS — D84821 Immunodeficiency due to drugs: Secondary | ICD-10-CM | POA: Diagnosis not present

## 2021-08-26 DIAGNOSIS — M0579 Rheumatoid arthritis with rheumatoid factor of multiple sites without organ or systems involvement: Secondary | ICD-10-CM | POA: Diagnosis not present

## 2021-08-26 DIAGNOSIS — M810 Age-related osteoporosis without current pathological fracture: Secondary | ICD-10-CM | POA: Diagnosis not present

## 2021-08-26 DIAGNOSIS — Z0000 Encounter for general adult medical examination without abnormal findings: Secondary | ICD-10-CM | POA: Diagnosis not present

## 2021-08-26 DIAGNOSIS — H16042 Marginal corneal ulcer, left eye: Secondary | ICD-10-CM | POA: Diagnosis not present

## 2021-08-26 DIAGNOSIS — I700 Atherosclerosis of aorta: Secondary | ICD-10-CM | POA: Diagnosis not present

## 2021-08-26 DIAGNOSIS — I6523 Occlusion and stenosis of bilateral carotid arteries: Secondary | ICD-10-CM | POA: Diagnosis not present

## 2021-08-26 DIAGNOSIS — J449 Chronic obstructive pulmonary disease, unspecified: Secondary | ICD-10-CM | POA: Diagnosis not present

## 2021-08-26 DIAGNOSIS — N1832 Chronic kidney disease, stage 3b: Secondary | ICD-10-CM | POA: Diagnosis not present

## 2021-08-26 DIAGNOSIS — F321 Major depressive disorder, single episode, moderate: Secondary | ICD-10-CM | POA: Diagnosis not present

## 2021-08-26 DIAGNOSIS — G629 Polyneuropathy, unspecified: Secondary | ICD-10-CM | POA: Diagnosis not present

## 2021-08-26 DIAGNOSIS — I10 Essential (primary) hypertension: Secondary | ICD-10-CM | POA: Diagnosis not present

## 2021-08-29 DIAGNOSIS — R7309 Other abnormal glucose: Secondary | ICD-10-CM | POA: Diagnosis not present

## 2021-08-29 DIAGNOSIS — E785 Hyperlipidemia, unspecified: Secondary | ICD-10-CM | POA: Diagnosis not present

## 2021-08-29 DIAGNOSIS — I129 Hypertensive chronic kidney disease with stage 1 through stage 4 chronic kidney disease, or unspecified chronic kidney disease: Secondary | ICD-10-CM | POA: Diagnosis not present

## 2021-08-30 DIAGNOSIS — G5600 Carpal tunnel syndrome, unspecified upper limb: Secondary | ICD-10-CM | POA: Diagnosis not present

## 2021-08-30 DIAGNOSIS — Z79899 Other long term (current) drug therapy: Secondary | ICD-10-CM | POA: Diagnosis not present

## 2021-08-30 DIAGNOSIS — N1831 Chronic kidney disease, stage 3a: Secondary | ICD-10-CM | POA: Diagnosis not present

## 2021-08-30 DIAGNOSIS — M1990 Unspecified osteoarthritis, unspecified site: Secondary | ICD-10-CM | POA: Diagnosis not present

## 2021-08-30 DIAGNOSIS — R768 Other specified abnormal immunological findings in serum: Secondary | ICD-10-CM | POA: Diagnosis not present

## 2021-08-30 DIAGNOSIS — M797 Fibromyalgia: Secondary | ICD-10-CM | POA: Diagnosis not present

## 2021-08-30 DIAGNOSIS — R202 Paresthesia of skin: Secondary | ICD-10-CM | POA: Diagnosis not present

## 2021-08-30 DIAGNOSIS — M0579 Rheumatoid arthritis with rheumatoid factor of multiple sites without organ or systems involvement: Secondary | ICD-10-CM | POA: Diagnosis not present

## 2021-08-31 DIAGNOSIS — M25552 Pain in left hip: Secondary | ICD-10-CM | POA: Diagnosis not present

## 2021-09-19 ENCOUNTER — Other Ambulatory Visit: Payer: Self-pay

## 2021-09-19 DIAGNOSIS — I10 Essential (primary) hypertension: Secondary | ICD-10-CM

## 2021-09-20 DIAGNOSIS — M0579 Rheumatoid arthritis with rheumatoid factor of multiple sites without organ or systems involvement: Secondary | ICD-10-CM | POA: Diagnosis not present

## 2021-09-23 ENCOUNTER — Other Ambulatory Visit: Payer: Self-pay

## 2021-09-23 ENCOUNTER — Other Ambulatory Visit: Payer: Self-pay | Admitting: Internal Medicine

## 2021-09-23 DIAGNOSIS — I10 Essential (primary) hypertension: Secondary | ICD-10-CM

## 2021-09-23 MED ORDER — BISOPROLOL FUMARATE 5 MG PO TABS: ORAL_TABLET | ORAL | 5 refills | Status: DC

## 2021-09-23 MED ORDER — BREZTRI AEROSPHERE 160-9-4.8 MCG/ACT IN AERO: 2.0000 | INHALATION_SPRAY | Freq: Two times a day (BID) | RESPIRATORY_TRACT | 5 refills | Status: DC

## 2021-09-29 DIAGNOSIS — Z86010 Personal history of colonic polyps: Secondary | ICD-10-CM | POA: Diagnosis not present

## 2021-09-29 DIAGNOSIS — K648 Other hemorrhoids: Secondary | ICD-10-CM | POA: Diagnosis not present

## 2021-09-29 DIAGNOSIS — K625 Hemorrhage of anus and rectum: Secondary | ICD-10-CM | POA: Diagnosis not present

## 2021-10-18 DIAGNOSIS — M0579 Rheumatoid arthritis with rheumatoid factor of multiple sites without organ or systems involvement: Secondary | ICD-10-CM | POA: Diagnosis not present

## 2021-10-25 DIAGNOSIS — M9901 Segmental and somatic dysfunction of cervical region: Secondary | ICD-10-CM | POA: Diagnosis not present

## 2021-10-25 DIAGNOSIS — M50322 Other cervical disc degeneration at C5-C6 level: Secondary | ICD-10-CM | POA: Diagnosis not present

## 2021-10-27 DIAGNOSIS — M9901 Segmental and somatic dysfunction of cervical region: Secondary | ICD-10-CM | POA: Diagnosis not present

## 2021-10-27 DIAGNOSIS — M50322 Other cervical disc degeneration at C5-C6 level: Secondary | ICD-10-CM | POA: Diagnosis not present

## 2021-11-02 DIAGNOSIS — M50322 Other cervical disc degeneration at C5-C6 level: Secondary | ICD-10-CM | POA: Diagnosis not present

## 2021-11-02 DIAGNOSIS — M9901 Segmental and somatic dysfunction of cervical region: Secondary | ICD-10-CM | POA: Diagnosis not present

## 2021-11-09 DIAGNOSIS — M9901 Segmental and somatic dysfunction of cervical region: Secondary | ICD-10-CM | POA: Diagnosis not present

## 2021-11-09 DIAGNOSIS — M50322 Other cervical disc degeneration at C5-C6 level: Secondary | ICD-10-CM | POA: Diagnosis not present

## 2021-11-15 DIAGNOSIS — M0579 Rheumatoid arthritis with rheumatoid factor of multiple sites without organ or systems involvement: Secondary | ICD-10-CM | POA: Diagnosis not present

## 2021-11-24 DIAGNOSIS — R768 Other specified abnormal immunological findings in serum: Secondary | ICD-10-CM | POA: Diagnosis not present

## 2021-11-24 DIAGNOSIS — N1831 Chronic kidney disease, stage 3a: Secondary | ICD-10-CM | POA: Diagnosis not present

## 2021-11-24 DIAGNOSIS — M0579 Rheumatoid arthritis with rheumatoid factor of multiple sites without organ or systems involvement: Secondary | ICD-10-CM | POA: Diagnosis not present

## 2021-11-24 DIAGNOSIS — G5600 Carpal tunnel syndrome, unspecified upper limb: Secondary | ICD-10-CM | POA: Diagnosis not present

## 2021-11-24 DIAGNOSIS — M797 Fibromyalgia: Secondary | ICD-10-CM | POA: Diagnosis not present

## 2021-11-24 DIAGNOSIS — R202 Paresthesia of skin: Secondary | ICD-10-CM | POA: Diagnosis not present

## 2021-11-24 DIAGNOSIS — M1990 Unspecified osteoarthritis, unspecified site: Secondary | ICD-10-CM | POA: Diagnosis not present

## 2021-11-24 DIAGNOSIS — Z79899 Other long term (current) drug therapy: Secondary | ICD-10-CM | POA: Diagnosis not present

## 2021-12-01 DIAGNOSIS — I129 Hypertensive chronic kidney disease with stage 1 through stage 4 chronic kidney disease, or unspecified chronic kidney disease: Secondary | ICD-10-CM | POA: Diagnosis not present

## 2021-12-01 DIAGNOSIS — M069 Rheumatoid arthritis, unspecified: Secondary | ICD-10-CM | POA: Diagnosis not present

## 2021-12-01 DIAGNOSIS — N1832 Chronic kidney disease, stage 3b: Secondary | ICD-10-CM | POA: Diagnosis not present

## 2021-12-01 DIAGNOSIS — E673 Hypervitaminosis D: Secondary | ICD-10-CM | POA: Diagnosis not present

## 2021-12-06 DIAGNOSIS — M0579 Rheumatoid arthritis with rheumatoid factor of multiple sites without organ or systems involvement: Secondary | ICD-10-CM | POA: Diagnosis not present

## 2021-12-06 DIAGNOSIS — M797 Fibromyalgia: Secondary | ICD-10-CM | POA: Diagnosis not present

## 2021-12-06 DIAGNOSIS — M1990 Unspecified osteoarthritis, unspecified site: Secondary | ICD-10-CM | POA: Diagnosis not present

## 2021-12-06 DIAGNOSIS — R768 Other specified abnormal immunological findings in serum: Secondary | ICD-10-CM | POA: Diagnosis not present

## 2021-12-06 DIAGNOSIS — Z79899 Other long term (current) drug therapy: Secondary | ICD-10-CM | POA: Diagnosis not present

## 2021-12-06 DIAGNOSIS — N1831 Chronic kidney disease, stage 3a: Secondary | ICD-10-CM | POA: Diagnosis not present

## 2021-12-06 DIAGNOSIS — G5600 Carpal tunnel syndrome, unspecified upper limb: Secondary | ICD-10-CM | POA: Diagnosis not present

## 2021-12-06 DIAGNOSIS — M25562 Pain in left knee: Secondary | ICD-10-CM | POA: Diagnosis not present

## 2021-12-13 DIAGNOSIS — M0579 Rheumatoid arthritis with rheumatoid factor of multiple sites without organ or systems involvement: Secondary | ICD-10-CM | POA: Diagnosis not present

## 2021-12-20 ENCOUNTER — Ambulatory Visit (INDEPENDENT_AMBULATORY_CARE_PROVIDER_SITE_OTHER): Payer: Medicare Other | Admitting: Internal Medicine

## 2021-12-20 ENCOUNTER — Ambulatory Visit (INDEPENDENT_AMBULATORY_CARE_PROVIDER_SITE_OTHER): Payer: Medicare Other

## 2021-12-20 ENCOUNTER — Encounter: Payer: Self-pay | Admitting: Internal Medicine

## 2021-12-20 DIAGNOSIS — M069 Rheumatoid arthritis, unspecified: Secondary | ICD-10-CM | POA: Diagnosis not present

## 2021-12-20 DIAGNOSIS — J449 Chronic obstructive pulmonary disease, unspecified: Secondary | ICD-10-CM

## 2021-12-20 DIAGNOSIS — R059 Cough, unspecified: Secondary | ICD-10-CM | POA: Diagnosis not present

## 2021-12-20 MED ORDER — PREDNISONE 10 MG PO TABS: ORAL_TABLET | ORAL | 0 refills | Status: DC

## 2021-12-20 MED ORDER — AZITHROMYCIN 250 MG PO TABS: ORAL_TABLET | ORAL | 0 refills | Status: DC

## 2021-12-20 NOTE — Progress Notes (Unsigned)
Donna [name], female    DOB: 11/24/1945,     MRN: [id number]   Brief patient profile:  63   yowf with RA quit smoking 2011/MM  with eval by K Clance GOLD 2 no change with spiriva so d/c'd just used ventolin sporadically but around summer 2020 moved to [facility] which is normally just summer vacation spot and there experenced cough/wheeze / sob rx more albuterol up to twice daily so self referred back to pulmonary clinic 12/25/2019    History of Present Illness  12/25/2019  Pulmonary/ 1st office eval/Tai Syfert / s/p 2nd [place]  Patient presents with   Consult    pt states uses rescue inhaler everyday. pt states when doing activities sob  Dyspnea:  MMRC2 = can't walk a nl pace on a flat grade s sob but does fine slow and flat  Cough: white phelgm worse in am  Sleep: flat, sleeps two pillows  SABA use: twice daily  Also has omeprazole but not using ac rec Plan A = Automatic = Always=    Breztri Take 2 puffs first thing in am and then another 2 puffs about 12 hours later.  Work on inhaler technique: Plan B = Backup (to supplement plan A, not to replace it) Only use your albuterol inhaler as a rescue medication  Omeprazole Take 30-60 min before first meal of the day  Stop fosfamax until return Stop metaprolol and start Bisoprolol 5 mg one daily (won't interfere with albuterol)       12/20/2020  f/u ov/Kilee Hedding re: copd GOLD II/ RA Chief Complaint  Patient presents with   Follow-up    DOE  Dyspnea:  [place] to car then flat walking x 15 min flat surface around neighborhood Cough: none  Sleeping: flat on one pillow SABA use: none  02: none  Covid status:   Vaccine x 2 / omicron 08/2020 Rec No change rx   12/20/2021  f/u ov/Tyee Vandevoorde re: COPD GOLD 2 /RA   maint on breztri 2bid   Chief Complaint  Patient presents with   Follow-up    Cough with clear to white mucous.   Dyspnea:  no change = MMRC2 = can't walk a nl pace on a flat grade s sob but does fine slow and flat  Cough:  clear mucus 24/7 x 3 weeks/ always has runny nose years stopped atrovent 3 days into ? [place] and much worse since then  Sleeping: not aware of cough at hs  SABA use: not using  02: none    No obvious day to day or daytime variability or assoc excess/ purulent sputum or mucus plugs or hemoptysis or cp or chest tightness, subjective wheeze or overt sinus or hb symptoms.   Sleeping  without nocturnal  or early am exacerbation  of respiratory  c/o's or need for noct saba. Also denies any obvious fluctuation of symptoms with weather or environmental changes or other aggravating or alleviating factors except as outlined above   No unusual exposure hx or h/o childhood pna/ asthma or knowledge of premature birth.  Current Allergies, Complete Past Medical History, Past Surgical History, Family History, and Social History were reviewed in [employer] record.  ROS  The following are not active complaints unless bolded Hoarseness, sore throat, dysphagia, dental problems, itching, sneezing,  nasal congestion or discharge of excess mucus or purulent secretions, ear ache,   fever, chills, sweats, unintended wt loss or wt gain, classically pleuritic or exertional cp,  orthopnea pnd or arm/hand swelling  or leg swelling, presyncope, palpitations, abdominal pain, anorexia, nausea, vomiting, diarrhea  or change in bowel habits or change in bladder habits, change in stools or change in urine, dysuria, hematuria,  rash, arthralgias, visual complaints, headache, numbness, weakness or ataxia or problems with walking or coordination,  change in mood or  memory.        Current Meds  Medication Sig   Abatacept (ORENCIA [place]) Infusion every 30 days per Dr Kathlene November   albuterol (PROVENTIL HFA;VENTOLIN HFA) 108 (90 Base) MCG/ACT inhaler Inhale 2 puffs into the lungs every 4 (four) hours as needed for wheezing or shortness of breath.   bisoprolol (ZEBETA) 5 MG tablet TAKE 1 TABLET(5 MG) BY MOUTH DAILY    Budeson-Glycopyrrol-Formoterol (BREZTRI AEROSPHERE) 160-9-4.8 MCG/ACT AERO Inhale 2 puffs into the lungs in the morning and at bedtime.   Coenzyme Q10 (COQ-10) 100 MG CAPS Take 1 capsule by mouth daily.   DULoxetine (CYMBALTA) 20 MG capsule Take 20 mg by mouth daily.   hydroxychloroquine (PLAQUENIL) 200 MG tablet Take 200 mg by mouth daily.   leflunomide (ARAVA) 20 MG tablet Take 20 mg by mouth daily.   Multiple Vitamins-Minerals (MULTIVITAMIN WOMEN) TABS Take 1 tablet by mouth daily.   triamterene-hydrochlorothiazide (DYAZIDE) 37.5-25 MG capsule Take 1 capsule by mouth daily.   Turmeric Curcumin 500 MG CAPS Take 1,000 mg by mouth daily.         Past Medical History:  Diagnosis Date   Arthritis    lumbar, toes, L knee, cerv. spine & hands    Cancer ([place])    basal cell removed fr. face    COPD (chronic obstructive pulmonary disease) (HCC)    GERD (gastroesophageal reflux disease)    H/O echocardiogram      Objective:    Wts  12/20/2021       159  12/20/2020       154 10/26/2020       146   02/13/20 161 lb 3.2 oz (73.1 kg)  12/25/19 172 lb (78 kg)  10/01/15 180 lb 6.4 oz (81.8 kg)      Vital signs reviewed  12/20/2021  - Note at rest 02 sats  95% on RA    General appearance:    pleasant amb wf nad / min congested sounding cough    HEENT : Oropharynx  watery pnd   Nasal turbintes mod edema    NECK :  without  appent JVD/ palpable Nodes/TM    LUNGS: no acc muscle use,  Min barrel  contour chest wall with bilateral  slightly decreased bs s audible wheeze and  without cough on insp or exp maneuvers and min  Hyperresonant  to  percussion bilaterally    CV:  RRR  no s3 or murmur or increase in P2, and no edema   ABD:  soft and nontender with pos end  insp Hoover's  in the supine position.  No bruits or organomegaly appreciated   MS:  Nl gait/ ext warm without deformities Or obvious joint restrictions  calf tenderness, cyanosis or clubbing    SKIN: warm and dry without lesions     NEURO:  alert, approp, nl sensorium with  no motor or cerebellar deficits apparent.           CXR PA and Lateral:   12/20/2021 :    I personally reviewed images and agree with radiology impression as follows:   Mild opacity in the medial right lung base is  somewhat rounded and nodular in appearance. Confluence of shadows is favored. However, subtle early developing opacity is not excluded.        Assessment

## 2021-12-20 NOTE — Patient Instructions (Addendum)
Zpak   Resume your prior atrovent nasal spray   Prednisone 10 mg take  4 each am x 2 days,   2 each am x 2 days,  1 each am x 2 days and stop    For drainage / throat tickle try take CHLORPHENIRAMINE  4 mg  ("Allergy Relief" '4mg'$   at [facility] should be easiest to find in the blue box usually on bottom shelf)  take one every 4 hours as needed - extremely effective and inexpensive over the counter- may cause drowsiness so start with just a dose or two an hour before bedtime and see how you tolerate it before trying in daytime.   Please remember to go to the  x-ray department  for your tests - we will call you with the results when they are available    Please schedule a follow up visit in 12  months but call sooner if needed

## 2021-12-21 ENCOUNTER — Other Ambulatory Visit: Payer: Self-pay | Admitting: Internal Medicine

## 2021-12-21 ENCOUNTER — Encounter: Payer: Self-pay | Admitting: Internal Medicine

## 2021-12-21 DIAGNOSIS — R0609 Other forms of dyspnea: Secondary | ICD-10-CM

## 2021-12-21 DIAGNOSIS — M069 Rheumatoid arthritis, unspecified: Secondary | ICD-10-CM | POA: Insufficient documentation

## 2021-12-21 NOTE — Assessment & Plan Note (Signed)
cxr  12/20/2021 ? Nodular ILD >  rec HRCT next   Discussed in detail all the  indications, usual  risks and alternatives  relative to the benefits with patient who agrees to proceed with w/u as outlined.            Each maintenance medication was reviewed in detail including emphasizing most importantly the difference between maintenance and prns and under what circumstances the prns are to be triggered using an action plan format where appropriate.  Total time for H and P, chart review, counseling, reviewing hfa device(s) and generating customized AVS unique to this office visit / same day charting = 31 min

## 2021-12-21 NOTE — Progress Notes (Signed)
Spoke with pt and notified of results per Dr. Melvyn Novas. Pt agreeable to HRCT and this was ordered. She is asking what about the cxr is worse and how this is related to her RA. Dr Melvyn Novas- please advise, thanks!

## 2021-12-21 NOTE — Assessment & Plan Note (Addendum)
Quit smoking in 2011  PFT's 2012: FEV1 1.77 (74%), ratio 55, +airtrapping, TLC normal, DLCO 55% - 12/25/2019  After extensive coaching inhaler device,  effectiveness =    50% > breztri Take 2 puffs first thing in am and then another 2 puffs about 12 hours later.  - 02/13/2020  After extensive coaching inhaler device,  effectiveness =    90%  - [facility] screen  02/13/20 :   Level 185  MM - PFT's  12/20/2020  FEV1 1.82 (1 % ) ratio 0.63  p 1 % improvement from saba p breztri prior to study with DLCO  10.65 (52%) corrects to 2.03 (49%)  for alv volume and FV curve mild concavity    Group D (now reclassified as E) in terms of symptom/risk and laba/lama/ICS  therefore appropriate rx at this point >>>  breztri approp maint rx    Mild flare of cough  in setting of uri/ increased rhinitis with baseline vasomotor issues previously better on atrovent  rec zpak  Prednisone 10 mg take  4 each am x 2 days,   2 each am x 2 days,  1 each am x 2 days and stop 1st gen H1 blockers per guidelines   Resume atrovent and titrate to desired level of control acutely and chronically  No change maint rx (breztri and approp saba)

## 2021-12-23 NOTE — Progress Notes (Signed)
Spoke with pt and notified of results per Dr. [name]. Pt verbalized understanding and denied any questions. 

## 2022-01-10 DIAGNOSIS — M0579 Rheumatoid arthritis with rheumatoid factor of multiple sites without organ or systems involvement: Secondary | ICD-10-CM | POA: Diagnosis not present

## 2022-01-16 DIAGNOSIS — D123 Benign neoplasm of transverse colon: Secondary | ICD-10-CM | POA: Diagnosis not present

## 2022-01-16 DIAGNOSIS — Z86010 Personal history of colonic polyps: Secondary | ICD-10-CM | POA: Diagnosis not present

## 2022-01-16 DIAGNOSIS — K625 Hemorrhage of anus and rectum: Secondary | ICD-10-CM | POA: Diagnosis not present

## 2022-01-16 DIAGNOSIS — K648 Other hemorrhoids: Secondary | ICD-10-CM | POA: Diagnosis not present

## 2022-01-16 DIAGNOSIS — D128 Benign neoplasm of rectum: Secondary | ICD-10-CM | POA: Diagnosis not present

## 2022-01-16 DIAGNOSIS — D122 Benign neoplasm of ascending colon: Secondary | ICD-10-CM | POA: Diagnosis not present

## 2022-01-18 DIAGNOSIS — D128 Benign neoplasm of rectum: Secondary | ICD-10-CM | POA: Diagnosis not present

## 2022-01-19 ENCOUNTER — Other Ambulatory Visit: Payer: Self-pay | Admitting: Internal Medicine

## 2022-01-23 DIAGNOSIS — M25552 Pain in left hip: Secondary | ICD-10-CM | POA: Diagnosis not present

## 2022-01-23 DIAGNOSIS — M25562 Pain in left knee: Secondary | ICD-10-CM | POA: Diagnosis not present

## 2022-02-14 DIAGNOSIS — M0579 Rheumatoid arthritis with rheumatoid factor of multiple sites without organ or systems involvement: Secondary | ICD-10-CM | POA: Diagnosis not present

## 2022-02-28 DIAGNOSIS — M1990 Unspecified osteoarthritis, unspecified site: Secondary | ICD-10-CM | POA: Diagnosis not present

## 2022-02-28 DIAGNOSIS — M7582 Other shoulder lesions, left shoulder: Secondary | ICD-10-CM | POA: Diagnosis not present

## 2022-02-28 DIAGNOSIS — N1831 Chronic kidney disease, stage 3a: Secondary | ICD-10-CM | POA: Diagnosis not present

## 2022-02-28 DIAGNOSIS — Z79899 Other long term (current) drug therapy: Secondary | ICD-10-CM | POA: Diagnosis not present

## 2022-02-28 DIAGNOSIS — M25512 Pain in left shoulder: Secondary | ICD-10-CM | POA: Diagnosis not present

## 2022-02-28 DIAGNOSIS — R768 Other specified abnormal immunological findings in serum: Secondary | ICD-10-CM | POA: Diagnosis not present

## 2022-02-28 DIAGNOSIS — M79643 Pain in unspecified hand: Secondary | ICD-10-CM | POA: Diagnosis not present

## 2022-02-28 DIAGNOSIS — M797 Fibromyalgia: Secondary | ICD-10-CM | POA: Diagnosis not present

## 2022-02-28 DIAGNOSIS — M7989 Other specified soft tissue disorders: Secondary | ICD-10-CM | POA: Diagnosis not present

## 2022-02-28 DIAGNOSIS — M0579 Rheumatoid arthritis with rheumatoid factor of multiple sites without organ or systems involvement: Secondary | ICD-10-CM | POA: Diagnosis not present

## 2022-02-28 DIAGNOSIS — G5600 Carpal tunnel syndrome, unspecified upper limb: Secondary | ICD-10-CM | POA: Diagnosis not present

## 2022-03-02 ENCOUNTER — Ambulatory Visit
Admission: RE | Admit: 2022-03-02 | Discharge: 2022-03-02 | Disposition: A | Discharge status: Home / Self Care | Payer: Medicare Other | Source: Ambulatory Visit | Attending: Internal Medicine | Admitting: Internal Medicine

## 2022-03-02 DIAGNOSIS — R0609 Other forms of dyspnea: Secondary | ICD-10-CM

## 2022-03-02 DIAGNOSIS — K449 Diaphragmatic hernia without obstruction or gangrene: Secondary | ICD-10-CM | POA: Diagnosis not present

## 2022-03-02 DIAGNOSIS — J432 Centrilobular emphysema: Secondary | ICD-10-CM | POA: Diagnosis not present

## 2022-03-02 DIAGNOSIS — R918 Other nonspecific abnormal finding of lung field: Secondary | ICD-10-CM | POA: Diagnosis not present

## 2022-03-02 DIAGNOSIS — I700 Atherosclerosis of aorta: Secondary | ICD-10-CM | POA: Diagnosis not present

## 2022-03-03 ENCOUNTER — Other Ambulatory Visit: Payer: Self-pay | Admitting: Internal Medicine

## 2022-03-03 DIAGNOSIS — R911 Solitary pulmonary nodule: Secondary | ICD-10-CM

## 2022-03-03 NOTE — Progress Notes (Signed)
Spoke with pt and notified of results per Dr. Melvyn Novas. Pt verbalized understanding and denied any questions. She was agreeable to Chest CT and went ahead and ordered to be done in 6 months.

## 2022-03-14 DIAGNOSIS — L03031 Cellulitis of right toe: Secondary | ICD-10-CM | POA: Diagnosis not present

## 2022-03-16 DIAGNOSIS — M0579 Rheumatoid arthritis with rheumatoid factor of multiple sites without organ or systems involvement: Secondary | ICD-10-CM | POA: Diagnosis not present

## 2022-03-16 DIAGNOSIS — L03031 Cellulitis of right toe: Secondary | ICD-10-CM | POA: Diagnosis not present

## 2022-03-20 DIAGNOSIS — M0579 Rheumatoid arthritis with rheumatoid factor of multiple sites without organ or systems involvement: Secondary | ICD-10-CM | POA: Diagnosis not present

## 2022-03-20 DIAGNOSIS — L03031 Cellulitis of right toe: Secondary | ICD-10-CM | POA: Diagnosis not present

## 2022-03-24 DIAGNOSIS — M0579 Rheumatoid arthritis with rheumatoid factor of multiple sites without organ or systems involvement: Secondary | ICD-10-CM | POA: Diagnosis not present

## 2022-03-27 DIAGNOSIS — M5459 Other low back pain: Secondary | ICD-10-CM | POA: Diagnosis not present

## 2022-04-04 DIAGNOSIS — M5459 Other low back pain: Secondary | ICD-10-CM | POA: Diagnosis not present

## 2022-04-04 DIAGNOSIS — M5416 Radiculopathy, lumbar region: Secondary | ICD-10-CM | POA: Diagnosis not present

## 2022-04-11 DIAGNOSIS — M0579 Rheumatoid arthritis with rheumatoid factor of multiple sites without organ or systems involvement: Secondary | ICD-10-CM | POA: Diagnosis not present

## 2022-04-11 DIAGNOSIS — M8589 Other specified disorders of bone density and structure, multiple sites: Secondary | ICD-10-CM | POA: Diagnosis not present

## 2022-04-11 DIAGNOSIS — M810 Age-related osteoporosis without current pathological fracture: Secondary | ICD-10-CM | POA: Diagnosis not present

## 2022-04-11 DIAGNOSIS — E785 Hyperlipidemia, unspecified: Secondary | ICD-10-CM | POA: Diagnosis not present

## 2022-04-17 DIAGNOSIS — Z1231 Encounter for screening mammogram for malignant neoplasm of breast: Secondary | ICD-10-CM | POA: Diagnosis not present

## 2022-04-24 DIAGNOSIS — M961 Postlaminectomy syndrome, not elsewhere classified: Secondary | ICD-10-CM | POA: Diagnosis not present

## 2022-04-24 DIAGNOSIS — M5416 Radiculopathy, lumbar region: Secondary | ICD-10-CM | POA: Insufficient documentation

## 2022-04-25 DIAGNOSIS — M0579 Rheumatoid arthritis with rheumatoid factor of multiple sites without organ or systems involvement: Secondary | ICD-10-CM | POA: Diagnosis not present

## 2022-05-02 ENCOUNTER — Other Ambulatory Visit: Payer: Self-pay | Admitting: Internal Medicine

## 2022-05-02 DIAGNOSIS — I10 Essential (primary) hypertension: Secondary | ICD-10-CM

## 2022-05-11 DIAGNOSIS — M5416 Radiculopathy, lumbar region: Secondary | ICD-10-CM | POA: Diagnosis not present

## 2022-05-17 DIAGNOSIS — Z23 Encounter for immunization: Secondary | ICD-10-CM | POA: Diagnosis not present

## 2022-05-18 ENCOUNTER — Ambulatory Visit: Payer: Medicare Other

## 2022-05-18 ENCOUNTER — Other Ambulatory Visit: Payer: Self-pay | Admitting: Internal Medicine

## 2022-05-18 ENCOUNTER — Ambulatory Visit: Payer: Self-pay | Admitting: Orthopedic Surgery

## 2022-05-18 ENCOUNTER — Encounter: Payer: Self-pay | Admitting: Internal Medicine

## 2022-05-18 VITALS — BP 145/78 | HR 65 | Temp 98.0°F | Resp 16 | Ht 66.0 in | Wt 159.0 lb

## 2022-05-18 DIAGNOSIS — R0602 Shortness of breath: Secondary | ICD-10-CM

## 2022-05-18 DIAGNOSIS — I739 Peripheral vascular disease, unspecified: Secondary | ICD-10-CM

## 2022-05-18 DIAGNOSIS — I10 Essential (primary) hypertension: Secondary | ICD-10-CM

## 2022-05-18 DIAGNOSIS — S91109A Unspecified open wound of unspecified toe(s) without damage to nail, initial encounter: Secondary | ICD-10-CM

## 2022-05-18 DIAGNOSIS — I998 Other disorder of circulatory system: Secondary | ICD-10-CM

## 2022-05-18 DIAGNOSIS — I6523 Occlusion and stenosis of bilateral carotid arteries: Secondary | ICD-10-CM | POA: Diagnosis not present

## 2022-05-18 MED ORDER — CLOPIDOGREL BISULFATE 75 MG PO TABS: 75.0000 mg | ORAL_TABLET | Freq: Every day | ORAL | 3 refills | Status: DC

## 2022-05-18 MED ORDER — ASPIRIN 81 MG PO TBEC: 81.0000 mg | DELAYED_RELEASE_TABLET | Freq: Every day | ORAL | 12 refills | Status: DC

## 2022-05-18 MED ORDER — LOSARTAN POTASSIUM 25 MG PO TABS: 25.0000 mg | ORAL_TABLET | Freq: Every day | ORAL | 3 refills | Status: DC

## 2022-05-18 NOTE — H&P (View-Only) (Signed)
Primary Physician/Referring:  Deland Pretty, MD  Patient ID: Donna [name], female    DOB: 30-Sep-1945, 76 y.o.   MRN: [id number]  Chief Complaint  Patient presents with   Hypertension   purple toes   HPI:    Donna [name]  is a 76 y.o. female with past medical history of chronic palpitations dating back 2010, hypertension, hyperlipidemia, osteopenia, GERD, degenerative joint disease of lower back, chronic kidney disease stage II, and prediabetes.  She was previously seen in office by Dr. Einar Gip 3 years ago for bilateral carotid artery stenosis. She has been lost to follow-up until now. She presents today with complaints of discoloration of bilateral toes and feet.  Approximately 3 years ago she was seen by wound clinic in [facility] for nonhealing wound on the right foot and evidence of peripheral vascular disease.  Subsequently she underwent an angiogram however this was aborted due to inability to catheterize the left common femoral artery.  Since then she has experienced ongoing numbness and tingling in bilateral lower extremities, pain in bilateral calves on walking, discoloration of bilateral toes, swelling of right ankle, and slow healing wounds on right foot. She has shortness of breath with exertion and difficulty ambulating long distances.  Three months ago she developed a draining wound on her right fifth toe and was placed on antibiotics. This has since returned.  She denies chest pain, shortness of breath, palpitations, orthopnea, PND, syncope.  Past Medical History:  Diagnosis Date   Arthritis    lumbar, toes, L knee, cerv. spine & hands    Cancer ([place])    basal cell removed fr. face    COPD (chronic obstructive pulmonary disease) (HCC)    GERD (gastroesophageal reflux disease)    H/O echocardiogram    Hiatal hernia    History of hiatal hernia    Hypertension    Hypertension    currently followed for BP / heart management with Dr. Leanora Ivanoff, but prev. saw Dr. Einar Gip    Past Surgical History:  Procedure Laterality Date   BACK SURGERY  1994   cerv. spine fusion    BREAST ENHANCEMENT SURGERY     BREAST SURGERY  1980's   implants- bilateral    CERVICAL SPINE SURGERY  1992   fusion   JOINT REPLACEMENT Right 2007   LUMBAR LAMINECTOMY/DECOMPRESSION MICRODISCECTOMY Left 10/07/2015   Procedure: Lumbar three-five Decompressive lumbar Laminectomy & Left Lumbar four-five Microdiscectomy;  Surgeon: Jovita Gamma, MD;  Location: [facility];  Service: Neurosurgery;  Laterality: Left;   TONSILLECTOMY     TOTAL HIP ARTHROPLASTY  2007   Right   TUBAL LIGATION     Family History  Problem Relation Age of Onset   Heart disease Maternal Aunt    Heart disease Maternal Aunt    Lung cancer Sister    Kidney cancer Mother    Kidney cancer Maternal Uncle    Kidney cancer Maternal Aunt     Social History   Tobacco Use   Smoking status: Former    Packs/day: 0.70    Years: 24.00    Total pack years: 16.80    Types: Cigarettes    Quit date: 07/31/2009    Years since quitting: 12.8   Smokeless tobacco: Former    Quit date: 10/01/1998  Substance Use Topics   Alcohol use: Yes    Comment: wine- almost daily   Marital Status: Married  ROS  Review of Systems  Cardiovascular:  Positive for claudication, cyanosis and  leg swelling. Negative for chest pain, dyspnea on exertion, paroxysmal nocturnal dyspnea and syncope.   Objective  Blood pressure (!) 145/78, pulse 65, temperature 98 F (36.7 C), temperature source Temporal, resp. rate 16, height _0  (1.676 m), weight 159 lb (72.1 kg), SpO2 95 %. Body mass index is 25.66 kg/m.     05/18/2022    3:12 PM 12/20/2021   12:00 PM 04/18/2021   11:08 AM  Vitals with BMI  Height _1  _2  _3   Weight 159 lbs 159 lbs 6 oz 158 lbs  BMI 25.68 78.24 23.53  Systolic 614 431 540  Diastolic 78 70 80  Pulse 65 93 84    Physical Exam Cardiovascular:     Rate and Rhythm: Normal rate and regular rhythm.     Pulses:           Carotid pulses are 2+ on the right side with bruit and 2+ on the left side with bruit.      Radial pulses are 2+ on the right side and 2+ on the left side.       Femoral pulses are 1+ on the right side and 1+ on the left side.      Popliteal pulses are 0 on the right side and 0 on the left side.       Dorsalis pedis pulses are 0 on the right side and 0 on the left side.       Posterior tibial pulses are 0 on the right side and 0 on the left side.     Heart sounds: Normal heart sounds. No murmur heard.    No gallop.  Pulmonary:     Effort: Pulmonary effort is normal.     Breath sounds: Normal breath sounds. No wheezing or rales.  Musculoskeletal:     Right lower leg: Edema present.     Left lower leg: Edema present.  Feet:     Right foot:     Skin integrity: Skin breakdown and warmth present.     Left foot:     Skin integrity: Skin breakdown, erythema and warmth present.     Comments: Right fifth toe erythematous wound with purulent drainage. Discoloration noted to bilateral toes. Skin:    General: Skin is warm.     Coloration: Skin is pale (BLE).  Neurological:     Mental Status: She is alert.    Medications and allergies   Allergies  Allergen Reactions   Thorazine [Chlorpromazine] Anaphylaxis     Medication list after today's encounter   Current Outpatient Medications:    Abatacept (ORENCIA [place]), Infusion every 30 days per Dr Kathlene November, Disp: , Rfl:    albuterol (PROVENTIL HFA;VENTOLIN HFA) 108 (90 Base) MCG/ACT inhaler, Inhale 2 puffs into the lungs every 4 (four) hours as needed for wheezing or shortness of breath., Disp: , Rfl:    amLODipine (NORVASC) 5 MG tablet, Take 5 mg by mouth daily., Disp: , Rfl:    aspirin EC 81 MG tablet, Take 1 tablet (81 mg total) by mouth daily. Swallow whole., Disp: 30 tablet, Rfl: 12   bisoprolol (ZEBETA) 5 MG tablet, TAKE 1 TABLET(5 MG) BY MOUTH DAILY, Disp: 30 tablet, Rfl: 5   BREZTRI AEROSPHERE 160-9-4.8 MCG/ACT AERO, INHALE 2  INHALATIONS BY MOUTH  INTO THE LUNGS IN THE MORNING  AND AT BEDTIME, Disp: 32.1 g, Rfl: 3   clopidogrel (PLAVIX) 75 MG tablet, Take 1 tablet (75 mg total) by mouth daily., Disp: 90 tablet, Rfl:  3   Coenzyme Q10 (COQ-10) 100 MG CAPS, Take 1 capsule by mouth daily., Disp: , Rfl:    DULoxetine (CYMBALTA) 20 MG capsule, Take 20 mg by mouth daily., Disp: , Rfl:    HYDROcodone-acetaminophen (NORCO) 10-325 MG tablet, Take 1 tablet by mouth 3 (three) times daily as needed., Disp: , Rfl:    hydroxychloroquine (PLAQUENIL) 200 MG tablet, Take 200 mg by mouth daily., Disp: , Rfl:    leflunomide (ARAVA) 20 MG tablet, Take 20 mg by mouth daily., Disp: , Rfl:    losartan (COZAAR) 25 MG tablet, Take 1 tablet (25 mg total) by mouth daily., Disp: 90 tablet, Rfl: 3   Multiple Vitamins-Minerals (MULTIVITAMIN WOMEN) TABS, Take 1 tablet by mouth daily., Disp: , Rfl:    rosuvastatin (CRESTOR) 5 MG tablet, Take 5 mg by mouth daily., Disp: , Rfl:    triamterene-hydrochlorothiazide (DYAZIDE) 37.5-25 MG capsule, Take 1 capsule by mouth daily., Disp: , Rfl:   Laboratory examination:   Lab Results  Component Value Date   NA 136 10/26/2020   K 3.5 10/26/2020   CO2 26 10/26/2020   GLUCOSE 115 (H) 10/26/2020   BUN 18 10/26/2020   CREATININE 1.15 10/26/2020   CALCIUM 9.6 10/26/2020   GFRNONAA 38 (L) 09/23/2020       Latest Ref Rng & Units 10/26/2020    3:15 PM 09/23/2020   12:45 PM 02/13/2020   12:21 PM  CMP  Glucose 70 - 99 mg/dL 115  113  132   BUN 6 - 23 mg/dL 18  24  35   Creatinine 0.40 - 1.20 mg/dL 1.15  1.43  1.59   Sodium 135 - 145 mEq/L 136  136  135   Potassium 3.5 - 5.1 mEq/L 3.5  3.1  3.4   Chloride 96 - 112 mEq/L 99  97  92   CO2 19 - 32 mEq/L 26  25  33   Calcium 8.4 - 10.5 mg/dL 9.6  9.0  11.0   Total Protein 6.5 - 8.1 g/dL  7.2    Total Bilirubin 0.3 - 1.2 mg/dL  0.4    Alkaline Phos 38 - 126 U/L  47    AST 15 - 41 U/L  35    ALT 0 - 44 U/L  28        Latest Ref Rng & Units 10/26/2020     3:15 PM 09/23/2020   12:45 PM 02/13/2020   12:21 PM  CBC  WBC 4.0 - 10.5 K/uL 7.9  5.8  5.8   Hemoglobin 12.0 - 15.0 g/dL 11.2  13.2  15.9   Hematocrit 36.0 - 46.0 % 32.7  38.6  47.1   Platelets 150.0 - 400.0 K/uL 294.0  169  221.0     Lipid Panel No results for input(s): "CHOL", "TRIG", "LDLCALC", "VLDL", "HDL", "CHOLHDL", "LDLDIRECT" in the last 8760 hours.  HEMOGLOBIN A1C No results found for: "HGBA1C", "MPG" TSH No results for input(s): "TSH" in the last 8760 hours.  External labs:   02/15/2022: Sodium 140, potassium 4.0, glucose 104, BUN 21, creatinine 1.24 EGFR 42 Hemoglobin 13.7, hematocrit 40.5, platelet count 244  08/30/2021: Hemoglobin A1c 6.0%  Radiology:    Cardiac Studies:   High-resolution CT scan of the chest 03/02/2022: 1. Cardiovascular: Normal heart size. No pericardial effusion. Mitral annular calcifications. Severe three-vessel coronary artery calcifications. Normal caliber thoracic aorta with severe calcified plaque.  2. Aortic Atherosclerosis and Emphysema  3. Part solid nodule of the right upper lobe measuring  18 mm in overall diameter with 4 mm solid component. Follow-up non-contrast CT recommended at 3-6 months to confirm persistence.  Additional bilateral solid pulmonary nodules are seen, largest measures 4 mm. Recommend attention on follow-up.  Carotid artery duplex  03/01/2020:  Stenosis in the right internal carotid artery (1-15%). Stenosis in the  right external carotid artery (<50%).  Stenosis in the left internal carotid artery (16-49%). Stenosis in the  left external carotid artery (<50%).  Moderate diffuse heterogeneous plaque noted bilateral carotid arteries.  Antegrade right vertebral artery flow. Antegrade left vertebral artery  flow.  No significant change since 10/10/2018. Follow up in one year is  appropriate if clinically indicated.  US Vascular Lower Bilateral 02/12/19: IMPRESSION: 1. No femoropopliteal and no calf DVT in the  visualized calf veins. If clinical symptoms are inconsistent or if there are persistent or worsening symptoms, further imaging (possibly involving the iliac veins) may be warranted. 2. No evidence of lower extremity saphenous venous valvular incompetence or reflux.  Echocardiogram 10/17/2017: Left ventricle cavity is normal in size. Normal global wall motion. Doppler evidence of grade II (pseudonormal) diastolic dysfunction, elevated LAP. Calculated EF 56%. Mild to moderate tricuspid regurgitation. Pulmonary hypertension with estimated pulmonary artery systolic pressure 34 mmHg.  EKG:    Assessment     ICD-10-CM   1. Asymptomatic bilateral carotid artery stenosis  I65.23 PCV CAROTID DUPLEX (BILATERAL)    CBC    2. PAD (peripheral artery disease) (HCC)  I73.9 PCV ECHOCARDIOGRAM COMPLETE    PCV MYOCARDIAL PERFUSION WITH LEXISCAN    CMP14+EGFR    3. Primary hypertension  I10 CMP14+EGFR    CBC    4. Chronic lower limb ischemia non-limb threatening  I99.8     5. Open wound of toe right fifth toe, initial encounter  S91.109A     6. Shortness of breath  R06.02 PCV ECHOCARDIOGRAM COMPLETE    PCV MYOCARDIAL PERFUSION WITH LEXISCAN    CBC       Orders Placed This Encounter  Procedures   CMP14+EGFR   CBC   PCV MYOCARDIAL PERFUSION WITH LEXISCAN    Standing Status:   Future    Standing Expiration Date:   05/19/2023   PCV ECHOCARDIOGRAM COMPLETE    Standing Status:   Future    Standing Expiration Date:   05/19/2023    Meds ordered this encounter  Medications   clopidogrel (PLAVIX) 75 MG tablet    Sig: Take 1 tablet (75 mg total) by mouth daily.    Dispense:  90 tablet    Refill:  3    Order Specific Question:   Supervising Provider    Answer:   Adrian Prows [[id number]]   aspirin EC 81 MG tablet    Sig: Take 1 tablet (81 mg total) by mouth daily. Swallow whole.    Dispense:  30 tablet    Refill:  12    Order Specific Question:   Supervising Provider    Answer:   Adrian Prows  [[id number]]   losartan (COZAAR) 25 MG tablet    Sig: Take 1 tablet (25 mg total) by mouth daily.    Dispense:  90 tablet    Refill:  3    Order Specific Question:   Supervising Provider    Answer:   Adrian Prows [[id number]]    Medications Discontinued During This Encounter  Medication Reason   bisoprolol (ZEBETA) 5 MG tablet    Turmeric Curcumin 500 MG CAPS    azithromycin (ZITHROMAX) 250 MG tablet  predniSONE (DELTASONE) 10 MG tablet    potassium chloride SA (KLOR-CON) 20 MEQ tablet      Recommendations:   Asymptomatic bilateral carotid artery stenosis Reviewed previous carotid artery duplex. Will schedule for bilateral carotid artery duplex.  PAD (peripheral artery disease) (HCC) Chronic bilateral lower limb ischemia non-limb threatening Given presenting symptoms and physical examination suspect chronic bilateral limb ischemia. Do not suspect that this is limb threatening. CT chest competed on 08/09/2022 revealed severe three-vessel coronary artery calcifications. In view of this will schedule nuclear stress. Will complete this prior to BLE angiogram. Will schedule for lower extremity arterial duplex, carotid artery duplex.  Will preemptively schedule bilateral lower extremity angiogram with Dr. Einar Gip to be completed after stress test. Start Plavix 92m daily and Aspirin 832mdaily for primary prevention.  Open Toe Wound Left Fifth toe Advised patient to place guaze in between toes to allow for better airflow. Clean area with mild soap and warm water and dry thoroughly.  Do not suspect that she needs another course of antibiotics at this time.  Primary hypertension Blood pressure slightly elevated in office today. Start Losartan 257maily for better blood pressure control and PAD.  Follow-up after testing of sooner if needed.   BriErnst SpellGNP-C  05/18/2022, 5:06 PM Office: [phone]: [phone]

## 2022-05-18 NOTE — Progress Notes (Signed)
Primary Physician/Referring:  Deland Pretty, MD  Patient ID: Donna [name], female    DOB: 30-Sep-1945, 76 y.o.   MRN: [id number]  Chief Complaint  Patient presents with   Hypertension   purple toes   HPI:    Donna [name]  is a 76 y.o. female with past medical history of chronic palpitations dating back 2010, hypertension, hyperlipidemia, osteopenia, GERD, degenerative joint disease of lower back, chronic kidney disease stage II, and prediabetes.  She was previously seen in office by Dr. Einar Gip 3 years ago for bilateral carotid artery stenosis. She has been lost to follow-up until now. She presents today with complaints of discoloration of bilateral toes and feet.  Approximately 3 years ago she was seen by wound clinic in [facility] for nonhealing wound on the right foot and evidence of peripheral vascular disease.  Subsequently she underwent an angiogram however this was aborted due to inability to catheterize the left common femoral artery.  Since then she has experienced ongoing numbness and tingling in bilateral lower extremities, pain in bilateral calves on walking, discoloration of bilateral toes, swelling of right ankle, and slow healing wounds on right foot. She has shortness of breath with exertion and difficulty ambulating long distances.  Three months ago she developed a draining wound on her right fifth toe and was placed on antibiotics. This has since returned.  She denies chest pain, shortness of breath, palpitations, orthopnea, PND, syncope.  Past Medical History:  Diagnosis Date   Arthritis    lumbar, toes, L knee, cerv. spine & hands    Cancer ([place])    basal cell removed fr. face    COPD (chronic obstructive pulmonary disease) (HCC)    GERD (gastroesophageal reflux disease)    H/O echocardiogram    Hiatal hernia    History of hiatal hernia    Hypertension    Hypertension    currently followed for BP / heart management with Dr. Leanora Ivanoff, but prev. saw Dr. Einar Gip    Past Surgical History:  Procedure Laterality Date   BACK SURGERY  1994   cerv. spine fusion    BREAST ENHANCEMENT SURGERY     BREAST SURGERY  1980's   implants- bilateral    CERVICAL SPINE SURGERY  1992   fusion   JOINT REPLACEMENT Right 2007   LUMBAR LAMINECTOMY/DECOMPRESSION MICRODISCECTOMY Left 10/07/2015   Procedure: Lumbar three-five Decompressive lumbar Laminectomy & Left Lumbar four-five Microdiscectomy;  Surgeon: Jovita Gamma, MD;  Location: [facility];  Service: Neurosurgery;  Laterality: Left;   TONSILLECTOMY     TOTAL HIP ARTHROPLASTY  2007   Right   TUBAL LIGATION     Family History  Problem Relation Age of Onset   Heart disease Maternal Aunt    Heart disease Maternal Aunt    Lung cancer Sister    Kidney cancer Mother    Kidney cancer Maternal Uncle    Kidney cancer Maternal Aunt     Social History   Tobacco Use   Smoking status: Former    Packs/day: 0.70    Years: 24.00    Total pack years: 16.80    Types: Cigarettes    Quit date: 07/31/2009    Years since quitting: 12.8   Smokeless tobacco: Former    Quit date: 10/01/1998  Substance Use Topics   Alcohol use: Yes    Comment: wine- almost daily   Marital Status: Married  ROS  Review of Systems  Cardiovascular:  Positive for claudication, cyanosis and  leg swelling. Negative for chest pain, dyspnea on exertion, paroxysmal nocturnal dyspnea and syncope.   Objective  Blood pressure (!) 145/78, pulse 65, temperature 98 F (36.7 C), temperature source Temporal, resp. rate 16, height _0  (1.676 m), weight 159 lb (72.1 kg), SpO2 95 %. Body mass index is 25.66 kg/m.     05/18/2022    3:12 PM 12/20/2021   12:00 PM 04/18/2021   11:08 AM  Vitals with BMI  Height _1  _2  _3   Weight 159 lbs 159 lbs 6 oz 158 lbs  BMI 25.68 78.24 23.53  Systolic 614 431 540  Diastolic 78 70 80  Pulse 65 93 84    Physical Exam Cardiovascular:     Rate and Rhythm: Normal rate and regular rhythm.     Pulses:           Carotid pulses are 2+ on the right side with bruit and 2+ on the left side with bruit.      Radial pulses are 2+ on the right side and 2+ on the left side.       Femoral pulses are 1+ on the right side and 1+ on the left side.      Popliteal pulses are 0 on the right side and 0 on the left side.       Dorsalis pedis pulses are 0 on the right side and 0 on the left side.       Posterior tibial pulses are 0 on the right side and 0 on the left side.     Heart sounds: Normal heart sounds. No murmur heard.    No gallop.  Pulmonary:     Effort: Pulmonary effort is normal.     Breath sounds: Normal breath sounds. No wheezing or rales.  Musculoskeletal:     Right lower leg: Edema present.     Left lower leg: Edema present.  Feet:     Right foot:     Skin integrity: Skin breakdown and warmth present.     Left foot:     Skin integrity: Skin breakdown, erythema and warmth present.     Comments: Right fifth toe erythematous wound with purulent drainage. Discoloration noted to bilateral toes. Skin:    General: Skin is warm.     Coloration: Skin is pale (BLE).  Neurological:     Mental Status: She is alert.    Medications and allergies   Allergies  Allergen Reactions   Thorazine [Chlorpromazine] Anaphylaxis     Medication list after today's encounter   Current Outpatient Medications:    Abatacept (ORENCIA [place]), Infusion every 30 days per Dr Kathlene November, Disp: , Rfl:    albuterol (PROVENTIL HFA;VENTOLIN HFA) 108 (90 Base) MCG/ACT inhaler, Inhale 2 puffs into the lungs every 4 (four) hours as needed for wheezing or shortness of breath., Disp: , Rfl:    amLODipine (NORVASC) 5 MG tablet, Take 5 mg by mouth daily., Disp: , Rfl:    aspirin EC 81 MG tablet, Take 1 tablet (81 mg total) by mouth daily. Swallow whole., Disp: 30 tablet, Rfl: 12   bisoprolol (ZEBETA) 5 MG tablet, TAKE 1 TABLET(5 MG) BY MOUTH DAILY, Disp: 30 tablet, Rfl: 5   BREZTRI AEROSPHERE 160-9-4.8 MCG/ACT AERO, INHALE 2  INHALATIONS BY MOUTH  INTO THE LUNGS IN THE MORNING  AND AT BEDTIME, Disp: 32.1 g, Rfl: 3   clopidogrel (PLAVIX) 75 MG tablet, Take 1 tablet (75 mg total) by mouth daily., Disp: 90 tablet, Rfl:  3   Coenzyme Q10 (COQ-10) 100 MG CAPS, Take 1 capsule by mouth daily., Disp: , Rfl:    DULoxetine (CYMBALTA) 20 MG capsule, Take 20 mg by mouth daily., Disp: , Rfl:    HYDROcodone-acetaminophen (NORCO) 10-325 MG tablet, Take 1 tablet by mouth 3 (three) times daily as needed., Disp: , Rfl:    hydroxychloroquine (PLAQUENIL) 200 MG tablet, Take 200 mg by mouth daily., Disp: , Rfl:    leflunomide (ARAVA) 20 MG tablet, Take 20 mg by mouth daily., Disp: , Rfl:    losartan (COZAAR) 25 MG tablet, Take 1 tablet (25 mg total) by mouth daily., Disp: 90 tablet, Rfl: 3   Multiple Vitamins-Minerals (MULTIVITAMIN WOMEN) TABS, Take 1 tablet by mouth daily., Disp: , Rfl:    rosuvastatin (CRESTOR) 5 MG tablet, Take 5 mg by mouth daily., Disp: , Rfl:    triamterene-hydrochlorothiazide (DYAZIDE) 37.5-25 MG capsule, Take 1 capsule by mouth daily., Disp: , Rfl:   Laboratory examination:   Lab Results  Component Value Date   NA 136 10/26/2020   K 3.5 10/26/2020   CO2 26 10/26/2020   GLUCOSE 115 (H) 10/26/2020   BUN 18 10/26/2020   CREATININE 1.15 10/26/2020   CALCIUM 9.6 10/26/2020   GFRNONAA 38 (L) 09/23/2020       Latest Ref Rng & Units 10/26/2020    3:15 PM 09/23/2020   12:45 PM 02/13/2020   12:21 PM  CMP  Glucose 70 - 99 mg/dL 115  113  132   BUN 6 - 23 mg/dL 18  24  35   Creatinine 0.40 - 1.20 mg/dL 1.15  1.43  1.59   Sodium 135 - 145 mEq/L 136  136  135   Potassium 3.5 - 5.1 mEq/L 3.5  3.1  3.4   Chloride 96 - 112 mEq/L 99  97  92   CO2 19 - 32 mEq/L 26  25  33   Calcium 8.4 - 10.5 mg/dL 9.6  9.0  11.0   Total Protein 6.5 - 8.1 g/dL  7.2    Total Bilirubin 0.3 - 1.2 mg/dL  0.4    Alkaline Phos 38 - 126 U/L  47    AST 15 - 41 U/L  35    ALT 0 - 44 U/L  28        Latest Ref Rng & Units 10/26/2020     3:15 PM 09/23/2020   12:45 PM 02/13/2020   12:21 PM  CBC  WBC 4.0 - 10.5 K/uL 7.9  5.8  5.8   Hemoglobin 12.0 - 15.0 g/dL 11.2  13.2  15.9   Hematocrit 36.0 - 46.0 % 32.7  38.6  47.1   Platelets 150.0 - 400.0 K/uL 294.0  169  221.0     Lipid Panel No results for input(s): "CHOL", "TRIG", "LDLCALC", "VLDL", "HDL", "CHOLHDL", "LDLDIRECT" in the last 8760 hours.  HEMOGLOBIN A1C No results found for: "HGBA1C", "MPG" TSH No results for input(s): "TSH" in the last 8760 hours.  External labs:   02/15/2022: Sodium 140, potassium 4.0, glucose 104, BUN 21, creatinine 1.24 EGFR 42 Hemoglobin 13.7, hematocrit 40.5, platelet count 244  08/30/2021: Hemoglobin A1c 6.0%  Radiology:    Cardiac Studies:   High-resolution CT scan of the chest 03/02/2022: 1. Cardiovascular: Normal heart size. No pericardial effusion. Mitral annular calcifications. Severe three-vessel coronary artery calcifications. Normal caliber thoracic aorta with severe calcified plaque.  2. Aortic Atherosclerosis and Emphysema  3. Part solid nodule of the right upper lobe measuring  18 mm in overall diameter with 4 mm solid component. Follow-up non-contrast CT recommended at 3-6 months to confirm persistence.  Additional bilateral solid pulmonary nodules are seen, largest measures 4 mm. Recommend attention on follow-up.  Carotid artery duplex  03/01/2020:  Stenosis in the right internal carotid artery (1-15%). Stenosis in the  right external carotid artery (<50%).  Stenosis in the left internal carotid artery (16-49%). Stenosis in the  left external carotid artery (<50%).  Moderate diffuse heterogeneous plaque noted bilateral carotid arteries.  Antegrade right vertebral artery flow. Antegrade left vertebral artery  flow.  No significant change since 10/10/2018. Follow up in one year is  appropriate if clinically indicated.  US Vascular Lower Bilateral 02/12/19: IMPRESSION: 1. No femoropopliteal and no calf DVT in the  visualized calf veins. If clinical symptoms are inconsistent or if there are persistent or worsening symptoms, further imaging (possibly involving the iliac veins) may be warranted. 2. No evidence of lower extremity saphenous venous valvular incompetence or reflux.  Echocardiogram 10/17/2017: Left ventricle cavity is normal in size. Normal global wall motion. Doppler evidence of grade II (pseudonormal) diastolic dysfunction, elevated LAP. Calculated EF 56%. Mild to moderate tricuspid regurgitation. Pulmonary hypertension with estimated pulmonary artery systolic pressure 34 mmHg.  EKG:    Assessment     ICD-10-CM   1. Asymptomatic bilateral carotid artery stenosis  I65.23 PCV CAROTID DUPLEX (BILATERAL)    CBC    2. PAD (peripheral artery disease) (HCC)  I73.9 PCV ECHOCARDIOGRAM COMPLETE    PCV MYOCARDIAL PERFUSION WITH LEXISCAN    CMP14+EGFR    3. Primary hypertension  I10 CMP14+EGFR    CBC    4. Chronic lower limb ischemia non-limb threatening  I99.8     5. Open wound of toe right fifth toe, initial encounter  S91.109A     6. Shortness of breath  R06.02 PCV ECHOCARDIOGRAM COMPLETE    PCV MYOCARDIAL PERFUSION WITH LEXISCAN    CBC       Orders Placed This Encounter  Procedures   CMP14+EGFR   CBC   PCV MYOCARDIAL PERFUSION WITH LEXISCAN    Standing Status:   Future    Standing Expiration Date:   05/19/2023   PCV ECHOCARDIOGRAM COMPLETE    Standing Status:   Future    Standing Expiration Date:   05/19/2023    Meds ordered this encounter  Medications   clopidogrel (PLAVIX) 75 MG tablet    Sig: Take 1 tablet (75 mg total) by mouth daily.    Dispense:  90 tablet    Refill:  3    Order Specific Question:   Supervising Provider    Answer:   Adrian Prows [[id number]]   aspirin EC 81 MG tablet    Sig: Take 1 tablet (81 mg total) by mouth daily. Swallow whole.    Dispense:  30 tablet    Refill:  12    Order Specific Question:   Supervising Provider    Answer:   Adrian Prows  [[id number]]   losartan (COZAAR) 25 MG tablet    Sig: Take 1 tablet (25 mg total) by mouth daily.    Dispense:  90 tablet    Refill:  3    Order Specific Question:   Supervising Provider    Answer:   Adrian Prows [[id number]]    Medications Discontinued During This Encounter  Medication Reason   bisoprolol (ZEBETA) 5 MG tablet    Turmeric Curcumin 500 MG CAPS    azithromycin (ZITHROMAX) 250 MG tablet  predniSONE (DELTASONE) 10 MG tablet    potassium chloride SA (KLOR-CON) 20 MEQ tablet      Recommendations:   Asymptomatic bilateral carotid artery stenosis Reviewed previous carotid artery duplex. Will schedule for bilateral carotid artery duplex.  PAD (peripheral artery disease) (HCC) Chronic bilateral lower limb ischemia non-limb threatening Given presenting symptoms and physical examination suspect chronic bilateral limb ischemia. Do not suspect that this is limb threatening. CT chest competed on 08/09/2022 revealed severe three-vessel coronary artery calcifications. In view of this will schedule nuclear stress. Will complete this prior to BLE angiogram. Will schedule for lower extremity arterial duplex, carotid artery duplex.  Will preemptively schedule bilateral lower extremity angiogram with Dr. Einar Gip to be completed after stress test. Start Plavix 92m daily and Aspirin 832mdaily for primary prevention.  Open Toe Wound Left Fifth toe Advised patient to place guaze in between toes to allow for better airflow. Clean area with mild soap and warm water and dry thoroughly.  Do not suspect that she needs another course of antibiotics at this time.  Primary hypertension Blood pressure slightly elevated in office today. Start Losartan 257maily for better blood pressure control and PAD.  Follow-up after testing of sooner if needed.   BriErnst SpellGNP-C  05/18/2022, 5:06 PM Office: [phone]: [phone]

## 2022-05-19 DIAGNOSIS — R0602 Shortness of breath: Secondary | ICD-10-CM | POA: Diagnosis not present

## 2022-05-19 DIAGNOSIS — I6523 Occlusion and stenosis of bilateral carotid arteries: Secondary | ICD-10-CM | POA: Diagnosis not present

## 2022-05-19 DIAGNOSIS — I10 Essential (primary) hypertension: Secondary | ICD-10-CM | POA: Diagnosis not present

## 2022-05-19 DIAGNOSIS — I739 Peripheral vascular disease, unspecified: Secondary | ICD-10-CM | POA: Diagnosis not present

## 2022-05-20 LAB — CMP14+EGFR
ALT: 20 IU/L (ref 0–32)
AST: 21 IU/L (ref 0–40)
Albumin/Globulin Ratio: 1.9 (ref 1.2–2.2)
Albumin: 3.8 g/dL (ref 3.8–4.8)
Alkaline Phosphatase: 61 IU/L (ref 44–121)
BUN/Creatinine Ratio: 12 (ref 12–28)
BUN: 16 mg/dL (ref 8–27)
Bilirubin Total: 0.3 mg/dL (ref 0.0–1.2)
CO2: 21 mmol/L (ref 20–29)
Calcium: 9.3 mg/dL (ref 8.7–10.3)
Chloride: 100 mmol/L (ref 96–106)
Creatinine, Ser: 1.31 mg/dL — ABNORMAL HIGH (ref 0.57–1.00)
Globulin, Total: 2 g/dL (ref 1.5–4.5)
Glucose: 97 mg/dL (ref 70–99)
Potassium: 4 mmol/L (ref 3.5–5.2)
Sodium: 140 mmol/L (ref 134–144)
Total Protein: 5.8 g/dL — ABNORMAL LOW (ref 6.0–8.5)
eGFR: 42 mL/min/{1.73_m2} — ABNORMAL LOW (ref >59)

## 2022-05-20 LAB — CBC
Hematocrit: 33.8 % — ABNORMAL LOW (ref 34.0–46.6)
Hemoglobin: 11.6 g/dL (ref 11.1–15.9)
MCH: 32.3 pg (ref 26.6–33.0)
MCHC: 34.3 g/dL (ref 31.5–35.7)
MCV: 94 fL (ref 79–97)
Platelets: 309 10*3/uL (ref 150–450)
RBC: 3.59 x10E6/uL — ABNORMAL LOW (ref 3.77–5.28)
RDW: 12.8 % (ref 11.7–15.4)
WBC: 13.1 10*3/uL — ABNORMAL HIGH (ref 3.4–10.8)

## 2022-05-20 NOTE — Addendum Note (Signed)
Addended by: Kela Millin on: 05/20/2022 03:33 PM   Modules accepted: Level of Service

## 2022-05-22 ENCOUNTER — Ambulatory Visit: Payer: Medicare Other

## 2022-05-22 DIAGNOSIS — I739 Peripheral vascular disease, unspecified: Secondary | ICD-10-CM | POA: Diagnosis not present

## 2022-05-22 DIAGNOSIS — R0602 Shortness of breath: Secondary | ICD-10-CM | POA: Diagnosis not present

## 2022-05-23 DIAGNOSIS — I739 Peripheral vascular disease, unspecified: Secondary | ICD-10-CM

## 2022-05-25 ENCOUNTER — Ambulatory Visit: Payer: Medicare Other

## 2022-05-25 DIAGNOSIS — I6523 Occlusion and stenosis of bilateral carotid arteries: Secondary | ICD-10-CM

## 2022-05-25 DIAGNOSIS — I739 Peripheral vascular disease, unspecified: Secondary | ICD-10-CM

## 2022-05-25 DIAGNOSIS — R0602 Shortness of breath: Secondary | ICD-10-CM | POA: Diagnosis not present

## 2022-05-30 ENCOUNTER — Inpatient Hospital Stay (HOSPITAL_COMMUNITY)
Admission: RE | Disposition: A | Discharge status: Home / Self Care | Payer: Self-pay | Source: Home / Self Care | Attending: Cardiology

## 2022-05-30 ENCOUNTER — Other Ambulatory Visit: Payer: Self-pay | Admitting: Cardiology

## 2022-05-30 ENCOUNTER — Encounter (HOSPITAL_COMMUNITY): Payer: Self-pay | Admitting: Cardiology

## 2022-05-30 ENCOUNTER — Other Ambulatory Visit: Payer: Self-pay

## 2022-05-30 ENCOUNTER — Ambulatory Visit (HOSPITAL_COMMUNITY)
Admission: RE | Admit: 2022-05-30 | Discharge: 2022-05-30 | Disposition: A | Discharge status: Home / Self Care | Payer: Medicare Other | Source: Home / Self Care | Attending: Cardiology | Admitting: Cardiology

## 2022-05-30 DIAGNOSIS — I739 Peripheral vascular disease, unspecified: Secondary | ICD-10-CM

## 2022-05-30 DIAGNOSIS — G8929 Other chronic pain: Secondary | ICD-10-CM | POA: Diagnosis not present

## 2022-05-30 DIAGNOSIS — Z7951 Long term (current) use of inhaled steroids: Secondary | ICD-10-CM | POA: Diagnosis not present

## 2022-05-30 DIAGNOSIS — I70218 Atherosclerosis of native arteries of extremities with intermittent claudication, other extremity: Secondary | ICD-10-CM

## 2022-05-30 DIAGNOSIS — I70235 Atherosclerosis of native arteries of right leg with ulceration of other part of foot: Secondary | ICD-10-CM | POA: Insufficient documentation

## 2022-05-30 DIAGNOSIS — I129 Hypertensive chronic kidney disease with stage 1 through stage 4 chronic kidney disease, or unspecified chronic kidney disease: Secondary | ICD-10-CM | POA: Diagnosis not present

## 2022-05-30 DIAGNOSIS — E7800 Pure hypercholesterolemia, unspecified: Secondary | ICD-10-CM | POA: Diagnosis not present

## 2022-05-30 DIAGNOSIS — N1832 Chronic kidney disease, stage 3b: Secondary | ICD-10-CM | POA: Diagnosis not present

## 2022-05-30 DIAGNOSIS — I70212 Atherosclerosis of native arteries of extremities with intermittent claudication, left leg: Secondary | ICD-10-CM | POA: Diagnosis not present

## 2022-05-30 DIAGNOSIS — Z8249 Family history of ischemic heart disease and other diseases of the circulatory system: Secondary | ICD-10-CM | POA: Diagnosis not present

## 2022-05-30 DIAGNOSIS — I878 Other specified disorders of veins: Secondary | ICD-10-CM

## 2022-05-30 DIAGNOSIS — R7303 Prediabetes: Secondary | ICD-10-CM | POA: Diagnosis present

## 2022-05-30 DIAGNOSIS — I10 Essential (primary) hypertension: Secondary | ICD-10-CM | POA: Diagnosis not present

## 2022-05-30 DIAGNOSIS — I701 Atherosclerosis of renal artery: Secondary | ICD-10-CM | POA: Diagnosis not present

## 2022-05-30 DIAGNOSIS — M549 Dorsalgia, unspecified: Secondary | ICD-10-CM | POA: Diagnosis not present

## 2022-05-30 DIAGNOSIS — M8580 Other specified disorders of bone density and structure, unspecified site: Secondary | ICD-10-CM | POA: Diagnosis present

## 2022-05-30 DIAGNOSIS — I2510 Atherosclerotic heart disease of native coronary artery without angina pectoris: Secondary | ICD-10-CM | POA: Diagnosis present

## 2022-05-30 DIAGNOSIS — L97519 Non-pressure chronic ulcer of other part of right foot with unspecified severity: Secondary | ICD-10-CM | POA: Insufficient documentation

## 2022-05-30 DIAGNOSIS — N182 Chronic kidney disease, stage 2 (mild): Secondary | ICD-10-CM | POA: Insufficient documentation

## 2022-05-30 DIAGNOSIS — Z87891 Personal history of nicotine dependence: Secondary | ICD-10-CM | POA: Diagnosis not present

## 2022-05-30 DIAGNOSIS — R0602 Shortness of breath: Secondary | ICD-10-CM | POA: Insufficient documentation

## 2022-05-30 DIAGNOSIS — K219 Gastro-esophageal reflux disease without esophagitis: Secondary | ICD-10-CM | POA: Diagnosis present

## 2022-05-30 DIAGNOSIS — D62 Acute posthemorrhagic anemia: Secondary | ICD-10-CM | POA: Diagnosis not present

## 2022-05-30 DIAGNOSIS — I6523 Occlusion and stenosis of bilateral carotid arteries: Secondary | ICD-10-CM | POA: Diagnosis not present

## 2022-05-30 DIAGNOSIS — M069 Rheumatoid arthritis, unspecified: Secondary | ICD-10-CM | POA: Diagnosis not present

## 2022-05-30 DIAGNOSIS — F32A Depression, unspecified: Secondary | ICD-10-CM | POA: Diagnosis not present

## 2022-05-30 DIAGNOSIS — R262 Difficulty in walking, not elsewhere classified: Secondary | ICD-10-CM | POA: Diagnosis present

## 2022-05-30 DIAGNOSIS — M1990 Unspecified osteoarthritis, unspecified site: Secondary | ICD-10-CM | POA: Diagnosis not present

## 2022-05-30 DIAGNOSIS — Z981 Arthrodesis status: Secondary | ICD-10-CM | POA: Diagnosis not present

## 2022-05-30 DIAGNOSIS — Z79899 Other long term (current) drug therapy: Secondary | ICD-10-CM | POA: Diagnosis not present

## 2022-05-30 DIAGNOSIS — Z801 Family history of malignant neoplasm of trachea, bronchus and lung: Secondary | ICD-10-CM | POA: Diagnosis not present

## 2022-05-30 DIAGNOSIS — J440 Chronic obstructive pulmonary disease with acute lower respiratory infection: Secondary | ICD-10-CM | POA: Diagnosis not present

## 2022-05-30 DIAGNOSIS — E785 Hyperlipidemia, unspecified: Secondary | ICD-10-CM | POA: Insufficient documentation

## 2022-05-30 DIAGNOSIS — I7092 Chronic total occlusion of artery of the extremities: Secondary | ICD-10-CM | POA: Diagnosis not present

## 2022-05-30 DIAGNOSIS — Z8051 Family history of malignant neoplasm of kidney: Secondary | ICD-10-CM | POA: Diagnosis not present

## 2022-05-30 DIAGNOSIS — J449 Chronic obstructive pulmonary disease, unspecified: Secondary | ICD-10-CM | POA: Diagnosis not present

## 2022-05-30 DIAGNOSIS — I70213 Atherosclerosis of native arteries of extremities with intermittent claudication, bilateral legs: Secondary | ICD-10-CM | POA: Diagnosis not present

## 2022-05-30 HISTORY — PX: ABDOMINAL AORTOGRAM W/LOWER EXTREMITY: CATH118223

## 2022-05-30 SURGERY — ABDOMINAL AORTOGRAM W/LOWER EXTREMITY
Anesthesia: LOCAL

## 2022-05-30 MED ORDER — LIDOCAINE HCL (PF) 1 % IJ SOLN
INTRAMUSCULAR | Status: DC | PRN
  Administered 2022-05-30: 5 mL via SUBCUTANEOUS

## 2022-05-30 MED ORDER — SODIUM CHLORIDE 0.9 % IV SOLN: INTRAVENOUS | Status: DC

## 2022-05-30 MED ORDER — SODIUM CHLORIDE 0.9 % IV SOLN: 250.0000 mL | INTRAVENOUS | Status: DC | PRN

## 2022-05-30 MED ORDER — MIDAZOLAM HCL 2 MG/2ML IJ SOLN
INTRAMUSCULAR | Status: DC | PRN
  Administered 2022-05-30: 2 mg via INTRAVENOUS

## 2022-05-30 MED ORDER — SODIUM CHLORIDE 0.9% FLUSH: 3.0000 mL | Freq: Two times a day (BID) | INTRAVENOUS | Status: DC

## 2022-05-30 MED ORDER — SODIUM CHLORIDE 0.9 % WEIGHT BASED INFUSION: 1.0000 mL/kg/h | INTRAVENOUS | Status: DC

## 2022-05-30 MED ORDER — ACETAMINOPHEN 325 MG PO TABS: 650.0000 mg | ORAL_TABLET | ORAL | Status: DC | PRN

## 2022-05-30 MED ORDER — FENTANYL CITRATE (PF) 100 MCG/2ML IJ SOLN
INTRAMUSCULAR | Status: DC | PRN
  Administered 2022-05-30: 50 ug via INTRAVENOUS

## 2022-05-30 MED ORDER — SODIUM CHLORIDE 0.9% FLUSH: 3.0000 mL | INTRAVENOUS | Status: DC | PRN

## 2022-05-30 MED ORDER — IODIXANOL 320 MG/ML IV SOLN
INTRAVENOUS | Status: DC | PRN
  Administered 2022-05-30: 80 mL via INTRA_ARTERIAL

## 2022-05-30 MED ORDER — ONDANSETRON HCL 4 MG/2ML IJ SOLN: 4.0000 mg | Freq: Four times a day (QID) | INTRAMUSCULAR | Status: DC | PRN

## 2022-05-30 MED ORDER — HEPARIN (PORCINE) IN NACL 1000-0.9 UT/500ML-% IV SOLN
INTRAVENOUS | Status: AC
  Filled 2022-05-30: qty 1000

## 2022-05-30 MED ORDER — MIDAZOLAM HCL 2 MG/2ML IJ SOLN
INTRAMUSCULAR | Status: AC
  Filled 2022-05-30: qty 2

## 2022-05-30 MED ORDER — FENTANYL CITRATE (PF) 100 MCG/2ML IJ SOLN
INTRAMUSCULAR | Status: AC
  Filled 2022-05-30: qty 2

## 2022-05-30 MED ORDER — SODIUM CHLORIDE 0.9 % IV BOLUS
500.0000 mL | Freq: Once | INTRAVENOUS | Status: AC
  Administered 2022-05-30: 500 mL via INTRAVENOUS

## 2022-05-30 MED ORDER — LIDOCAINE HCL (PF) 1 % IJ SOLN
INTRAMUSCULAR | Status: AC
  Filled 2022-05-30: qty 30

## 2022-05-30 SURGICAL SUPPLY — 12 items
CATH ANGIO 5F PIGTAIL 65CM (CATHETERS) IMPLANT
CLOSURE MYNX CONTROL 5F (Vascular Products) IMPLANT
KIT MICROPUNCTURE NIT STIFF (SHEATH) IMPLANT
KIT PV (KITS) ×1 IMPLANT
SHEATH PINNACLE 5F 10CM (SHEATH) IMPLANT
SHEATH PROBE COVER 6X72 (BAG) IMPLANT
STOPCOCK MORSE 400PSI 3WAY (MISCELLANEOUS) IMPLANT
SYR MEDRAD MARK 7 150ML (SYRINGE) ×1 IMPLANT
TRANSDUCER W/STOPCOCK (MISCELLANEOUS) ×1 IMPLANT
TRAY PV CATH (CUSTOM PROCEDURE TRAY) ×1 IMPLANT
TUBING CIL FLEX 10 FLL-RA (TUBING) IMPLANT
WIRE BENTSON .035X145CM (WIRE) IMPLANT

## 2022-05-30 NOTE — Interval H&P Note (Signed)
History and Physical Interval Note:  05/30/2022 6:40 AM  Donna [name]  has presented today for surgery, with the diagnosis of claudication and non  healing wound.  The various methods of treatment have been discussed with the patient and family. After consideration of risks, benefits and other options for treatment, the patient has consented to  Procedure(s): ABDOMINAL AORTOGRAM W/LOWER EXTREMITY (N/A) and possible angioplasty as a surgical intervention.  The patient's history has been reviewed, patient examined, no change in status, stable for surgery.  I have reviewed the patient's chart and labs.  Questions were answered to the patient's satisfaction.     Adrian Prows

## 2022-05-30 NOTE — Progress Notes (Signed)
yours

## 2022-05-30 NOTE — H&P (View-Only) (Signed)
[facility]  Patient name: Donna [name] MRN: [id number] DOB: [birth year] Sex: female   REQUESTING PROVIDER:    Dr. Einar Gip   REASON FOR CONSULT:    Left leg claudication  HISTORY OF PRESENT ILLNESS:   Donna [name] is a 76 y.o. female, who I have been asked to evaluate for left leg claudication.  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  Surgical reconstruction has been recommended.  Patient suffers from lifestyle limiting claudication.  She currently does not have an open wound however she has had issues in the past with a right foot wound which is now healed.  She does complain of discoloration in her toes and feet on both sides.  Patient suffers from shortness of breath with exertion.  She does not have any chest pain or palpitations.  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  She takes a statin for hypercholesterolemia.  She is on ARB for hypertension  PAST MEDICAL HISTORY    Past Medical History:  Diagnosis Date   Arthritis    lumbar, toes, L knee, cerv. spine & hands    Cancer ([place])    basal cell removed fr. face    COPD (chronic obstructive pulmonary disease) (HCC)    GERD (gastroesophageal reflux disease)    H/O echocardiogram    Hiatal hernia    History of hiatal hernia    Hypertension    Hypertension    currently followed for BP / heart management with Dr. Leanora Ivanoff, but prev. saw Dr. Einar Gip     FAMILY HISTORY   Family History  Problem Relation Age of Onset   Heart disease Maternal Aunt    Heart disease Maternal Aunt    Lung cancer Sister    Kidney cancer Mother    Kidney cancer Maternal Uncle    Kidney cancer Maternal Aunt     SOCIAL HISTORY:   Social History   Socioeconomic History   Marital status: Married    Spouse name: now divorced   Number of children: Y   Years of  education: Not on file   Highest education level: Not on file  Occupational History   Occupation: Art therapist    Comment: retired  Tobacco Use   Smoking status: Former    Packs/day: 0.70    Years: 24.00    Total pack years: 16.80    Types: Cigarettes    Quit date: 07/31/2009    Years since quitting: 12.8   Smokeless tobacco: Former    Quit date: 10/01/1998  Substance and Sexual Activity   Alcohol use: Yes    Comment: wine- almost daily   Drug use: No   Sexual activity: Not on file  Other Topics Concern   Not on file  Social History Narrative   ** Merged History Encounter **       Pt is divorced and now lives with [employer].    Social Determinants of Health   Financial Resource Strain: Not on file  Food Insecurity: Not on file  Transportation Needs: Not on file  Physical Activity: Not on file  Stress: Not on file  Social Connections: Not on file  Intimate Partner Violence: Not on file    ALLERGIES:    Allergies  Allergen Reactions   Thorazine [Chlorpromazine] Anaphylaxis    CURRENT MEDICATIONS:    Current Facility-Administered Medications  Medication Dose Route Frequency Provider Last  Rate Last Admin   0.9 %  sodium chloride infusion   Intravenous Continuous Adrian Prows, MD 150 mL/hr at 05/30/22 0654 Rate Change at 05/30/22 0654   0.9 %  sodium chloride infusion  250 mL Intravenous PRN Adrian Prows, MD       0.9 %  sodium chloride infusion  250 mL Intravenous PRN Adrian Prows, MD       0.9% sodium chloride infusion  1 mL/kg/hr Intravenous Continuous Adrian Prows, MD 69.9 mL/hr at 05/30/22 0849 1 mL/kg/hr at 05/30/22 0849   acetaminophen (TYLENOL) tablet 650 mg  650 mg Oral Q4H PRN Adrian Prows, MD       fentaNYL (SUBLIMAZE) injection    PRN Adrian Prows, MD   50 mcg at 05/30/22 0750   iodixanol (VISIPAQUE) 320 MG/ML injection    PRN Adrian Prows, MD   80 mL at 05/30/22 0831   lidocaine (PF) (XYLOCAINE) 1 % injection    PRN Adrian Prows, MD   5 mL at 05/30/22 0754    midazolam (VERSED) injection    PRN Adrian Prows, MD   2 mg at 05/30/22 0750   ondansetron (ZOFRAN) injection 4 mg  4 mg Intravenous Q6H PRN Adrian Prows, MD       sodium chloride flush (NS) 0.9 % injection 3 mL  3 mL Intravenous Q12H Adrian Prows, MD       sodium chloride flush (NS) 0.9 % injection 3 mL  3 mL Intravenous PRN Adrian Prows, MD       sodium chloride flush (NS) 0.9 % injection 3 mL  3 mL Intravenous Q12H Adrian Prows, MD       sodium chloride flush (NS) 0.9 % injection 3 mL  3 mL Intravenous PRN Adrian Prows, MD        REVIEW OF SYSTEMS:   '[X]'$  denotes positive finding, '[ ]'$  denotes negative finding Cardiac  Comments:  Chest pain or chest pressure:    Shortness of breath upon exertion:    Short of breath when lying flat:    Irregular heart rhythm:        Vascular    Pain in calf, thigh, or hip brought on by ambulation: x   Pain in feet at night that wakes you up from your sleep:     Blood clot in your veins:    Leg swelling:         Pulmonary    Oxygen at home:    Productive cough:     Wheezing:         Neurologic    Sudden weakness in arms or legs:     Sudden numbness in arms or legs:     Sudden onset of difficulty speaking or slurred speech:    Temporary loss of vision in one eye:     Problems with dizziness:         Gastrointestinal    Blood in stool:      Vomited blood:         Genitourinary    Burning when urinating:     Blood in urine:        Psychiatric    Major depression:         Hematologic    Bleeding problems:    Problems with blood clotting too easily:        Skin    Rashes or ulcers:        Constitutional    Fever or chills:     PHYSICAL  EXAM:   Vitals:   05/30/22 0829 05/30/22 0834 05/30/22 0845 05/30/22 0900  BP: 139/63  (!) 123/57 (!) 113/100  Pulse: 78 77 79 78  Resp: 14 (!) 23    Temp:      TempSrc:      SpO2: 98% 97% 91% 90%  Weight:      Height:        GENERAL: The patient is a well-nourished female, in no acute distress. The  vital signs are documented above. CARDIAC: There is a regular rate and rhythm. PULMONARY: Nonlabored respirations ABDOMEN: Soft and non-tender.  MUSCULOSKELETAL: There are no major deformities or cyanosis. NEUROLOGIC: No focal weakness or paresthesias are detected. SKIN: There are no ulcers or rashes noted. PSYCHIATRIC: The patient has a normal affect.  STUDIES:   I have reviewed her arteriogram that shows nearly occlusive lesion within the left common femoral artery.  I do not see a large profunda branch either.  ASSESSMENT and PLAN   Severe left leg claudication: I discussed with the patient and her husband that we have recommended proceeding with left femoral endarterectomy.  I discussed the details of the operation including the risks and benefits.  Talked about wound healing and wound complications as well as leg swelling and infection.  All questions were answered.  We would like to get this done soon as possible.  I will tentatively get her on the schedule for Friday.  She is going to stop her Plavix today.   Leia Alf, MD, FACS [facility] [phone] Pager [phone]

## 2022-05-30 NOTE — Consult Note (Signed)
[facility] [place]  Patient name: Donna [name] MRN: [id number] DOB: [birth year] Sex: female   REQUESTING PROVIDER:    Dr. Einar Gip   REASON FOR CONSULT:    Left leg claudication  HISTORY OF PRESENT ILLNESS:   Donna [name] is a 76 y.o. female, who I have been asked to evaluate for left leg claudication.  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  Surgical reconstruction has been recommended.  Patient suffers from lifestyle limiting claudication.  She currently does not have an open wound however she has had issues in the past with a right foot wound which is now healed.  She does complain of discoloration in her toes and feet on both sides.  Patient suffers from shortness of breath with exertion.  She does not have any chest pain or palpitations.  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  She takes a statin for hypercholesterolemia.  She is on ARB for hypertension  PAST MEDICAL HISTORY    Past Medical History:  Diagnosis Date   Arthritis    lumbar, toes, L knee, cerv. spine & hands    Cancer (Door)    basal cell removed fr. face    COPD (chronic obstructive pulmonary disease) (HCC)    GERD (gastroesophageal reflux disease)    H/O echocardiogram    Hiatal hernia    History of hiatal hernia    Hypertension    Hypertension    currently followed for BP / heart management with Dr. Leanora Ivanoff, but prev. saw Dr. Einar Gip     FAMILY HISTORY   Family History  Problem Relation Age of Onset   Heart disease Maternal Aunt    Heart disease Maternal Aunt    Lung cancer Sister    Kidney cancer Mother    Kidney cancer Maternal Uncle    Kidney cancer Maternal Aunt     SOCIAL HISTORY:   Social History   Socioeconomic History   Marital status: Married    Spouse name: now divorced   Number of children: Y   Years of  education: Not on file   Highest education level: Not on file  Occupational History   Occupation: Art therapist    Comment: retired  Tobacco Use   Smoking status: Former    Packs/day: 0.70    Years: 24.00    Total pack years: 16.80    Types: Cigarettes    Quit date: 07/31/2009    Years since quitting: 12.8   Smokeless tobacco: Former    Quit date: 10/01/1998  Substance and Sexual Activity   Alcohol use: Yes    Comment: wine- almost daily   Drug use: No   Sexual activity: Not on file  Other Topics Concern   Not on file  Social History Narrative   ** Merged History Encounter **       Pt is divorced and now lives with [employer].    Social Determinants of Health   Financial Resource Strain: Not on file  Food Insecurity: Not on file  Transportation Needs: Not on file  Physical Activity: Not on file  Stress: Not on file  Social Connections: Not on file  Intimate Partner Violence: Not on file    ALLERGIES:    Allergies  Allergen Reactions   Thorazine [Chlorpromazine] Anaphylaxis    CURRENT MEDICATIONS:    Current Facility-Administered Medications  Medication Dose Route Frequency Provider Last  Rate Last Admin   0.9 %  sodium chloride infusion   Intravenous Continuous Adrian Prows, MD 150 mL/hr at 05/30/22 0654 Rate Change at 05/30/22 0654   0.9 %  sodium chloride infusion  250 mL Intravenous PRN Adrian Prows, MD       0.9 %  sodium chloride infusion  250 mL Intravenous PRN Adrian Prows, MD       0.9% sodium chloride infusion  1 mL/kg/hr Intravenous Continuous Adrian Prows, MD 69.9 mL/hr at 05/30/22 0849 1 mL/kg/hr at 05/30/22 0849   acetaminophen (TYLENOL) tablet 650 mg  650 mg Oral Q4H PRN Adrian Prows, MD       fentaNYL (SUBLIMAZE) injection    PRN Adrian Prows, MD   50 mcg at 05/30/22 0750   iodixanol (VISIPAQUE) 320 MG/ML injection    PRN Adrian Prows, MD   80 mL at 05/30/22 0831   lidocaine (PF) (XYLOCAINE) 1 % injection    PRN Adrian Prows, MD   5 mL at 05/30/22 0754    midazolam (VERSED) injection    PRN Adrian Prows, MD   2 mg at 05/30/22 0750   ondansetron (ZOFRAN) injection 4 mg  4 mg Intravenous Q6H PRN Adrian Prows, MD       sodium chloride flush (NS) 0.9 % injection 3 mL  3 mL Intravenous Q12H Adrian Prows, MD       sodium chloride flush (NS) 0.9 % injection 3 mL  3 mL Intravenous PRN Adrian Prows, MD       sodium chloride flush (NS) 0.9 % injection 3 mL  3 mL Intravenous Q12H Adrian Prows, MD       sodium chloride flush (NS) 0.9 % injection 3 mL  3 mL Intravenous PRN Adrian Prows, MD        REVIEW OF SYSTEMS:   '[X]'$  denotes positive finding, '[ ]'$  denotes negative finding Cardiac  Comments:  Chest pain or chest pressure:    Shortness of breath upon exertion:    Short of breath when lying flat:    Irregular heart rhythm:        Vascular    Pain in calf, thigh, or hip brought on by ambulation: x   Pain in feet at night that wakes you up from your sleep:     Blood clot in your veins:    Leg swelling:         Pulmonary    Oxygen at home:    Productive cough:     Wheezing:         Neurologic    Sudden weakness in arms or legs:     Sudden numbness in arms or legs:     Sudden onset of difficulty speaking or slurred speech:    Temporary loss of vision in one eye:     Problems with dizziness:         Gastrointestinal    Blood in stool:      Vomited blood:         Genitourinary    Burning when urinating:     Blood in urine:        Psychiatric    Major depression:         Hematologic    Bleeding problems:    Problems with blood clotting too easily:        Skin    Rashes or ulcers:        Constitutional    Fever or chills:     PHYSICAL  EXAM:   Vitals:   05/30/22 0829 05/30/22 0834 05/30/22 0845 05/30/22 0900  BP: 139/63  (!) 123/57 (!) 113/100  Pulse: 78 77 79 78  Resp: 14 (!) 23    Temp:      TempSrc:      SpO2: 98% 97% 91% 90%  Weight:      Height:        GENERAL: The patient is a well-nourished female, in no acute distress. The  vital signs are documented above. CARDIAC: There is a regular rate and rhythm. PULMONARY: Nonlabored respirations ABDOMEN: Soft and non-tender.  MUSCULOSKELETAL: There are no major deformities or cyanosis. NEUROLOGIC: No focal weakness or paresthesias are detected. SKIN: There are no ulcers or rashes noted. PSYCHIATRIC: The patient has a normal affect.  STUDIES:   I have reviewed her arteriogram that shows nearly occlusive lesion within the left common femoral artery.  I do not see a large profunda branch either.  ASSESSMENT and PLAN   Severe left leg claudication: I discussed with the patient and her husband that we have recommended proceeding with left femoral endarterectomy.  I discussed the details of the operation including the risks and benefits.  Talked about wound healing and wound complications as well as leg swelling and infection.  All questions were answered.  We would like to get this done soon as possible.  I will tentatively get her on the schedule for Friday.  She is going to stop her Plavix today.   Leia Alf, MD, FACS [facility] [phone] Pager [phone]

## 2022-05-30 NOTE — Progress Notes (Signed)
ICD-10-CM   1. PAD (peripheral artery disease) (HCC)  I73.9 Ambulatory referral to Vascular Surgery     Orders Placed This Encounter  Procedures   Ambulatory referral to Vascular Surgery    Referral Priority:   Routine    Referral Type:   Surgical    Referral Reason:   Specialty Services Required    Referred to Provider:   Serafina Mitchell, MD    Requested Specialty:   Vascular Surgery    Number of Visits Requested:   1    No orders of the defined types were placed in this encounter.

## 2022-06-01 ENCOUNTER — Encounter (HOSPITAL_COMMUNITY): Payer: Self-pay | Admitting: Surgery

## 2022-06-01 ENCOUNTER — Other Ambulatory Visit: Payer: Self-pay

## 2022-06-01 MED FILL — Heparin Sod (Porcine)-NaCl IV Soln 1000 Unit/500ML-0.9%: INTRAVENOUS | Qty: 1000 | Status: AC

## 2022-06-01 NOTE — Progress Notes (Signed)
Spoke with pt for pre-op call. Pt denies cardiac disease but is seen by Dr. Einar Gip for PAD. Pt was recently started on Plavix. She states Dr. Nadyne Coombes instructed her to stop Plavix 3 days prior to surgery. Last dose was 05/31/22 AM dose. Pt states she is not diabetic.   Shower instructions given to pt and she voiced understanding.

## 2022-06-02 ENCOUNTER — Inpatient Hospital Stay (HOSPITAL_COMMUNITY): Payer: Medicare Other | Admitting: Certified Registered Nurse Anesthetist

## 2022-06-02 ENCOUNTER — Encounter (HOSPITAL_COMMUNITY): Payer: Self-pay | Admitting: Surgery

## 2022-06-02 ENCOUNTER — Other Ambulatory Visit: Payer: Self-pay

## 2022-06-02 ENCOUNTER — Encounter (HOSPITAL_COMMUNITY)
Admission: RE | Disposition: A | Discharge status: Home / Self Care | Payer: Self-pay | Source: Home / Self Care | Attending: Surgery

## 2022-06-02 ENCOUNTER — Inpatient Hospital Stay (HOSPITAL_COMMUNITY)
Admission: RE | Admit: 2022-06-02 | Discharge: 2022-06-03 | DRG: 253 | Disposition: A | Discharge status: Home / Self Care | Payer: Medicare Other | Attending: Surgery | Admitting: Surgery

## 2022-06-02 DIAGNOSIS — Z8249 Family history of ischemic heart disease and other diseases of the circulatory system: Secondary | ICD-10-CM

## 2022-06-02 DIAGNOSIS — I739 Peripheral vascular disease, unspecified: Principal | ICD-10-CM | POA: Diagnosis present

## 2022-06-02 DIAGNOSIS — Z8051 Family history of malignant neoplasm of kidney: Secondary | ICD-10-CM | POA: Diagnosis not present

## 2022-06-02 DIAGNOSIS — R7303 Prediabetes: Secondary | ICD-10-CM | POA: Diagnosis present

## 2022-06-02 DIAGNOSIS — G8929 Other chronic pain: Secondary | ICD-10-CM | POA: Diagnosis present

## 2022-06-02 DIAGNOSIS — M8580 Other specified disorders of bone density and structure, unspecified site: Secondary | ICD-10-CM | POA: Diagnosis present

## 2022-06-02 DIAGNOSIS — Z87891 Personal history of nicotine dependence: Secondary | ICD-10-CM

## 2022-06-02 DIAGNOSIS — E7800 Pure hypercholesterolemia, unspecified: Secondary | ICD-10-CM | POA: Diagnosis present

## 2022-06-02 DIAGNOSIS — I129 Hypertensive chronic kidney disease with stage 1 through stage 4 chronic kidney disease, or unspecified chronic kidney disease: Secondary | ICD-10-CM | POA: Diagnosis present

## 2022-06-02 DIAGNOSIS — M069 Rheumatoid arthritis, unspecified: Secondary | ICD-10-CM | POA: Diagnosis present

## 2022-06-02 DIAGNOSIS — I2510 Atherosclerotic heart disease of native coronary artery without angina pectoris: Secondary | ICD-10-CM | POA: Diagnosis present

## 2022-06-02 DIAGNOSIS — J440 Chronic obstructive pulmonary disease with acute lower respiratory infection: Secondary | ICD-10-CM | POA: Diagnosis present

## 2022-06-02 DIAGNOSIS — I70212 Atherosclerosis of native arteries of extremities with intermittent claudication, left leg: Secondary | ICD-10-CM | POA: Diagnosis present

## 2022-06-02 DIAGNOSIS — N1832 Chronic kidney disease, stage 3b: Secondary | ICD-10-CM | POA: Diagnosis present

## 2022-06-02 DIAGNOSIS — Z801 Family history of malignant neoplasm of trachea, bronchus and lung: Secondary | ICD-10-CM | POA: Diagnosis not present

## 2022-06-02 DIAGNOSIS — Z981 Arthrodesis status: Secondary | ICD-10-CM

## 2022-06-02 DIAGNOSIS — Z7902 Long term (current) use of antithrombotics/antiplatelets: Secondary | ICD-10-CM

## 2022-06-02 DIAGNOSIS — I6523 Occlusion and stenosis of bilateral carotid arteries: Secondary | ICD-10-CM | POA: Diagnosis present

## 2022-06-02 DIAGNOSIS — Z888 Allergy status to other drugs, medicaments and biological substances status: Secondary | ICD-10-CM

## 2022-06-02 DIAGNOSIS — D62 Acute posthemorrhagic anemia: Secondary | ICD-10-CM | POA: Diagnosis not present

## 2022-06-02 DIAGNOSIS — Z87892 Personal history of anaphylaxis: Secondary | ICD-10-CM

## 2022-06-02 DIAGNOSIS — M549 Dorsalgia, unspecified: Secondary | ICD-10-CM | POA: Diagnosis present

## 2022-06-02 DIAGNOSIS — F32A Depression, unspecified: Secondary | ICD-10-CM | POA: Diagnosis present

## 2022-06-02 DIAGNOSIS — Z79899 Other long term (current) drug therapy: Secondary | ICD-10-CM | POA: Diagnosis not present

## 2022-06-02 DIAGNOSIS — I10 Essential (primary) hypertension: Secondary | ICD-10-CM

## 2022-06-02 DIAGNOSIS — K219 Gastro-esophageal reflux disease without esophagitis: Secondary | ICD-10-CM | POA: Diagnosis present

## 2022-06-02 DIAGNOSIS — R262 Difficulty in walking, not elsewhere classified: Secondary | ICD-10-CM | POA: Diagnosis present

## 2022-06-02 DIAGNOSIS — Z7951 Long term (current) use of inhaled steroids: Secondary | ICD-10-CM | POA: Diagnosis not present

## 2022-06-02 DIAGNOSIS — Z79620 Long term (current) use of immunosuppressive biologic: Secondary | ICD-10-CM

## 2022-06-02 DIAGNOSIS — J449 Chronic obstructive pulmonary disease, unspecified: Secondary | ICD-10-CM | POA: Diagnosis not present

## 2022-06-02 DIAGNOSIS — M1990 Unspecified osteoarthritis, unspecified site: Secondary | ICD-10-CM | POA: Diagnosis present

## 2022-06-02 DIAGNOSIS — Z96641 Presence of right artificial hip joint: Secondary | ICD-10-CM | POA: Diagnosis present

## 2022-06-02 DIAGNOSIS — I70218 Atherosclerosis of native arteries of extremities with intermittent claudication, other extremity: Secondary | ICD-10-CM

## 2022-06-02 DIAGNOSIS — I878 Other specified disorders of veins: Secondary | ICD-10-CM

## 2022-06-02 HISTORY — DX: Depression, unspecified: F32.A

## 2022-06-02 HISTORY — DX: COVID-19: U07.1

## 2022-06-02 HISTORY — DX: Other seasonal allergic rhinitis: J30.2

## 2022-06-02 HISTORY — DX: Peripheral vascular disease, unspecified: I73.9

## 2022-06-02 HISTORY — DX: Chronic kidney disease, unspecified: N18.9

## 2022-06-02 HISTORY — PX: ENDARTERECTOMY FEMORAL: SHX5804

## 2022-06-02 LAB — TYPE AND SCREEN
ABO/RH(D): O POS
Antibody Screen: NEGATIVE

## 2022-06-02 LAB — ABO/RH: ABO/RH(D): O POS

## 2022-06-02 LAB — POCT ACTIVATED CLOTTING TIME
Activated Clotting Time: 269 seconds
Activated Clotting Time: 311 seconds
Activated Clotting Time: 155 seconds

## 2022-06-02 LAB — CBC
HCT: 32.4 % — ABNORMAL LOW (ref 36.0–46.0)
Hemoglobin: 10.4 g/dL — ABNORMAL LOW (ref 12.0–15.0)
MCH: 31.5 pg (ref 26.0–34.0)
MCHC: 32.1 g/dL (ref 30.0–36.0)
MCV: 98.2 fL (ref 80.0–100.0)
Platelets: 182 10*3/uL (ref 150–400)
RBC: 3.3 MIL/uL — ABNORMAL LOW (ref 3.87–5.11)
RDW: 13.8 % (ref 11.5–15.5)
WBC: 7.3 10*3/uL (ref 4.0–10.5)
nRBC: 0 % (ref 0.0–0.2)

## 2022-06-02 LAB — APTT: aPTT: 30 seconds (ref 24–36)

## 2022-06-02 LAB — PROTIME-INR
INR: 1 (ref 0.8–1.2)
Prothrombin Time: 12.8 seconds (ref 11.4–15.2)

## 2022-06-02 LAB — CREATININE, SERUM
Creatinine, Ser: 1.25 mg/dL — ABNORMAL HIGH (ref 0.44–1.00)
GFR, Estimated: 44 mL/min — ABNORMAL LOW (ref >60)

## 2022-06-02 LAB — SURGICAL PCR SCREEN
MRSA, PCR: NEGATIVE
Staphylococcus aureus: NEGATIVE

## 2022-06-02 SURGERY — ENDARTERECTOMY, FEMORAL
Anesthesia: General | Site: Groin | Laterality: Left

## 2022-06-02 MED ORDER — METOPROLOL TARTRATE 5 MG/5ML IV SOLN: 2.0000 mg | INTRAVENOUS | Status: DC | PRN

## 2022-06-02 MED ORDER — MIDAZOLAM HCL 2 MG/2ML IJ SOLN
INTRAMUSCULAR | Status: AC
  Filled 2022-06-02: qty 2

## 2022-06-02 MED ORDER — COQ-10 100 MG PO CAPS: 100.0000 mg | ORAL_CAPSULE | Freq: Every day | ORAL | Status: DC

## 2022-06-02 MED ORDER — LABETALOL HCL 5 MG/ML IV SOLN: 10.0000 mg | INTRAVENOUS | Status: DC | PRN

## 2022-06-02 MED ORDER — PROPOFOL 10 MG/ML IV BOLUS
INTRAVENOUS | Status: DC | PRN
  Administered 2022-06-02: 120 mg via INTRAVENOUS
  Administered 2022-06-02: 50 mg via INTRAVENOUS
  Administered 2022-06-02: 30 mg via INTRAVENOUS

## 2022-06-02 MED ORDER — PHENYLEPHRINE 80 MCG/ML (10ML) SYRINGE FOR IV PUSH (FOR BLOOD PRESSURE SUPPORT)
PREFILLED_SYRINGE | INTRAVENOUS | Status: AC
  Filled 2022-06-02: qty 10

## 2022-06-02 MED ORDER — BUPROPION HCL ER (XL) 150 MG PO TB24: 150.0000 mg | ORAL_TABLET | Freq: Every day | ORAL | Status: DC

## 2022-06-02 MED ORDER — PANTOPRAZOLE SODIUM 40 MG PO TBEC: 40.0000 mg | DELAYED_RELEASE_TABLET | Freq: Every day | ORAL | Status: DC

## 2022-06-02 MED ORDER — PHENYLEPHRINE HCL-NACL 20-0.9 MG/250ML-% IV SOLN
INTRAVENOUS | Status: DC | PRN
  Administered 2022-06-02: 15 ug/min via INTRAVENOUS

## 2022-06-02 MED ORDER — POTASSIUM CHLORIDE CRYS ER 20 MEQ PO TBCR: 20.0000 meq | EXTENDED_RELEASE_TABLET | Freq: Every day | ORAL | Status: DC | PRN

## 2022-06-02 MED ORDER — ASPIRIN 81 MG PO TBEC
81.0000 mg | DELAYED_RELEASE_TABLET | Freq: Every day | ORAL | Status: DC
  Administered 2022-06-03: 81 mg via ORAL
  Filled 2022-06-02: qty 1

## 2022-06-02 MED ORDER — CHLORHEXIDINE GLUCONATE CLOTH 2 % EX PADS: 6.0000 | MEDICATED_PAD | Freq: Once | CUTANEOUS | Status: DC

## 2022-06-02 MED ORDER — CHLORHEXIDINE GLUCONATE 0.12 % MT SOLN: 15.0000 mL | Freq: Once | OROMUCOSAL | Status: AC

## 2022-06-02 MED ORDER — PHENYLEPHRINE 80 MCG/ML (10ML) SYRINGE FOR IV PUSH (FOR BLOOD PRESSURE SUPPORT)
PREFILLED_SYRINGE | INTRAVENOUS | Status: DC | PRN
  Administered 2022-06-02 (×2): 80 ug via INTRAVENOUS
  Administered 2022-06-02: 40 ug via INTRAVENOUS
  Administered 2022-06-02: 80 ug via INTRAVENOUS

## 2022-06-02 MED ORDER — SODIUM CHLORIDE 0.9 % IV SOLN: 500.0000 mL | Freq: Once | INTRAVENOUS | Status: DC | PRN

## 2022-06-02 MED ORDER — PHENOL 1.4 % MT LIQD: 1.0000 | OROMUCOSAL | Status: DC | PRN

## 2022-06-02 MED ORDER — FENTANYL CITRATE (PF) 250 MCG/5ML IJ SOLN
INTRAMUSCULAR | Status: DC | PRN
  Administered 2022-06-02 (×4): 50 ug via INTRAVENOUS

## 2022-06-02 MED ORDER — LEFLUNOMIDE 10 MG PO TABS
20.0000 mg | ORAL_TABLET | Freq: Every day | ORAL | Status: DC
  Administered 2022-06-03: 20 mg via ORAL
  Filled 2022-06-02 (×2): qty 2
  Filled 2022-06-02: qty 1

## 2022-06-02 MED ORDER — HEPARIN SODIUM (PORCINE) 1000 UNIT/ML IJ SOLN
INTRAMUSCULAR | Status: DC | PRN
  Administered 2022-06-02: 7000 [IU] via INTRAVENOUS

## 2022-06-02 MED ORDER — HYDROCODONE-ACETAMINOPHEN 10-325 MG PO TABS
1.0000 | ORAL_TABLET | ORAL | Status: DC | PRN
  Administered 2022-06-03: 1 via ORAL
  Filled 2022-06-02: qty 2

## 2022-06-02 MED ORDER — ALUM & MAG HYDROXIDE-SIMETH 200-200-20 MG/5ML PO SUSP: 15.0000 mL | ORAL | Status: DC | PRN

## 2022-06-02 MED ORDER — KETOROLAC TROMETHAMINE 15 MG/ML IJ SOLN
INTRAMUSCULAR | Status: AC
  Administered 2022-06-02: 15 mg
  Filled 2022-06-02: qty 1

## 2022-06-02 MED ORDER — HYDRALAZINE HCL 20 MG/ML IJ SOLN: 5.0000 mg | INTRAMUSCULAR | Status: DC | PRN

## 2022-06-02 MED ORDER — FENTANYL CITRATE (PF) 100 MCG/2ML IJ SOLN
INTRAMUSCULAR | Status: AC
  Filled 2022-06-02: qty 2

## 2022-06-02 MED ORDER — LACTATED RINGERS IV SOLN: INTRAVENOUS | Status: DC

## 2022-06-02 MED ORDER — ACETAMINOPHEN 10 MG/ML IV SOLN
INTRAVENOUS | Status: AC
  Filled 2022-06-02: qty 100

## 2022-06-02 MED ORDER — ALBUTEROL SULFATE HFA 108 (90 BASE) MCG/ACT IN AERS: 2.0000 | INHALATION_SPRAY | RESPIRATORY_TRACT | Status: DC | PRN

## 2022-06-02 MED ORDER — SODIUM CHLORIDE 0.9 % IV SOLN: INTRAVENOUS | Status: DC

## 2022-06-02 MED ORDER — HEPARIN SODIUM (PORCINE) 5000 UNIT/ML IJ SOLN
5000.0000 [IU] | Freq: Three times a day (TID) | INTRAMUSCULAR | Status: DC
  Administered 2022-06-02 – 2022-06-03 (×2): 5000 [IU] via SUBCUTANEOUS
  Filled 2022-06-02 (×2): qty 1

## 2022-06-02 MED ORDER — ROCURONIUM BROMIDE 10 MG/ML (PF) SYRINGE
PREFILLED_SYRINGE | INTRAVENOUS | Status: AC
  Filled 2022-06-02: qty 10

## 2022-06-02 MED ORDER — AMLODIPINE BESYLATE 5 MG PO TABS
5.0000 mg | ORAL_TABLET | Freq: Every day | ORAL | Status: DC
  Administered 2022-06-03: 5 mg via ORAL
  Filled 2022-06-02: qty 1

## 2022-06-02 MED ORDER — CEFAZOLIN SODIUM-DEXTROSE 2-4 GM/100ML-% IV SOLN
INTRAVENOUS | Status: AC
  Filled 2022-06-02: qty 100

## 2022-06-02 MED ORDER — TRIAMTERENE-HCTZ 37.5-25 MG PO TABS
0.5000 | ORAL_TABLET | Freq: Every day | ORAL | Status: DC
  Administered 2022-06-03: 0.5 via ORAL
  Filled 2022-06-02: qty 0.5

## 2022-06-02 MED ORDER — ACETAMINOPHEN 325 MG PO TABS: 325.0000 mg | ORAL_TABLET | ORAL | Status: DC | PRN

## 2022-06-02 MED ORDER — ACETAMINOPHEN 10 MG/ML IV SOLN
INTRAVENOUS | Status: DC | PRN
  Administered 2022-06-02: 1000 mg via INTRAVENOUS

## 2022-06-02 MED ORDER — KETOROLAC TROMETHAMINE 30 MG/ML IJ SOLN: 15.0000 mg | Freq: Once | INTRAMUSCULAR | Status: DC | PRN

## 2022-06-02 MED ORDER — PROTAMINE SULFATE 10 MG/ML IV SOLN
INTRAVENOUS | Status: DC | PRN
  Administered 2022-06-02: 50 mg via INTRAVENOUS

## 2022-06-02 MED ORDER — ROCURONIUM BROMIDE 10 MG/ML (PF) SYRINGE
PREFILLED_SYRINGE | INTRAVENOUS | Status: DC | PRN
  Administered 2022-06-02: 80 mg via INTRAVENOUS
  Administered 2022-06-02: 20 mg via INTRAVENOUS

## 2022-06-02 MED ORDER — LIDOCAINE 2% (20 MG/ML) 5 ML SYRINGE
INTRAMUSCULAR | Status: DC | PRN
  Administered 2022-06-02: 100 mg via INTRAVENOUS

## 2022-06-02 MED ORDER — BUPROPION HCL ER (XL) 150 MG PO TB24
450.0000 mg | ORAL_TABLET | Freq: Every day | ORAL | Status: DC
  Administered 2022-06-03: 450 mg via ORAL
  Filled 2022-06-02: qty 3

## 2022-06-02 MED ORDER — CHLORHEXIDINE GLUCONATE 0.12 % MT SOLN
OROMUCOSAL | Status: AC
  Administered 2022-06-02: 15 mL via OROMUCOSAL
  Filled 2022-06-02: qty 15

## 2022-06-02 MED ORDER — HYDROXYCHLOROQUINE SULFATE 200 MG PO TABS
200.0000 mg | ORAL_TABLET | Freq: Every day | ORAL | Status: DC
  Administered 2022-06-02 – 2022-06-03 (×2): 200 mg via ORAL
  Filled 2022-06-02 (×2): qty 1

## 2022-06-02 MED ORDER — FENTANYL CITRATE (PF) 250 MCG/5ML IJ SOLN
INTRAMUSCULAR | Status: AC
  Filled 2022-06-02: qty 5

## 2022-06-02 MED ORDER — ROSUVASTATIN CALCIUM 5 MG PO TABS
5.0000 mg | ORAL_TABLET | Freq: Every day | ORAL | Status: DC
  Administered 2022-06-03: 5 mg via ORAL
  Filled 2022-06-02: qty 1

## 2022-06-02 MED ORDER — DOCUSATE SODIUM 100 MG PO CAPS
100.0000 mg | ORAL_CAPSULE | Freq: Every day | ORAL | Status: DC
  Administered 2022-06-03: 100 mg via ORAL
  Filled 2022-06-02: qty 1

## 2022-06-02 MED ORDER — DULOXETINE HCL 20 MG PO CPEP
20.0000 mg | ORAL_CAPSULE | Freq: Every day | ORAL | Status: DC
  Administered 2022-06-03: 20 mg via ORAL
  Filled 2022-06-02: qty 1

## 2022-06-02 MED ORDER — DEXAMETHASONE SODIUM PHOSPHATE 10 MG/ML IJ SOLN
INTRAMUSCULAR | Status: DC | PRN
  Administered 2022-06-02: 5 mg via INTRAVENOUS

## 2022-06-02 MED ORDER — OXYCODONE HCL 5 MG/5ML PO SOLN: 5.0000 mg | Freq: Once | ORAL | Status: DC | PRN

## 2022-06-02 MED ORDER — ONDANSETRON HCL 4 MG/2ML IJ SOLN: 4.0000 mg | Freq: Once | INTRAMUSCULAR | Status: DC | PRN

## 2022-06-02 MED ORDER — HEPARIN 6000 UNIT IRRIGATION SOLUTION
Status: DC | PRN
  Administered 2022-06-02: 1

## 2022-06-02 MED ORDER — ALBUTEROL SULFATE (2.5 MG/3ML) 0.083% IN NEBU: 2.5000 mg | INHALATION_SOLUTION | RESPIRATORY_TRACT | Status: DC | PRN

## 2022-06-02 MED ORDER — LOSARTAN POTASSIUM 25 MG PO TABS
25.0000 mg | ORAL_TABLET | Freq: Every day | ORAL | Status: DC
  Administered 2022-06-03: 25 mg via ORAL
  Filled 2022-06-02: qty 1

## 2022-06-02 MED ORDER — CEFAZOLIN SODIUM-DEXTROSE 2-4 GM/100ML-% IV SOLN
2.0000 g | Freq: Two times a day (BID) | INTRAVENOUS | Status: AC
  Administered 2022-06-02: 2 g via INTRAVENOUS
  Filled 2022-06-02: qty 100

## 2022-06-02 MED ORDER — MAGNESIUM SULFATE 2 GM/50ML IV SOLN: 2.0000 g | Freq: Every day | INTRAVENOUS | Status: DC | PRN

## 2022-06-02 MED ORDER — ONDANSETRON HCL 4 MG/2ML IJ SOLN: 4.0000 mg | Freq: Four times a day (QID) | INTRAMUSCULAR | Status: DC | PRN

## 2022-06-02 MED ORDER — BISOPROLOL FUMARATE 5 MG PO TABS
5.0000 mg | ORAL_TABLET | Freq: Every day | ORAL | Status: DC
  Administered 2022-06-03: 5 mg via ORAL
  Filled 2022-06-02: qty 1

## 2022-06-02 MED ORDER — HYDROMORPHONE HCL 1 MG/ML IJ SOLN: 0.5000 mg | INTRAMUSCULAR | Status: DC | PRN

## 2022-06-02 MED ORDER — OXYCODONE HCL 5 MG PO TABS: 5.0000 mg | ORAL_TABLET | Freq: Once | ORAL | Status: DC | PRN

## 2022-06-02 MED ORDER — HEPARIN 6000 UNIT IRRIGATION SOLUTION
Status: AC
  Filled 2022-06-02: qty 500

## 2022-06-02 MED ORDER — LIDOCAINE 2% (20 MG/ML) 5 ML SYRINGE
INTRAMUSCULAR | Status: AC
  Filled 2022-06-02: qty 5

## 2022-06-02 MED ORDER — BISACODYL 5 MG PO TBEC: 5.0000 mg | DELAYED_RELEASE_TABLET | Freq: Every day | ORAL | Status: DC | PRN

## 2022-06-02 MED ORDER — ONDANSETRON HCL 4 MG/2ML IJ SOLN
INTRAMUSCULAR | Status: DC | PRN
  Administered 2022-06-02: 4 mg via INTRAVENOUS

## 2022-06-02 MED ORDER — FAMOTIDINE 20 MG PO TABS
20.0000 mg | ORAL_TABLET | Freq: Two times a day (BID) | ORAL | Status: DC
  Administered 2022-06-02 – 2022-06-03 (×2): 20 mg via ORAL
  Filled 2022-06-02 (×2): qty 1

## 2022-06-02 MED ORDER — SUGAMMADEX SODIUM 200 MG/2ML IV SOLN
INTRAVENOUS | Status: DC | PRN
  Administered 2022-06-02: 150 mg via INTRAVENOUS

## 2022-06-02 MED ORDER — CEFAZOLIN SODIUM-DEXTROSE 2-4 GM/100ML-% IV SOLN
2.0000 g | INTRAVENOUS | Status: AC
  Administered 2022-06-02: 2 g via INTRAVENOUS

## 2022-06-02 MED ORDER — SENNOSIDES-DOCUSATE SODIUM 8.6-50 MG PO TABS: 1.0000 | ORAL_TABLET | Freq: Every evening | ORAL | Status: DC | PRN

## 2022-06-02 MED ORDER — ACETAMINOPHEN 650 MG RE SUPP: 325.0000 mg | RECTAL | Status: DC | PRN

## 2022-06-02 MED ORDER — GUAIFENESIN-DM 100-10 MG/5ML PO SYRP: 15.0000 mL | ORAL_SOLUTION | ORAL | Status: DC | PRN

## 2022-06-02 MED ORDER — CLOPIDOGREL BISULFATE 75 MG PO TABS
75.0000 mg | ORAL_TABLET | Freq: Every day | ORAL | Status: DC
  Administered 2022-06-02 – 2022-06-03 (×2): 75 mg via ORAL
  Filled 2022-06-02 (×2): qty 1

## 2022-06-02 MED ORDER — ORAL CARE MOUTH RINSE: 15.0000 mL | Freq: Once | OROMUCOSAL | Status: AC

## 2022-06-02 MED ORDER — 0.9 % SODIUM CHLORIDE (POUR BTL) OPTIME
TOPICAL | Status: DC | PRN
  Administered 2022-06-02: 1000 mL

## 2022-06-02 MED ORDER — FENTANYL CITRATE (PF) 100 MCG/2ML IJ SOLN
25.0000 ug | INTRAMUSCULAR | Status: DC | PRN
  Administered 2022-06-02: 25 ug via INTRAVENOUS

## 2022-06-02 MED ORDER — HEMOSTATIC AGENTS (NO CHARGE) OPTIME
TOPICAL | Status: DC | PRN
  Administered 2022-06-02: 1 via TOPICAL

## 2022-06-02 SURGICAL SUPPLY — 51 items
ADH SKN CLS APL DERMABOND .7 (GAUZE/BANDAGES/DRESSINGS) ×1
ADH SKN CLS LQ APL DERMABOND (GAUZE/BANDAGES/DRESSINGS) ×1
AGENT HMST KT MTR STRL THRMB (HEMOSTASIS) ×1
BAG COUNTER SPONGE SURGICOUNT (BAG) ×1 IMPLANT
BAG SPNG CNTER NS LX DISP (BAG)
CANISTER SUCT 3000ML PPV (MISCELLANEOUS) ×1 IMPLANT
CATH EMB 4FR 40CM (CATHETERS) IMPLANT
CLIP VESOCCLUDE MED 24/CT (CLIP) ×1 IMPLANT
CLIP VESOCCLUDE SM WIDE 24/CT (CLIP) ×1 IMPLANT
COVER PROBE W GEL 5X96 (DRAPES) IMPLANT
DERMABOND ADVANCED .7 DNX12 (GAUZE/BANDAGES/DRESSINGS) ×1 IMPLANT
DERMABOND ADVANCED .7 DNX6 (GAUZE/BANDAGES/DRESSINGS) IMPLANT
DRAIN CHANNEL 15F RND FF W/TCR (WOUND CARE) IMPLANT
DRAPE X-RAY CASS 24X20 (DRAPES) IMPLANT
ELECT REM PT RETURN 9FT ADLT (ELECTROSURGICAL) ×1
ELECTRODE REM PT RTRN 9FT ADLT (ELECTROSURGICAL) ×1 IMPLANT
EVACUATOR SILICONE 100CC (DRAIN) IMPLANT
GLOVE BIOGEL PI IND STRL 7.5 (GLOVE) IMPLANT
GLOVE SURG SS PI 7.5 STRL IVOR (GLOVE) ×3 IMPLANT
GOWN STRL REUS W/ TWL LRG LVL3 (GOWN DISPOSABLE) ×2 IMPLANT
GOWN STRL REUS W/ TWL XL LVL3 (GOWN DISPOSABLE) ×1 IMPLANT
GOWN STRL REUS W/TWL LRG LVL3 (GOWN DISPOSABLE) ×2
GOWN STRL REUS W/TWL XL LVL3 (GOWN DISPOSABLE) ×1
HEMOSTAT SNOW SURGICEL 2X4 (HEMOSTASIS) IMPLANT
KIT BASIN OR (CUSTOM PROCEDURE TRAY) ×1 IMPLANT
KIT TURNOVER KIT B (KITS) ×1 IMPLANT
NS IRRIG 1000ML POUR BTL (IV SOLUTION) ×2 IMPLANT
PACK PERIPHERAL VASCULAR (CUSTOM PROCEDURE TRAY) ×1 IMPLANT
PAD ARMBOARD 7.5X6 YLW CONV (MISCELLANEOUS) ×2 IMPLANT
PATCH VASC XENOSURE 1CMX6CM (Vascular Products) ×1 IMPLANT
PATCH VASC XENOSURE 1X6 (Vascular Products) IMPLANT
SET COLLECT BLD 21X3/4 12 (NEEDLE) IMPLANT
SET WALTER ACTIVATION W/DRAPE (SET/KITS/TRAYS/PACK) IMPLANT
SPONGE INTESTINAL PEANUT (DISPOSABLE) ×1 IMPLANT
STOPCOCK 4 WAY LG BORE MALE ST (IV SETS) IMPLANT
SURGIFLO W/THROMBIN 8M KIT (HEMOSTASIS) IMPLANT
SUT ETHILON 3 0 PS 1 (SUTURE) IMPLANT
SUT PROLENE 5 0 C 1 24 (SUTURE) ×1 IMPLANT
SUT PROLENE 5 0 C 1 36 (SUTURE) IMPLANT
SUT PROLENE 6 0 BV (SUTURE) ×1 IMPLANT
SUT PROLENE 7 0 BV1 MDA (SUTURE) IMPLANT
SUT VIC AB 2-0 CT1 27 (SUTURE) ×1
SUT VIC AB 2-0 CT1 TAPERPNT 27 (SUTURE) ×1 IMPLANT
SUT VIC AB 3-0 SH 27 (SUTURE) ×1
SUT VIC AB 3-0 SH 27X BRD (SUTURE) ×1 IMPLANT
SUT VIC AB 3-0 X1 27 (SUTURE) ×1 IMPLANT
SYR 3ML LL SCALE MARK (SYRINGE) IMPLANT
TOWEL GREEN STERILE (TOWEL DISPOSABLE) ×1 IMPLANT
TUBING EXTENTION W/L.L. (IV SETS) IMPLANT
UNDERPAD 30X36 HEAVY ABSORB (UNDERPADS AND DIAPERS) ×1 IMPLANT
WATER STERILE IRR 1000ML POUR (IV SOLUTION) ×1 IMPLANT

## 2022-06-02 NOTE — Anesthesia Preprocedure Evaluation (Signed)
Anesthesia Evaluation  Patient identified by MRN, date of birth, ID band Patient awake    Reviewed: Allergy & Precautions, H&P , NPO status , Patient's Chart, lab work & pertinent test results  Airway Mallampati: II  TM Distance: >3 FB Neck ROM: Full    Dental no notable dental hx.    Pulmonary COPD,  COPD inhaler, former smoker   Pulmonary exam normal breath sounds clear to auscultation       Cardiovascular hypertension, Pt. on medications + Peripheral Vascular Disease  Normal cardiovascular exam Rhythm:Regular Rate:Normal     Neuro/Psych    Depression    negative neurological ROS     GI/Hepatic Neg liver ROS,GERD  ,,  Endo/Other  negative endocrine ROS    Renal/GU negative Renal ROS  negative genitourinary   Musculoskeletal  (+) Arthritis , Rheumatoid disorders,    Abdominal   Peds negative pediatric ROS (+)  Hematology negative hematology ROS (+)   Anesthesia Other Findings   Reproductive/Obstetrics negative OB ROS                             Anesthesia Physical Anesthesia Plan  ASA: 3  Anesthesia Plan: General   Post-op Pain Management: Ofirmev IV (intra-op)*   Induction: Intravenous  PONV Risk Score and Plan: 3 and Ondansetron, Dexamethasone and Treatment may vary due to age or medical condition  Airway Management Planned: Oral ETT  Additional Equipment:   Intra-op Plan:   Post-operative Plan: Extubation in OR  Informed Consent: I have reviewed the patients History and Physical, chart, labs and discussed the procedure including the risks, benefits and alternatives for the proposed anesthesia with the patient or authorized representative who has indicated his/her understanding and acceptance.     Dental advisory given  Plan Discussed with: CRNA and Surgeon  Anesthesia Plan Comments:        Anesthesia Quick Evaluation

## 2022-06-02 NOTE — Anesthesia Procedure Notes (Signed)
Arterial Line Insertion Start/End11/09/2021 11:00 AM, 06/02/2022 11:05 AM Performed by: Betha Loa, CRNA, CRNA  Patient location: Pre-op. Preanesthetic checklist: patient identified, IV checked, site marked, risks and benefits discussed, surgical consent, monitors and equipment checked, pre-op evaluation, timeout performed and anesthesia consent Lidocaine 1% used for infiltration Left, radial was placed Catheter size: 20 G Hand hygiene performed , maximum sterile barriers used  and Seldinger technique used Allen's test indicative of satisfactory collateral circulation Attempts: 2 Procedure performed without using ultrasound guided technique. Following insertion, dressing applied and Biopatch. Post procedure assessment: normal and unchanged  Patient tolerated the procedure well with no immediate complications.

## 2022-06-02 NOTE — Anesthesia Procedure Notes (Signed)
Procedure Name: Intubation Date/Time: 06/02/2022 11:49 AM  Performed by: Colin Benton, CRNAPre-anesthesia Checklist: Patient identified, Emergency Drugs available, Suction available and Patient being monitored Patient Re-evaluated:Patient Re-evaluated prior to induction Oxygen Delivery Method: Circle system utilized Preoxygenation: Pre-oxygenation with 100% oxygen Induction Type: IV induction Ventilation: Mask ventilation without difficulty Laryngoscope Size: Mac and 4 Grade View: Grade I Tube type: Oral Tube size: 7.0 mm Number of attempts: 2 Airway Equipment and Method: Stylet Placement Confirmation: ETT inserted through vocal cords under direct vision, positive ETCO2 and breath sounds checked- equal and bilateral Secured at: 22 cm Tube secured with: Tape Dental Injury: Teeth and Oropharynx as per pre-operative assessment  Comments: DL x 1 with Mil 2 by CRNA and grade 3 view.  MAC 4 used by Dr. Kalman Shan.  Successful intubation with EBBS and VSS.

## 2022-06-02 NOTE — Transfer of Care (Signed)
Immediate Anesthesia Transfer of Care Note  Patient: Donna [name]  Procedure(s) Performed: LEFT ENDARTERECTOMY FEMORAL with BOVINE PATCH (Left: Groin)  Patient Location: PACU  Anesthesia Type:General  Level of Consciousness: drowsy and patient cooperative  Airway & Oxygen Therapy: Patient Spontanous Breathing and Patient connected to nasal cannula oxygen  Post-op Assessment: Report given to RN and Post -op Vital signs reviewed and stable  Post vital signs: Reviewed and stable  Last Vitals:  Vitals Value Taken Time  BP 115/49 06/02/22 1405  Temp    Pulse 78 06/02/22 1410  Resp 16 06/02/22 1410  SpO2 94 % 06/02/22 1410  Vitals shown include unvalidated device data.  Last Pain:  Vitals:   06/02/22 1112  TempSrc:   PainSc: 0-No pain         Complications: No notable events documented.

## 2022-06-02 NOTE — Op Note (Signed)
Patient name: Donna [name] MRN: [id number] DOB: 04-26-1946 Sex: female  06/02/2022 Pre-operative Diagnosis: Left leg claudication Post-operative diagnosis:  Same Surgeon:  [name] Assistants:  Laurence Slate, PA Procedure:   Left femoral endarterectomy with bovine patch angioplasty Anesthesia:  General Blood Loss:  200 cc Specimens:  None  Findings:  Extensive plaque from the inguinal ligament down in to the SFA and profunda.  The patch extends onto the SFA for 1.5 cm.   Eversion endarterectomy of the profunda  Indications:  This is a 76 yo femoral with severe left leg claudication with extensive common femoral disease on the left.  She comes in today for left femoral endarterectomy.    Procedure:  The patient was identified in the holding area and taken to [facility]  The patient was then placed supine on the table. general anesthesia was administered.  The patient was prepped and draped in the usual sterile fashion.  A time out was called and antibiotics were administered.  A PA was necessary to expedite the procedure and assist with technical details.  She provided suction and retraction for exposure.  She helped follow the suture for the anastomosis, as well as assisting with wound closure.  Ultrasound was used to evaluate the common femoral artery and locate the profunda origin.  I made a oblique incision above the inguinal crease with a 10 blade.  Cautery was used to divide subcutaneous tissue down to the femoral sheath which was opened sharply.  I exposed the common femoral artery from the inguinal ligament down to the bifurcation.  I dissected out the superficial femoral artery for approximately 3 cm in the profundofemoral artery down to his primary branch point.  The common femoral and proximal superficial femoral and profunda were heavily calcified.  The artery was still diseased but soft at the level of the inguinal ligament.  The crossing circumflex veins were ligated.  Once  I had adequate exposure, the patient was fully heparinized.  After the heparin circulated, the distal external iliac artery was occluded with a vascular clamp followed by occlusion of the profunda and superficial femoral artery.  A #11 blade was used to make an arteriotomy which was extended longitudinally with Potts scissors.  The arteriotomy went down onto the superficial femoral artery for approximately 1.5 cm.  A Marlene Bast elevator was used to perform endarterectomy.  I got a good endpoint in the superficial femoral artery and tacked down the plaque with 7-0 Prolene suture.  I inserted a Fogarty balloon into the iliac artery to remove the plaque up to the distal external iliac.  An eversion endarterectomy was performed in the profundofemoral artery.  All potential embolic debris was removed.  A bovine pericardial patch was selected and patch angioplasty was performed with running 5-0 Prolene.  Prior to completion, the appropriate flushing maneuvers were performed and the anastomosis was completed.  The patient had excellent Doppler signals in the profunda and superficial femoral artery.  Several repair stitches were required for hemostasis.  The heparin was reversed with 50 mg of protamine.  Surgiflo was used to facilitate hemostasis.  The groin was irrigated.  The femoral sheath was reapproximated with 2-0 Vicryl.  The subcutaneous tissue was then closed with additional layers with 2-0 and 3-0 Vicryl followed by subcuticular closure and Dermabond application.  There were no immediate complications.  The patient was successfully extubated and taken to recovery in stable condition.   Disposition: To PACU stable.   V.  [name], M.D., [facility] [place] Office: [phone] Pager:  [phone]

## 2022-06-02 NOTE — Interval H&P Note (Signed)
History and Physical Interval Note:  06/02/2022 10:28 AM  Donna [name]  has presented today for surgery, with the diagnosis of Peripheral Artery Disease with left leg claudication.  The various methods of treatment have been discussed with the patient and family. After consideration of risks, benefits and other options for treatment, the patient has consented to  Procedure(s): LEFT ENDARTERECTOMY FEMORAL (Left) as a surgical intervention.  The patient's history has been reviewed, patient examined, no change in status, stable for surgery.  I have reviewed the patient's chart and labs.  Questions were answered to the patient's satisfaction.     [name]

## 2022-06-02 NOTE — Progress Notes (Signed)
Pt admitted to rm 12 from PACU. CHG wipe given. Initiated tele. VSS. Call bell within reach.   Lavenia Atlas, RN

## 2022-06-03 LAB — BASIC METABOLIC PANEL
Anion gap: 8 (ref 5–15)
BUN: 20 mg/dL (ref 8–23)
CO2: 23 mmol/L (ref 22–32)
Calcium: 8.9 mg/dL (ref 8.9–10.3)
Chloride: 106 mmol/L (ref 98–111)
Creatinine, Ser: 1.35 mg/dL — ABNORMAL HIGH (ref 0.44–1.00)
GFR, Estimated: 40 mL/min — ABNORMAL LOW (ref >60)
Glucose, Bld: 162 mg/dL — ABNORMAL HIGH (ref 70–99)
Potassium: 4.3 mmol/L (ref 3.5–5.1)
Sodium: 137 mmol/L (ref 135–145)

## 2022-06-03 LAB — LIPID PANEL
Cholesterol: 125 mg/dL (ref 0–200)
HDL: 64 mg/dL (ref >40)
LDL Cholesterol: 48 mg/dL (ref 0–99)
Total CHOL/HDL Ratio: 2 RATIO
Triglycerides: 64 mg/dL (ref <150)
VLDL: 13 mg/dL (ref 0–40)

## 2022-06-03 LAB — CBC
HCT: 30.3 % — ABNORMAL LOW (ref 36.0–46.0)
Hemoglobin: 9.9 g/dL — ABNORMAL LOW (ref 12.0–15.0)
MCH: 31.6 pg (ref 26.0–34.0)
MCHC: 32.7 g/dL (ref 30.0–36.0)
MCV: 96.8 fL (ref 80.0–100.0)
Platelets: 187 10*3/uL (ref 150–400)
RBC: 3.13 MIL/uL — ABNORMAL LOW (ref 3.87–5.11)
RDW: 13.8 % (ref 11.5–15.5)
WBC: 6.1 10*3/uL (ref 4.0–10.5)
nRBC: 0 % (ref 0.0–0.2)

## 2022-06-03 MED ORDER — HYDROCODONE-ACETAMINOPHEN 10-325 MG PO TABS: 1.0000 | ORAL_TABLET | Freq: Three times a day (TID) | ORAL | 0 refills | Status: DC | PRN

## 2022-06-03 NOTE — Evaluation (Signed)
Physical Therapy One time Evaluation Patient Details Name: Donna [name] MRN: [id number] DOB: 05/22/1946 Today's Date: 06/03/2022  History of Present Illness  Pt is a 76 y/o F s/p L femoral endarterectomy with bovine patch angioplasty. PMH includes arthritis, COPD, GERD, HTN, back surgery, R THA, and cervical spine surgery.  Clinical Impression  Pt admitted with above diagnosis. Pt was able to ambulate with RW and incr distance to door  and back to chair with min guard assist. Pt going home after this treatment therefore did not set goals. Husband will assist pt a home and equipment and f/u as below.  Pt currently with functional limitations due to the deficits listed below (see PT Problem List). Pt will benefit from skilled PT in the home.     Recommendations for follow up therapy are one component of a multi-disciplinary discharge planning process, led by the attending physician.  Recommendations may be updated based on patient status, additional functional criteria and insurance authorization.  Follow Up Recommendations Home health PT      Assistance Recommended at Discharge Intermittent Supervision/Assistance  Patient can return home with the following  A little help with walking and/or transfers;A little help with bathing/dressing/bathroom;Assistance with cooking/housework;Assist for transportation;Help with stairs or ramp for entrance    Equipment Recommendations Rolling walker (2 wheels)  Recommendations for Other Services       Functional Status Assessment Patient has had a recent decline in their functional status and demonstrates the ability to make significant improvements in function in a reasonable and predictable amount of time.     Precautions / Restrictions Precautions Precautions: Fall Restrictions Weight Bearing Restrictions: No      Mobility  Bed Mobility Overal bed mobility: Needs Assistance Bed Mobility: Sidelying to Sit   Sidelying to sit: Supervision             Transfers Overall transfer level: Needs assistance Equipment used: Rolling walker (2 wheels) Transfers: Sit to/from Stand Sit to Stand: Min guard           General transfer comment: increased pain with sit to stand, cues for hand placement and guard assist provided    Ambulation/Gait Ambulation/Gait assistance: Min guard Gait Distance (Feet): 40 Feet Assistive device: Rolling walker (2 wheels) Gait Pattern/deviations: Step-to pattern, Decreased stride length, Decreased stance time - left, Decreased weight shift to left, Antalgic   Gait velocity interpretation: <1.31 ft/sec, indicative of household ambulator   General Gait Details: Pt was able to ambulate with min guard assist cuing pt to unweight left LE and to sequence steps and RW correctly.  Stairs Stairs: Yes       General stair comments: Discussed technique for up and down steps with left rail.  Wheelchair Mobility    Modified Rankin (Stroke Patients Only)       Balance Overall balance assessment: Needs assistance Sitting-balance support: Feet supported Sitting balance-Leahy Scale: Fair Sitting balance - Comments: unable to reach down toward toward feet due to "pulling" sensation   Standing balance support: Bilateral upper extremity supported, During functional activity, Reliant on assistive device for balance Standing balance-Leahy Scale: Poor Standing balance comment: reliant on RW and  external support                             Pertinent Vitals/Pain Pain Assessment Pain Assessment: No/denies pain    Home Living Family/patient expects to be discharged to:: Private residence Living Arrangements: Spouse/significant other Available Help at Discharge:   Family;Available 24 hours/day Type of Home: House Home Access: Stairs to enter   [employer] of Steps: 4   Home Layout: Two level;Able to live on main level with bedroom/bathroom Home Equipment: Grab bars -  tub/shower;Shower seat - built in;Cane - single point      Prior Function Prior Level of Function : Independent/Modified Independent;Driving             Mobility Comments: reports being able to walk ~10 steps before increased pain, can stand x10 mins before pain ADLs Comments: pt reports using button hook for shirt buttons, ind with ADLs     Hand Dominance        Extremity/Trunk Assessment   Upper Extremity Assessment Upper Extremity Assessment: Defer to OT evaluation    Lower Extremity Assessment Lower Extremity Assessment: LLE deficits/detail LLE: Unable to fully assess due to pain    Cervical / Trunk Assessment Cervical / Trunk Assessment: Normal  Communication   Communication: No difficulties  Cognition Arousal/Alertness: Awake/alert Behavior During Therapy: WFL for tasks assessed/performed Overall Cognitive Status: Within Functional Limits for tasks assessed                                          General Comments General comments (skin integrity, edema, etc.): Issued gait belt, VSS on RA    Exercises     Assessment/Plan    PT Assessment All further PT needs can be met in the next venue of care  PT Problem List Decreased activity tolerance;Decreased balance;Decreased mobility;Decreased range of motion;Decreased knowledge of use of DME;Decreased safety awareness;Decreased knowledge of precautions;Pain       PT Treatment Interventions      PT Goals (Current goals can be found in the Care Plan section)  Acute Rehab PT Goals Patient Stated Goal: to go home    Frequency       Co-evaluation               AM-PAC PT "6 Clicks" Mobility  Outcome Measure Help needed turning from your back to your side while in a flat bed without using bedrails?: A Little Help needed moving from lying on your back to sitting on the side of a flat bed without using bedrails?: A Little Help needed moving to and from a bed to a chair (including a  wheelchair)?: A Little Help needed standing up from a chair using your arms (e.g., wheelchair or bedside chair)?: A Little Help needed to walk in hospital room?: A Little Help needed climbing 3-5 steps with a railing? : A Little 6 Click Score: 18    End of Session Equipment Utilized During Treatment: Gait belt Activity Tolerance: Patient tolerated treatment well Patient left: in chair;with call bell/phone within reach Nurse Communication: Mobility status PT Visit Diagnosis: Muscle weakness (generalized) (M62.81);Pain Pain - Right/Left: Left Pain - part of body: Leg    Time: 2703-5009 PT Time Calculation (min) (ACUTE ONLY): 22 min   Charges:   PT Evaluation $PT Eval Moderate Complexity: 1 Mod          Kamelia Lampkins M,PT Acute Rehab Services [phone]   Alvira Philips 06/03/2022, 1:24 PM

## 2022-06-03 NOTE — Discharge Instructions (Addendum)
[facility]  Discharge instructions  Lower Extremity/Groin Surgery  Please refer to the following instruction for your post-procedure care. Your surgeon or physician assistant will discuss any changes with you.  Activity  You are encouraged to walk as much as you can. You can slowly return to normal activities during the month after your surgery. Avoid strenuous activity and heavy lifting until your doctor tells you it's OK. Avoid activities such as vacuuming or swinging a golf club. Do not drive until your doctor give the OK and you are no longer taking prescription pain medications. It is also normal to have difficulty with sleep habits, eating and bowel movement after surgery. These will go away with time.  Bathing/Showering  Shower daily after you go home. Do not soak in a bathtub, hot tub, or swim until the incision heals completely.  Incision Care  Clean your incision with mild soap and water. Shower every day. Pat the area dry with a clean towel. You do not need a bandage unless otherwise instructed. Do not apply any ointments or creams to your incision. If you have open wounds you will be instructed how to care for them or a visiting nurse may be arranged for you. If you have staples or sutures along your incision they will be removed at your post-op appointment. You may have skin glue on your incision. Do not peel it off. It will come off on its own in about one week.  Wash the groin wound with soap and water daily and pat dry. (No tub bath-only shower)  Then put a dry gauze or washcloth in the groin to keep this area dry to help prevent wound infection.  Do this daily and as needed.  Do not use Vaseline or neosporin on your incisions.  Only use soap and water on your incisions and then protect and keep dry.  Diet  Resume your normal diet. There are no special food restrictions following this procedure. A low fat/ low cholesterol diet is  recommended for all patients with vascular disease. In order to heal from your surgery, it is CRITICAL to get adequate nutrition. Your body requires vitamins, minerals, and protein. Vegetables are the best source of vitamins and minerals. Vegetables also provide the perfect balance of protein. Processed food has little nutritional value, so try to avoid this.  Medications  Resume taking all your medications unless your doctor or physician assistant tells you not to. If your incision is causing pain, you may take over-the-counter pain relievers such as acetaminophen (Tylenol). If you were prescribed a stronger pain medication, please aware these medication can cause nausea and constipation. Prevent nausea by taking the medication with a snack or meal. Avoid constipation by drinking plenty of fluids and eating foods with high amount of fiber, such as fruits, vegetables, and grains. Take Colace 100 mg (an over-the-counter stool softener) twice a day as needed for constipation.  Do not take Tylenol if you are taking prescription pain medications.  Follow Up  Our office will schedule a follow up appointment 2-3 weeks following discharge.  Please call us immediately for any of the following conditions  Severe or worsening pain in your legs or feet while at rest or while walking Increase pain, redness, warmth, or drainage (pus) from your incision site(s) Fever of 101 degree or higher The swelling in your leg with the bypass suddenly worsens and becomes more painful than when you were in the hospital If you have been  instructed to feel your graft pulse then you should do so every day. If you can no longer feel this pulse, call the office immediately. Not all patients are given this instruction.  Leg swelling is common after leg bypass surgery.  The swelling should improve over a few months following surgery. To improve the swelling, you may elevate your legs above the level of your heart while you are  sitting or resting. Your surgeon or physician assistant may ask you to apply an ACE wrap or wear compression (TED) stockings to help to reduce swelling.  Reduce your risk of vascular disease  Stop smoking. If you would like help call [facility] at 1-800-QUIT-NOW 404-439-8215) or [facility] at (867)855-5782.  Manage your cholesterol Maintain a desired weight Control your diabetes weight Control your diabetes Keep your blood pressure down  If you have any questions, please call the office at [phone]

## 2022-06-03 NOTE — Progress Notes (Addendum)
  Progress Note    06/03/2022 7:01 AM 1 Day Post-Op  Subjective:  left foot feels much better.  Says her feet have been cold for 2-3 years and now the left one is warm.  She states there is plans for future angiogram to stick left groin and evaluate right leg with Dr. Einar Gip   Afebrile HR 70's-80's NSR 371'G-626'R systolic 48% 5IO2VO  Vitals:   06/02/22 2308 06/03/22 0306  BP: 116/66 123/63  Pulse: 80 77  Resp: 15 13  Temp: 98.5 F (36.9 C) 98.2 F (36.8 C)  SpO2: 96% 97%    Physical Exam: Cardiac:  regular Lungs:  non labored Incisions:  left groin is clean and dry without hematoma Extremities:  easily palpable 2+ left DP; left calf and anterior compartments are soft and non tender Abdomen:  soft  CBC    Component Value Date/Time   WBC 6.1 06/02/2022 2359   RBC 3.13 (L) 06/02/2022 2359   HGB 9.9 (L) 06/02/2022 2359   HGB 11.6 05/19/2022 0959   HCT 30.3 (L) 06/02/2022 2359   HCT 33.8 (L) 05/19/2022 0959   PLT 187 06/02/2022 2359   PLT 309 05/19/2022 0959   MCV 96.8 06/02/2022 2359   MCV 94 05/19/2022 0959   MCH 31.6 06/02/2022 2359   MCHC 32.7 06/02/2022 2359   RDW 13.8 06/02/2022 2359   RDW 12.8 05/19/2022 0959   LYMPHSABS 1.1 10/26/2020 1515   MONOABS 1.1 (H) 10/26/2020 1515   EOSABS 0.3 10/26/2020 1515   BASOSABS 0.1 10/26/2020 1515    BMET    Component Value Date/Time   NA 137 06/02/2022 2359   NA 140 05/19/2022 0959   K 4.3 06/02/2022 2359   CL 106 06/02/2022 2359   CO2 23 06/02/2022 2359   GLUCOSE 162 (H) 06/02/2022 2359   BUN 20 06/02/2022 2359   BUN 16 05/19/2022 0959   CREATININE 1.35 (H) 06/02/2022 2359   CALCIUM 8.9 06/02/2022 2359   GFRNONAA 40 (L) 06/02/2022 2359   GFRAA >60 10/01/2015 1029    INR    Component Value Date/Time   INR 1.0 06/02/2022 1109     Intake/Output Summary (Last 24 hours) at 06/03/2022 0701 Last data filed at 06/03/2022 3500 Gross per 24 hour  Intake 769.28 ml  Output 500 ml  Net 269.28 ml      Assessment/Plan:  76 y.o. female is s/p:   Left femoral endarterectomy with bovine patch angioplasty   1 Day Post-Op   -pt with palpable left DP pulse.  Left calf and anterior compartments are soft and non tender -acute surgical blood loss anemia-pt is tolerating -creatinine 1.35 today and was 1.31 05/19/2022 -DVT prophylaxis:  sq heparin -continue asa/statin/plavix -pt needs to ambulate and most likely discharge home later today.  She will f/u in 2-3 weeks for incision check.  I talked with her about groin wound care.  She expressed understanding.     Leontine Locket, PA-C [facility] [phone] 06/03/2022 7:01 AM  VASCULAR STAFF ADDENDUM: I have independently interviewed and examined the patient. I agree with the above.  Palpable DP pulse, groin soft, home today  Cassandria Santee, MD [facility] Phone Number: [phone] 06/03/2022 10:33 AM

## 2022-06-03 NOTE — TOC Transition Note (Addendum)
Transition of Care [facility]) - CM/SW Discharge Note   Patient Details  Name: Donna [name] MRN: [id number] Date of Birth: 03/08/1946  [facility]) CM/SW Contact:  Zenon Mayo, RN Phone Number: 06/03/2022, 9:50 AM   Clinical Narrative:    Patient is for dc today, per MD protocol set up with [facility], NCM notified Amy of dc today. NCM notified by PT Rep that they are ordering a rolling walker for patient.  NCM spoke with patient, offered choice, they have no preference, NCM made referral to [facility] with [employer].  They will deliver the walker to the patient's home.           Patient Goals and CMS Choice        Discharge Placement                       Discharge Plan and Services                                     Social Determinants of Health (SDOH) Interventions     Readmission Risk Interventions     No data to display

## 2022-06-03 NOTE — Evaluation (Signed)
Occupational Therapy Evaluation Patient Details Name: Donna [name] MRN: [id number] DOB: 1946/05/21 Today's Date: 06/03/2022   History of Present Illness Pt is a 76 y/o F s/p L femoral endarterectomy with bovine patch angioplasty. PMH includes arthritis, COPD, GERD, HTN, back surgery, R THA, and cervical spine surgery.   Clinical Impression   Pt reports independence at baseline with ADLs and functional mobility, although reports increasing difficulty due to BLE pain. Pt lives with spouse who can provide 24/7 assist at d/c. Pt currently needing min guard-mod A for ADLs, supervision for bed mobility, and min guard for transfers with RW. Pt needing x2 seated rest breaks with short distance ambulation around bed to chair. Pt presenting with impairments listed below, will follow acutely. Recommend HHOT at d/c pending progression.     Recommendations for follow up therapy are one component of a multi-disciplinary discharge planning process, led by the attending physician.  Recommendations may be updated based on patient status, additional functional criteria and insurance authorization.   Follow Up Recommendations  Home health OT    Assistance Recommended at Discharge Intermittent Supervision/Assistance  Patient can return home with the following A little help with walking and/or transfers;A lot of help with bathing/dressing/bathroom;Assistance with cooking/housework;Direct supervision/assist for financial management;Direct supervision/assist for medications management;Assist for transportation;Help with stairs or ramp for entrance    Functional Status Assessment  Patient has had a recent decline in their functional status and demonstrates the ability to make significant improvements in function in a reasonable and predictable amount of time.  Equipment Recommendations  BSC/3in1;Other (comment) (RW)    Recommendations for Other Services PT consult     Precautions / Restrictions  Precautions Precautions: Fall Restrictions Weight Bearing Restrictions: No      Mobility Bed Mobility Overal bed mobility: Needs Assistance Bed Mobility: Sidelying to Sit   Sidelying to sit: Supervision            Transfers Overall transfer level: Needs assistance Equipment used: Rolling walker (2 wheels) Transfers: Sit to/from Stand Sit to Stand: Min guard           General transfer comment: increased pain, x2 seated rest breaks needed with short distance ambulation      Balance Overall balance assessment: Needs assistance Sitting-balance support: Feet supported Sitting balance-Leahy Scale: Fair Sitting balance - Comments: unable to reach down toward toward feet due to "pulling" sensation   Standing balance support: Bilateral upper extremity supported, During functional activity, Reliant on assistive device for balance Standing balance-Leahy Scale: Poor Standing balance comment: reliant on external support                           ADL either performed or assessed with clinical judgement   ADL Overall ADL's : Needs assistance/impaired Eating/Feeding: Modified independent   Grooming: Modified independent;Sitting   Upper Body Bathing: Min guard   Lower Body Bathing: Moderate assistance;Maximal assistance   Upper Body Dressing : Min guard   Lower Body Dressing: Moderate assistance;Maximal assistance   Toilet Transfer: Min guard;BSC/3in1;Ambulation;Rolling walker (2 wheels)   Toileting- Clothing Manipulation and Hygiene: Min guard       Functional mobility during ADLs: Min guard;Rolling walker (2 wheels)       Vision   Vision Assessment?: No apparent visual deficits     Perception     Praxis      Pertinent Vitals/Pain Pain Assessment Pain Assessment: No/denies pain     Hand Dominance     Extremity/Trunk  Assessment Upper Extremity Assessment Upper Extremity Assessment: Overall WFL for tasks assessed   Lower Extremity  Assessment Lower Extremity Assessment: Defer to PT evaluation   Cervical / Trunk Assessment Cervical / Trunk Assessment: Normal   Communication Communication Communication: No difficulties   Cognition Arousal/Alertness: Awake/alert Behavior During Therapy: WFL for tasks assessed/performed Overall Cognitive Status: Within Functional Limits for tasks assessed                                       General Comments  VSS on RA    Exercises     Shoulder Instructions      Home Living Family/patient expects to be discharged to:: Private residence Living Arrangements: Spouse/significant other Available Help at Discharge: Family;Available 24 hours/day Type of Home: House Home Access: Stairs to enter [employer] of Steps: 4   Home Layout: Two level;Able to live on main level with bedroom/bathroom     Bathroom Shower/Tub: Walk-in shower         Home Equipment: Grab bars - tub/shower;Shower seat - built in;Cane - single point          Prior Functioning/Environment Prior Level of Function : Independent/Modified Independent;Driving             Mobility Comments: reports being able to walk ~10 steps before increased pain, can stand x10 mins before pain ADLs Comments: pt reports using button hook for shirt buttons, ind with ADLs        OT Problem List: Decreased strength;Decreased range of motion;Decreased activity tolerance;Impaired balance (sitting and/or standing)      OT Treatment/Interventions: Self-care/ADL training;Therapeutic exercise;DME and/or AE instruction;Energy conservation;Therapeutic activities;Balance training;Patient/family education    OT Goals(Current goals can be found in the care plan section) Acute Rehab OT Goals Patient Stated Goal: none stated OT Goal Formulation: With patient Time For Goal Achievement: 06/17/22 Potential to Achieve Goals: Good ADL Goals Pt Will Perform Lower Body Dressing: with modified  independence;sit to/from stand;sitting/lateral leans Pt Will Transfer to Toilet: with modified independence;ambulating;regular height toilet Pt Will Perform Tub/Shower Transfer: Tub transfer;Shower transfer;with supervision;ambulating;shower seat;rolling walker Additional ADL Goal #1: pt will stand x5 min for functional task in order to improve activity tolerance for ADLs  OT Frequency: Min 2X/week    Co-evaluation              AM-PAC OT "6 Clicks" Daily Activity     Outcome Measure Help from another person eating meals?: None Help from another person taking care of personal grooming?: None Help from another person toileting, which includes using toliet, bedpan, or urinal?: A Little Help from another person bathing (including washing, rinsing, drying)?: A Lot Help from another person to put on and taking off regular upper body clothing?: A Little Help from another person to put on and taking off regular lower body clothing?: A Lot 6 Click Score: 18   End of Session Equipment Utilized During Treatment: Gait belt;Rolling walker (2 wheels) Nurse Communication: Mobility status  Activity Tolerance: Patient tolerated treatment well Patient left: in chair;with call bell/phone within reach;with family/visitor present  OT Visit Diagnosis: Unsteadiness on feet (R26.81);Other abnormalities of gait and mobility (R26.89);Muscle weakness (generalized) (M62.81)                Time: 4315-4008 OT Time Calculation (min): 30 min Charges:  OT General Charges $OT Visit: 1 Visit OT Evaluation $OT Eval Moderate Complexity: 1 Mod OT Treatments $Therapeutic  Activity: 8-22 mins  Lynnda Child, OTD, OTR/L Acute Rehab [phone] - [place] 06/03/2022, 9:20 AM

## 2022-06-03 NOTE — Discharge Summary (Signed)
Discharge Summary     Donna [name] 09-17-1945 76 y.o. female  [id number]  Admission Date: 06/02/2022  Discharge Date: 06/03/2022  Physician: Serafina Mitchell, MD  Admission Diagnosis: PAD (peripheral artery disease) ([place]) [I73.9]  HPI:   This is a 76 y.o. female who I have been asked to evaluate for left leg claudication.  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  Surgical reconstruction has been recommended.  Patient suffers from lifestyle limiting claudication.  She currently does not have an open wound however she has had issues in the past with a right foot wound which is now healed.  She does complain of discoloration in her toes and feet on both sides.   Patient suffers from shortness of breath with exertion.  She does not have any chest pain or palpitations.  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  She takes a statin for hypercholesterolemia.  She is on ARB for hypertension   Hospital Course:  The patient was admitted to the hospital and taken to the operating room on 06/02/2022 and underwent:   Left femoral endarterectomy with bovine patch angioplasty   Findings:  Extensive plaque from the inguinal ligament down in to the SFA and profunda.  The patch extends onto the SFA for 1.5 cm.   Eversion endarterectomy of the profunda   The pt tolerated the procedure well and was transported to the PACU in good condition.   By POD 1, pt doing well with palpable left DP pulse.  Left foot is warm.  Left groin incision is clean and dry.  Left calf and anterior compartments are soft.   She was able to void and ambulate.  Groin wound care discussed with pt.     CBC    Component Value Date/Time   WBC 6.1 06/02/2022 2359   RBC 3.13 (L) 06/02/2022 2359   HGB 9.9 (L) 06/02/2022 2359   HGB 11.6 05/19/2022 0959   HCT 30.3 (L) 06/02/2022 2359   HCT 33.8 (L) 05/19/2022 0959   PLT  187 06/02/2022 2359   PLT 309 05/19/2022 0959   MCV 96.8 06/02/2022 2359   MCV 94 05/19/2022 0959   MCH 31.6 06/02/2022 2359   MCHC 32.7 06/02/2022 2359   RDW 13.8 06/02/2022 2359   RDW 12.8 05/19/2022 0959   LYMPHSABS 1.1 10/26/2020 1515   MONOABS 1.1 (H) 10/26/2020 1515   EOSABS 0.3 10/26/2020 1515   BASOSABS 0.1 10/26/2020 1515    BMET    Component Value Date/Time   NA 137 06/02/2022 2359   NA 140 05/19/2022 0959   K 4.3 06/02/2022 2359   CL 106 06/02/2022 2359   CO2 23 06/02/2022 2359   GLUCOSE 162 (H) 06/02/2022 2359   BUN 20 06/02/2022 2359   BUN 16 05/19/2022 0959   CREATININE 1.35 (H) 06/02/2022 2359   CALCIUM 8.9 06/02/2022 2359   GFRNONAA 40 (L) 06/02/2022 2359   GFRAA >60 10/01/2015 1029     Discharge Instructions     Discharge patient   Complete by: As directed    Discharge home once pt has ambulated and voided.  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 spine & hands  - rheumatoid   Cancer ([place])    basal cell removed fr. face    Chronic kidney disease    stage 3   COPD (chronic obstructive pulmonary disease) ([place])    COVID    2022 and early 2023   Depression    onset after being diagnosed with rhematoid  arthritis   GERD (gastroesophageal reflux disease)    H/O echocardiogram    Hiatal hernia    History of hiatal hernia    Hypertension    Hypertension     currently followed for BP / heart management with Dr. Leanora Ivanoff, but prev. saw Dr. Einar Gip   Peripheral vascular disease [facility])    Seasonal allergies      Allergies as of 06/03/2022       Reactions   Thorazine [chlorpromazine] Anaphylaxis        Medication List     TAKE these medications    albuterol 108 (90 Base) MCG/ACT inhaler Commonly known as: VENTOLIN HFA Inhale 2 puffs into the lungs every 4 (four) hours as needed for wheezing or shortness of breath.   amLODipine 5 MG tablet Commonly known as: NORVASC Take 5 mg by mouth daily.   aspirin EC 81 MG tablet Take 1 tablet (81 mg total) by mouth daily. Swallow whole.   bisoprolol 5 MG tablet Commonly known as: ZEBETA TAKE 1 TABLET(5 MG) BY MOUTH DAILY   Breztri Aerosphere 160-9-4.8 MCG/ACT Aero Generic drug: Budeson-Glycopyrrol-Formoterol INHALE 2 INHALATIONS BY MOUTH  INTO THE LUNGS IN THE MORNING  AND AT BEDTIME   buPROPion 300 MG 24 hr tablet Commonly known as: WELLBUTRIN XL Take 300 mg by mouth daily. Take with the 150 to = 450 mg daily   buPROPion 150 MG 24 hr tablet Commonly known as: WELLBUTRIN XL Take 150 mg by mouth daily. Take with 300 mg to = 450 mg daily   clopidogrel 75 MG tablet Commonly known as: PLAVIX Take 75 mg by mouth daily.   CoQ-10 100 MG Caps Take 100 mg by mouth daily.   DULoxetine 20 MG capsule Commonly known as: CYMBALTA Take 20 mg by mouth daily.   famotidine 20 MG tablet Commonly known as: PEPCID Take 20 mg by mouth 2 (two) times daily.   HYDROcodone-acetaminophen 10-325 MG tablet Commonly known as: NORCO Take 1 tablet by mouth 3 (three) times daily as needed.   hydroxychloroquine 200 MG tablet Commonly known as: PLAQUENIL Take 200 mg by mouth daily.   leflunomide 20 MG tablet Commonly known as: ARAVA Take 20 mg by mouth daily.   losartan 25 MG tablet Commonly known as: COZAAR Take 1 tablet (25 mg total) by mouth daily.   Multivitamin Women Tabs Take 1 tablet by  mouth daily.   rosuvastatin 5 MG tablet Commonly known as: CRESTOR Take 5 mg by mouth daily.   triamterene-hydrochlorothiazide 37.5-25 MG tablet Commonly known as: MAXZIDE-25 Take 0.5 tablets by mouth daily.        Discharge Instructions: [facility] Discharge instructions Lower Extremity Bypass Surgery  Please refer to the following instruction for your post-procedure care. Your surgeon  or physician assistant will discuss any changes with you.  Activity  You are encouraged to walk as much as you can. You can slowly return to normal activities during the month after your surgery. Avoid strenuous activity and heavy lifting until your doctor tells you it's OK. Avoid activities such as vacuuming or swinging a golf club. Do not drive until your doctor give the OK and you are no longer taking prescription pain medications. It is also normal to have difficulty with sleep habits, eating and bowel movement after surgery. These will go away with time.  Bathing/Showering  You may shower after you go home. Do not soak in a bathtub, hot tub, or swim until the incision heals completely.  Incision Care  Clean your incision with mild soap and water. Shower every day. Pat the area dry with a clean towel. You do not need a bandage unless otherwise instructed. Do not apply any ointments or creams to your incision. If you have open wounds you will be instructed how to care for them or a visiting nurse may be arranged for you. If you have staples or sutures along your incision they will be removed at your post-op appointment. You may have skin glue on your incision. Do not peel it off. It will come off on its own in about one week.  Wash the groin wound with soap and water daily and pat dry. (No tub bath-only shower)  Then put a dry gauze or washcloth in the groin to keep this area dry to help prevent wound infection.  Do this daily and as needed.  Do not use Vaseline or  neosporin on your incisions.  Only use soap and water on your incisions and then protect and keep dry.  Diet  Resume your normal diet. There are no special food restrictions following this procedure. A low fat/ low cholesterol diet is recommended for all patients with vascular disease. In order to heal from your surgery, it is CRITICAL to get adequate nutrition. Your body requires vitamins, minerals, and protein. Vegetables are the best source of vitamins and minerals. Vegetables also provide the perfect balance of protein. Processed food has little nutritional value, so try to avoid this.  Medications  Resume taking all your medications unless your doctor or Physician Assistant tells you not to. If your incision is causing pain, you may take over-the-counter pain relievers such as acetaminophen (Tylenol). If you were prescribed a stronger pain medication, please aware these medication can cause nausea and constipation. Prevent nausea by taking the medication with a snack or meal. Avoid constipation by drinking plenty of fluids and eating foods with high amount of fiber, such as fruits, vegetables, and grains. Take Colace 100 mg (an over-the-counter stool softener) twice a day as needed for constipation.  Do not take Tylenol if you are taking prescription pain medications.  Follow Up  Our office will schedule a follow up appointment 2-3 weeks following discharge.  Please call us immediately for any of the following conditions  Severe or worsening pain in your legs or feet while at rest or while walking Increase pain, redness, warmth, or drainage (pus) from your incision site(s) Fever of 101 degree or higher The swelling in your leg with the bypass suddenly worsens and becomes more painful than when you were in the hospital If you have been instructed to feel your graft pulse then you should do so every day. If you can no longer feel this pulse, call the office immediately. Not  all patients are  given this instruction.  Leg swelling is common after leg bypass surgery.  The swelling should improve over a few months following surgery. To improve the swelling, you may elevate your legs above the level of your heart while you are sitting or resting. Your surgeon or physician assistant may ask you to apply an ACE wrap or wear compression (TED) stockings to help to reduce swelling.  Reduce your risk of vascular disease  Stop smoking. If you would like help call [facility] at 1-800-QUIT-NOW 725-585-1222) or [place] at [phone].  Manage your cholesterol Maintain a desired weight Control your diabetes weight Control your diabetes Keep your blood pressure down  If you have any questions, please call the office at [phone]   Prescriptions given: 1.  Norco 10/325 one q8h prn pain #10 No Refill  Disposition: home  Patient's condition: is Good  Follow up: 1. [facility] in 2-3 weeks   Leontine Locket, PA-C [facility] [phone] 06/03/2022  9:31 AM  - For VQI Registry use ---   Post-op:  Wound infection: No  Graft infection: No  Transfusion: No    If yes, n/a units given New Arrhythmia: No Ipsilateral amputation: No, '[ ]'$  Minor, '[ ]'$  BKA, '[ ]'$  AKA Discharge patency: [x ] Primary, '[ ]'$  Primary assisted, '[ ]'$  Secondary, '[ ]'$  Occluded Patency judged by: '[ ]'$  Dopper only, '[ ]'$  Palpable graft pulse, '[x]'$  Palpable distal pulse, '[ ]'$  ABI inc. > 0.15, '[ ]'$  Duplex Discharge ABI: R not done, L  D/C Ambulatory Status: Ambulatory  Complications: MI: No, '[ ]'$  Troponin only, '[ ]'$  EKG or Clinical CHF: No Resp failure:No, '[ ]'$  Pneumonia, '[ ]'$  Ventilator Chg in renal function: No, '[ ]'$  Inc. Cr > 0.5, '[ ]'$  Temp. Dialysis,  '[ ]'$  Permanent dialysis Stroke: No, '[ ]'$  Minor, '[ ]'$  Major Return to OR: No  Reason for return to OR: '[ ]'$  Bleeding, '[ ]'$  Infection, '[ ]'$  Thrombosis, '[ ]'$  Revision  Discharge medications: Statin use:  yes ASA use:  yes Plavix use:  yes Beta blocker use:  yes CCB use:  Yes ACEI use:   no ARB use:  yes Coumadin use: no

## 2022-06-03 NOTE — Progress Notes (Signed)
PHARMACIST LIPID MONITORING   Donna [name] is a 76 y.o. female admitted on 06/02/2022 with history of PAD admitted for surgery.  Pharmacy has been consulted to optimize lipid-lowering therapy with the indication of secondary prevention for clinical ASCVD.  Recent Labs:  Lipid Panel (last 6 months):   Lab Results  Component Value Date   CHOL 125 06/02/2022   TRIG 64 06/02/2022   HDL 64 06/02/2022   CHOLHDL 2.0 06/02/2022   VLDL 13 06/02/2022   LDLCALC 48 06/02/2022    Hepatic function panel (last 6 months):   Lab Results  Component Value Date   AST 21 05/19/2022   ALT 20 05/19/2022   ALKPHOS 61 05/19/2022   BILITOT 0.3 05/19/2022    SCr (since admission):   Serum creatinine: 1.35 mg/dL (H) 06/02/22 2359 Estimated creatinine clearance: 32.7 mL/min (A)  Current therapy and lipid therapy tolerance Current lipid-lowering therapy: crestor PTA Previous lipid-lowering therapies (if applicable): none Documented or reported allergies or intolerances to lipid-lowering therapies (if applicable): none  Assessment:   Patient prefers no changes in lipid-lowering therapy at this time due to crestor was started in July according to EMR  Plan:    1.Statin intensity (high intensity recommended for all patients regardless of the LDL):  No statin changes. The patient is already on a high intensity statin that has been adjusted 2/2 borderline creatinine clearance (~30 ml/min). This had been done from the OP provider. LDL is well controlled at 48 mg/dL  2.Add ezetimibe (if any one of the following):   Not indicated at this time.  3.Refer to lipid clinic:  No  4.Follow-up with:  Primary care provider - Deland Pretty, MD  5.Follow-up labs after discharge:  No changes in lipid therapy, repeat a lipid panel in one year.      Vaughan Basta BS, PharmD, BCPS Clinical Pharmacist 06/03/2022 10:00 AM  Contact: [phone] after 3 PM  "Be curious, not judgmental..." -Jamal Maes

## 2022-06-05 ENCOUNTER — Encounter (HOSPITAL_COMMUNITY): Payer: Self-pay | Admitting: Surgery

## 2022-06-05 DIAGNOSIS — Z9889 Other specified postprocedural states: Secondary | ICD-10-CM | POA: Insufficient documentation

## 2022-06-05 NOTE — Progress Notes (Unsigned)
Primary Physician/Referring:  Deland Pretty, MD  Patient ID: Donna [name], female    DOB: [birth year], 76 y.o.   MRN: [id number]  No chief complaint on file.  HPI:    Donna [name]  is a 76 y.o. female patient with hypertension, hyperlipidemia, degenerative joint disease and chronic back pain,  hyperglycemia, stage IIIa chronic kidney disease, prior tobacco use disorder with approximately 20-pack-year history of smoking quit in 2000.   She presents here for follow-up of peripheral artery disease.  Three months ago she developed a draining wound on her right fifth toe and was placed on antibiotics.  Wound completely healed on conservative therapy.  Due to severe lifestyle-limiting claudication, underwent peripheral arteriogram on 05/30/2022 and found to have complex and calcified high-grade/occlusion of the left CFA, underwent left femoral artery endarterectomy by Dr. Trula Slade on 06/02/2022.  She now presents for follow-up.  She denies chest pain, shortness of breath, palpitations, orthopnea, PND, syncope.  Past Medical History:  Diagnosis Date   Arthritis    lumbar, toes, L knee, cerv. spine & hands  - rheumatoid   Cancer (HCC)    basal cell removed fr. 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 Leanora Ivanoff, but prev. saw Dr. Einar Gip   Peripheral vascular disease [facility])    Seasonal allergies    Past Surgical History:  Procedure Laterality Date   ABDOMINAL AORTOGRAM W/LOWER EXTREMITY N/A 05/30/2022   Procedure: ABDOMINAL AORTOGRAM W/LOWER EXTREMITY;  Surgeon: Adrian Prows, MD;  Location: [facility];  Service: Cardiovascular;   Laterality: N/A;   BACK SURGERY  07/31/1992   cerv. spine fusion    BREAST ENHANCEMENT SURGERY     BREAST SURGERY  04/01/1979   implants- bilateral    CERVICAL SPINE SURGERY  07/31/1990   fusion   ENDARTERECTOMY FEMORAL Left 06/02/2022   Procedure: LEFT ENDARTERECTOMY FEMORAL with BOVINE PATCH;  Surgeon: Serafina Mitchell, MD;  Location: [facility];  Service: Vascular;  Laterality: Left;   JOINT REPLACEMENT Right 07/31/2005   LUMBAR LAMINECTOMY/DECOMPRESSION MICRODISCECTOMY Left 10/07/2015   Procedure: Lumbar three-five Decompressive lumbar Laminectomy & Left Lumbar four-five Microdiscectomy;  Surgeon: Jovita Gamma, MD;  Location: [facility];  Service: Neurosurgery;  Laterality: Left;   LUMBAR SPINE SURGERY  2016   TONSILLECTOMY     TOTAL HIP ARTHROPLASTY  07/31/2005   Right   TUBAL LIGATION     Family History  Problem Relation Age of Onset   Heart disease Maternal Aunt    Heart disease Maternal Aunt    Lung cancer Sister    Kidney cancer Mother    Kidney cancer Maternal Uncle    Kidney cancer Maternal Aunt     Social History   Tobacco Use   Smoking status: Former    Packs/day: 0.70    Years: 24.00    Total pack years: 16.80    Types: Cigarettes    Quit date: 07/31/2009    Years since quitting: 12.8   Smokeless tobacco: Former    Quit date: 10/01/1998  Substance Use Topics   Alcohol use: Yes    Comment: wine- rarely   Marital Status: Married  ROS  Review of Systems  Cardiovascular:  Positive for claudication, cyanosis and leg swelling. Negative for chest pain, dyspnea on exertion, paroxysmal nocturnal dyspnea and syncope.   Objective  There were no vitals taken for this visit. There is no height or weight on file to calculate BMI.     06/03/2022    8:27 AM 06/03/2022    8:09 AM 06/03/2022    3:06 AM  Vitals with BMI  Systolic 784  696  Diastolic 77  63  Pulse  93 77    Physical Exam Cardiovascular:     Rate and Rhythm: Normal rate and regular rhythm.     Pulses:           Carotid pulses are 2+ on the right side with bruit and 2+ on the left side with bruit.      Radial pulses are 2+ on the right side and 2+ on the left side.       Femoral pulses are 1+ on the right side and 1+ on the left side.      Popliteal pulses are 0 on the right side and 0 on the left side.       Dorsalis pedis pulses are 0 on the right side and 0 on the left side.       Posterior tibial pulses are 0 on the right side and 0 on the left side.     Heart sounds: Normal heart sounds. No murmur heard.    No gallop.  Pulmonary:     Effort: Pulmonary effort is normal.     Breath sounds: Normal breath sounds. No wheezing or rales.  Musculoskeletal:     Right lower leg: Edema present.     Left lower leg: Edema present.  Feet:     Right foot:     Skin integrity: Skin breakdown and warmth present.     Left foot:     Skin integrity: Skin breakdown, erythema and warmth present.     Comments: Right fifth toe erythematous wound with purulent drainage. Discoloration noted to bilateral toes. Skin:    General: Skin is warm.     Coloration: Skin is pale (BLE).  Neurological:     Mental Status: She is alert.    Medications and allergies   Allergies  Allergen Reactions   Thorazine [Chlorpromazine] Anaphylaxis     Medication list after today's encounter   Current Outpatient Medications:    albuterol (PROVENTIL HFA;VENTOLIN HFA) 108 (90 Base) MCG/ACT inhaler, Inhale 2 puffs into the lungs every 4 (four) hours as needed for wheezing or shortness of breath., Disp: , Rfl:    amLODipine (NORVASC) 5 MG tablet, Take 5 mg by mouth daily., Disp: , Rfl:    aspirin EC 81 MG tablet, Take 1 tablet (81 mg total) by mouth daily. Swallow whole., Disp: 30 tablet, Rfl: 12   bisoprolol (ZEBETA) 5 MG tablet, TAKE 1 TABLET(5 MG) BY MOUTH DAILY, Disp: 30 tablet, Rfl: 5   BREZTRI AEROSPHERE 160-9-4.8 MCG/ACT AERO, INHALE 2 INHALATIONS BY MOUTH  INTO THE LUNGS IN THE MORNING  AND AT BEDTIME, Disp: 32.1 g,  Rfl: 3   buPROPion (WELLBUTRIN XL) 150 MG 24 hr tablet, Take 150 mg by mouth daily. Take with 300 mg to = 450 mg daily, Disp: , Rfl:    buPROPion (WELLBUTRIN XL) 300 MG 24 hr tablet, Take 300  mg by mouth daily. Take with the 150 to = 450 mg daily, Disp: , Rfl:    clopidogrel (PLAVIX) 75 MG tablet, Take 75 mg by mouth daily., Disp: , Rfl:    Coenzyme Q10 (COQ-10) 100 MG CAPS, Take 100 mg by mouth daily., Disp: , Rfl:    DULoxetine (CYMBALTA) 20 MG capsule, Take 20 mg by mouth daily., Disp: , Rfl:    famotidine (PEPCID) 20 MG tablet, Take 20 mg by mouth 2 (two) times daily., Disp: , Rfl:    HYDROcodone-acetaminophen (NORCO) 10-325 MG tablet, Take 1 tablet by mouth 3 (three) times daily as needed., Disp: 10 tablet, Rfl: 0   hydroxychloroquine (PLAQUENIL) 200 MG tablet, Take 200 mg by mouth daily., Disp: , Rfl:    leflunomide (ARAVA) 20 MG tablet, Take 20 mg by mouth daily., Disp: , Rfl:    losartan (COZAAR) 25 MG tablet, Take 1 tablet (25 mg total) by mouth daily., Disp: 90 tablet, Rfl: 3   Multiple Vitamins-Minerals (MULTIVITAMIN WOMEN) TABS, Take 1 tablet by mouth daily., Disp: , Rfl:    rosuvastatin (CRESTOR) 5 MG tablet, Take 5 mg by mouth daily., Disp: , Rfl:    triamterene-hydrochlorothiazide (MAXZIDE-25) 37.5-25 MG tablet, Take 0.5 tablets by mouth daily., Disp: , Rfl:   Laboratory examination:   Lab Results  Component Value Date   NA 137 06/02/2022   K 4.3 06/02/2022   CO2 23 06/02/2022   GLUCOSE 162 (H) 06/02/2022   BUN 20 06/02/2022   CREATININE 1.35 (H) 06/02/2022   CALCIUM 8.9 06/02/2022   EGFR 42 (L) 05/19/2022   GFRNONAA 40 (L) 06/02/2022       Latest Ref Rng & Units 06/02/2022   11:59 PM 06/02/2022    4:25 PM 05/19/2022    9:59 AM  CMP  Glucose 70 - 99 mg/dL 162   97   BUN 8 - 23 mg/dL 20   16   Creatinine 0.44 - 1.00 mg/dL 1.35  1.25  1.31   Sodium 135 - 145 mmol/L 137   140   Potassium 3.5 - 5.1 mmol/L 4.3   4.0   Chloride 98 - 111 mmol/L 106   100   CO2 22 - 32  mmol/L 23   21   Calcium 8.9 - 10.3 mg/dL 8.9   9.3   Total Protein 6.0 - 8.5 g/dL   5.8   Total Bilirubin 0.0 - 1.2 mg/dL   0.3   Alkaline Phos 44 - 121 IU/L   61   AST 0 - 40 IU/L   21   ALT 0 - 32 IU/L   20       Latest Ref Rng & Units 06/02/2022   11:59 PM 06/02/2022    4:25 PM 05/19/2022    9:59 AM  CBC  WBC 4.0 - 10.5 K/uL 6.1  7.3  13.1   Hemoglobin 12.0 - 15.0 g/dL 9.9  10.4  11.6   Hematocrit 36.0 - 46.0 % 30.3  32.4  33.8   Platelets 150 - 400 K/uL 187  182  309     Lipid Panel Recent Labs    06/02/22 2359  CHOL 125  TRIG 64  LDLCALC 48  VLDL 13  HDL 64  CHOLHDL 2.0    HEMOGLOBIN A1C No results found for: "HGBA1C", "MPG" TSH No results for input(s): "TSH" in the last 8760 hours.  External labs:   02/15/2022: Sodium 140, potassium 4.0, glucose 104, BUN 21, creatinine 1.24 EGFR 42 Hemoglobin 13.7,  hematocrit 40.5, platelet count 244  08/30/2021: Hemoglobin A1c 6.0%  Radiology:   High-resolution CT scan of the chest 03/02/2022: 1. Cardiovascular: Normal heart size. No pericardial effusion. Mitral annular calcifications. Severe three-vessel coronary artery calcifications. Normal caliber thoracic aorta with severe calcified plaque.  2. Aortic Atherosclerosis and Emphysema  3. Part solid nodule of the right upper lobe measuring 18 mm in overall diameter with 4 mm solid component. Follow-up non-contrast CT recommended at 3-6 months to confirm persistence.  Additional bilateral solid pulmonary nodules are seen, largest measures 4 mm. Recommend attention on follow-up.  Cardiac Studies:   PCV MYOCARDIAL PERFUSION WITH LEXISCAN 05/22/2022  Lexiscan nuclear stress test performed using 1-day protocol. SPECT images show decreased tracer uptake in inferior myocardium, likely due to breast attenuation with imaging performed in the sitting position. Stress LVEF 65%.   PCV ECHOCARDIOGRAM COMPLETE 05/25/2022  Study Quality: Technically difficult study. Normal LV  systolic function with visual EF 60-65%. Left ventricle cavity is normal in size. Normal left ventricular wall thickness. Normal global wall motion. Normal diastolic filling pattern, normal LAP. Trace tricuspid regurgitation. No evidence of pulmonary hypertension. Small pericardial effusion. There is no hemodynamic significance. Compared to 10/17/2017: G2DD & elevated LAP are now normal, otherwise no significant change.  Carotid artery duplex 05/25/2022: Duplex suggests stenosis in the right internal carotid artery (1-15%). No evidence of significant stenosis in the right external carotid artery. Duplex suggests stenosis in the left internal carotid artery (1-15%). No evidence of significant stenosis in the left external carotid artery. Antegrade right vertebral artery flow. Antegrade left vertebral artery flow. Mild heterogeneous plaque noted bilaterally. Compared to the study done on 03/01/2020, left ICA stenosis of 50 to 49% not present.  Lower Extremity Arterial Duplex 05/25/2022: Diffuse calcific plaque throughout the bilateral lower extremity arteries.  Right CFA & PFA stenosis >50. Right distal SFA >50% stenosis.  Left CFA with severe dampened monophasic waveform suggests significant proximal vessel disease (Iliac artery).  Left CFA and Proximal SFA stenosis of >50%.  This exam reveals mildly decreased perfusion of the right lower extremity, noted at the post tibial artery level (ABI 0.81), severe dampened monophasic waveform pattern at the ankle.   This exam reveals moderately decreased perfusion of the left lower extremity, noted at the dorsalis pedis artery level (ABI 0.79), severe dampened monophasic waveform pattern at the ankle.  Bilateral baker's cyst noted.   Peripheral arteriogram 05/30/2022: Abdominal aortogram revealed presence of 2 renal arteries on either side, left renal artery has mild diffuse disease.  Right renal artery is widely patent.  The abdominal aorta is mildly  tortuous, distal abdominal aorta has mild ectasia.  Aortoiliac bifurcation is widely patent.  Bilateral CIA and EIA are widely patent with mild disease. Left lower extremity: Left CFA has calcific 90% stenosis.  There is mild to moderate skipped disease in the left mid and distal SFA.  No high-grade stenosis.  Lesions are about 50 to 60%.  Below the left knee, there is two-vessel runoff in the form of peroneal artery and posterior tibial artery.  AT is occluded. Right lower extremity: Right CFA is widely patent.  Mild disease is evident.  There are multiple tandem high-grade 80 to 90% focal stenosis in the right proximal, mid SFA.  Below the right knee there is only a single vessel runoff in the form of peroneal artery and this reconstitutes the AT at the level of the ankle.   Recommendation: Patient will need surgical left CFA endarterectomy.  Depending upon her  symptoms, she may need right SFA atherectomy/balloon angioplasty/stenting at a later date.    Left Femoral endarterectomy 06/02/2022: Extensive plaque from the inguinal ligament down in to the SFA and profunda.  The patch extends onto the SFA for 1.5 cm.   Eversion endarterectomy of the profunda  EKG:    Assessment     ICD-10-CM   1. PAD (peripheral artery disease) (HCC)  I73.9     2. Claudication ([place])  I73.9     3. Primary hypertension  I10        No orders of the defined types were placed in this encounter.   No orders of the defined types were placed in this encounter.   There are no discontinued medications.    Recommendations:   CHELSAE ZANELLA is a 76 y.o. female patient with hypertension, hyperlipidemia, degenerative joint disease and chronic back pain,  hyperglycemia, stage IIIa chronic kidney disease, prior tobacco use disorder with approximately 20-pack-year history of smoking quit in 2000.   She presents here for follow-up of peripheral artery disease.  Three months ago she developed a draining wound on her  right fifth toe and was placed on antibiotics.  Wound completely healed on conservative therapy.  Due to severe lifestyle-limiting claudication, underwent peripheral arteriogram on 05/30/2022 and found to have complex and calcified high-grade/occlusion of the left CFA, underwent left femoral artery endarterectomy by Dr. Trula Slade on 06/02/2022.  She now presents for follow-up.  1. PAD (peripheral artery disease) (HCC) ***  2. Claudication (HCC) ***  3. Primary hypertension ***  4. H/O left femoral artery endarterectomy 06/02/2022 ***    Adrian Prows, MD, [facility] 06/05/2022, 9:58 PM Office: [phone] Fax: [phone] Pager: [phone]

## 2022-06-05 NOTE — Anesthesia Postprocedure Evaluation (Signed)
Anesthesia Post Note  Patient: Donna [name]  Procedure(s) Performed: LEFT ENDARTERECTOMY FEMORAL with BOVINE PATCH (Left: Groin)     Patient location during evaluation: PACU Anesthesia Type: General Level of consciousness: awake and alert Pain management: pain level controlled Vital Signs Assessment: post-procedure vital signs reviewed and stable Respiratory status: spontaneous breathing, nonlabored ventilation, respiratory function stable and patient connected to nasal cannula oxygen Cardiovascular status: blood pressure returned to baseline and stable Postop Assessment: no apparent nausea or vomiting Anesthetic complications: no  No notable events documented.  Last Vitals:  Vitals:   06/03/22 0809 06/03/22 0827  BP:  125/77  Pulse: 93   Resp: (!) 21   Temp: 36.7 C 36.7 C  SpO2: 94%     Last Pain:  Vitals:   06/03/22 1004  TempSrc:   PainSc: 0-No pain                 Delfina Schreurs S

## 2022-06-06 ENCOUNTER — Encounter: Payer: Self-pay | Admitting: Cardiology

## 2022-06-06 ENCOUNTER — Telehealth: Payer: Self-pay | Admitting: Physician Assistant

## 2022-06-06 ENCOUNTER — Ambulatory Visit: Payer: Medicare Other | Admitting: Cardiology

## 2022-06-06 VITALS — BP 118/75 | HR 81 | Temp 97.6°F | Resp 16 | Ht 66.0 in | Wt 160.8 lb

## 2022-06-06 DIAGNOSIS — I739 Peripheral vascular disease, unspecified: Secondary | ICD-10-CM

## 2022-06-06 DIAGNOSIS — M0579 Rheumatoid arthritis with rheumatoid factor of multiple sites without organ or systems involvement: Secondary | ICD-10-CM | POA: Diagnosis not present

## 2022-06-06 DIAGNOSIS — Z9889 Other specified postprocedural states: Secondary | ICD-10-CM

## 2022-06-06 DIAGNOSIS — N1831 Chronic kidney disease, stage 3a: Secondary | ICD-10-CM | POA: Diagnosis not present

## 2022-06-06 DIAGNOSIS — G5600 Carpal tunnel syndrome, unspecified upper limb: Secondary | ICD-10-CM | POA: Diagnosis not present

## 2022-06-06 DIAGNOSIS — M797 Fibromyalgia: Secondary | ICD-10-CM | POA: Diagnosis not present

## 2022-06-06 DIAGNOSIS — I10 Essential (primary) hypertension: Secondary | ICD-10-CM | POA: Diagnosis not present

## 2022-06-06 DIAGNOSIS — M79643 Pain in unspecified hand: Secondary | ICD-10-CM | POA: Diagnosis not present

## 2022-06-06 DIAGNOSIS — I6523 Occlusion and stenosis of bilateral carotid arteries: Secondary | ICD-10-CM | POA: Diagnosis not present

## 2022-06-06 DIAGNOSIS — R768 Other specified abnormal immunological findings in serum: Secondary | ICD-10-CM | POA: Diagnosis not present

## 2022-06-06 DIAGNOSIS — M1990 Unspecified osteoarthritis, unspecified site: Secondary | ICD-10-CM | POA: Diagnosis not present

## 2022-06-06 DIAGNOSIS — Z79899 Other long term (current) drug therapy: Secondary | ICD-10-CM | POA: Diagnosis not present

## 2022-06-06 NOTE — Telephone Encounter (Signed)
-----   Message from Gabriel Earing, [place] sent at 06/03/2022  9:32 AM EDT ----- S/p left fem endart 11/3.  F/u in 2-3 weeks on Dr. Trula Slade clinic day.  Thanks

## 2022-06-07 DIAGNOSIS — Z7902 Long term (current) use of antithrombotics/antiplatelets: Secondary | ICD-10-CM | POA: Diagnosis not present

## 2022-06-07 DIAGNOSIS — K219 Gastro-esophageal reflux disease without esophagitis: Secondary | ICD-10-CM | POA: Diagnosis not present

## 2022-06-07 DIAGNOSIS — Z7982 Long term (current) use of aspirin: Secondary | ICD-10-CM | POA: Diagnosis not present

## 2022-06-07 DIAGNOSIS — Z85828 Personal history of other malignant neoplasm of skin: Secondary | ICD-10-CM | POA: Diagnosis not present

## 2022-06-07 DIAGNOSIS — I10 Essential (primary) hypertension: Secondary | ICD-10-CM | POA: Diagnosis not present

## 2022-06-07 DIAGNOSIS — I70212 Atherosclerosis of native arteries of extremities with intermittent claudication, left leg: Secondary | ICD-10-CM | POA: Diagnosis not present

## 2022-06-07 DIAGNOSIS — Z48812 Encounter for surgical aftercare following surgery on the circulatory system: Secondary | ICD-10-CM | POA: Diagnosis not present

## 2022-06-07 DIAGNOSIS — Z79891 Long term (current) use of opiate analgesic: Secondary | ICD-10-CM | POA: Diagnosis not present

## 2022-06-07 DIAGNOSIS — Z9862 Peripheral vascular angioplasty status: Secondary | ICD-10-CM | POA: Diagnosis not present

## 2022-06-07 DIAGNOSIS — J449 Chronic obstructive pulmonary disease, unspecified: Secondary | ICD-10-CM | POA: Diagnosis not present

## 2022-06-12 ENCOUNTER — Encounter (HOSPITAL_COMMUNITY): Payer: Self-pay | Admitting: Cardiology

## 2022-06-14 ENCOUNTER — Telehealth: Payer: Self-pay

## 2022-06-14 ENCOUNTER — Ambulatory Visit (INDEPENDENT_AMBULATORY_CARE_PROVIDER_SITE_OTHER): Payer: Medicare Other | Admitting: Physician Assistant

## 2022-06-14 VITALS — BP 161/89 | HR 98 | Temp 97.6°F | Resp 20 | Ht 66.0 in | Wt 152.0 lb

## 2022-06-14 DIAGNOSIS — I10 Essential (primary) hypertension: Secondary | ICD-10-CM | POA: Diagnosis not present

## 2022-06-14 DIAGNOSIS — I70212 Atherosclerosis of native arteries of extremities with intermittent claudication, left leg: Secondary | ICD-10-CM | POA: Diagnosis not present

## 2022-06-14 DIAGNOSIS — J449 Chronic obstructive pulmonary disease, unspecified: Secondary | ICD-10-CM | POA: Diagnosis not present

## 2022-06-14 DIAGNOSIS — Z9862 Peripheral vascular angioplasty status: Secondary | ICD-10-CM | POA: Diagnosis not present

## 2022-06-14 DIAGNOSIS — Z48812 Encounter for surgical aftercare following surgery on the circulatory system: Secondary | ICD-10-CM | POA: Diagnosis not present

## 2022-06-14 DIAGNOSIS — I739 Peripheral vascular disease, unspecified: Secondary | ICD-10-CM

## 2022-06-14 DIAGNOSIS — K219 Gastro-esophageal reflux disease without esophagitis: Secondary | ICD-10-CM | POA: Diagnosis not present

## 2022-06-14 NOTE — Telephone Encounter (Signed)
Pt called with c/o "grape sized knot" right below groin incision that she noticed shortly after surgery. She denies any pain of the area. She is very nervous about it. Pt was given same day appt with APP.

## 2022-06-14 NOTE — Progress Notes (Signed)
POST OPERATIVE OFFICE NOTE    CC:  F/u for surgery  HPI:  This is a 76 y.o. female who is s/p left femoral endarterectomy with bovine patch angioplasty by Dr. Trula Slade on 06/02/2022 due to lifestyle limiting left leg claudication.  She was added on to clinic schedule today urgently for evaluation of "knot" in left groin.  She denies pain, redness, or drainage.  Her claudication symptoms of left leg have completely resolved.  She believes the incision is healing well.  She denies fevers, chills, nausea/vomiting.  She is on her aspirin, Plavix, statin daily.  She is accompanied by her husband today.  Allergies  Allergen Reactions   Thorazine [Chlorpromazine] Anaphylaxis    Current Outpatient Medications  Medication Sig Dispense Refill   albuterol (PROVENTIL HFA;VENTOLIN HFA) 108 (90 Base) MCG/ACT inhaler Inhale 2 puffs into the lungs every 4 (four) hours as needed for wheezing or shortness of breath.     amLODipine (NORVASC) 5 MG tablet Take 5 mg by mouth daily.     aspirin EC 81 MG tablet Take 1 tablet (81 mg total) by mouth daily. Swallow whole. 30 tablet 12   bisoprolol (ZEBETA) 5 MG tablet TAKE 1 TABLET(5 MG) BY MOUTH DAILY 30 tablet 5   BREZTRI AEROSPHERE 160-9-4.8 MCG/ACT AERO INHALE 2 INHALATIONS BY MOUTH  INTO THE LUNGS IN THE MORNING  AND AT BEDTIME 32.1 g 3   buPROPion (WELLBUTRIN XL) 150 MG 24 hr tablet Take 150 mg by mouth daily. Take with 300 mg to = 450 mg daily     buPROPion (WELLBUTRIN XL) 300 MG 24 hr tablet Take 300 mg by mouth daily. Take with the 150 to = 450 mg daily     clopidogrel (PLAVIX) 75 MG tablet Take 75 mg by mouth daily.     Coenzyme Q10 (COQ-10) 100 MG CAPS Take 100 mg by mouth daily.     DULoxetine (CYMBALTA) 20 MG capsule Take 20 mg by mouth daily.     famotidine (PEPCID) 20 MG tablet Take 20 mg by mouth 2 (two) times daily.     HYDROcodone-acetaminophen (NORCO) 10-325 MG tablet Take 1 tablet by mouth 3 (three) times daily as needed. 10 tablet 0    hydroxychloroquine (PLAQUENIL) 200 MG tablet Take 200 mg by mouth daily.     leflunomide (ARAVA) 20 MG tablet Take 20 mg by mouth daily.     losartan (COZAAR) 25 MG tablet Take 1 tablet (25 mg total) by mouth daily. 90 tablet 3   Multiple Vitamins-Minerals (MULTIVITAMIN WOMEN) TABS Take 1 tablet by mouth daily.     rosuvastatin (CRESTOR) 5 MG tablet Take 5 mg by mouth daily.     triamterene-hydrochlorothiazide (MAXZIDE-25) 37.5-25 MG tablet Take 0.5 tablets by mouth daily.     No current facility-administered medications for this visit.     ROS:  See HPI  Physical Exam:  Vitals:   06/14/22 1106  BP: (!) 161/89  Pulse: 98  Resp: 20  Temp: 97.6 F (36.4 C)  TempSrc: Temporal  SpO2: 98%  Weight: 152 lb (68.9 kg)  Height: '5\' 6"'$  (1.676 m)    Incision: Left groin incision healing well with fluid collection just inferior to the incision line; patient is soft and no sign of skin compromise or surrounding erythema Extremities: Palpable left DP pulse Neuro: A&O    Assessment/Plan:  This is a 76 y.o. female who is s/p: Left femoral endarterectomy with bovine patch angioplasty  -Left foot is well-perfused with a palpable DP  pulse -Patient has a fluid collection in the left groin consistent with a seroma.  There is no redness, drainage, or sign of infection at this time.  She can try a warm compress to help speed reabsorption.  She will keep her postoperative appointment with Dr. Trula Slade in 2 weeks.  She will notify our office if this fluid collection increases in size or develops redness or drainage.    Dagoberto Ligas, PA-C [facility] [phone]  Clinic MD:  Donzetta Matters

## 2022-06-16 ENCOUNTER — Ambulatory Visit: Payer: Medicare Other

## 2022-06-16 DIAGNOSIS — Z9889 Other specified postprocedural states: Secondary | ICD-10-CM | POA: Diagnosis not present

## 2022-06-16 DIAGNOSIS — I739 Peripheral vascular disease, unspecified: Secondary | ICD-10-CM | POA: Diagnosis not present

## 2022-06-21 ENCOUNTER — Encounter (HOSPITAL_COMMUNITY): Payer: Self-pay | Admitting: Surgery

## 2022-06-26 ENCOUNTER — Ambulatory Visit (INDEPENDENT_AMBULATORY_CARE_PROVIDER_SITE_OTHER): Payer: Medicare Other | Admitting: Surgery

## 2022-06-26 ENCOUNTER — Encounter: Payer: Self-pay | Admitting: Surgery

## 2022-06-26 VITALS — BP 140/81 | HR 93 | Temp 97.9°F | Resp 20 | Ht 66.0 in | Wt 154.0 lb

## 2022-06-26 DIAGNOSIS — I70213 Atherosclerosis of native arteries of extremities with intermittent claudication, bilateral legs: Secondary | ICD-10-CM

## 2022-06-26 MED ORDER — CEPHALEXIN 500 MG PO CAPS: 500.0000 mg | ORAL_CAPSULE | Freq: Three times a day (TID) | ORAL | 0 refills | Status: DC

## 2022-06-26 NOTE — Progress Notes (Signed)
Patient name: Donna [name] MRN: [id number] DOB: 25-Apr-1946 Sex: female  REASON FOR VISIT:     Post op  HISTORY OF PRESENT ILLNESS:   Donna [name] is a 76 y.o. female that I saw for evaluation of left leg claudication at the request of Dr. Tessie Fass.  She had undergone angiography that revealed nearly occlusive disease within the left common femoral artery.  On 06/02/2022, she underwent left femoral endarterectomy with bovine pericardial patch angioplasty.  Intraoperatively, the patient was found to have extensive plaque from the inguinal ligament down to the superficial femoral artery and for from the femoral artery.  The patch extended onto the superficial femoral artery for 1.5 cm and eversion endarterectomy of the profundofemoral artery was performed.  She was discharged home on postoperative day #1.  She is back today for follow-up.  She states that her leg is dramatically better.  She is no longer having to hold onto walk with her walker.  She did have some drainage that stopped over the weekend   Patient suffers from shortness of breath with exertion.  She does not have any chest pain or palpitations.  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  She takes a statin for hypercholesterolemia.  She is on ARB for hypertension  CURRENT MEDICATIONS:    Current Outpatient Medications  Medication Sig Dispense Refill   albuterol (PROVENTIL HFA;VENTOLIN HFA) 108 (90 Base) MCG/ACT inhaler Inhale 2 puffs into the lungs every 4 (four) hours as needed for wheezing or shortness of breath.     amLODipine (NORVASC) 5 MG tablet Take 5 mg by mouth daily.     aspirin EC 81 MG tablet Take 1 tablet (81 mg total) by mouth daily. Swallow whole. 30 tablet 12   bisoprolol (ZEBETA) 5 MG tablet TAKE 1 TABLET(5 MG) BY MOUTH DAILY 30 tablet 5   BREZTRI AEROSPHERE 160-9-4.8 MCG/ACT AERO INHALE 2 INHALATIONS BY MOUTH  INTO THE LUNGS IN THE MORNING  AND AT BEDTIME 32.1 g 3    buPROPion (WELLBUTRIN XL) 150 MG 24 hr tablet Take 150 mg by mouth daily. Take with 300 mg to = 450 mg daily     buPROPion (WELLBUTRIN XL) 300 MG 24 hr tablet Take 300 mg by mouth daily. Take with the 150 to = 450 mg daily     clopidogrel (PLAVIX) 75 MG tablet Take 75 mg by mouth daily.     Coenzyme Q10 (COQ-10) 100 MG CAPS Take 100 mg by mouth daily.     DULoxetine (CYMBALTA) 20 MG capsule Take 20 mg by mouth daily.     famotidine (PEPCID) 20 MG tablet Take 20 mg by mouth 2 (two) times daily.     HYDROcodone-acetaminophen (NORCO) 10-325 MG tablet Take 1 tablet by mouth 3 (three) times daily as needed. 10 tablet 0   hydroxychloroquine (PLAQUENIL) 200 MG tablet Take 200 mg by mouth daily.     leflunomide (ARAVA) 20 MG tablet Take 20 mg by mouth daily.     losartan (COZAAR) 25 MG tablet Take 1 tablet (25 mg total) by mouth daily. 90 tablet 3   Multiple Vitamins-Minerals (MULTIVITAMIN WOMEN) TABS Take 1 tablet by mouth daily.     rosuvastatin (CRESTOR) 5 MG tablet Take 5 mg by mouth daily.     triamterene-hydrochlorothiazide (MAXZIDE-25) 37.5-25 MG tablet Take 0.5 tablets by mouth daily.     No current facility-administered medications for this visit.    REVIEW OF SYSTEMS:   '[X]'$  denotes  positive finding, '[ ]'$  denotes negative finding Cardiac  Comments:  Chest pain or chest pressure:    Shortness of breath upon exertion:    Short of breath when lying flat:    Irregular heart rhythm:    Constitutional    Fever or chills:      PHYSICAL EXAM:   Vitals:   06/26/22 1342  BP: (!) 140/81  Pulse: 93  Resp: 20  Temp: 97.9 F (36.6 C)  SpO2: 92%  Weight: 154 lb (69.9 kg)  Height: '5\' 6"'$  (1.676 m)    GENERAL: The patient is a well-nourished female, in no acute distress. The vital signs are documented above. CARDIOVASCULAR: There is a regular rate and rhythm. PULMONARY: Non-labored respirations Moderate size left groin seroma.  I probed this with a Q-tip and drained out several 100 cc  of clear fluid.  There is slight erythema over top of her incision  STUDIES:   None   MEDICAL ISSUES:   Status post left femoral endarterectomy: I drained a moderate-sized seroma today by opening it with the back end of a Q-tip.  The hole was not big enough to pack.  I have encouraged her to keep this area dry.  I Georgina Peer give her 1 weeks worth of Keflex.  She will come back next week for wound check.  Leia Alf, MD, FACS [facility] [phone] Pager [phone]

## 2022-07-02 NOTE — H&P (View-Only) (Signed)
POST OPERATIVE OFFICE NOTE    CC:  F/u for surgery  HPI:  Donna [name] is a 76 y.o. female who is s/p left femoral endarterectomy with bovine patch angioplasty by Dr. Trula Slade on 06/02/2022 to treat lifestyle limiting LLE claudication.  Postoperatively, she developed a seroma in the left groin.  At her last visit with Dr. Trula Slade on 06/26/2022, the seroma was opened slightly with a Q-tip and drained of several 100cc of clear fluid.  She was instructed to keep the area dry and was given a 1 week course of Keflex.  She returns today for wound check.  The patient and her husband state that they have been trying to keep her left groin dry, however the gauze dressing will be saturated with serous fluid every hour and will have to be changed.  She denies any puslike drainage, erythema, fever, or chills.  She denies any tenderness to the left groin.  She denies any bleeding from the left groin.  She denies any claudication or rest pain.    Allergies  Allergen Reactions   Thorazine [Chlorpromazine] Anaphylaxis    Current Outpatient Medications  Medication Sig Dispense Refill   albuterol (PROVENTIL HFA;VENTOLIN HFA) 108 (90 Base) MCG/ACT inhaler Inhale 2 puffs into the lungs every 4 (four) hours as needed for wheezing or shortness of breath.     amLODipine (NORVASC) 5 MG tablet Take 5 mg by mouth daily.     aspirin EC 81 MG tablet Take 1 tablet (81 mg total) by mouth daily. Swallow whole. 30 tablet 12   bisoprolol (ZEBETA) 5 MG tablet TAKE 1 TABLET(5 MG) BY MOUTH DAILY 30 tablet 5   BREZTRI AEROSPHERE 160-9-4.8 MCG/ACT AERO INHALE 2 INHALATIONS BY MOUTH  INTO THE LUNGS IN THE MORNING  AND AT BEDTIME 32.1 g 3   buPROPion (WELLBUTRIN XL) 150 MG 24 hr tablet Take 150 mg by mouth daily. Take with 300 mg to = 450 mg daily     buPROPion (WELLBUTRIN XL) 300 MG 24 hr tablet Take 300 mg by mouth daily. Take with the 150 to = 450 mg daily     cephALEXin (KEFLEX) 500 MG capsule Take 1 capsule (500 mg total) by  mouth 3 (three) times daily. 21 capsule 0   clopidogrel (PLAVIX) 75 MG tablet Take 75 mg by mouth daily.     Coenzyme Q10 (COQ-10) 100 MG CAPS Take 100 mg by mouth daily.     DULoxetine (CYMBALTA) 20 MG capsule Take 20 mg by mouth daily.     famotidine (PEPCID) 20 MG tablet Take 20 mg by mouth 2 (two) times daily.     HYDROcodone-acetaminophen (NORCO) 10-325 MG tablet Take 1 tablet by mouth 3 (three) times daily as needed. 10 tablet 0   hydroxychloroquine (PLAQUENIL) 200 MG tablet Take 200 mg by mouth daily.     leflunomide (ARAVA) 20 MG tablet Take 20 mg by mouth daily.     losartan (COZAAR) 25 MG tablet Take 1 tablet (25 mg total) by mouth daily. 90 tablet 3   Multiple Vitamins-Minerals (MULTIVITAMIN WOMEN) TABS Take 1 tablet by mouth daily.     rosuvastatin (CRESTOR) 5 MG tablet Take 5 mg by mouth daily.     triamterene-hydrochlorothiazide (MAXZIDE-25) 37.5-25 MG tablet Take 0.5 tablets by mouth daily.     No current facility-administered medications for this visit.     ROS:  See HPI  Physical Exam:  Incision: Left groin incision mostly healed with pinhole opening and serous drainage.  No  erythema or signs of infection.  Extremities: Bilateral lower extremities warm and well-perfused Neuro: Bilateral lower extremities motor and sensation intact   Assessment/Plan:  This is a 76 y.o. female who is s/p: left femoral endarterectomy by Dr.Brabham on 06/02/2022  -The patient's left groin incision still has a pinhole opening that is difficult to heal.  She has significant serous drainage coming from this opening that requires dry gauze dressing changes every hour due to dressing saturation -Dr. Trula Slade has also seen this patient and we are in agreement that the patient will need left groin washout and wound VAC placement due to poor healing -She has just finished her 1 week course of Keflex with no infectious symptoms currently -Bilateral feet warm and well-perfused -She will continue dry  ABD dressing changes to the left groin.  She is also being scheduled for left groin washout and wound VAC placement with Dr. Trula Slade on 07/05/2022.  She will likely need to stay overnight   Vicente Serene, PA-C [facility] [phone]   Clinic MD:  Trula Slade

## 2022-07-02 NOTE — Progress Notes (Unsigned)
  POST OPERATIVE OFFICE NOTE    CC:  F/u for surgery  HPI:  Donna [name] is a 76 y.o. female who is s/p left femoral endarterectomy with bovine patch angioplasty by Dr. Trula Slade on 06/02/2022 to treat lifestyle limiting LLE claudication.  Postoperatively, she developed a seroma in the left groin.  At her last visit with Dr. Trula Slade on 06/26/2022, the seroma was opened slightly with a Q-tip and drained of several 100cc of clear fluid.  She was instructed to keep the area dry and was given a 1 week course of Keflex.  She returns today for wound check.  She is also being followed by [facility] for PAD. Last ABIs by their office on 06/16/2022. They may consider RLE angiogram and possible SFA intervention in 1-2 months if needed.   Allergies  Allergen Reactions   Thorazine [Chlorpromazine] Anaphylaxis    Current Outpatient Medications  Medication Sig Dispense Refill   albuterol (PROVENTIL HFA;VENTOLIN HFA) 108 (90 Base) MCG/ACT inhaler Inhale 2 puffs into the lungs every 4 (four) hours as needed for wheezing or shortness of breath.     amLODipine (NORVASC) 5 MG tablet Take 5 mg by mouth daily.     aspirin EC 81 MG tablet Take 1 tablet (81 mg total) by mouth daily. Swallow whole. 30 tablet 12   bisoprolol (ZEBETA) 5 MG tablet TAKE 1 TABLET(5 MG) BY MOUTH DAILY 30 tablet 5   BREZTRI AEROSPHERE 160-9-4.8 MCG/ACT AERO INHALE 2 INHALATIONS BY MOUTH  INTO THE LUNGS IN THE MORNING  AND AT BEDTIME 32.1 g 3   buPROPion (WELLBUTRIN XL) 150 MG 24 hr tablet Take 150 mg by mouth daily. Take with 300 mg to = 450 mg daily     buPROPion (WELLBUTRIN XL) 300 MG 24 hr tablet Take 300 mg by mouth daily. Take with the 150 to = 450 mg daily     cephALEXin (KEFLEX) 500 MG capsule Take 1 capsule (500 mg total) by mouth 3 (three) times daily. 21 capsule 0   clopidogrel (PLAVIX) 75 MG tablet Take 75 mg by mouth daily.     Coenzyme Q10 (COQ-10) 100 MG CAPS Take 100 mg by mouth daily.     DULoxetine (CYMBALTA) 20 MG capsule  Take 20 mg by mouth daily.     famotidine (PEPCID) 20 MG tablet Take 20 mg by mouth 2 (two) times daily.     HYDROcodone-acetaminophen (NORCO) 10-325 MG tablet Take 1 tablet by mouth 3 (three) times daily as needed. 10 tablet 0   hydroxychloroquine (PLAQUENIL) 200 MG tablet Take 200 mg by mouth daily.     leflunomide (ARAVA) 20 MG tablet Take 20 mg by mouth daily.     losartan (COZAAR) 25 MG tablet Take 1 tablet (25 mg total) by mouth daily. 90 tablet 3   Multiple Vitamins-Minerals (MULTIVITAMIN WOMEN) TABS Take 1 tablet by mouth daily.     rosuvastatin (CRESTOR) 5 MG tablet Take 5 mg by mouth daily.     triamterene-hydrochlorothiazide (MAXZIDE-25) 37.5-25 MG tablet Take 0.5 tablets by mouth daily.     No current facility-administered medications for this visit.     ROS:  See HPI  Physical Exam:  ***  Incision:  *** Extremities:  *** Neuro: *** Abdomen:  ***    Assessment/Plan:  This is a 76 y.o. female who is s/p: left femoral endarterectomy by Dr.Brabham on 06/02/2022  -***   Vicente Serene, PA-C [facility] [phone]   Clinic MD:  ***

## 2022-07-03 ENCOUNTER — Other Ambulatory Visit: Payer: Self-pay

## 2022-07-03 ENCOUNTER — Ambulatory Visit (INDEPENDENT_AMBULATORY_CARE_PROVIDER_SITE_OTHER): Payer: Medicare Other | Admitting: Physician Assistant

## 2022-07-03 VITALS — BP 116/79 | HR 88 | Temp 97.1°F | Resp 16 | Wt 150.0 lb

## 2022-07-03 DIAGNOSIS — L7634 Postprocedural seroma of skin and subcutaneous tissue following other procedure: Secondary | ICD-10-CM

## 2022-07-04 ENCOUNTER — Telehealth: Payer: Self-pay

## 2022-07-04 NOTE — Telephone Encounter (Signed)
Attempted to reach patient so can update her on arrival time for surgery of 0815 AM on tomorrow, 07/05/22. Left message for patient to return call.

## 2022-07-04 NOTE — Progress Notes (Addendum)
I did not reach Ms McFarlan, I left a voice message with the following instructions: arrive at 0855,  NPO after midnight. I instructed patient with a sip of water take:Bisoprolol, use Breztri, take Amlodipine, Wellbutrin, Cymbalta, Pepcid, Crestor.  Prn: Tylenol  or Norco, Albuterol inhaler- brimg it with you.  I gave hygiene instructions and the phone number to Short Stay.

## 2022-07-05 ENCOUNTER — Encounter (HOSPITAL_COMMUNITY)
Admission: AD | Disposition: A | Discharge status: Home Health | Payer: Self-pay | Source: Home / Self Care | Attending: Surgery

## 2022-07-05 ENCOUNTER — Encounter (HOSPITAL_COMMUNITY): Payer: Self-pay | Admitting: Surgery

## 2022-07-05 ENCOUNTER — Observation Stay (HOSPITAL_BASED_OUTPATIENT_CLINIC_OR_DEPARTMENT_OTHER): Payer: Medicare Other | Admitting: Anesthesiology

## 2022-07-05 ENCOUNTER — Inpatient Hospital Stay (HOSPITAL_COMMUNITY)
Admission: AD | Admit: 2022-07-05 | Discharge: 2022-07-07 | DRG: 902 | Disposition: A | Discharge status: Home Health | Payer: Medicare Other | Attending: Surgery | Admitting: Surgery

## 2022-07-05 ENCOUNTER — Other Ambulatory Visit: Payer: Self-pay

## 2022-07-05 ENCOUNTER — Ambulatory Visit (HOSPITAL_BASED_OUTPATIENT_CLINIC_OR_DEPARTMENT_OTHER): Payer: Medicare Other | Admitting: Certified Registered"

## 2022-07-05 ENCOUNTER — Observation Stay (HOSPITAL_COMMUNITY): Payer: Medicare Other | Admitting: Anesthesiology

## 2022-07-05 ENCOUNTER — Ambulatory Visit (HOSPITAL_COMMUNITY): Payer: Medicare Other | Admitting: Certified Registered"

## 2022-07-05 DIAGNOSIS — D62 Acute posthemorrhagic anemia: Secondary | ICD-10-CM | POA: Diagnosis not present

## 2022-07-05 DIAGNOSIS — Z87891 Personal history of nicotine dependence: Secondary | ICD-10-CM

## 2022-07-05 DIAGNOSIS — I129 Hypertensive chronic kidney disease with stage 1 through stage 4 chronic kidney disease, or unspecified chronic kidney disease: Secondary | ICD-10-CM

## 2022-07-05 DIAGNOSIS — J449 Chronic obstructive pulmonary disease, unspecified: Secondary | ICD-10-CM | POA: Diagnosis not present

## 2022-07-05 DIAGNOSIS — Z9862 Peripheral vascular angioplasty status: Secondary | ICD-10-CM | POA: Diagnosis not present

## 2022-07-05 DIAGNOSIS — I739 Peripheral vascular disease, unspecified: Secondary | ICD-10-CM

## 2022-07-05 DIAGNOSIS — Z7902 Long term (current) use of antithrombotics/antiplatelets: Secondary | ICD-10-CM

## 2022-07-05 DIAGNOSIS — I10 Essential (primary) hypertension: Secondary | ICD-10-CM | POA: Diagnosis not present

## 2022-07-05 DIAGNOSIS — Z7982 Long term (current) use of aspirin: Secondary | ICD-10-CM | POA: Diagnosis not present

## 2022-07-05 DIAGNOSIS — I97618 Postprocedural hemorrhage and hematoma of a circulatory system organ or structure following other circulatory system procedure: Secondary | ICD-10-CM | POA: Diagnosis not present

## 2022-07-05 DIAGNOSIS — Z85828 Personal history of other malignant neoplasm of skin: Secondary | ICD-10-CM | POA: Diagnosis not present

## 2022-07-05 DIAGNOSIS — L7622 Postprocedural hemorrhage and hematoma of skin and subcutaneous tissue following other procedure: Secondary | ICD-10-CM | POA: Diagnosis not present

## 2022-07-05 DIAGNOSIS — N189 Chronic kidney disease, unspecified: Secondary | ICD-10-CM

## 2022-07-05 DIAGNOSIS — L7634 Postprocedural seroma of skin and subcutaneous tissue following other procedure: Secondary | ICD-10-CM

## 2022-07-05 DIAGNOSIS — Z7951 Long term (current) use of inhaled steroids: Secondary | ICD-10-CM

## 2022-07-05 DIAGNOSIS — Y838 Other surgical procedures as the cause of abnormal reaction of the patient, or of later complication, without mention of misadventure at the time of the procedure: Secondary | ICD-10-CM | POA: Diagnosis present

## 2022-07-05 DIAGNOSIS — K219 Gastro-esophageal reflux disease without esophagitis: Secondary | ICD-10-CM | POA: Diagnosis not present

## 2022-07-05 DIAGNOSIS — I70212 Atherosclerosis of native arteries of extremities with intermittent claudication, left leg: Secondary | ICD-10-CM | POA: Diagnosis not present

## 2022-07-05 DIAGNOSIS — Z79899 Other long term (current) drug therapy: Secondary | ICD-10-CM | POA: Diagnosis not present

## 2022-07-05 DIAGNOSIS — Z888 Allergy status to other drugs, medicaments and biological substances status: Secondary | ICD-10-CM | POA: Diagnosis not present

## 2022-07-05 DIAGNOSIS — Z48812 Encounter for surgical aftercare following surgery on the circulatory system: Secondary | ICD-10-CM | POA: Diagnosis not present

## 2022-07-05 DIAGNOSIS — I97648 Postprocedural seroma of a circulatory system organ or structure following other circulatory system procedure: Principal | ICD-10-CM | POA: Diagnosis present

## 2022-07-05 DIAGNOSIS — Z79891 Long term (current) use of opiate analgesic: Secondary | ICD-10-CM | POA: Diagnosis not present

## 2022-07-05 HISTORY — PX: APPLICATION OF WOUND VAC: SHX5189

## 2022-07-05 HISTORY — PX: INCISION AND DRAINAGE: SHX5863

## 2022-07-05 HISTORY — PX: GROIN DEBRIDEMENT: SHX5159

## 2022-07-05 HISTORY — PX: PATCH ANGIOPLASTY: SHX6230

## 2022-07-05 LAB — POCT I-STAT, CHEM 8
BUN: 15 mg/dL (ref 8–23)
BUN: 15 mg/dL (ref 8–23)
Calcium, Ion: 0.89 mmol/L — CL (ref 1.15–1.40)
Calcium, Ion: 0.95 mmol/L — ABNORMAL LOW (ref 1.15–1.40)
Chloride: 98 mmol/L (ref 98–111)
Chloride: 100 mmol/L (ref 98–111)
Creatinine, Ser: 1.4 mg/dL — ABNORMAL HIGH (ref 0.44–1.00)
Creatinine, Ser: 1.1 mg/dL — ABNORMAL HIGH (ref 0.44–1.00)
Glucose, Bld: 128 mg/dL — ABNORMAL HIGH (ref 70–99)
Glucose, Bld: 135 mg/dL — ABNORMAL HIGH (ref 70–99)
HCT: 34 % — ABNORMAL LOW (ref 36.0–46.0)
HCT: 32 % — ABNORMAL LOW (ref 36.0–46.0)
Hemoglobin: 11.6 g/dL — ABNORMAL LOW (ref 12.0–15.0)
Hemoglobin: 10.9 g/dL — ABNORMAL LOW (ref 12.0–15.0)
Potassium: 2.7 mmol/L — CL (ref 3.5–5.1)
Potassium: 3.5 mmol/L (ref 3.5–5.1)
Sodium: 137 mmol/L (ref 135–145)
Sodium: 135 mmol/L (ref 135–145)
TCO2: 25 mmol/L (ref 22–32)
TCO2: 24 mmol/L (ref 22–32)

## 2022-07-05 LAB — CBC
HCT: 31.4 % — ABNORMAL LOW (ref 36.0–46.0)
Hemoglobin: 10.5 g/dL — ABNORMAL LOW (ref 12.0–15.0)
MCH: 31.6 pg (ref 26.0–34.0)
MCHC: 33.4 g/dL (ref 30.0–36.0)
MCV: 94.6 fL (ref 80.0–100.0)
Platelets: 309 10*3/uL (ref 150–400)
RBC: 3.32 MIL/uL — ABNORMAL LOW (ref 3.87–5.11)
RDW: 13.7 % (ref 11.5–15.5)
WBC: 13.9 10*3/uL — ABNORMAL HIGH (ref 4.0–10.5)
nRBC: 0 % (ref 0.0–0.2)

## 2022-07-05 LAB — POCT I-STAT 7, (LYTES, BLD GAS, ICA,H+H)
Acid-base deficit: 1 mmol/L (ref 0.0–2.0)
Acid-base deficit: 2 mmol/L (ref 0.0–2.0)
Bicarbonate: 23.8 mmol/L (ref 20.0–28.0)
Bicarbonate: 24.9 mmol/L (ref 20.0–28.0)
Calcium, Ion: 0.92 mmol/L — ABNORMAL LOW (ref 1.15–1.40)
Calcium, Ion: 0.92 mmol/L — ABNORMAL LOW (ref 1.15–1.40)
HCT: 24 % — ABNORMAL LOW (ref 36.0–46.0)
HCT: 29 % — ABNORMAL LOW (ref 36.0–46.0)
Hemoglobin: 8.2 g/dL — ABNORMAL LOW (ref 12.0–15.0)
Hemoglobin: 9.9 g/dL — ABNORMAL LOW (ref 12.0–15.0)
O2 Saturation: 100 %
O2 Saturation: 100 %
Potassium: 3.7 mmol/L (ref 3.5–5.1)
Potassium: 3.8 mmol/L (ref 3.5–5.1)
Sodium: 134 mmol/L — ABNORMAL LOW (ref 135–145)
Sodium: 135 mmol/L (ref 135–145)
TCO2: 25 mmol/L (ref 22–32)
TCO2: 26 mmol/L (ref 22–32)
pCO2 arterial: 42.8 mmHg (ref 32–48)
pCO2 arterial: 44.7 mmHg (ref 32–48)
pH, Arterial: 7.353 (ref 7.35–7.45)
pH, Arterial: 7.353 (ref 7.35–7.45)
pO2, Arterial: 236 mmHg — ABNORMAL HIGH (ref 83–108)
pO2, Arterial: 241 mmHg — ABNORMAL HIGH (ref 83–108)

## 2022-07-05 LAB — POCT ACTIVATED CLOTTING TIME
Activated Clotting Time: 233 seconds
Activated Clotting Time: 277 seconds

## 2022-07-05 LAB — CREATININE, SERUM
Creatinine, Ser: 1.24 mg/dL — ABNORMAL HIGH (ref 0.44–1.00)
GFR, Estimated: 45 mL/min — ABNORMAL LOW (ref >60)

## 2022-07-05 LAB — PREPARE RBC (CROSSMATCH)

## 2022-07-05 SURGERY — DEBRIDEMENT, INGUINAL REGION
Anesthesia: General | Site: Groin | Laterality: Left

## 2022-07-05 SURGERY — INCISION AND DRAINAGE
Anesthesia: General | Laterality: Left

## 2022-07-05 MED ORDER — MIDAZOLAM HCL 2 MG/2ML IJ SOLN
INTRAMUSCULAR | Status: AC
  Filled 2022-07-05: qty 2

## 2022-07-05 MED ORDER — HEPARIN SODIUM (PORCINE) 1000 UNIT/ML IJ SOLN
INTRAMUSCULAR | Status: DC | PRN
  Administered 2022-07-05: 6000 [IU] via INTRAVENOUS
  Administered 2022-07-05: 1000 [IU] via INTRAVENOUS

## 2022-07-05 MED ORDER — DEXAMETHASONE SODIUM PHOSPHATE 10 MG/ML IJ SOLN
INTRAMUSCULAR | Status: AC
  Filled 2022-07-05: qty 1

## 2022-07-05 MED ORDER — AMLODIPINE BESYLATE 5 MG PO TABS
5.0000 mg | ORAL_TABLET | Freq: Every day | ORAL | Status: DC
  Administered 2022-07-05 – 2022-07-07 (×3): 5 mg via ORAL
  Filled 2022-07-05 (×3): qty 1

## 2022-07-05 MED ORDER — ALBUMIN HUMAN 5 % IV SOLN: INTRAVENOUS | Status: DC | PRN

## 2022-07-05 MED ORDER — ADULT MULTIVITAMIN W/MINERALS CH
1.0000 | ORAL_TABLET | Freq: Every day | ORAL | Status: DC
  Administered 2022-07-05 – 2022-07-07 (×3): 1 via ORAL
  Filled 2022-07-05 (×3): qty 1

## 2022-07-05 MED ORDER — ASPIRIN 81 MG PO TBEC
81.0000 mg | DELAYED_RELEASE_TABLET | Freq: Every day | ORAL | Status: DC
  Administered 2022-07-06 – 2022-07-07 (×2): 81 mg via ORAL
  Filled 2022-07-05 (×2): qty 1

## 2022-07-05 MED ORDER — KETOROLAC TROMETHAMINE 30 MG/ML IJ SOLN
INTRAMUSCULAR | Status: AC
  Filled 2022-07-05: qty 1

## 2022-07-05 MED ORDER — DULOXETINE HCL 20 MG PO CPEP
20.0000 mg | ORAL_CAPSULE | Freq: Every day | ORAL | Status: DC
  Administered 2022-07-05 – 2022-07-07 (×3): 20 mg via ORAL
  Filled 2022-07-05 (×3): qty 1

## 2022-07-05 MED ORDER — CHLORHEXIDINE GLUCONATE 0.12 % MT SOLN
15.0000 mL | Freq: Once | OROMUCOSAL | Status: AC
  Administered 2022-07-05: 15 mL via OROMUCOSAL
  Filled 2022-07-05: qty 15

## 2022-07-05 MED ORDER — BUPROPION HCL ER (XL) 150 MG PO TB24
150.0000 mg | ORAL_TABLET | Freq: Every day | ORAL | Status: DC
  Administered 2022-07-06: 150 mg via ORAL
  Administered 2022-07-07: 450 mg via ORAL
  Filled 2022-07-05 (×2): qty 1

## 2022-07-05 MED ORDER — ALBUTEROL SULFATE (2.5 MG/3ML) 0.083% IN NEBU: 3.0000 mL | INHALATION_SOLUTION | RESPIRATORY_TRACT | Status: DC | PRN

## 2022-07-05 MED ORDER — FENTANYL CITRATE (PF) 100 MCG/2ML IJ SOLN: 25.0000 ug | INTRAMUSCULAR | Status: DC | PRN

## 2022-07-05 MED ORDER — FENTANYL CITRATE (PF) 250 MCG/5ML IJ SOLN
INTRAMUSCULAR | Status: DC | PRN
  Administered 2022-07-05 (×4): 50 ug via INTRAVENOUS

## 2022-07-05 MED ORDER — GENTAMICIN SULFATE 40 MG/ML IJ SOLN
INTRAMUSCULAR | Status: AC
  Filled 2022-07-05: qty 2

## 2022-07-05 MED ORDER — VANCOMYCIN HCL 1000 MG IV SOLR
INTRAVENOUS | Status: AC
  Filled 2022-07-05: qty 20

## 2022-07-05 MED ORDER — LIDOCAINE 2% (20 MG/ML) 5 ML SYRINGE
INTRAMUSCULAR | Status: AC
  Filled 2022-07-05: qty 10

## 2022-07-05 MED ORDER — POLYETHYLENE GLYCOL 3350 17 G PO PACK: 17.0000 g | PACK | Freq: Every day | ORAL | Status: DC | PRN

## 2022-07-05 MED ORDER — MIDAZOLAM HCL 2 MG/2ML IJ SOLN
INTRAMUSCULAR | Status: DC | PRN
  Administered 2022-07-05: 1 mg via INTRAVENOUS

## 2022-07-05 MED ORDER — METOPROLOL TARTRATE 5 MG/5ML IV SOLN: 2.0000 mg | INTRAVENOUS | Status: DC | PRN

## 2022-07-05 MED ORDER — ACETAMINOPHEN 10 MG/ML IV SOLN: 1000.0000 mg | Freq: Once | INTRAVENOUS | Status: DC | PRN

## 2022-07-05 MED ORDER — HEPARIN 6000 UNIT IRRIGATION SOLUTION
Status: AC
  Filled 2022-07-05: qty 500

## 2022-07-05 MED ORDER — FENTANYL CITRATE (PF) 250 MCG/5ML IJ SOLN
INTRAMUSCULAR | Status: AC
  Filled 2022-07-05: qty 5

## 2022-07-05 MED ORDER — BUPROPION HCL ER (XL) 300 MG PO TB24
300.0000 mg | ORAL_TABLET | Freq: Every day | ORAL | Status: DC
  Administered 2022-07-06: 300 mg via ORAL
  Filled 2022-07-05 (×2): qty 1

## 2022-07-05 MED ORDER — ALUM & MAG HYDROXIDE-SIMETH 200-200-20 MG/5ML PO SUSP: 15.0000 mL | ORAL | Status: DC | PRN

## 2022-07-05 MED ORDER — SURGIFLO WITH THROMBIN (HEMOSTATIC MATRIX KIT) OPTIME
TOPICAL | Status: DC | PRN
  Administered 2022-07-05: 1 via TOPICAL

## 2022-07-05 MED ORDER — CLOPIDOGREL BISULFATE 75 MG PO TABS
75.0000 mg | ORAL_TABLET | Freq: Every day | ORAL | Status: DC
  Administered 2022-07-05: 75 mg via ORAL
  Filled 2022-07-05: qty 1

## 2022-07-05 MED ORDER — PROTAMINE SULFATE 10 MG/ML IV SOLN
INTRAVENOUS | Status: AC
  Filled 2022-07-05: qty 5

## 2022-07-05 MED ORDER — PROPOFOL 10 MG/ML IV BOLUS
INTRAVENOUS | Status: AC
  Filled 2022-07-05: qty 20

## 2022-07-05 MED ORDER — CEFAZOLIN SODIUM-DEXTROSE 1-4 GM/50ML-% IV SOLN
INTRAVENOUS | Status: DC | PRN
  Administered 2022-07-05: 2 g via INTRAVENOUS

## 2022-07-05 MED ORDER — HYDROCODONE-ACETAMINOPHEN 10-325 MG PO TABS: 1.0000 | ORAL_TABLET | ORAL | Status: DC | PRN

## 2022-07-05 MED ORDER — SODIUM CHLORIDE 0.9 % IV SOLN: INTRAVENOUS | Status: DC

## 2022-07-05 MED ORDER — PHENYLEPHRINE 80 MCG/ML (10ML) SYRINGE FOR IV PUSH (FOR BLOOD PRESSURE SUPPORT)
PREFILLED_SYRINGE | INTRAVENOUS | Status: AC
  Filled 2022-07-05: qty 20

## 2022-07-05 MED ORDER — LIDOCAINE 2% (20 MG/ML) 5 ML SYRINGE
INTRAMUSCULAR | Status: AC
  Filled 2022-07-05: qty 5

## 2022-07-05 MED ORDER — ACETAMINOPHEN 325 MG PO TABS
325.0000 mg | ORAL_TABLET | ORAL | Status: DC | PRN
  Filled 2022-07-05: qty 2

## 2022-07-05 MED ORDER — PROPOFOL 10 MG/ML IV BOLUS
INTRAVENOUS | Status: DC | PRN
  Administered 2022-07-05: 20 mg via INTRAVENOUS
  Administered 2022-07-05: 110 mg via INTRAVENOUS

## 2022-07-05 MED ORDER — BISOPROLOL FUMARATE 5 MG PO TABS
5.0000 mg | ORAL_TABLET | Freq: Every day | ORAL | Status: DC
  Administered 2022-07-06 – 2022-07-07 (×2): 5 mg via ORAL
  Filled 2022-07-05 (×3): qty 1

## 2022-07-05 MED ORDER — ORAL CARE MOUTH RINSE: 15.0000 mL | Freq: Once | OROMUCOSAL | Status: AC

## 2022-07-05 MED ORDER — CEFAZOLIN SODIUM-DEXTROSE 2-4 GM/100ML-% IV SOLN
2.0000 g | INTRAVENOUS | Status: AC
  Administered 2022-07-05: 2 g via INTRAVENOUS
  Filled 2022-07-05: qty 100

## 2022-07-05 MED ORDER — POTASSIUM CHLORIDE 10 MEQ/100ML IV SOLN
10.0000 meq | Freq: Once | INTRAVENOUS | Status: DC
  Filled 2022-07-05: qty 100

## 2022-07-05 MED ORDER — ROCURONIUM BROMIDE 10 MG/ML (PF) SYRINGE
PREFILLED_SYRINGE | INTRAVENOUS | Status: DC | PRN
  Administered 2022-07-05: 60 mg via INTRAVENOUS
  Administered 2022-07-05: 20 mg via INTRAVENOUS

## 2022-07-05 MED ORDER — LACTATED RINGERS IV SOLN: INTRAVENOUS | Status: DC | PRN

## 2022-07-05 MED ORDER — PHENYLEPHRINE HCL-NACL 20-0.9 MG/250ML-% IV SOLN
INTRAVENOUS | Status: DC | PRN
  Administered 2022-07-05: 40 ug/min via INTRAVENOUS

## 2022-07-05 MED ORDER — PHENYLEPHRINE 80 MCG/ML (10ML) SYRINGE FOR IV PUSH (FOR BLOOD PRESSURE SUPPORT)
PREFILLED_SYRINGE | INTRAVENOUS | Status: DC | PRN
  Administered 2022-07-05 (×4): 80 ug via INTRAVENOUS

## 2022-07-05 MED ORDER — GENTAMICIN SULFATE 40 MG/ML IJ SOLN
INTRAMUSCULAR | Status: DC | PRN
  Administered 2022-07-05: 120 mg

## 2022-07-05 MED ORDER — LEFLUNOMIDE 10 MG PO TABS
20.0000 mg | ORAL_TABLET | Freq: Every day | ORAL | Status: DC
  Administered 2022-07-06: 20 mg via ORAL
  Filled 2022-07-05 (×4): qty 2

## 2022-07-05 MED ORDER — 0.9 % SODIUM CHLORIDE (POUR BTL) OPTIME
TOPICAL | Status: DC | PRN
  Administered 2022-07-05: 1000 mL

## 2022-07-05 MED ORDER — HYDROXYCHLOROQUINE SULFATE 200 MG PO TABS
200.0000 mg | ORAL_TABLET | Freq: Every day | ORAL | Status: DC
  Administered 2022-07-05 – 2022-07-07 (×3): 200 mg via ORAL
  Filled 2022-07-05 (×3): qty 1

## 2022-07-05 MED ORDER — PROPOFOL 10 MG/ML IV BOLUS
INTRAVENOUS | Status: DC | PRN
  Administered 2022-07-05: 140 mg via INTRAVENOUS

## 2022-07-05 MED ORDER — BISACODYL 10 MG RE SUPP: 10.0000 mg | Freq: Every day | RECTAL | Status: DC | PRN

## 2022-07-05 MED ORDER — CHLORHEXIDINE GLUCONATE 4 % EX LIQD: 60.0000 mL | Freq: Once | CUTANEOUS | Status: DC

## 2022-07-05 MED ORDER — SODIUM CHLORIDE (PF) 0.9 % IJ SOLN
INTRAMUSCULAR | Status: AC
  Filled 2022-07-05: qty 10

## 2022-07-05 MED ORDER — ACETAMINOPHEN 650 MG RE SUPP: 325.0000 mg | RECTAL | Status: DC | PRN

## 2022-07-05 MED ORDER — CEFAZOLIN SODIUM 1 G IJ SOLR
INTRAMUSCULAR | Status: AC
  Filled 2022-07-05: qty 20

## 2022-07-05 MED ORDER — ROSUVASTATIN CALCIUM 5 MG PO TABS
5.0000 mg | ORAL_TABLET | Freq: Every day | ORAL | Status: DC
  Administered 2022-07-05 – 2022-07-07 (×3): 5 mg via ORAL
  Filled 2022-07-05 (×3): qty 1

## 2022-07-05 MED ORDER — ROCURONIUM BROMIDE 10 MG/ML (PF) SYRINGE
PREFILLED_SYRINGE | INTRAVENOUS | Status: AC
  Filled 2022-07-05: qty 10

## 2022-07-05 MED ORDER — VANCOMYCIN HCL 1000 MG IV SOLR
INTRAVENOUS | Status: DC | PRN
  Administered 2022-07-05: 1000 mg via TOPICAL

## 2022-07-05 MED ORDER — GUAIFENESIN-DM 100-10 MG/5ML PO SYRP: 15.0000 mL | ORAL_SOLUTION | ORAL | Status: DC | PRN

## 2022-07-05 MED ORDER — FAMOTIDINE 20 MG PO TABS
20.0000 mg | ORAL_TABLET | Freq: Every day | ORAL | Status: DC
  Administered 2022-07-05 – 2022-07-07 (×3): 20 mg via ORAL
  Filled 2022-07-05 (×3): qty 1

## 2022-07-05 MED ORDER — SUGAMMADEX SODIUM 200 MG/2ML IV SOLN
INTRAVENOUS | Status: DC | PRN
  Administered 2022-07-05: 200 mg via INTRAVENOUS

## 2022-07-05 MED ORDER — PHENYLEPHRINE 80 MCG/ML (10ML) SYRINGE FOR IV PUSH (FOR BLOOD PRESSURE SUPPORT)
PREFILLED_SYRINGE | INTRAVENOUS | Status: DC | PRN
  Administered 2022-07-05: 40 ug via INTRAVENOUS
  Administered 2022-07-05: 80 ug via INTRAVENOUS
  Administered 2022-07-05: 120 ug via INTRAVENOUS
  Administered 2022-07-05: 80 ug via INTRAVENOUS

## 2022-07-05 MED ORDER — LIDOCAINE 2% (20 MG/ML) 5 ML SYRINGE
INTRAMUSCULAR | Status: DC | PRN
  Administered 2022-07-05: 60 mg via INTRAVENOUS

## 2022-07-05 MED ORDER — HEPARIN 6000 UNIT IRRIGATION SOLUTION
Status: DC | PRN
  Administered 2022-07-05: 1

## 2022-07-05 MED ORDER — GENTAMICIN SULFATE 40 MG/ML IJ SOLN
INTRAMUSCULAR | Status: AC
  Filled 2022-07-05: qty 4

## 2022-07-05 MED ORDER — HYDRALAZINE HCL 20 MG/ML IJ SOLN: 5.0000 mg | INTRAMUSCULAR | Status: DC | PRN

## 2022-07-05 MED ORDER — VANCOMYCIN HCL 500 MG IV SOLR
INTRAVENOUS | Status: AC
  Filled 2022-07-05: qty 10

## 2022-07-05 MED ORDER — CALCIUM CHLORIDE 10 % IV SOLN
INTRAVENOUS | Status: AC
  Filled 2022-07-05: qty 10

## 2022-07-05 MED ORDER — ONDANSETRON HCL 4 MG/2ML IJ SOLN: 4.0000 mg | Freq: Four times a day (QID) | INTRAMUSCULAR | Status: DC | PRN

## 2022-07-05 MED ORDER — HYDROMORPHONE HCL 1 MG/ML IJ SOLN
0.5000 mg | INTRAMUSCULAR | Status: DC | PRN
  Filled 2022-07-05 (×2): qty 0.5

## 2022-07-05 MED ORDER — TRIAMTERENE-HCTZ 37.5-25 MG PO TABS
0.5000 | ORAL_TABLET | Freq: Every day | ORAL | Status: DC
  Administered 2022-07-06 – 2022-07-07 (×2): 0.5 via ORAL
  Filled 2022-07-05 (×3): qty 0.5

## 2022-07-05 MED ORDER — POTASSIUM CHLORIDE 10 MEQ/100ML IV SOLN
10.0000 meq | INTRAVENOUS | Status: DC
  Administered 2022-07-05 (×2): 10 meq via INTRAVENOUS

## 2022-07-05 MED ORDER — POTASSIUM CHLORIDE 10 MEQ/100ML IV SOLN
INTRAVENOUS | Status: AC
  Administered 2022-07-05: 10 meq via INTRAVENOUS
  Filled 2022-07-05: qty 400

## 2022-07-05 MED ORDER — PHENOL 1.4 % MT LIQD: 1.0000 | OROMUCOSAL | Status: DC | PRN

## 2022-07-05 MED ORDER — FENTANYL CITRATE (PF) 250 MCG/5ML IJ SOLN
INTRAMUSCULAR | Status: DC | PRN
  Administered 2022-07-05 (×3): 50 ug via INTRAVENOUS

## 2022-07-05 MED ORDER — HEPARIN SODIUM (PORCINE) 5000 UNIT/ML IJ SOLN: 5000.0000 [IU] | Freq: Three times a day (TID) | INTRAMUSCULAR | Status: DC

## 2022-07-05 MED ORDER — SODIUM CHLORIDE 0.9 % IV SOLN: INTRAVENOUS | Status: AC

## 2022-07-05 MED ORDER — CALCIUM CHLORIDE 10 % IV SOLN
INTRAVENOUS | Status: DC | PRN
  Administered 2022-07-05 (×2): 250 mg via INTRAVENOUS

## 2022-07-05 MED ORDER — DEXAMETHASONE SODIUM PHOSPHATE 10 MG/ML IJ SOLN
INTRAMUSCULAR | Status: DC | PRN
  Administered 2022-07-05: 10 mg via INTRAVENOUS

## 2022-07-05 MED ORDER — PROTAMINE SULFATE 10 MG/ML IV SOLN
INTRAVENOUS | Status: DC | PRN
  Administered 2022-07-05: 50 mg via INTRAVENOUS

## 2022-07-05 MED ORDER — ONDANSETRON HCL 4 MG/2ML IJ SOLN
INTRAMUSCULAR | Status: DC | PRN
  Administered 2022-07-05: 4 mg via INTRAVENOUS

## 2022-07-05 MED ORDER — ONDANSETRON HCL 4 MG/2ML IJ SOLN
INTRAMUSCULAR | Status: AC
  Filled 2022-07-05: qty 2

## 2022-07-05 MED ORDER — LABETALOL HCL 5 MG/ML IV SOLN: 10.0000 mg | INTRAVENOUS | Status: DC | PRN

## 2022-07-05 MED ORDER — POTASSIUM CHLORIDE CRYS ER 20 MEQ PO TBCR: 20.0000 meq | EXTENDED_RELEASE_TABLET | Freq: Once | ORAL | Status: DC

## 2022-07-05 MED ORDER — LOSARTAN POTASSIUM 25 MG PO TABS: 25.0000 mg | ORAL_TABLET | Freq: Every day | ORAL | Status: DC

## 2022-07-05 MED ORDER — PANTOPRAZOLE SODIUM 40 MG PO TBEC
40.0000 mg | DELAYED_RELEASE_TABLET | Freq: Every day | ORAL | Status: DC
  Administered 2022-07-06 – 2022-07-07 (×2): 40 mg via ORAL
  Filled 2022-07-05 (×2): qty 1

## 2022-07-05 SURGICAL SUPPLY — 56 items
ADH SKN CLS APL DERMABOND .7 (GAUZE/BANDAGES/DRESSINGS) ×2
AGENT HMST KT MTR STRL THRMB (HEMOSTASIS) ×2
BAG COUNTER SPONGE SURGICOUNT (BAG) ×2 IMPLANT
BAG SPNG CNTER NS LX DISP (BAG) ×2
CANISTER SUCT 3000ML PPV (MISCELLANEOUS) ×2 IMPLANT
CANISTER WOUND CARE 500ML ATS (WOUND CARE) IMPLANT
CATH EMB 3FR 80CM (CATHETERS) IMPLANT
CLIP TI MEDIUM 6 (CLIP) IMPLANT
CLIP TI WIDE RED SMALL 6 (CLIP) IMPLANT
CNTNR URN SCR LID CUP LEK RST (MISCELLANEOUS) IMPLANT
CONT SPEC 4OZ STRL OR WHT (MISCELLANEOUS) ×2
DERMABOND ADVANCED .7 DNX12 (GAUZE/BANDAGES/DRESSINGS) IMPLANT
DRESSING VERAFLO CLEANS CC MED (GAUZE/BANDAGES/DRESSINGS) IMPLANT
DRSG VERAFLO CLEANSE CC MED (GAUZE/BANDAGES/DRESSINGS) ×2
ELECT REM PT RETURN 9FT ADLT (ELECTROSURGICAL) ×2
ELECTRODE REM PT RTRN 9FT ADLT (ELECTROSURGICAL) ×2 IMPLANT
GAUZE SPONGE 4X4 12PLY STRL (GAUZE/BANDAGES/DRESSINGS) ×2 IMPLANT
GLOVE BIOGEL PI IND STRL 7.0 (GLOVE) IMPLANT
GLOVE BIOGEL PI IND STRL 7.5 (GLOVE) IMPLANT
GLOVE SS BIOGEL STRL SZ 7 (GLOVE) ×2 IMPLANT
GLOVE SURG ENC MOIS LTX SZ7 (GLOVE) IMPLANT
GLOVE SURG SS PI 7.0 STRL IVOR (GLOVE) IMPLANT
GOWN STRL REUS W/ TWL LRG LVL3 (GOWN DISPOSABLE) ×6 IMPLANT
GOWN STRL REUS W/ TWL XL LVL3 (GOWN DISPOSABLE) IMPLANT
GOWN STRL REUS W/TWL LRG LVL3 (GOWN DISPOSABLE) ×4
GOWN STRL REUS W/TWL XL LVL3 (GOWN DISPOSABLE) ×4
GRAFT SKIN WND MICRO 38 (Tissue) IMPLANT
KIT BASIN OR (CUSTOM PROCEDURE TRAY) ×2 IMPLANT
KIT STIMULAN RAPID CURE 5CC (Orthopedic Implant) IMPLANT
KIT TURNOVER KIT B (KITS) ×2 IMPLANT
NDL 18GX1X1/2 (RX/OR ONLY) (NEEDLE) IMPLANT
NEEDLE 18GX1X1/2 (RX/OR ONLY) (NEEDLE) ×2 IMPLANT
NS IRRIG 1000ML POUR BTL (IV SOLUTION) ×2 IMPLANT
PACK PERIPHERAL VASCULAR (CUSTOM PROCEDURE TRAY) IMPLANT
PAD ARMBOARD 7.5X6 YLW CONV (MISCELLANEOUS) ×4 IMPLANT
PAD NEG PRESSURE SENSATRAC (MISCELLANEOUS) IMPLANT
SHEATH PROBE COVER 6X72 (BAG) IMPLANT
SPONGE T-LAP 18X18 ~~LOC~~+RFID (SPONGE) IMPLANT
STAPLER VISISTAT 35W (STAPLE) IMPLANT
STOPCOCK 4 WAY LG BORE MALE ST (IV SETS) IMPLANT
SURGIFLO W/THROMBIN 8M KIT (HEMOSTASIS) IMPLANT
SUT ETHILON 3 0 PS 1 (SUTURE) IMPLANT
SUT MNCRL AB 4-0 PS2 18 (SUTURE) IMPLANT
SUT PROLENE 5 0 C 1 24 (SUTURE) IMPLANT
SUT PROLENE 5 0 C 1 36 (SUTURE) IMPLANT
SUT PROLENE 6 0 BV (SUTURE) IMPLANT
SUT SILK 0 TIES 10X30 (SUTURE) IMPLANT
SUT VIC AB 2-0 CT1 27 (SUTURE) ×2
SUT VIC AB 2-0 CT1 TAPERPNT 27 (SUTURE) IMPLANT
SUT VIC AB 2-0 CTX 36 (SUTURE) IMPLANT
SUT VIC AB 3-0 SH 27 (SUTURE) ×6
SUT VIC AB 3-0 SH 27X BRD (SUTURE) IMPLANT
SYR 3ML LL SCALE MARK (SYRINGE) IMPLANT
TOWEL GREEN STERILE (TOWEL DISPOSABLE) ×2 IMPLANT
TRAY CATH INTERMITTENT SS 16FR (CATHETERS) IMPLANT
WATER STERILE IRR 1000ML POUR (IV SOLUTION) ×2 IMPLANT

## 2022-07-05 SURGICAL SUPPLY — 41 items
BAG COUNTER SPONGE SURGICOUNT (BAG) ×1 IMPLANT
BAG SPNG CNTER NS LX DISP (BAG) ×1
CANISTER SUCT 3000ML PPV (MISCELLANEOUS) ×1 IMPLANT
CANISTER WOUND CARE 500ML ATS (WOUND CARE) IMPLANT
COVER SURGICAL LIGHT HANDLE (MISCELLANEOUS) ×1 IMPLANT
DRAPE INCISE IOBAN 66X45 STRL (DRAPES) ×1 IMPLANT
DRAPE ORTHO SPLIT 77X108 STRL (DRAPES) ×1
DRAPE SURG ORHT 6 SPLT 77X108 (DRAPES) ×1 IMPLANT
DRESSING VERAFLO CLEANS CC MED (GAUZE/BANDAGES/DRESSINGS) IMPLANT
DRSG VAC ATS LRG SENSATRAC (GAUZE/BANDAGES/DRESSINGS) ×2 IMPLANT
DRSG VAC ATS MED SENSATRAC (GAUZE/BANDAGES/DRESSINGS) IMPLANT
DRSG VERAFLO CLEANSE CC MED (GAUZE/BANDAGES/DRESSINGS) ×1
ELECT REM PT RETURN 9FT ADLT (ELECTROSURGICAL) ×1
ELECTRODE REM PT RTRN 9FT ADLT (ELECTROSURGICAL) ×1 IMPLANT
GLOVE BIO SURGEON STRL SZ7.5 (GLOVE) ×1 IMPLANT
GLOVE SURG SS PI 7.5 STRL IVOR (GLOVE) ×1 IMPLANT
GOWN STRL REUS W/ TWL LRG LVL3 (GOWN DISPOSABLE) ×2 IMPLANT
GOWN STRL REUS W/ TWL XL LVL3 (GOWN DISPOSABLE) ×2 IMPLANT
GOWN STRL REUS W/TWL LRG LVL3 (GOWN DISPOSABLE) ×2
GOWN STRL REUS W/TWL XL LVL3 (GOWN DISPOSABLE) ×2
GRAFT SKIN WND MICRO 38 (Tissue) IMPLANT
KIT BASIN OR (CUSTOM PROCEDURE TRAY) ×1 IMPLANT
KIT STIMULAN RAPID CURE 5CC (Orthopedic Implant) IMPLANT
KIT TURNOVER KIT B (KITS) ×1 IMPLANT
NDL HYPO 25GX1X1/2 BEV (NEEDLE) IMPLANT
NEEDLE HYPO 25GX1X1/2 BEV (NEEDLE) ×1 IMPLANT
NS IRRIG 1000ML POUR BTL (IV SOLUTION) ×1 IMPLANT
PACK GENERAL/GYN (CUSTOM PROCEDURE TRAY) ×1 IMPLANT
PACK PERIPHERAL VASCULAR (CUSTOM PROCEDURE TRAY) IMPLANT
PAD ARMBOARD 7.5X6 YLW CONV (MISCELLANEOUS) ×2 IMPLANT
PAD NEG PRESSURE SENSATRAC (MISCELLANEOUS) IMPLANT
STAPLER VISISTAT 35W (STAPLE) IMPLANT
SUT ETHILON 2 0 PSLX (SUTURE) IMPLANT
SUT VIC AB 2-0 CT1 27 (SUTURE) ×2
SUT VIC AB 2-0 CT1 36 (SUTURE) IMPLANT
SUT VIC AB 2-0 CT1 TAPERPNT 27 (SUTURE) IMPLANT
SWAB COLLECTION DEVICE MRSA (MISCELLANEOUS) IMPLANT
SWAB CULTURE ESWAB REG 1ML (MISCELLANEOUS) IMPLANT
SYR 3ML LL SCALE MARK (SYRINGE) IMPLANT
TOWEL GREEN STERILE (TOWEL DISPOSABLE) ×1 IMPLANT
WATER STERILE IRR 1000ML POUR (IV SOLUTION) IMPLANT

## 2022-07-05 NOTE — Anesthesia Postprocedure Evaluation (Signed)
Anesthesia Post Note  Patient: Donna [name]  Procedure(s) Performed: LEFT GROIN WASHOUT (Left) APPLICATION OF WOUND VAC (Left)     Patient location during evaluation: PACU Anesthesia Type: General Level of consciousness: awake and alert Pain management: pain level controlled Vital Signs Assessment: post-procedure vital signs reviewed and stable Respiratory status: spontaneous breathing, nonlabored ventilation, respiratory function stable and patient connected to nasal cannula oxygen Cardiovascular status: blood pressure returned to baseline and stable Postop Assessment: no apparent nausea or vomiting Anesthetic complications: no   No notable events documented.  Last Vitals:  Vitals:   07/05/22 2000 07/05/22 2016  BP: 124/78 120/76  Pulse: 79 79  Resp: 14 16  Temp: 36.7 C 36.8 C  SpO2: 95% 97%    Last Pain:  Vitals:   07/05/22 2016  TempSrc: Oral  PainSc:                  March Rummage Malana Eberwein

## 2022-07-05 NOTE — Interval H&P Note (Signed)
History and Physical Interval Note:  07/05/2022 10:46 AM  Donna [name]  has presented today for surgery, with the diagnosis of Non-healing left groin seroma.  The various methods of treatment have been discussed with the patient and family. After consideration of risks, benefits and other options for treatment, the patient has consented to  Procedure(s): LEFT GROIN WASHOUT (Left) APPLICATION OF WOUND VAC (Left) as a surgical intervention.  The patient's history has been reviewed, patient examined, no change in status, stable for surgery.  I have reviewed the patient's chart and labs.  Questions were answered to the patient's satisfaction.     [name]

## 2022-07-05 NOTE — Progress Notes (Signed)
The patient developed acute bleeding from her left groin which filled up the wound VAC.  The nursing staff immediately applied pressure.  I spoke with the patient and her husband and stated that we needed to return to the operating room emergently.  I suspect that her PACs although not bleeding at the time of her initial surgery was likely the culprit.  This was also likely from a subclinical infection.  I discussed removing the patch and replacing it with vein.  All questions were answered.  She will be taken the operating room emergently.  [name]

## 2022-07-05 NOTE — Transfer of Care (Signed)
Immediate Anesthesia Transfer of Care Note  Patient: Donna [name]  Procedure(s) Performed: LEFT GROIN WASHOUT (Left) APPLICATION OF WOUND VAC (Left)  Patient Location: PACU  Anesthesia Type:General  Level of Consciousness: awake and alert   Airway & Oxygen Therapy: Patient Spontanous Breathing  Post-op Assessment: Report given to RN and Post -op Vital signs reviewed and stable  Post vital signs: Reviewed and stable  Last Vitals:  Vitals Value Taken Time  BP 137/60 07/05/22 1251  Temp 37 C 07/05/22 1250  Pulse 78 07/05/22 1252  Resp 17 07/05/22 1252  SpO2 94 % 07/05/22 1252  Vitals shown include unvalidated device data.  Last Pain:  Vitals:   07/05/22 0930  TempSrc:   PainSc: 6       Patients Stated Pain Goal: 0 (85/90/93 1121)  Complications: No notable events documented.

## 2022-07-05 NOTE — Anesthesia Procedure Notes (Signed)
Arterial Line Insertion Start/End12/12/2021 4:30 PM, 07/05/2022 4:35 PM Performed by: Darral Dash, DO, anesthesiologist  Patient location: OR. Preanesthetic checklist: patient identified, IV checked, site marked, monitors and equipment checked and anesthesia consent Emergency situation Patient sedated Left, radial was placed Catheter size: 20 G Hand hygiene performed  and maximum sterile barriers used   Attempts: 1 Procedure performed using ultrasound guided technique. Ultrasound Notes:anatomy identified, needle tip was noted to be adjacent to the nerve/plexus identified and no ultrasound evidence of intravascular and/or intraneural injection Following insertion, dressing applied and Biopatch. Post procedure assessment: normal and unchanged  Patient tolerated the procedure well with no immediate complications.

## 2022-07-05 NOTE — Op Note (Signed)
    Patient name: Donna [name] MRN: [id number] DOB: Dec 12, 1945 Sex: female  07/05/2022 Pre-operative Diagnosis: Left groin seroma Post-operative diagnosis:  Same Surgeon:  [name] Assistants:  Leontine Locket, PA Procedure:   #1: Irrigation and debridement of left groin seroma including subcutaneous tissue and fascia.   #2Leroy Kennedy xenograft placement of first 38 cm ([id number])   #3: Placement of wound VAC (6 x 1 x 1 cm   #4: Placement of antibiotic impregnated beads Anesthesia:  General Blood Loss:  minimal Specimens: Cultures were sent  Findings: The bovine patch was exposed at the base of the wound.  I looked at this carefully.  All suture lines were intact.  There was no evidence of bleeding.  There was no evidence of infection.  After extensive debridement of nonviable tissue and irrigation, I obtained hemostasis.  I closed a deep layer of tissue over top of the patch and then placed antibiotic beads and Kerecis within the wound.  A wound VAC was then placed.  The depth of the wound was 6 x 1 x 1 cm  Indications: This is a 76 year old female who has previously undergone left femoral endarterectomy with bovine patch angioplasty.  I saw her in the office last week and she had a large seroma which was draining.  I probed this with a Q-tip and drained out approximately half a liter of fluid.  I placed her on antibiotics and she returned this week for evaluation.  She was continuing to have drainage and so operative exploration was recommended.  Procedure:  The patient was identified in the holding area and taken to [place] [facility]  The patient was then placed supine on the table. general anesthesia was administered.  The patient was prepped and draped in the usual sterile fashion.  A time out was called and antibiotics were administered.  A PA was necessary to expedite the procedure and assist with technical details.  She helped with suction and retraction as well as application of the wound  VAC.  A number 10 blade was used to open the patient's previous incision.  The sutures were removed and I debrided nonviable tissue including skin and subcutaneous tissue as well as fascia.  After the wound was open, I could visualize the bovine patch at the base of the wound.  I carefully dissected out the tissue around the patch.  The suture line was intact with no evidence of bleeding.  Cultures were sent of the fluid.  I did not see any obvious infection.  I felt that it would be safe to go ahead and closed the wound.  I reapproximated soft tissue over top of the bovine patch after extensive irrigation.  I then placed antibiotic impregnated beads as well as Kerecis powder within the wound.  A blue sponge wound VAC was placed over the 6 x 1 x 1 cm wound.  A good seal was obtained.  She was successfully extubated and taken recovery in stable condition.   Disposition: To PACU stable.   Theotis Burrow, M.D., [facility] [place] Office: [phone] Pager:  [phone]

## 2022-07-05 NOTE — Anesthesia Postprocedure Evaluation (Signed)
Anesthesia Post Note  Patient: KESSLER KOPINSKI  Procedure(s) Performed: LEFT Karl Ito WITH STIMULAN ANTIBIOTIC BEAD AND KERECIS FISH SKIN GRAFT APPLICATION (Left) APPLICATION OF WOUND VAC (Left: Groin) VEIN PATCH ANGIOPLASTY (Left: Groin)     Patient location during evaluation: PACU Anesthesia Type: General Level of consciousness: awake and alert Pain management: pain level controlled Vital Signs Assessment: post-procedure vital signs reviewed and stable Respiratory status: spontaneous breathing, nonlabored ventilation, respiratory function stable and patient connected to nasal cannula oxygen Cardiovascular status: blood pressure returned to baseline and stable Postop Assessment: no apparent nausea or vomiting Anesthetic complications: no  No notable events documented.  Last Vitals:  Vitals:   07/05/22 2000 07/05/22 2016  BP: 124/78 120/76  Pulse: 79 79  Resp: 14 16  Temp: 36.7 C 36.8 C  SpO2: 95% 97%    Last Pain:  Vitals:   07/05/22 2016  TempSrc: Oral  PainSc:                  Tiajuana Amass

## 2022-07-05 NOTE — Anesthesia Procedure Notes (Signed)
Procedure Name: LMA Insertion Date/Time: 07/05/2022 11:37 AM  Performed by: Anastasio Auerbach, CRNAPre-anesthesia Checklist: Patient identified, Emergency Drugs available, Suction available and Patient being monitored Patient Re-evaluated:Patient Re-evaluated prior to induction Oxygen Delivery Method: Circle system utilized Preoxygenation: Pre-oxygenation with 100% oxygen Induction Type: IV induction Ventilation: Mask ventilation without difficulty LMA: LMA with gastric port inserted LMA Size: 4.0 Placement Confirmation: breath sounds checked- equal and bilateral and positive ETCO2 Tube secured with: Tape

## 2022-07-05 NOTE — Progress Notes (Signed)
This RN noticed that wound vac was full and when pulled blankets back saw that left groin was bleeding out. This RN began to hold pressure and charge nurse was called. MD came up to floor and patient was taken back down to OR.  [place] C Mareo Portilla

## 2022-07-05 NOTE — Op Note (Signed)
Patient name: Donna [name] MRN: [id number] DOB: March 07, 1946 Sex: female  07/05/2022 Pre-operative Diagnosis: Bleeding from left groin Post-operative diagnosis:  Same Surgeon:  [name] Assistants:  Leontine Locket, PA Procedure:   #1: Exploration of recent left groin surgery   #2: Removal of left femoral bovine pericardial patch and replacement with saphenous vein patch angioplasty   #3: Harvest of left great saphenous vein   #4: Placement of antibiotic impregnated beads   #5: Placement of Kerecis xenograft, first 38 cm (Q41 5 8)   #6: Placement of wound VAC blue sponge (6 x 1 x 1 cm   #7: Redo left femoral artery exposure Anesthesia:  General Blood Loss: Approximate 2 unit blood loss prior to surgery.  Minimal blood loss during surgery. Specimens: None  Findings: Initially upon opening the wound, there was no active bleeding.  I explored the patch extensively.  There was one area on the medial side that showed evidence of possible recent bleed.  This was closed with a 5-0 Prolene suture.  I removed the bovine patch and replaced it with a vein patch.  I again placed antibiotic beads and Kerecis powder as well as a wound VAC.  Indications: This is a 76 year old female who is approximately 2 weeks removed from left femoral endarterectomy with bovine patch angioplasty.  She had a persistent seroma and came in for surgical exploration this morning.  There was no obvious infection although the patch was exposed at the base of the wound.  There was no active bleeding and so I debrided the nonviable tissue and closed tissue over the patch and placed Kerecis and antibiotic beads as well as a wound VAC.  After she had been on the floor for a while, she developed significant bleeding from her left groin which required manual pressure for hemostasis.  She remained hemodynamically stable.  She was returned to the operating room for emergent exploration.  Procedure:  The patient was identified in the  holding area and taken to [place] 16  The patient was then placed supine on the table. general anesthesia was administered.  The patient was prepped and draped in the usual sterile fashion.  A time out was called and antibiotics were administered.  A PA was necessary to expedite the procedure and assist with technical details.  She helped with suction and retraction as well as the vein harvest and following suture for the vein patch angioplasty.  She also help with wound VAC application.  The patient's wound VAC was removed.  The sutures reapproximating the tissue were also removed until I exposed the bovine patch.  There was no active bleeding.  I then proceeded to get proximal and distal control by getting Vesseloops around the proximal common femoral artery as well as the superficial femoral and profundofemoral artery.  The artery and patch for the most part were well incorporated and so dissection was somewhat tedious once I had adequate exposure, I elected to harvest the great saphenous vein on the right.  This was done through a longitudinal incision just below the oblique incision where it had been working.  The vein was healthy.  It was fully mobilized from the saphenofemoral junction distally.  Next, the patient was fully heparinized.  I then occluded the vessels with vascular clamps.  I used a #11 blade to remove the bovine patch.  The patch was sent for culture.  After the patch was removed in its entirety, I proceeded to harvest the right  great saphenous vein.  It was ligated proximally distally with 2-0 silk ties.  The vein was then opened longitudinally.  It was used for patch.  Patch angioplasty was performed with running 5-0 Prolene.  Prior to completion, the appropriate flushing maneuvers were performed and the anastomosis was completed.  Blood flow was reestablished to the left leg.  The wound was hemostatic.  There were brisk Doppler signals in the superficial femoral and profundofemoral  artery.  I then reversed the heparin with 50 mg of protamine.  I then irrigated the wound.  There is no obvious bleeding.  I then reapproximated tissue over top of the vein patch.  I then placed vancomycin and gentamicin impregnated antibiotic beads as well as Kerecis powder within the wound.  I then reapproximated the subcutaneous tissue with interrupted 3-0 Vicryl.  A blue sponge wound VAC was then placed.  We got a good seal.  It will be set to 75 mm pressure.  The patient tolerated the procedure well.  There were no immediate complications.   Disposition: To PACU stable.   Theotis Burrow, M.D., [facility] [place] Office: [phone] Pager:  [phone]

## 2022-07-05 NOTE — Anesthesia Preprocedure Evaluation (Addendum)
Anesthesia Evaluation  Patient identified by MRN, date of birth, ID band Patient awake    Reviewed: Allergy & Precautions, NPO status , Patient's Chart, lab work & pertinent test results  Airway Mallampati: II  TM Distance: >3 FB Neck ROM: Full    Dental no notable dental hx.    Pulmonary COPD,  COPD inhaler, former smoker   Pulmonary exam normal        Cardiovascular hypertension, Pt. on medications + Peripheral Vascular Disease   Rhythm:Regular Rate:Normal     Neuro/Psych    Depression       GI/Hepatic Neg liver ROS, hiatal hernia,GERD  ,,  Endo/Other  negative endocrine ROS    Renal/GU CRFRenal disease  negative genitourinary   Musculoskeletal  (+) Arthritis ,    Abdominal Normal abdominal exam  (+)   Peds  Hematology negative hematology ROS (+)   Anesthesia Other Findings   Reproductive/Obstetrics                             Anesthesia Physical Anesthesia Plan  ASA: 3 and emergent  Anesthesia Plan: General   Post-op Pain Management:    Induction: Intravenous  PONV Risk Score and Plan: 3 and Ondansetron, Dexamethasone and Treatment may vary due to age or medical condition  Airway Management Planned: Mask and Oral ETT  Additional Equipment: Arterial line  Intra-op Plan:   Post-operative Plan: Extubation in OR  Informed Consent: I have reviewed the patients History and Physical, chart, labs and discussed the procedure including the risks, benefits and alternatives for the proposed anesthesia with the patient or authorized representative who has indicated his/her understanding and acceptance.     Dental advisory given  Plan Discussed with: CRNA  Anesthesia Plan Comments: (Lab Results      Component                Value               Date                      WBC                      6.1                 06/02/2022                HGB                      9.9 (L)              06/02/2022                HCT                      30.3 (L)            06/02/2022                MCV                      96.8                06/02/2022                PLT  187                 06/02/2022            Lab Results      Component                Value               Date                      NA                       137                 06/02/2022                K                        4.3                 06/02/2022                CO2                      23                  06/02/2022                GLUCOSE                  162 (H)             06/02/2022                BUN                      20                  06/02/2022                CREATININE               1.35 (H)            06/02/2022                CALCIUM                  8.9                 06/02/2022                EGFR                     42 (L)              05/19/2022                GFRNONAA                 40 (L)              06/02/2022           )       Anesthesia Quick Evaluation

## 2022-07-05 NOTE — Anesthesia Procedure Notes (Signed)
Procedure Name: Intubation Date/Time: 07/05/2022 3:42 PM  Performed by: Lind Guest, CRNAPre-anesthesia Checklist: Patient identified, Emergency Drugs available, Suction available, Patient being monitored and Timeout performed Patient Re-evaluated:Patient Re-evaluated prior to induction Oxygen Delivery Method: Circle system utilized Preoxygenation: Pre-oxygenation with 100% oxygen Induction Type: IV induction Ventilation: Mask ventilation without difficulty Laryngoscope Size: Mac and 3 Grade View: Grade I Tube type: Oral Tube size: 7.0 mm Number of attempts: 1 Placement Confirmation: ETT inserted through vocal cords under direct vision, positive ETCO2 and breath sounds checked- equal and bilateral Secured at: 22 cm Tube secured with: Tape Dental Injury: Teeth and Oropharynx as per pre-operative assessment

## 2022-07-05 NOTE — Progress Notes (Signed)
Orthopedic Tech Progress Note Patient Details:  Donna [name] 19-May-1946 [id number] 5lb sand bag was given to patient's SS nurse for pressure application.  Patient ID: Donna [name], female   DOB: [birth year], 75 y.o.   MRN: [id number]  Jearld Lesch 07/05/2022, 9:45 AM

## 2022-07-05 NOTE — Anesthesia Preprocedure Evaluation (Signed)
Anesthesia Evaluation  Patient identified by MRN, date of birth, ID band Patient awake    Reviewed: Allergy & Precautions, NPO status , Patient's Chart, lab work & pertinent test results  Airway Mallampati: II  TM Distance: >3 FB Neck ROM: Full    Dental no notable dental hx.    Pulmonary COPD,  COPD inhaler, former smoker   Pulmonary exam normal        Cardiovascular hypertension, Pt. on medications + Peripheral Vascular Disease   Rhythm:Regular Rate:Normal     Neuro/Psych    Depression       GI/Hepatic Neg liver ROS, hiatal hernia,GERD  ,,  Endo/Other  negative endocrine ROS    Renal/GU CRFRenal disease  negative genitourinary   Musculoskeletal  (+) Arthritis ,    Abdominal Normal abdominal exam  (+)   Peds  Hematology negative hematology ROS (+)   Anesthesia Other Findings   Reproductive/Obstetrics                             Anesthesia Physical Anesthesia Plan  ASA: 3  Anesthesia Plan: General   Post-op Pain Management:    Induction: Intravenous  PONV Risk Score and Plan: 3 and Ondansetron, Dexamethasone and Treatment may vary due to age or medical condition  Airway Management Planned: Mask and LMA  Additional Equipment: None  Intra-op Plan:   Post-operative Plan: Extubation in OR  Informed Consent: I have reviewed the patients History and Physical, chart, labs and discussed the procedure including the risks, benefits and alternatives for the proposed anesthesia with the patient or authorized representative who has indicated his/her understanding and acceptance.     Dental advisory given  Plan Discussed with: CRNA  Anesthesia Plan Comments: (Lab Results      Component                Value               Date                      WBC                      6.1                 06/02/2022                HGB                      9.9 (L)             06/02/2022                 HCT                      30.3 (L)            06/02/2022                MCV                      96.8                06/02/2022                PLT                      187  06/02/2022            Lab Results      Component                Value               Date                      NA                       137                 06/02/2022                K                        4.3                 06/02/2022                CO2                      23                  06/02/2022                GLUCOSE                  162 (H)             06/02/2022                BUN                      20                  06/02/2022                CREATININE               1.35 (H)            06/02/2022                CALCIUM                  8.9                 06/02/2022                EGFR                     42 (L)              05/19/2022                GFRNONAA                 40 (L)              06/02/2022           )       Anesthesia Quick Evaluation

## 2022-07-05 NOTE — Progress Notes (Signed)
Pt arrived to short stay from [facility]. Per Dr. Trula Slade, no consent for pt's emergent surgery. VS in SS, 99/66, pulse 90's. Istat obtained, hemoglobin 10.9. Pressure held on groin wound. Floor nurse to update husband.

## 2022-07-05 NOTE — Transfer of Care (Signed)
Immediate Anesthesia Transfer of Care Note  Patient: Donna [name]  Procedure(s) Performed: LEFT Karl Ito WITH STIMULAN ANTIBIOTIC BEAD AND KERECIS FISH SKIN GRAFT APPLICATION (Left) APPLICATION OF WOUND VAC (Left: Groin) VEIN PATCH ANGIOPLASTY (Left: Groin)  Patient Location: PACU  Anesthesia Type:General  Level of Consciousness: awake, alert , and drowsy  Airway & Oxygen Therapy: Patient Spontanous Breathing  Post-op Assessment: Report given to RN, Post -op Vital signs reviewed and stable, and Patient moving all extremities X 4  Post vital signs: Reviewed and stable  Last Vitals:  Vitals Value Taken Time  BP 128/67 07/05/22 1909  Temp    Pulse 78 07/05/22 1910  Resp    SpO2 96 % 07/05/22 1910  Vitals shown include unvalidated device data.  Last Pain:  Vitals:   07/05/22 1445  TempSrc:   PainSc: 8       Patients Stated Pain Goal: 0 (39/03/00 9233)  Complications: No notable events documented.

## 2022-07-05 NOTE — Progress Notes (Signed)
Patient arrived to [facility] from PACU. Vitals taken and stable. Patient placed on tele and CCMD notified. Wound vac in place in left groin. Suction to 125. Patient oriented to unit and staff. Call bell within reach and family at the bedside.  Donna [name]

## 2022-07-06 DIAGNOSIS — Z7902 Long term (current) use of antithrombotics/antiplatelets: Secondary | ICD-10-CM | POA: Diagnosis not present

## 2022-07-06 DIAGNOSIS — Y838 Other surgical procedures as the cause of abnormal reaction of the patient, or of later complication, without mention of misadventure at the time of the procedure: Secondary | ICD-10-CM | POA: Diagnosis present

## 2022-07-06 DIAGNOSIS — I739 Peripheral vascular disease, unspecified: Secondary | ICD-10-CM | POA: Diagnosis present

## 2022-07-06 DIAGNOSIS — J449 Chronic obstructive pulmonary disease, unspecified: Secondary | ICD-10-CM | POA: Diagnosis not present

## 2022-07-06 DIAGNOSIS — Z7982 Long term (current) use of aspirin: Secondary | ICD-10-CM | POA: Diagnosis not present

## 2022-07-06 DIAGNOSIS — I97648 Postprocedural seroma of a circulatory system organ or structure following other circulatory system procedure: Secondary | ICD-10-CM | POA: Diagnosis present

## 2022-07-06 DIAGNOSIS — Z7951 Long term (current) use of inhaled steroids: Secondary | ICD-10-CM | POA: Diagnosis not present

## 2022-07-06 DIAGNOSIS — Z85828 Personal history of other malignant neoplasm of skin: Secondary | ICD-10-CM | POA: Diagnosis not present

## 2022-07-06 DIAGNOSIS — I70212 Atherosclerosis of native arteries of extremities with intermittent claudication, left leg: Secondary | ICD-10-CM | POA: Diagnosis not present

## 2022-07-06 DIAGNOSIS — L7622 Postprocedural hemorrhage and hematoma of skin and subcutaneous tissue following other procedure: Secondary | ICD-10-CM | POA: Diagnosis not present

## 2022-07-06 DIAGNOSIS — Z9862 Peripheral vascular angioplasty status: Secondary | ICD-10-CM | POA: Diagnosis not present

## 2022-07-06 DIAGNOSIS — Z79899 Other long term (current) drug therapy: Secondary | ICD-10-CM | POA: Diagnosis not present

## 2022-07-06 DIAGNOSIS — Z48812 Encounter for surgical aftercare following surgery on the circulatory system: Secondary | ICD-10-CM | POA: Diagnosis not present

## 2022-07-06 DIAGNOSIS — Z888 Allergy status to other drugs, medicaments and biological substances status: Secondary | ICD-10-CM | POA: Diagnosis not present

## 2022-07-06 DIAGNOSIS — K219 Gastro-esophageal reflux disease without esophagitis: Secondary | ICD-10-CM | POA: Diagnosis not present

## 2022-07-06 DIAGNOSIS — D62 Acute posthemorrhagic anemia: Secondary | ICD-10-CM | POA: Diagnosis not present

## 2022-07-06 DIAGNOSIS — I10 Essential (primary) hypertension: Secondary | ICD-10-CM | POA: Diagnosis not present

## 2022-07-06 DIAGNOSIS — Z79891 Long term (current) use of opiate analgesic: Secondary | ICD-10-CM | POA: Diagnosis not present

## 2022-07-06 LAB — BASIC METABOLIC PANEL
Anion gap: 10 (ref 5–15)
BUN: 16 mg/dL (ref 8–23)
CO2: 25 mmol/L (ref 22–32)
Calcium: 8.6 mg/dL — ABNORMAL LOW (ref 8.9–10.3)
Chloride: 101 mmol/L (ref 98–111)
Creatinine, Ser: 1.09 mg/dL — ABNORMAL HIGH (ref 0.44–1.00)
GFR, Estimated: 52 mL/min — ABNORMAL LOW (ref >60)
Glucose, Bld: 190 mg/dL — ABNORMAL HIGH (ref 70–99)
Potassium: 3.2 mmol/L — ABNORMAL LOW (ref 3.5–5.1)
Sodium: 136 mmol/L (ref 135–145)

## 2022-07-06 LAB — CBC
HCT: 27.5 % — ABNORMAL LOW (ref 36.0–46.0)
Hemoglobin: 9.7 g/dL — ABNORMAL LOW (ref 12.0–15.0)
MCH: 30.3 pg (ref 26.0–34.0)
MCHC: 35.3 g/dL (ref 30.0–36.0)
MCV: 85.9 fL (ref 80.0–100.0)
Platelets: 229 10*3/uL (ref 150–400)
RBC: 3.2 MIL/uL — ABNORMAL LOW (ref 3.87–5.11)
RDW: 17.3 % — ABNORMAL HIGH (ref 11.5–15.5)
WBC: 10 10*3/uL (ref 4.0–10.5)
nRBC: 0 % (ref 0.0–0.2)

## 2022-07-06 MED ORDER — SODIUM CHLORIDE 0.9 % IV SOLN
2.0000 g | Freq: Two times a day (BID) | INTRAVENOUS | Status: DC
  Administered 2022-07-06 (×2): 2 g via INTRAVENOUS
  Filled 2022-07-06 (×2): qty 12.5

## 2022-07-06 MED ORDER — CIPROFLOXACIN IN D5W 400 MG/200ML IV SOLN
400.0000 mg | Freq: Two times a day (BID) | INTRAVENOUS | Status: DC
  Administered 2022-07-06 – 2022-07-07 (×2): 400 mg via INTRAVENOUS
  Filled 2022-07-06 (×3): qty 200

## 2022-07-06 MED ORDER — PHENYLEPHRINE HCL-NACL 20-0.9 MG/250ML-% IV SOLN
INTRAVENOUS | Status: AC
  Filled 2022-07-06: qty 500

## 2022-07-06 NOTE — Progress Notes (Signed)
Patient received from PACU via bed. AO x4.Vitals stable. Denies any pain or discomfort. Left groin incision CDI, skin glu intact. Wound vac dressing to left groin CDI. Minimal serosanguinous drainage in the canister. Bilateral peal pulses palpable. Connected to tele and CCMD notified. Call bell within reach. Husband by bedside. Plan of care continues.

## 2022-07-06 NOTE — Plan of Care (Signed)
  Problem: Education: Goal: Knowledge of General Education information will improve Description: Including pain rating scale, medication(s)/side effects and non-pharmacologic comfort measures Outcome: Progressing   Problem: Clinical Measurements: Goal: Ability to maintain clinical measurements within normal limits will improve Outcome: Progressing   

## 2022-07-06 NOTE — Progress Notes (Addendum)
Pt has urinated 100cc in [place]. Last I/o cath was last night around 1900 in PACU per report. Bladder scan this am showed 246 and 294 respectively. Patient denies any pain or discomfort. Does not have urge to urinate. Pt stated she will drink some water and try again.  Doesn't want to get up unless her husband is here and doctors have round. She is afraid, she is going to bleed gain.  Plan of care continues.

## 2022-07-06 NOTE — Progress Notes (Addendum)
Progress Note    07/06/2022 6:45 AM 1 Day Post-Op  Subjective:  says she needs her heels off the bed.  Otherwise, feeling ok.   Afebrile HR 60's-70's 75'I-433'I systolic 95% RA  Vitals:   07/06/22 0016 07/06/22 0330  BP: (!) 113/52 116/62  Pulse: 75 70  Resp: 18 18  Temp: 98.1 F (36.7 C) 98 F (36.7 C)  SpO2: 92% 96%    Physical Exam: General:  no distress Cardiac:  regular Lungs:  non labored Incisions:  left groin with wound vac with good seal Extremities:  palpable left DP pulse. Abdomen:  soft  CBC    Component Value Date/Time   WBC 10.0 07/06/2022 0200   RBC 3.20 (L) 07/06/2022 0200   HGB 9.7 (L) 07/06/2022 0200   HGB 11.6 05/19/2022 0959   HCT 27.5 (L) 07/06/2022 0200   HCT 33.8 (L) 05/19/2022 0959   PLT 229 07/06/2022 0200   PLT 309 05/19/2022 0959   MCV 85.9 07/06/2022 0200   MCV 94 05/19/2022 0959   MCH 30.3 07/06/2022 0200   MCHC 35.3 07/06/2022 0200   RDW 17.3 (H) 07/06/2022 0200   RDW 12.8 05/19/2022 0959   LYMPHSABS 1.1 10/26/2020 1515   MONOABS 1.1 (H) 10/26/2020 1515   EOSABS 0.3 10/26/2020 1515   BASOSABS 0.1 10/26/2020 1515    BMET    Component Value Date/Time   NA 136 07/06/2022 0200   NA 140 05/19/2022 0959   K 3.2 (L) 07/06/2022 0200   CL 101 07/06/2022 0200   CO2 25 07/06/2022 0200   GLUCOSE 190 (H) 07/06/2022 0200   BUN 16 07/06/2022 0200   BUN 16 05/19/2022 0959   CREATININE 1.09 (H) 07/06/2022 0200   CALCIUM 8.6 (L) 07/06/2022 0200   GFRNONAA 52 (L) 07/06/2022 0200   GFRAA >60 10/01/2015 1029    INR    Component Value Date/Time   INR 1.0 06/02/2022 1109     Intake/Output Summary (Last 24 hours) at 07/06/2022 0645 Last data filed at 07/06/2022 0500 Gross per 24 hour  Intake 3040 ml  Output 700 ml  Net 2340 ml    Special Requests LEFT FEMORAL PATCH PT ON ANCEF,VANC,GENT  Gram Stain RARE WBC PRESENT, PREDOMINANTLY MONONUCLEAR NO ORGANISMS SEEN Performed at [facility], [address],  [place], [place] [address]  Culture PENDING  Report Status PENDING   Specimen Description WOUND  Special Requests GROIN  Gram Stain NO WBC SEEN NO ORGANISMS SEEN Performed at [facility], [place] [address], [place], [place] [address]  Culture PENDING  Report Status PENDING    Assessment/Plan:  76 y.o. female is s/p:  I&D left groin seroma with Kerecis and abx bead placement with vac And Re-exploration for bleeding with removal of left femoral bovine pericardial patch and replacement with saphenous vein patch angioplasty with Kerecis and abx bead placement with vac. 1 Day Post-Op   -pt left groin with vac with good seal. -she has palpable left DP pulse. -fortunately gram stain for patch and wound are without organisms and only rare WBC on patch gram stain.  Await cultures.  -I elevated her heels off the bed. -acute blood loss anemia stable after receiving 2 units PRBC's.  -DVT prophylaxis:  SCD's.  Plavix currently on hold.  Asa continued.  -OOB today   Leontine Locket, PA-C [facility] [phone] 07/06/2022 6:45 AM  I agree with the above.  I have seen and evaluated the patient.  She is resting  comfortably this morning.  Her groin wound VAC has a good seal.  There has been no further evidence of bleeding.  I discussed with her that I would like for her to stay 1 more night.  Will follow-up her cultures, no organisms have been isolated.  Her Plavix is on hold but should be restarted before she goes home.  [name]

## 2022-07-06 NOTE — Plan of Care (Signed)

## 2022-07-07 ENCOUNTER — Encounter (HOSPITAL_COMMUNITY): Payer: Self-pay | Admitting: Surgery

## 2022-07-07 DIAGNOSIS — K219 Gastro-esophageal reflux disease without esophagitis: Secondary | ICD-10-CM | POA: Diagnosis not present

## 2022-07-07 DIAGNOSIS — I10 Essential (primary) hypertension: Secondary | ICD-10-CM | POA: Diagnosis not present

## 2022-07-07 DIAGNOSIS — Z79891 Long term (current) use of opiate analgesic: Secondary | ICD-10-CM | POA: Diagnosis not present

## 2022-07-07 DIAGNOSIS — I70212 Atherosclerosis of native arteries of extremities with intermittent claudication, left leg: Secondary | ICD-10-CM | POA: Diagnosis not present

## 2022-07-07 DIAGNOSIS — Z85828 Personal history of other malignant neoplasm of skin: Secondary | ICD-10-CM | POA: Diagnosis not present

## 2022-07-07 DIAGNOSIS — J449 Chronic obstructive pulmonary disease, unspecified: Secondary | ICD-10-CM | POA: Diagnosis not present

## 2022-07-07 DIAGNOSIS — Z7982 Long term (current) use of aspirin: Secondary | ICD-10-CM | POA: Diagnosis not present

## 2022-07-07 DIAGNOSIS — Z9862 Peripheral vascular angioplasty status: Secondary | ICD-10-CM | POA: Diagnosis not present

## 2022-07-07 DIAGNOSIS — Z48812 Encounter for surgical aftercare following surgery on the circulatory system: Secondary | ICD-10-CM | POA: Diagnosis not present

## 2022-07-07 DIAGNOSIS — Z7902 Long term (current) use of antithrombotics/antiplatelets: Secondary | ICD-10-CM | POA: Diagnosis not present

## 2022-07-07 MED ORDER — CLOPIDOGREL BISULFATE 75 MG PO TABS
75.0000 mg | ORAL_TABLET | Freq: Every day | ORAL | Status: DC
  Administered 2022-07-07: 75 mg via ORAL
  Filled 2022-07-07: qty 1

## 2022-07-07 MED ORDER — CIPROFLOXACIN HCL 500 MG PO TABS: 500.0000 mg | ORAL_TABLET | Freq: Two times a day (BID) | ORAL | 0 refills | Status: DC

## 2022-07-07 MED ORDER — CIPROFLOXACIN HCL 500 MG PO TABS
500.0000 mg | ORAL_TABLET | Freq: Two times a day (BID) | ORAL | Status: DC
  Administered 2022-07-07: 500 mg via ORAL
  Filled 2022-07-07: qty 1

## 2022-07-07 MED ORDER — HYDROCODONE-ACETAMINOPHEN 5-325 MG PO TABS: 1.0000 | ORAL_TABLET | Freq: Four times a day (QID) | ORAL | 0 refills | Status: AC | PRN

## 2022-07-07 NOTE — Progress Notes (Signed)
Pt transferred self-independently after setup to recliner. Pt now resting comfortably with legs elevated and no complaints of pain.

## 2022-07-07 NOTE — Progress Notes (Signed)
Nursing dc note  Patient alert and oriented, both patient and husband verbalized understanding of dc inttructions. All paperwork and supplies given to patient. Wound care/vac teaching done.

## 2022-07-07 NOTE — Discharge Summary (Signed)
Discharge Summary  Patient ID: SUDA FORBESS [id number] 76 y.o. September 15, 1945  Admit date: 07/05/2022  Discharge date and time: 07/07/2022 12:53 PM   Admitting Physician: Serafina Mitchell, MD   Discharge Physician: Harold Barban, MD  Admission Diagnoses: PAD (peripheral artery disease) ([place]) [I73.9] Postoperative seroma involving circulatory system after other circulatory system procedure [I97.648]  Discharge Diagnoses: PAD (peripheral artery disease) ([place]) [I73.9] Postoperative seroma involving circulatory system after other circulatory system procedure [I97.648]  Admission Condition: fair  Discharged Condition: good  Indication for Admission: This is a 76 year old female who is approximately 2 weeks removed from left femoral endarterectomy with bovine patch angioplasty. She had a persistent seroma and came in for surgical exploration this morning. There was no obvious infection although the patch was exposed at the base of the wound. There was no active bleeding and so I debrided the nonviable tissue and closed tissue over the patch and placed Kerecis and antibiotic beads as well as a wound VAC. After she had been on the floor for a while, she developed significant bleeding from her left groin which required manual pressure for hemostasis. She remained hemodynamically stable. She was returned to the operating room for emergent exploration.   Hospital Course: Mrs. Zeisler was admitted on 07/05/22 and underwent #1: Irrigation and debridement of left groin seroma including subcutaneous tissue and fascia. #2Leroy Kennedy xenograft placement of first 38 cm ([id number]) #3: Placement of wound VAC (6 x 1 x 1 cm #4: Placement of antibiotic impregnated beads by Dr. Trula Slade. Unfortunately on arrival to the floor she was noted to have wound VAC full and left groin was bleeding out from around [facility]. Pressure was held and was determined she needed to urgently return to OR emergently. She subsequently was taken to OR  and underwent #1: Exploration of recent left groin surgery #2: Removal of left femoral bovine pericardial patch and replacement with saphenous vein patch angioplasty #3: Harvest of left great saphenous vein #4: Placement of antibiotic impregnated beads #5: Placement of Kerecis xenograft, first 38 cm (Q41 5 8)  #6: Placement of wound VAC blue sponge (6 x 1 x 1 cm) #7: Redo left femoral artery exposure by Dr. Trula Slade. She tolerated procedure well and was taken to recovery room in stable condition. She later returned to floor in stable condition. Wound VAC in left groin to suction.  POD#1, patient did well overnight. Left leg remained well perfused and warm with palpable Dp pulse. Left groin incision intact with wound VAC to suction with good seal. Race WBCs on patch gram stain. Cultures pending. On IV Cipro.  Acute post operative blood loss anemia stable after receiving 2 units of PRBCs. Hemodynamically stable. Plavix held due to bleeding risk. Aspirin continued.   By POD#2, she remained stable for discharge home. Feeling better with more energy. Left groin incision well appearing with wound VAC with minimal output.  Left leg well perfused and warm. She remained hemodynamically stable. Pseudomonas grew on surgical culture. Transitioned to oral Cipro on discharge for 10 days. Prescription sent to her pharmacy. Plavix was restarted with no further evidence of bleeding. Home health arranged with [facility] to assist with 3x/ week wound VAC changes. Her initial VAC change will be in the [facility] office on 07/10/22. PDMP was reviewed and post operative pain medication was sent to her pharmacy.   Consults: None  Treatments: antibiotics: Ancef, vancomycin, Cipro, and gentamycin, analgesia: Fentanyl, Aspirin, and surgery:  #1: Irrigation and debridement of left groin seroma including subcutaneous tissue  and fascia. #2Leroy Kennedy xenograft placement of first 38 cm ([id number])  #3: Placement of wound VAC (6 x 1 x 1 cm #4: Placement  of antibiotic impregnated beads #1: Exploration of recent left groin surgery #2: Removal of left femoral bovine pericardial patch and replacement with saphenous vein patch angioplasty #3: Harvest of left great saphenous vein #4: Placement of antibiotic impregnated beads #5: Placement of Kerecis xenograft, first 38 cm (Q41 5 8) #6: Placement of wound VAC blue sponge (6 x 1 x 1 cm #7: Redo left femoral artery exposure   Disposition: Discharge disposition: 01-Home or Self Care       Patient Instructions:  Allergies as of 07/07/2022       Reactions   Thorazine [chlorpromazine] Anaphylaxis        Medication List     STOP taking these medications    cephALEXin 500 MG capsule Commonly known as: KEFLEX   HYDROcodone-acetaminophen 10-325 MG tablet Commonly known as: NORCO Replaced by: HYDROcodone-acetaminophen 5-325 MG tablet       TAKE these medications    acetaminophen 500 MG tablet Commonly known as: TYLENOL Take 1,000 mg by mouth every 6 (six) hours as needed for moderate pain or mild pain.   albuterol 108 (90 Base) MCG/ACT inhaler Commonly known as: VENTOLIN HFA Inhale 2 puffs into the lungs every 4 (four) hours as needed for wheezing or shortness of breath.   amLODipine 5 MG tablet Commonly known as: NORVASC Take 5 mg by mouth daily.   aspirin EC 81 MG tablet Take 1 tablet (81 mg total) by mouth daily. Swallow whole.   bisoprolol 5 MG tablet Commonly known as: ZEBETA TAKE 1 TABLET(5 MG) BY MOUTH DAILY   Breztri Aerosphere 160-9-4.8 MCG/ACT Aero Generic drug: Budeson-Glycopyrrol-Formoterol INHALE 2 INHALATIONS BY MOUTH  INTO THE LUNGS IN THE MORNING  AND AT BEDTIME   buPROPion 300 MG 24 hr tablet Commonly known as: WELLBUTRIN XL Take 300 mg by mouth daily. Take with the 150 to = 450 mg daily   buPROPion 150 MG 24 hr tablet Commonly known as: WELLBUTRIN XL Take 150 mg by mouth daily. Take with 300 mg to = 450 mg daily   ciprofloxacin 500 MG  tablet Commonly known as: Cipro Take 1 tablet (500 mg total) by mouth 2 (two) times daily.   clopidogrel 75 MG tablet Commonly known as: PLAVIX Take 75 mg by mouth daily.   DULoxetine 20 MG capsule Commonly known as: CYMBALTA Take 20 mg by mouth daily.   famotidine 20 MG tablet Commonly known as: PEPCID Take 20 mg by mouth daily.   HYDROcodone-acetaminophen 5-325 MG tablet Commonly known as: NORCO/VICODIN Take 1 tablet by mouth every 6 (six) hours as needed for moderate pain. Replaces: HYDROcodone-acetaminophen 10-325 MG tablet   hydroxychloroquine 200 MG tablet Commonly known as: PLAQUENIL Take 200 mg by mouth daily.   leflunomide 20 MG tablet Commonly known as: ARAVA Take 20 mg by mouth daily.   losartan 25 MG tablet Commonly known as: COZAAR Take 1 tablet (25 mg total) by mouth daily.   Multivitamin Women Tabs Take 1 tablet by mouth daily.   rosuvastatin 5 MG tablet Commonly known as: CRESTOR Take 5 mg by mouth daily.   triamterene-hydrochlorothiazide 37.5-25 MG tablet Commonly known as: MAXZIDE-25 Take 0.5 tablets by mouth daily.               Durable Medical Equipment  (From admission, onward)           Start  Ordered   07/05/22 2036  For home use only DME Negative pressure wound device  Once       Comments: No vac changes until after she is seen in the office next Monday.  Plan for discharge home on 12/7.  Blue sponge in place.  Question Answer Comment  Frequency of dressing change Other see comments   Length of need Other see comments   Dressing type Foam   Amount of suction 75 mm/Hg   Pressure application Continuous pressure   Supplies 10 canisters and 15 dressings per month for duration of therapy      07/05/22 2035   07/05/22 2036  For home use only DME Negative pressure wound device  Once       Question Answer Comment  Frequency of dressing change Other see comments   Length of need Other see comments   Dressing type Foam    Amount of suction 75 mm/Hg   Pressure application Continuous pressure   Supplies 10 canisters and 15 dressings per month for duration of therapy      07/05/22 2035              Discharge Care Instructions  (From admission, onward)           Start     Ordered   07/07/22 0000  Discharge wound care:       Comments: VAC will remain in place on discharge. You will have first VAC change at the vascular office visit on 07/10/22. You will then have 3x/week VAC changes with HH. You can wash around the [facility] with mild soap and water, pat dry. Do not soak in bathtub   07/07/22 1114           Activity: activity as tolerated, no driving while on analgesics, and no heavy lifting for 6 weeks Diet: regular diet and low fat, low cholesterol diet Wound Care:  wound VAC will be changed for the first time on 07/10/22 in the vascular surgery office. Home Health has been arranged and will resume changes starting on 07/12/22 and will continue 3x/week VAC changes. You may otherwise clean around [facility] with mild soap and water, pat dry.  Follow-up with [facility] on 07/10/22   Signed: Karoline Caldwell, PA-C 07/07/2022 2:22 PM [facility] Office: [phone]

## 2022-07-07 NOTE — Progress Notes (Addendum)
  Progress Note    07/07/2022 7:45 AM 2 Days Post-Op  Subjective:  says she is feeling better today, more energy. Sitting up in chair   Vitals:   07/06/22 2320 07/07/22 0440  BP: (!) 120/58 114/65  Pulse: 76 73  Resp: 20 17  Temp: 98.2 F (36.8 C) 97.7 F (36.5 C)  SpO2: 96% 96%   Physical Exam: Cardiac:  regular Lungs:  non labored. Incisions:  Left groin incision well appearing, left groin with VAC to suction. Minimal output Extremities:  well perfused and warm. Palpable Dp Abdomen:  soft, non distended Neurologic: alert and oriented  CBC    Component Value Date/Time   WBC 10.0 07/06/2022 0200   RBC 3.20 (L) 07/06/2022 0200   HGB 9.7 (L) 07/06/2022 0200   HGB 11.6 05/19/2022 0959   HCT 27.5 (L) 07/06/2022 0200   HCT 33.8 (L) 05/19/2022 0959   PLT 229 07/06/2022 0200   PLT 309 05/19/2022 0959   MCV 85.9 07/06/2022 0200   MCV 94 05/19/2022 0959   MCH 30.3 07/06/2022 0200   MCHC 35.3 07/06/2022 0200   RDW 17.3 (H) 07/06/2022 0200   RDW 12.8 05/19/2022 0959   LYMPHSABS 1.1 10/26/2020 1515   MONOABS 1.1 (H) 10/26/2020 1515   EOSABS 0.3 10/26/2020 1515   BASOSABS 0.1 10/26/2020 1515    BMET    Component Value Date/Time   NA 136 07/06/2022 0200   NA 140 05/19/2022 0959   K 3.2 (L) 07/06/2022 0200   CL 101 07/06/2022 0200   CO2 25 07/06/2022 0200   GLUCOSE 190 (H) 07/06/2022 0200   BUN 16 07/06/2022 0200   BUN 16 05/19/2022 0959   CREATININE 1.09 (H) 07/06/2022 0200   CALCIUM 8.6 (L) 07/06/2022 0200   GFRNONAA 52 (L) 07/06/2022 0200   GFRAA >60 10/01/2015 1029    INR    Component Value Date/Time   INR 1.0 06/02/2022 1109     Intake/Output Summary (Last 24 hours) at 07/07/2022 0745 Last data filed at 07/07/2022 0440 Gross per 24 hour  Intake 960 ml  Output 1660 ml  Net -700 ml     Assessment/Plan:  76 y.o. female is s/p I&D left groin seroma with Kerecis and abx bead placement with vac And  Re-exploration for bleeding with removal of left  femoral bovine pericardial patch and replacement with saphenous vein patch angioplasty with Kerecis and abx bead placement with vac. 2 Days Post-Op   Left groin incision is well appearing, wound VAC with good seal LLE well perfused and warm with palpable DP Pseudomonas on surgical culture. Has been on IV Cipro Will send home on Cipro for 10 days No growth to date on Gram stain Will restart Plavix today Stable for discharge home today Has all home health orders placed with [facility]. She has office visit on Monday 12/11 for first VAC change and then [facility] will resume weekly changes on Wednesday 12/13   Karoline Caldwell, [place] [facility] [phone] 07/07/2022  VASCULAR STAFF ADDENDUM: I have independently interviewed and examined the patient. I agree with the above.  Pt looks good this morning. VAC to suction.  Pain controlled.  Abx for pseudomonas, none growing on resected patch.  Home today with oral abx. VAC change in office Monday  Cassandria Santee, MD [facility] Phone Number: [phone] 07/07/2022 1:10 PM

## 2022-07-07 NOTE — TOC Transition Note (Addendum)
Transition of Care (TOC) - CM/SW Discharge Note Marvetta Gibbons RN, BSN Transitions of Care Unit 4E- RN Case Manager See Treatment Team for direct phone #   Patient Details  Name: Donna [name] MRN: [id number] Date of Birth: [birth year]  Transition of Care [facility]) CM/SW Contact:  Dawayne Patricia, RN Phone Number: 07/07/2022, 12:41 PM   Clinical Narrative:    Pt stable for transition home today, pending home wound VAC.  CM has spoken with [facility] liaison and home VAC has been approved and pending release for delivery to room. Once released POD to be emailed to [facility] to pick up and deliver home VAC to bedside.   CM spoke with pt at bedside to explain home VAC is pending release and should be available by noon. Confirmed HH services with [facility] which pt was active with prior to admit for RN/PT needs.  Per Vascular - pt will do first VAC drsg change in office and then [facility] to start drsg changes in the home on Wed 12/13.  CM has spoken with Lattie Haw the liaison for [place] regarding resumption of services and plan for [facility] drsg.   1200- [facility] has released home VAC and CM has delivered to the bedside- signed POD has been faxed back to [facility] and copies of POD placed on shadow chart- pt provided original POD along with supplies. Bedside RN to change over to home VAC. Spouse at the bedside to transport home.   No further TOC needs noted.    Final next level of care: [place] Barriers to Discharge: No Barriers Identified   Patient Goals and CMS Choice Patient states their goals for this hospitalization and ongoing recovery are:: return home CMS Medicare.gov Compare Post Acute Care list provided to:: Patient Choice offered to / list presented to : Patient  Discharge Placement                 Home w/ [facility]      Discharge Plan and Services   Discharge Planning Services: CM Consult Post Acute Care Choice: Home Health, Resumption of Svcs/PTA Provider          DME Arranged: Vac DME  Agency: [facility] Date DME Agency Contacted: 07/06/22 Time DME Agency Contacted: [id number] Representative spoke with at DME Agency: Olivia Mackie HH Arranged: RN, PT [facility] Agency: [place] Date [place]: 07/07/22 Time [place]: [phone] Representative spoke with at [place]: Lattie Haw  Social Determinants of Health (SDOH) Interventions Food Insecurity Interventions: Intervention Not Indicated Housing Interventions: Intervention Not Indicated Transportation Interventions: Intervention Not Indicated Utilities Interventions: Intervention Not Indicated   Readmission Risk Interventions    07/07/2022   12:41 PM  Readmission Risk Prevention Plan  Transportation Screening Complete  PCP or Specialist Appt within 5-7 Days Complete  Home Care Screening Complete  Medication Review (RN CM) Complete

## 2022-07-09 LAB — TYPE AND SCREEN
ABO/RH(D): O POS
Antibody Screen: NEGATIVE
Unit division: 0
Unit division: 0
Unit division: 0
Unit division: 0

## 2022-07-09 LAB — BPAM RBC
Blood Product Expiration Date: 202401022359
Blood Product Expiration Date: 202401022359
Blood Product Expiration Date: 202401022359
Blood Product Expiration Date: 202401022359
ISSUE DATE / TIME: 202312061544
ISSUE DATE / TIME: 202312061544
Unit Type and Rh: 5100
Unit Type and Rh: 5100
Unit Type and Rh: 5100
Unit Type and Rh: 5100

## 2022-07-10 ENCOUNTER — Ambulatory Visit (INDEPENDENT_AMBULATORY_CARE_PROVIDER_SITE_OTHER): Payer: Medicare Other | Admitting: Physician Assistant

## 2022-07-10 VITALS — BP 149/81 | HR 69 | Temp 97.8°F | Resp 16

## 2022-07-10 DIAGNOSIS — L7634 Postprocedural seroma of skin and subcutaneous tissue following other procedure: Secondary | ICD-10-CM

## 2022-07-10 LAB — AEROBIC/ANAEROBIC CULTURE W GRAM STAIN (SURGICAL/DEEP WOUND): Gram Stain: NONE SEEN

## 2022-07-10 MED ORDER — CIPROFLOXACIN HCL 500 MG PO TABS: 500.0000 mg | ORAL_TABLET | Freq: Two times a day (BID) | ORAL | 0 refills | Status: AC

## 2022-07-10 NOTE — Progress Notes (Signed)
POST OPERATIVE OFFICE NOTE    CC:  F/u for surgery  HPI:  Donna [name] is a 76 y.o. female who is s/p irrigation and debridement of left groin seroma with placement of Kerecis graft, antibiotic beads, and wound VAC on 07/05/2022 by Dr. Trula Slade.  She subsequently returned to the OR later with active bleeding from the groin, requiring left femoral artery bovine patch replacement with saphenous vein and replacement of Kerecis and wound VAC.  Surgical cultures grew Pseudomonas and she was sent home with a 10-day course of Cipro and home wound VAC.  She first underwent left femoral endarterectomy with bovine patch placement on 06/02/2022 by Dr. Trula Slade.  Pt returns today for follow up.  Pt states she has been doing well since surgery.  Occasionally the wound VAC would be over the weekend and stopped working for a couple of minutes.  She denies any fever or chills. She has been taking her ciprofloxacin without issue.   Allergies  Allergen Reactions   Thorazine [Chlorpromazine] Anaphylaxis    Current Outpatient Medications  Medication Sig Dispense Refill   acetaminophen (TYLENOL) 500 MG tablet Take 1,000 mg by mouth every 6 (six) hours as needed for moderate pain or mild pain.     albuterol (PROVENTIL HFA;VENTOLIN HFA) 108 (90 Base) MCG/ACT inhaler Inhale 2 puffs into the lungs every 4 (four) hours as needed for wheezing or shortness of breath.     amLODipine (NORVASC) 5 MG tablet Take 5 mg by mouth daily.     aspirin EC 81 MG tablet Take 1 tablet (81 mg total) by mouth daily. Swallow whole. 30 tablet 12   bisoprolol (ZEBETA) 5 MG tablet TAKE 1 TABLET(5 MG) BY MOUTH DAILY 30 tablet 5   BREZTRI AEROSPHERE 160-9-4.8 MCG/ACT AERO INHALE 2 INHALATIONS BY MOUTH  INTO THE LUNGS IN THE MORNING  AND AT BEDTIME 32.1 g 3   buPROPion (WELLBUTRIN XL) 150 MG 24 hr tablet Take 150 mg by mouth daily. Take with 300 mg to = 450 mg daily     buPROPion (WELLBUTRIN XL) 300 MG 24 hr tablet Take 300 mg by mouth daily.  Take with the 150 to = 450 mg daily     ciprofloxacin (CIPRO) 500 MG tablet Take 1 tablet (500 mg total) by mouth 2 (two) times daily. 20 tablet 0   clopidogrel (PLAVIX) 75 MG tablet Take 75 mg by mouth daily.     DULoxetine (CYMBALTA) 20 MG capsule Take 20 mg by mouth daily.     famotidine (PEPCID) 20 MG tablet Take 20 mg by mouth daily.     HYDROcodone-acetaminophen (NORCO/VICODIN) 5-325 MG tablet Take 1 tablet by mouth every 6 (six) hours as needed for moderate pain. 12 tablet 0   hydroxychloroquine (PLAQUENIL) 200 MG tablet Take 200 mg by mouth daily.     leflunomide (ARAVA) 20 MG tablet Take 20 mg by mouth daily.     losartan (COZAAR) 25 MG tablet Take 1 tablet (25 mg total) by mouth daily. 90 tablet 3   Multiple Vitamins-Minerals (MULTIVITAMIN WOMEN) TABS Take 1 tablet by mouth daily.     rosuvastatin (CRESTOR) 5 MG tablet Take 5 mg by mouth daily.     triamterene-hydrochlorothiazide (MAXZIDE-25) 37.5-25 MG tablet Take 0.5 tablets by mouth daily.     No current facility-administered medications for this visit.     ROS:  See HPI  Physical Exam:  Incision: Left groin wound with healthy granulation tissue and minimal bleeding. No signs of infection or hematoma.  Wound vac changed Extremities:  LLE warm and well perfused Neuro: intact motor and sensation of LLE    Assessment/Plan:  This is a 76 y.o. female who is s/p:left groin washout with wound vac placement x2 on 07/05/2022   -Left groin wound healing well with no signs of infection. Wound also evaluated by Dr.Brabham -LLE warm and well perfused -L groin wound vac changed in office today. HH will start wound vac changes starting 07/12/2022 -Will extend cipro course to full 6 weeks  -She will follow up with [place] in 2 weeks for wound check   Vicente Serene, PA-C [facility] [phone]   Clinic MD:  Trula Slade

## 2022-07-13 ENCOUNTER — Other Ambulatory Visit: Payer: Self-pay | Admitting: Internal Medicine

## 2022-07-13 DIAGNOSIS — I10 Essential (primary) hypertension: Secondary | ICD-10-CM | POA: Diagnosis not present

## 2022-07-13 DIAGNOSIS — Z48812 Encounter for surgical aftercare following surgery on the circulatory system: Secondary | ICD-10-CM | POA: Diagnosis not present

## 2022-07-13 DIAGNOSIS — K219 Gastro-esophageal reflux disease without esophagitis: Secondary | ICD-10-CM | POA: Diagnosis not present

## 2022-07-13 DIAGNOSIS — I70212 Atherosclerosis of native arteries of extremities with intermittent claudication, left leg: Secondary | ICD-10-CM | POA: Diagnosis not present

## 2022-07-13 DIAGNOSIS — J449 Chronic obstructive pulmonary disease, unspecified: Secondary | ICD-10-CM | POA: Diagnosis not present

## 2022-07-13 DIAGNOSIS — Z9862 Peripheral vascular angioplasty status: Secondary | ICD-10-CM | POA: Diagnosis not present

## 2022-07-17 ENCOUNTER — Ambulatory Visit: Payer: Medicare Other | Admitting: Cardiology

## 2022-07-17 ENCOUNTER — Encounter: Payer: Self-pay | Admitting: Cardiology

## 2022-07-17 VITALS — BP 137/71 | HR 77 | Resp 16 | Ht 66.0 in | Wt 152.0 lb

## 2022-07-17 DIAGNOSIS — I739 Peripheral vascular disease, unspecified: Secondary | ICD-10-CM

## 2022-07-17 DIAGNOSIS — Z9889 Other specified postprocedural states: Secondary | ICD-10-CM

## 2022-07-17 DIAGNOSIS — I10 Essential (primary) hypertension: Secondary | ICD-10-CM

## 2022-07-17 DIAGNOSIS — I70212 Atherosclerosis of native arteries of extremities with intermittent claudication, left leg: Secondary | ICD-10-CM | POA: Diagnosis not present

## 2022-07-17 DIAGNOSIS — K219 Gastro-esophageal reflux disease without esophagitis: Secondary | ICD-10-CM | POA: Diagnosis not present

## 2022-07-17 DIAGNOSIS — J449 Chronic obstructive pulmonary disease, unspecified: Secondary | ICD-10-CM | POA: Diagnosis not present

## 2022-07-17 DIAGNOSIS — Z48812 Encounter for surgical aftercare following surgery on the circulatory system: Secondary | ICD-10-CM | POA: Diagnosis not present

## 2022-07-17 DIAGNOSIS — I70213 Atherosclerosis of native arteries of extremities with intermittent claudication, bilateral legs: Secondary | ICD-10-CM | POA: Diagnosis not present

## 2022-07-17 DIAGNOSIS — Z9862 Peripheral vascular angioplasty status: Secondary | ICD-10-CM | POA: Diagnosis not present

## 2022-07-17 NOTE — Progress Notes (Signed)
Primary Physician/Referring:  Deland Pretty, MD  Patient ID: Donna [name], female    DOB: October 24, 1945, 76 y.o.   MRN: [id number]  Chief Complaint  Patient presents with   PAD   Hypertension   Hyperlipidemia   Follow-up    6 week   HPI:    Donna [name]  is a 76 y.o. female patient with hypertension, hyperlipidemia, degenerative joint disease and chronic back pain,  hyperglycemia, stage IIIa chronic kidney disease, prior tobacco use disorder with approximately 20-pack-year history of smoking quit in 2000.   She presents here for follow-up of peripheral artery disease.  Due to severe lifestyle-limiting claudication, underwent peripheral arteriogram on 05/30/2022 and found to have complex and calcified high-grade/occlusion of the left CFA, underwent left femoral artery endarterectomy by Dr. Trula Slade on 06/02/2022  but developed seroma and infection and hematoma leading to replacement with saphenous vein patch and also placed on wound VAC and antibiotics due to Pseudomonas infection. She now presents for follow-up.  She has noticed improvement in strength of her legs, is able to stand up for several minutes without having to sit down.  She has not had any fever or chills or discharge from the surgical site.  Her husband is present.     Past Medical History:  Diagnosis Date   Arthritis    lumbar, toes, L knee, cerv. spine & hands  - rheumatoid   Cancer (HCC)    basal cell removed fr. 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 Leanora Ivanoff, but prev. saw Dr. Einar Gip   Peripheral vascular disease [facility])    Seasonal  allergies    Past Surgical History:  Procedure Laterality Date   ABDOMINAL AORTOGRAM W/LOWER EXTREMITY N/A 05/30/2022   Procedure: ABDOMINAL AORTOGRAM W/LOWER EXTREMITY;  Surgeon: Adrian Prows, MD;  Location: [place] [facility];  Service: Cardiovascular;  Laterality: N/A;   APPLICATION OF WOUND VAC Left 07/05/2022   Procedure: APPLICATION OF WOUND VAC;  Surgeon: Serafina Mitchell, MD;  Location: [place];  Service: Vascular;  Laterality: Left;   APPLICATION OF WOUND VAC Left 07/05/2022   Procedure: APPLICATION OF WOUND VAC;  Surgeon: Serafina Mitchell, MD;  Location: [place];  Service: Vascular;  Laterality: Left;   BACK SURGERY  07/31/1992   cerv. spine fusion    BREAST ENHANCEMENT SURGERY     BREAST SURGERY  04/01/1979   implants- bilateral    CERVICAL SPINE SURGERY  07/31/1990   fusion   ENDARTERECTOMY FEMORAL Left 06/02/2022   Procedure: LEFT ENDARTERECTOMY FEMORAL with BOVINE PATCH;  Surgeon: Serafina Mitchell, MD;  [facility];  Service: Vascular;  Laterality: Left;   GROIN DEBRIDEMENT Left 07/05/2022   Procedure: LEFT Karl Ito WITH STIMULAN ANTIBIOTIC BEAD AND KERECIS FISH SKIN GRAFT APPLICATION;  Surgeon: Serafina Mitchell, MD;  Location: [place];  Service: Vascular;  Laterality: Left;   INCISION AND DRAINAGE Left 07/05/2022   Procedure: LEFT Karl Ito;  Surgeon: Serafina Mitchell, MD;  Location: [place];  Service: Vascular;  Laterality: Left;   JOINT REPLACEMENT Right 07/31/2005   LUMBAR LAMINECTOMY/DECOMPRESSION MICRODISCECTOMY Left 10/07/2015   Procedure: Lumbar three-five Decompressive lumbar Laminectomy & Left Lumbar four-five Microdiscectomy;  Surgeon: Jovita Gamma, MD;  Location: [place] [facility];  Service: Neurosurgery;  Laterality: Left;   LUMBAR SPINE SURGERY  2016   PATCH ANGIOPLASTY Left 07/05/2022   Procedure: VEIN PATCH ANGIOPLASTY;  Surgeon: Serafina Mitchell, MD;  Location: [facility];  Service: Vascular;  Laterality: Left;   TONSILLECTOMY     TOTAL HIP ARTHROPLASTY  07/31/2005    Right   TUBAL LIGATION     Family History  Problem Relation Age of Onset   Kidney cancer Mother    Lung cancer Sister    Heart disease Maternal Aunt    Heart disease Maternal Aunt    Kidney cancer Maternal Aunt    Kidney cancer Maternal Uncle     Social History   Tobacco Use   Smoking status: Former    Packs/day: 0.70    Years: 24.00    Total pack years: 16.80    Types: Cigarettes    Quit date: 07/31/2009    Years since quitting: 12.9   Smokeless tobacco: Former    Quit date: 10/01/1998  Substance Use Topics   Alcohol use: Yes    Comment: wine- rarely   Marital Status: Married  ROS  Review of Systems  Cardiovascular:  Positive for claudication, cyanosis and leg swelling. Negative for chest pain, dyspnea on exertion, paroxysmal nocturnal dyspnea and syncope.   Objective   Review of Systems  Cardiovascular:  Positive for claudication. Negative for chest pain, dyspnea on exertion and leg swelling.     Blood pressure 137/71, pulse 77, resp. rate 16, height _0  (1.676 m), weight 152 lb (68.9 kg), SpO2 96 %. Body mass index is 24.53 kg/m.     07/17/2022    9:08 AM 07/10/2022   10:34 AM 07/07/2022   11:47 AM  Vitals with BMI  Height _1     Weight 152 lbs    BMI 00.45    Systolic 997 [id number] 423  Diastolic 71 81 56  Pulse 77 69 84    Physical Exam Neck:     Vascular: Carotid bruit (bilateral) present. No JVD.  Cardiovascular:     Rate and Rhythm: Normal rate and regular rhythm.     Pulses:          Radial pulses are 2+ on the right side and 2+ on the left side.       Femoral pulses are 1+ on the right side and 1+ on the left side.      Popliteal pulses are 0 on the right side and 0 on the left side.       Dorsalis pedis pulses are 0 on the right side and 0 on the left side.       Posterior tibial pulses are 0 on the right side and 0 on the left side.     Heart sounds: Normal heart sounds. No murmur heard.    No gallop.     Comments:  Left groin site is healed  well.   Pulmonary:     Effort: Pulmonary effort is normal.     Breath sounds: Normal breath sounds. No wheezing or rales.  Abdominal:     General: Bowel sounds are normal.     Palpations: Abdomen is soft.  Musculoskeletal:     Right lower leg: No edema.  Left lower leg: No edema.  Feet:     Right foot:     Skin integrity: Skin breakdown and warmth present.     Left foot:     Skin integrity: Skin breakdown, erythema and warmth present.     Comments: Right fifth toe erythematous wound with purulent drainage. Discoloration noted to bilateral toes.   Medications and allergies   Allergies  Allergen Reactions   Thorazine [Chlorpromazine] Anaphylaxis     Medication list after today's encounter   Current Outpatient Medications:    acetaminophen (TYLENOL) 500 MG tablet, Take 1,000 mg by mouth every 6 (six) hours as needed for moderate pain or mild pain., Disp: , Rfl:    albuterol (PROVENTIL HFA;VENTOLIN HFA) 108 (90 Base) MCG/ACT inhaler, Inhale 2 puffs into the lungs every 4 (four) hours as needed for wheezing or shortness of breath., Disp: , Rfl:    amLODipine (NORVASC) 5 MG tablet, Take 5 mg by mouth daily., Disp: , Rfl:    aspirin EC 81 MG tablet, Take 1 tablet (81 mg total) by mouth daily. Swallow whole., Disp: 30 tablet, Rfl: 12   bisoprolol (ZEBETA) 5 MG tablet, TAKE 1 TABLET BY MOUTH DAILY, Disp: 90 tablet, Rfl: 1   BREZTRI AEROSPHERE 160-9-4.8 MCG/ACT AERO, INHALE 2 INHALATIONS BY MOUTH  INTO THE LUNGS IN THE MORNING  AND AT BEDTIME, Disp: 32.1 g, Rfl: 3   buPROPion (WELLBUTRIN XL) 150 MG 24 hr tablet, Take 150 mg by mouth daily. Take with 300 mg to = 450 mg daily, Disp: , Rfl:    buPROPion (WELLBUTRIN XL) 300 MG 24 hr tablet, Take 300 mg by mouth daily. Take with the 150 to = 450 mg daily, Disp: , Rfl:    ciprofloxacin (CIPRO) 500 MG tablet, Take 1 tablet (500 mg total) by mouth 2 (two) times daily., Disp: 64 tablet, Rfl: 0   clopidogrel (PLAVIX) 75 MG tablet, Take 75 mg by  mouth daily., Disp: , Rfl:    DULoxetine (CYMBALTA) 20 MG capsule, Take 20 mg by mouth daily., Disp: , Rfl:    famotidine (PEPCID) 20 MG tablet, Take 20 mg by mouth daily., Disp: , Rfl:    HYDROcodone-acetaminophen (NORCO/VICODIN) 5-325 MG tablet, Take 1 tablet by mouth every 6 (six) hours as needed for moderate pain., Disp: 12 tablet, Rfl: 0   hydroxychloroquine (PLAQUENIL) 200 MG tablet, Take 200 mg by mouth daily., Disp: , Rfl:    leflunomide (ARAVA) 20 MG tablet, Take 20 mg by mouth daily., Disp: , Rfl:    Multiple Vitamins-Minerals (MULTIVITAMIN WOMEN) TABS, Take 1 tablet by mouth daily., Disp: , Rfl:    rosuvastatin (CRESTOR) 5 MG tablet, Take 5 mg by mouth daily., Disp: , Rfl:    triamterene-hydrochlorothiazide (MAXZIDE-25) 37.5-25 MG tablet, Take 0.5 tablets by mouth daily., Disp: , Rfl:   Laboratory examination:   Lab Results  Component Value Date   NA 136 07/06/2022   K 3.2 (L) 07/06/2022   CO2 25 07/06/2022   GLUCOSE 190 (H) 07/06/2022   BUN 16 07/06/2022   CREATININE 1.09 (H) 07/06/2022   CALCIUM 8.6 (L) 07/06/2022   EGFR 42 (L) 05/19/2022   GFRNONAA 52 (L) 07/06/2022       Latest Ref Rng & Units 07/06/2022    2:00 AM 07/05/2022    5:10 PM 07/05/2022    4:41 PM  CMP  Glucose 70 - 99 mg/dL 190     BUN 8 - 23 mg/dL 16     Creatinine 0.44 -  1.00 mg/dL 1.09     Sodium 135 - 145 mmol/L 136  134  135   Potassium 3.5 - 5.1 mmol/L 3.2  3.8  3.7   Chloride 98 - 111 mmol/L 101     CO2 22 - 32 mmol/L 25     Calcium 8.9 - 10.3 mg/dL 8.6         Latest Ref Rng & Units 07/06/2022    2:00 AM 07/05/2022    5:10 PM 07/05/2022    4:41 PM  CBC  WBC 4.0 - 10.5 K/uL 10.0     Hemoglobin 12.0 - 15.0 g/dL 9.7  9.9  8.2   Hematocrit 36.0 - 46.0 % 27.5  29.0  24.0   Platelets 150 - 400 K/uL 229       Lipid Panel Recent Labs    06/02/22 2359  CHOL 125  TRIG 64  LDLCALC 48  VLDL 13  HDL 64  CHOLHDL 2.0    HEMOGLOBIN A1C No results found for: "HGBA1C", "MPG" TSH No results  for input(s): "TSH" in the last 8760 hours.  External labs:   02/15/2022: Sodium 140, potassium 4.0, glucose 104, BUN 21, creatinine 1.24 EGFR 42 Hemoglobin 13.7, hematocrit 40.5, platelet count 244  08/30/2021: Hemoglobin A1c 6.0%  Radiology:   High-resolution CT scan of the chest 03/02/2022: 1. Cardiovascular: Normal heart size. No pericardial effusion. Mitral annular calcifications. Severe three-vessel coronary artery calcifications. Normal caliber thoracic aorta with severe calcified plaque.  2. Aortic Atherosclerosis and Emphysema  3. Part solid nodule of the right upper lobe measuring 18 mm in overall diameter with 4 mm solid component. Follow-up non-contrast CT recommended at 3-6 months to confirm persistence.  Additional bilateral solid pulmonary nodules are seen, largest measures 4 mm. Recommend attention on follow-up.  Cardiac Studies:   PCV MYOCARDIAL PERFUSION WITH LEXISCAN 05/22/2022  Lexiscan nuclear stress test performed using 1-day protocol. SPECT images show decreased tracer uptake in inferior myocardium, likely due to breast attenuation with imaging performed in the sitting position. Stress LVEF 65%.   PCV ECHOCARDIOGRAM COMPLETE 05/25/2022  Study Quality: Technically difficult study. Normal LV systolic function with visual EF 60-65%. Left ventricle cavity is normal in size. Normal left ventricular wall thickness. Normal global wall motion. Normal diastolic filling pattern, normal LAP. Trace tricuspid regurgitation. No evidence of pulmonary hypertension. Small pericardial effusion. There is no hemodynamic significance. Compared to 10/17/2017: G2DD & elevated LAP are now normal, otherwise no significant change.  Carotid artery duplex 05/25/2022: Duplex suggests stenosis in the right internal carotid artery (1-15%). No evidence of significant stenosis in the right external carotid artery. Duplex suggests stenosis in the left internal carotid artery (1-15%). No evidence  of significant stenosis in the left external carotid artery. Antegrade right vertebral artery flow. Antegrade left vertebral artery flow. Mild heterogeneous plaque noted bilaterally. Compared to the study done on 03/01/2020, left ICA stenosis of 50 to 49% not present.  Lower Extremity Arterial Duplex 05/25/2022: Diffuse calcific plaque throughout the bilateral lower extremity arteries.  Right CFA & PFA stenosis >50. Right distal SFA >50% stenosis.  Left CFA with severe dampened monophasic waveform suggests significant proximal vessel disease (Iliac artery).  Left CFA and Proximal SFA stenosis of >50%.  This exam reveals mildly decreased perfusion of the right lower extremity, noted at the post tibial artery level (ABI 0.81), severe dampened monophasic waveform pattern at the ankle.   This exam reveals moderately decreased perfusion of the left lower extremity, noted at the dorsalis pedis artery level (  ABI 0.79), severe dampened monophasic waveform pattern at the ankle.  Bilateral baker's cyst noted.   Peripheral arteriogram 05/30/2022: Abdominal aortogram revealed presence of 2 renal arteries on either side, left renal artery has mild diffuse disease.  Right renal artery is widely patent.  The abdominal aorta is mildly tortuous, distal abdominal aorta has mild ectasia.  Aortoiliac bifurcation is widely patent.  Bilateral CIA and EIA are widely patent with mild disease. Left lower extremity: Left CFA has calcific 90% stenosis.  There is mild to moderate skipped disease in the left mid and distal SFA.  No high-grade stenosis.  Lesions are about 50 to 60%.  Below the left knee, there is two-vessel runoff in the form of peroneal artery and posterior tibial artery.  AT is occluded. Right lower extremity: Right CFA is widely patent.  Mild disease is evident.  There are multiple tandem high-grade 80 to 90% focal stenosis in the right proximal, mid SFA.  Below the right knee there is only a single vessel runoff  in the form of peroneal artery and this reconstitutes the AT at the level of the ankle.   Recommendation: Patient will need surgical left CFA endarterectomy.  Depending upon her symptoms, she may need right SFA atherectomy/balloon angioplasty/stenting at a later date.    Left Femoral endarterectomy 06/02/2022: Extensive plaque from the inguinal ligament down in to the SFA and profunda.  The patch extends onto the SFA for 1.5 cm.   Eversion endarterectomy of the profunda   ABI 06/16/2022: This exam reveals moderately decreased perfusion of the right lower extremity, noted at the dorsalis pedis and post tibial artery level (ABI 0.64) with markedly dampened monophasic waveform at the right ankle. This exam reveals mildly decreased perfusion of the left lower extremity, noted at the dorsalis pedis artery level (ABI 0.94) with mildly abnormal biphasic waveform pattern at the left ankle.  Compared to prior study 05/25/2022, further reduction in right ABI.  Left ABI has improved from 0.79, severely dampened monophasic waveform is now replaced by mildly abnormal biphasic waveforms and suggest successful left femoral endarterectomy and successful revascularization.  EKG:    Assessment     ICD-10-CM   1. PAD (peripheral artery disease) (HCC)  I73.9     2. Primary hypertension  I10     3. H/O left femoral artery endarterectomy 06/02/2022  Z98.890        No orders of the defined types were placed in this encounter.   No orders of the defined types were placed in this encounter.   Medications Discontinued During This Encounter  Medication Reason   losartan (COZAAR) 25 MG tablet Patient Preference   ciprofloxacin (CIPRO) 641 MG tablet Duplicate      Recommendations:   Donna [name] is a 76 y.o. female patient with hypertension, hyperlipidemia, degenerative joint disease and chronic back pain,  hyperglycemia, stage IIIa chronic kidney disease, prior tobacco use disorder with approximately  20-pack-year history of smoking quit in 2000.   She presents here for follow-up of peripheral artery disease, peripheral arteriogram on 05/30/2022 and found to have complex and calcified high-grade/occlusion of the left CFA, underwent left femoral artery endarterectomy by Dr. Trula Slade on 06/02/2022  but developed seroma and infection and hematoma leading to replacement with saphenous vein patch and also placed on wound VAC and antibiotics due to Pseudomonas infection.  She now presents for follow-up.  1. PAD (peripheral artery disease) ([place]) Patient has recuperated very well and symptoms of claudication have essentially resolved with  regard to the left lower extremity, advised her with regard to right lower extremity claudication, we could certainly wait as this not limb threatening.  Her right lower extremity perfusion is also improved.  Previously she used to have much colder right lower extremity but both feet are warm now.  Continue walking and exercise will certainly help with walking distance improvement.  Patient is in agreement with this plan, I like to see her back in 6 months for follow-up.  For now she will continue with aspirin and Plavix.  I reviewed her ABI there is being improvement in left ABI.  2. Primary hypertension Blood pressure under  control, she is on ARB, continue the same for now.  3. H/O left femoral artery endarterectomy 06/02/2022 Left femoral endarterectomy although had complicated course now has healed well.  She still has vacuum in place but is draining very minimal.  There is no mass, no pulsatile mass, no discharge.  Suspect the baclofen taken off fairly soon.  Husband present and all questions answered.    Adrian Prows, MD, [facility] 07/17/2022, 9:34 AM Office: [phone] Fax: [phone] Pager: [phone]

## 2022-07-18 ENCOUNTER — Ambulatory Visit: Payer: Medicare Other | Admitting: Cardiology

## 2022-07-20 NOTE — Progress Notes (Signed)
POST OPERATIVE OFFICE NOTE    CC:  F/u for surgery  HPI:  This is a 76 y.o. female who is s/p left femoral endarterectomy with bovine patch placement on 06/02/2022 by Dr. Trula Slade for left leg claudication.   Post operatively, she developed a seroma in the left groin.  At her visit with Dr. Trula Slade on 06/26/2022, the seroma was opened slightly with a CTA and drained several hundred cc's of clear fluid.  She was given a week of Keflex.  She was seen back on 07/03/2022 and she was continuing to have serous fluid from the the left groin that was saturating her dressing about every hour.   She underwent I&D of left groin seroma with Kerecis xenograft placement and abx beads and wound vac placement on 07/05/2022 by Dr. Trula Slade.    Later that day, she had bleeding from the left groin and was taken back to the OR emergently for removal of left femoral bovine pericardial patch and replacement with GSV patch angioplasty, placement of Kerecis xenograft and abx beads with vac placement also by Dr. Trula Slade.    Her intraoperative cultures grew pseudomonas and she had been on IV Cipro and was sent home on po Cipro for 10 days.    She was seen back in the office on 07/10/2022 for the first vac change.  There was no signs of infection and her LLE was warm and well perfused.  Her cipro was extended for a total of 6 weeks and she was scheduled for 2 week wound check.    Pt returns today for follow up and here with her husband.  Pt states her wound has been healing nicely.  Her left foot feels good and no issues.  She continues to take her abx.  She states that Dr. Einar Gip has plans for intervention on the right leg in June.     Allergies  Allergen Reactions   Thorazine [Chlorpromazine] Anaphylaxis    Current Outpatient Medications  Medication Sig Dispense Refill   acetaminophen (TYLENOL) 500 MG tablet Take 1,000 mg by mouth every 6 (six) hours as needed for moderate pain or mild pain.     albuterol (PROVENTIL  HFA;VENTOLIN HFA) 108 (90 Base) MCG/ACT inhaler Inhale 2 puffs into the lungs every 4 (four) hours as needed for wheezing or shortness of breath.     amLODipine (NORVASC) 5 MG tablet Take 5 mg by mouth daily.     aspirin EC 81 MG tablet Take 1 tablet (81 mg total) by mouth daily. Swallow whole. 30 tablet 12   bisoprolol (ZEBETA) 5 MG tablet TAKE 1 TABLET BY MOUTH DAILY 90 tablet 1   BREZTRI AEROSPHERE 160-9-4.8 MCG/ACT AERO INHALE 2 INHALATIONS BY MOUTH  INTO THE LUNGS IN THE MORNING  AND AT BEDTIME 32.1 g 3   buPROPion (WELLBUTRIN XL) 150 MG 24 hr tablet Take 150 mg by mouth daily. Take with 300 mg to = 450 mg daily     buPROPion (WELLBUTRIN XL) 300 MG 24 hr tablet Take 300 mg by mouth daily. Take with the 150 to = 450 mg daily     ciprofloxacin (CIPRO) 500 MG tablet Take 1 tablet (500 mg total) by mouth 2 (two) times daily. 64 tablet 0   clopidogrel (PLAVIX) 75 MG tablet Take 75 mg by mouth daily.     DULoxetine (CYMBALTA) 20 MG capsule Take 20 mg by mouth daily.     famotidine (PEPCID) 20 MG tablet Take 20 mg by mouth daily.  HYDROcodone-acetaminophen (NORCO/VICODIN) 5-325 MG tablet Take 1 tablet by mouth every 6 (six) hours as needed for moderate pain. 12 tablet 0   hydroxychloroquine (PLAQUENIL) 200 MG tablet Take 200 mg by mouth daily.     leflunomide (ARAVA) 20 MG tablet Take 20 mg by mouth daily.     Multiple Vitamins-Minerals (MULTIVITAMIN WOMEN) TABS Take 1 tablet by mouth daily.     rosuvastatin (CRESTOR) 5 MG tablet Take 5 mg by mouth daily.     triamterene-hydrochlorothiazide (MAXZIDE-25) 37.5-25 MG tablet Take 0.5 tablets by mouth daily.     No current facility-administered medications for this visit.     ROS:  See HPI  Physical Exam:  Today's Vitals   07/25/22 1413  BP: 132/78  Pulse: (!) 104  Resp: 14  Temp: (!) 97.4 F (36.3 C)  TempSrc: Temporal  SpO2: 99%  Weight: 148 lb (67.1 kg)  Height: '5\' 6"'$  (1.676 m)   Body mass index is 23.89 kg/m.   Incision:   healing nicely and almost healed.  Extremities:  easily palpable left DP pulse.  Left foot warm and motor and sensory are in tact.      Assessment/Plan:  This is a 76 y.o. female who is s/p: eft femoral endarterectomy with bovine patch placement on 06/02/2022 by Dr. Trula Slade for left leg claudication.   Post operatively, she developed a seroma in the left groin.    She underwent I&D of left groin seroma with Kerecis xenograft placement and abx beads and wound vac placement on 07/05/2022 by Dr. Trula Slade.    Later that day, she had bleeding from the left groin and was taken back to the OR emergently for removal of left femoral bovine pericardial patch and replacement with GSV patch angioplasty, placement of Kerecis xenograft and abx beads with vac placement also by Dr. Trula Slade.    -pt doing well and incision is nearly healed.  Her husband is using a small rolled wet to dry gauze to incisional wound.  Discussed with pt that she can shower daily with soap and water and then apply dressing.  She is ok to slowly resume activities such as driving and lifting. -will have her return in 4 weeks to see Dr. Trula Slade to inspect wound and feel it will be healed by that time.  Will not order non invasive studies given Dr. Einar Gip will be following the pt.   -She will continue her abx until she has completed the course.  -continue asa/plavix/statin   Leontine Locket, [facility] [phone]   Clinic MD:  Stanford Breed

## 2022-07-21 DIAGNOSIS — Z48812 Encounter for surgical aftercare following surgery on the circulatory system: Secondary | ICD-10-CM | POA: Diagnosis not present

## 2022-07-21 DIAGNOSIS — I10 Essential (primary) hypertension: Secondary | ICD-10-CM | POA: Diagnosis not present

## 2022-07-21 DIAGNOSIS — K219 Gastro-esophageal reflux disease without esophagitis: Secondary | ICD-10-CM | POA: Diagnosis not present

## 2022-07-21 DIAGNOSIS — I70212 Atherosclerosis of native arteries of extremities with intermittent claudication, left leg: Secondary | ICD-10-CM | POA: Diagnosis not present

## 2022-07-21 DIAGNOSIS — Z9862 Peripheral vascular angioplasty status: Secondary | ICD-10-CM | POA: Diagnosis not present

## 2022-07-21 DIAGNOSIS — J449 Chronic obstructive pulmonary disease, unspecified: Secondary | ICD-10-CM | POA: Diagnosis not present

## 2022-07-25 ENCOUNTER — Ambulatory Visit (INDEPENDENT_AMBULATORY_CARE_PROVIDER_SITE_OTHER): Payer: Medicare Other | Admitting: Physician Assistant

## 2022-07-25 VITALS — BP 132/78 | HR 104 | Temp 97.4°F | Resp 14 | Ht 66.0 in | Wt 148.0 lb

## 2022-07-25 DIAGNOSIS — L7634 Postprocedural seroma of skin and subcutaneous tissue following other procedure: Secondary | ICD-10-CM

## 2022-07-25 DIAGNOSIS — I739 Peripheral vascular disease, unspecified: Secondary | ICD-10-CM

## 2022-07-26 DIAGNOSIS — I70212 Atherosclerosis of native arteries of extremities with intermittent claudication, left leg: Secondary | ICD-10-CM | POA: Diagnosis not present

## 2022-07-26 DIAGNOSIS — Z48812 Encounter for surgical aftercare following surgery on the circulatory system: Secondary | ICD-10-CM | POA: Diagnosis not present

## 2022-07-26 DIAGNOSIS — Z9862 Peripheral vascular angioplasty status: Secondary | ICD-10-CM | POA: Diagnosis not present

## 2022-07-26 DIAGNOSIS — J449 Chronic obstructive pulmonary disease, unspecified: Secondary | ICD-10-CM | POA: Diagnosis not present

## 2022-07-26 DIAGNOSIS — K219 Gastro-esophageal reflux disease without esophagitis: Secondary | ICD-10-CM | POA: Diagnosis not present

## 2022-07-26 DIAGNOSIS — I10 Essential (primary) hypertension: Secondary | ICD-10-CM | POA: Diagnosis not present

## 2022-07-28 DIAGNOSIS — I70212 Atherosclerosis of native arteries of extremities with intermittent claudication, left leg: Secondary | ICD-10-CM | POA: Diagnosis not present

## 2022-07-28 DIAGNOSIS — Z9862 Peripheral vascular angioplasty status: Secondary | ICD-10-CM | POA: Diagnosis not present

## 2022-07-28 DIAGNOSIS — K219 Gastro-esophageal reflux disease without esophagitis: Secondary | ICD-10-CM | POA: Diagnosis not present

## 2022-07-28 DIAGNOSIS — I10 Essential (primary) hypertension: Secondary | ICD-10-CM | POA: Diagnosis not present

## 2022-07-28 DIAGNOSIS — Z48812 Encounter for surgical aftercare following surgery on the circulatory system: Secondary | ICD-10-CM | POA: Diagnosis not present

## 2022-07-28 DIAGNOSIS — J449 Chronic obstructive pulmonary disease, unspecified: Secondary | ICD-10-CM | POA: Diagnosis not present

## 2022-08-01 DIAGNOSIS — J449 Chronic obstructive pulmonary disease, unspecified: Secondary | ICD-10-CM | POA: Diagnosis not present

## 2022-08-01 DIAGNOSIS — I70212 Atherosclerosis of native arteries of extremities with intermittent claudication, left leg: Secondary | ICD-10-CM | POA: Diagnosis not present

## 2022-08-01 DIAGNOSIS — Z9862 Peripheral vascular angioplasty status: Secondary | ICD-10-CM | POA: Diagnosis not present

## 2022-08-01 DIAGNOSIS — Z48812 Encounter for surgical aftercare following surgery on the circulatory system: Secondary | ICD-10-CM | POA: Diagnosis not present

## 2022-08-01 DIAGNOSIS — K219 Gastro-esophageal reflux disease without esophagitis: Secondary | ICD-10-CM | POA: Diagnosis not present

## 2022-08-01 DIAGNOSIS — I10 Essential (primary) hypertension: Secondary | ICD-10-CM | POA: Diagnosis not present

## 2022-08-02 DIAGNOSIS — J449 Chronic obstructive pulmonary disease, unspecified: Secondary | ICD-10-CM | POA: Diagnosis not present

## 2022-08-02 DIAGNOSIS — K219 Gastro-esophageal reflux disease without esophagitis: Secondary | ICD-10-CM | POA: Diagnosis not present

## 2022-08-02 DIAGNOSIS — I10 Essential (primary) hypertension: Secondary | ICD-10-CM | POA: Diagnosis not present

## 2022-08-02 DIAGNOSIS — Z48812 Encounter for surgical aftercare following surgery on the circulatory system: Secondary | ICD-10-CM | POA: Diagnosis not present

## 2022-08-02 DIAGNOSIS — Z9862 Peripheral vascular angioplasty status: Secondary | ICD-10-CM | POA: Diagnosis not present

## 2022-08-02 DIAGNOSIS — I70212 Atherosclerosis of native arteries of extremities with intermittent claudication, left leg: Secondary | ICD-10-CM | POA: Diagnosis not present

## 2022-08-04 NOTE — Telephone Encounter (Signed)
Appointment has been scheduled.

## 2022-08-06 DIAGNOSIS — I70212 Atherosclerosis of native arteries of extremities with intermittent claudication, left leg: Secondary | ICD-10-CM | POA: Diagnosis not present

## 2022-08-06 DIAGNOSIS — J449 Chronic obstructive pulmonary disease, unspecified: Secondary | ICD-10-CM | POA: Diagnosis not present

## 2022-08-06 DIAGNOSIS — I97648 Postprocedural seroma of a circulatory system organ or structure following other circulatory system procedure: Secondary | ICD-10-CM | POA: Diagnosis not present

## 2022-08-06 DIAGNOSIS — I10 Essential (primary) hypertension: Secondary | ICD-10-CM | POA: Diagnosis not present

## 2022-08-06 DIAGNOSIS — Z9862 Peripheral vascular angioplasty status: Secondary | ICD-10-CM | POA: Diagnosis not present

## 2022-08-06 DIAGNOSIS — Z7982 Long term (current) use of aspirin: Secondary | ICD-10-CM | POA: Diagnosis not present

## 2022-08-06 DIAGNOSIS — Z85828 Personal history of other malignant neoplasm of skin: Secondary | ICD-10-CM | POA: Diagnosis not present

## 2022-08-06 DIAGNOSIS — I739 Peripheral vascular disease, unspecified: Secondary | ICD-10-CM | POA: Diagnosis not present

## 2022-08-06 DIAGNOSIS — Z79891 Long term (current) use of opiate analgesic: Secondary | ICD-10-CM | POA: Diagnosis not present

## 2022-08-06 DIAGNOSIS — Z48812 Encounter for surgical aftercare following surgery on the circulatory system: Secondary | ICD-10-CM | POA: Diagnosis not present

## 2022-08-06 DIAGNOSIS — Z7902 Long term (current) use of antithrombotics/antiplatelets: Secondary | ICD-10-CM | POA: Diagnosis not present

## 2022-08-06 DIAGNOSIS — K219 Gastro-esophageal reflux disease without esophagitis: Secondary | ICD-10-CM | POA: Diagnosis not present

## 2022-08-07 DIAGNOSIS — L820 Inflamed seborrheic keratosis: Secondary | ICD-10-CM | POA: Diagnosis not present

## 2022-08-09 ENCOUNTER — Ambulatory Visit (HOSPITAL_BASED_OUTPATIENT_CLINIC_OR_DEPARTMENT_OTHER)
Admission: RE | Admit: 2022-08-09 | Discharge: 2022-08-09 | Disposition: A | Discharge status: Home / Self Care | Payer: Medicare Other | Source: Ambulatory Visit | Attending: Internal Medicine | Admitting: Internal Medicine

## 2022-08-09 DIAGNOSIS — J439 Emphysema, unspecified: Secondary | ICD-10-CM | POA: Diagnosis not present

## 2022-08-09 DIAGNOSIS — Z9862 Peripheral vascular angioplasty status: Secondary | ICD-10-CM | POA: Diagnosis not present

## 2022-08-09 DIAGNOSIS — R911 Solitary pulmonary nodule: Secondary | ICD-10-CM

## 2022-08-09 DIAGNOSIS — I739 Peripheral vascular disease, unspecified: Secondary | ICD-10-CM | POA: Diagnosis not present

## 2022-08-09 DIAGNOSIS — J479 Bronchiectasis, uncomplicated: Secondary | ICD-10-CM | POA: Diagnosis not present

## 2022-08-09 DIAGNOSIS — Z48812 Encounter for surgical aftercare following surgery on the circulatory system: Secondary | ICD-10-CM | POA: Diagnosis not present

## 2022-08-09 DIAGNOSIS — I10 Essential (primary) hypertension: Secondary | ICD-10-CM | POA: Diagnosis not present

## 2022-08-09 DIAGNOSIS — I70212 Atherosclerosis of native arteries of extremities with intermittent claudication, left leg: Secondary | ICD-10-CM | POA: Diagnosis not present

## 2022-08-09 DIAGNOSIS — J449 Chronic obstructive pulmonary disease, unspecified: Secondary | ICD-10-CM | POA: Diagnosis not present

## 2022-08-09 DIAGNOSIS — R918 Other nonspecific abnormal finding of lung field: Secondary | ICD-10-CM | POA: Diagnosis not present

## 2022-08-10 NOTE — Progress Notes (Signed)
Called and left detailed msg on machine for the pt ok per DPR. Future order placed.

## 2022-08-17 DIAGNOSIS — Z48812 Encounter for surgical aftercare following surgery on the circulatory system: Secondary | ICD-10-CM | POA: Diagnosis not present

## 2022-08-17 DIAGNOSIS — I739 Peripheral vascular disease, unspecified: Secondary | ICD-10-CM | POA: Diagnosis not present

## 2022-08-17 DIAGNOSIS — I70212 Atherosclerosis of native arteries of extremities with intermittent claudication, left leg: Secondary | ICD-10-CM | POA: Diagnosis not present

## 2022-08-17 DIAGNOSIS — I10 Essential (primary) hypertension: Secondary | ICD-10-CM | POA: Diagnosis not present

## 2022-08-17 DIAGNOSIS — J449 Chronic obstructive pulmonary disease, unspecified: Secondary | ICD-10-CM | POA: Diagnosis not present

## 2022-08-17 DIAGNOSIS — Z9862 Peripheral vascular angioplasty status: Secondary | ICD-10-CM | POA: Diagnosis not present

## 2022-08-22 ENCOUNTER — Ambulatory Visit: Payer: Medicare Other

## 2022-08-23 DIAGNOSIS — I739 Peripheral vascular disease, unspecified: Secondary | ICD-10-CM | POA: Diagnosis not present

## 2022-08-23 DIAGNOSIS — Z48812 Encounter for surgical aftercare following surgery on the circulatory system: Secondary | ICD-10-CM | POA: Diagnosis not present

## 2022-08-23 DIAGNOSIS — I70212 Atherosclerosis of native arteries of extremities with intermittent claudication, left leg: Secondary | ICD-10-CM | POA: Diagnosis not present

## 2022-08-23 DIAGNOSIS — I10 Essential (primary) hypertension: Secondary | ICD-10-CM | POA: Diagnosis not present

## 2022-08-23 DIAGNOSIS — Z9862 Peripheral vascular angioplasty status: Secondary | ICD-10-CM | POA: Diagnosis not present

## 2022-08-23 DIAGNOSIS — J449 Chronic obstructive pulmonary disease, unspecified: Secondary | ICD-10-CM | POA: Diagnosis not present

## 2022-08-24 ENCOUNTER — Encounter: Payer: Self-pay | Admitting: Cardiology

## 2022-08-24 DIAGNOSIS — R7309 Other abnormal glucose: Secondary | ICD-10-CM | POA: Diagnosis not present

## 2022-08-24 DIAGNOSIS — E785 Hyperlipidemia, unspecified: Secondary | ICD-10-CM | POA: Diagnosis not present

## 2022-08-24 NOTE — Telephone Encounter (Signed)
From patient

## 2022-08-28 ENCOUNTER — Encounter: Payer: Self-pay | Admitting: Physician Assistant

## 2022-08-28 ENCOUNTER — Ambulatory Visit (INDEPENDENT_AMBULATORY_CARE_PROVIDER_SITE_OTHER): Payer: Medicare Other | Admitting: Physician Assistant

## 2022-08-28 VITALS — BP 120/76 | HR 85 | Temp 98.2°F | Resp 14 | Ht 66.0 in | Wt 152.0 lb

## 2022-08-28 DIAGNOSIS — L7634 Postprocedural seroma of skin and subcutaneous tissue following other procedure: Secondary | ICD-10-CM

## 2022-08-28 DIAGNOSIS — I739 Peripheral vascular disease, unspecified: Secondary | ICD-10-CM

## 2022-08-28 NOTE — Progress Notes (Signed)
POST OPERATIVE OFFICE NOTE    CC:  F/u for surgery  HPI:  This is a 77 y.o. female who is s/p left femoral endarterectomy with bovine patch placement on 06/02/2022 by Dr. Trula Slade for left leg claudication.   Post operatively, she developed a seroma in the left groin.  At her visit with Dr. Trula Slade on 06/26/2022, the seroma was opened slightly with a CTA and drained several hundred cc's of clear fluid.  She was given a week of Keflex.  She was seen back on 07/03/2022 and she was continuing to have serous fluid from the the left groin that was saturating her dressing about every hour.   She underwent I&D of left groin seroma with Kerecis xenograft placement and abx beads and wound vac placement on 07/05/2022 by Dr. Trula Slade.    Later that day, she had bleeding from the left groin and was taken back to the OR emergently for removal of left femoral bovine pericardial patch and replacement with GSV patch angioplasty, placement of Kerecis xenograft and abx beads with vac placement also by Dr. Trula Slade.     Her intraoperative cultures grew pseudomonas and she had been on IV Cipro and was sent home on po Cipro for 10 days.    Pt returns today for follow up.  Pt states she has completely healed and doing well.  She does not have any pain in her left foot.  She states that Dr. Einar Gip will proceed with the right leg around June.     Allergies  Allergen Reactions   Thorazine [Chlorpromazine] Anaphylaxis    Current Outpatient Medications  Medication Sig Dispense Refill   acetaminophen (TYLENOL) 500 MG tablet Take 1,000 mg by mouth every 6 (six) hours as needed for moderate pain or mild pain.     albuterol (PROVENTIL HFA;VENTOLIN HFA) 108 (90 Base) MCG/ACT inhaler Inhale 2 puffs into the lungs every 4 (four) hours as needed for wheezing or shortness of breath.     amLODipine (NORVASC) 5 MG tablet Take 5 mg by mouth daily.     aspirin EC 81 MG tablet Take 1 tablet (81 mg total) by mouth daily. Swallow whole. 30  tablet 12   bisoprolol (ZEBETA) 5 MG tablet TAKE 1 TABLET BY MOUTH DAILY 90 tablet 1   BREZTRI AEROSPHERE 160-9-4.8 MCG/ACT AERO INHALE 2 INHALATIONS BY MOUTH  INTO THE LUNGS IN THE MORNING  AND AT BEDTIME 32.1 g 3   buPROPion (WELLBUTRIN XL) 150 MG 24 hr tablet Take 150 mg by mouth daily. Take with 300 mg to = 450 mg daily     buPROPion (WELLBUTRIN XL) 300 MG 24 hr tablet Take 300 mg by mouth daily. Take with the 150 to = 450 mg daily     clopidogrel (PLAVIX) 75 MG tablet Take 75 mg by mouth daily.     DULoxetine (CYMBALTA) 20 MG capsule Take 20 mg by mouth daily.     famotidine (PEPCID) 20 MG tablet Take 20 mg by mouth daily.     HYDROcodone-acetaminophen (NORCO/VICODIN) 5-325 MG tablet Take 1 tablet by mouth every 6 (six) hours as needed for moderate pain. (Patient not taking: Reported on 07/25/2022) 12 tablet 0   hydroxychloroquine (PLAQUENIL) 200 MG tablet Take 200 mg by mouth daily.     leflunomide (ARAVA) 20 MG tablet Take 20 mg by mouth daily.     Multiple Vitamins-Minerals (MULTIVITAMIN WOMEN) TABS Take 1 tablet by mouth daily.     rosuvastatin (CRESTOR) 5 MG tablet Take 5  mg by mouth daily.     triamterene-hydrochlorothiazide (MAXZIDE-25) 37.5-25 MG tablet Take 0.5 tablets by mouth daily.     No current facility-administered medications for this visit.     ROS:  See HPI  Physical Exam:  Today's Vitals   08/28/22 1449  BP: 120/76  Pulse: 85  Resp: 14  Temp: 98.2 F (36.8 C)  TempSrc: Temporal  SpO2: 94%  Weight: 152 lb (68.9 kg)  Height: '5\' 6"'$  (1.676 m)   Body mass index is 24.53 kg/m.   Incision:  left groin incision has completely healed.  Extremities:  palpable left DP pulse.  Right foot is warm. Pulses are not palpable.     Assessment/Plan:  This is a 77 y.o. female who is s/p: left femoral endarterectomy with bovine patch placement on 06/02/2022 by Dr. Trula Slade for left leg claudication.   Post operatively, she developed a seroma in the left groin.    She  underwent I&D of left groin seroma with Kerecis xenograft placement and abx beads and wound vac placement on 07/05/2022 by Dr. Trula Slade.    Later that day, she had bleeding from the left groin and was taken back to the OR emergently for removal of left femoral bovine pericardial patch and replacement with GSV patch angioplasty, placement of Kerecis xenograft and abx beads with vac placement also by Dr. Trula Slade.     -pt's left groin incision has completely healed.  She has a palpable left DP pulse.   -given Dr. Einar Gip will follow her PAD from here on out, she can follow up with [facility] as needed.  We will be glad to see her back with any issues if needed.     Leontine Locket, [facility] [phone]   Clinic MD:  Trula Slade

## 2022-08-30 DIAGNOSIS — J309 Allergic rhinitis, unspecified: Secondary | ICD-10-CM | POA: Diagnosis not present

## 2022-08-30 DIAGNOSIS — Z0000 Encounter for general adult medical examination without abnormal findings: Secondary | ICD-10-CM | POA: Diagnosis not present

## 2022-08-30 DIAGNOSIS — F321 Major depressive disorder, single episode, moderate: Secondary | ICD-10-CM | POA: Diagnosis not present

## 2022-08-30 DIAGNOSIS — J449 Chronic obstructive pulmonary disease, unspecified: Secondary | ICD-10-CM | POA: Diagnosis not present

## 2022-08-30 DIAGNOSIS — I700 Atherosclerosis of aorta: Secondary | ICD-10-CM | POA: Diagnosis not present

## 2022-08-30 DIAGNOSIS — I6523 Occlusion and stenosis of bilateral carotid arteries: Secondary | ICD-10-CM | POA: Diagnosis not present

## 2022-08-30 DIAGNOSIS — M0579 Rheumatoid arthritis with rheumatoid factor of multiple sites without organ or systems involvement: Secondary | ICD-10-CM | POA: Diagnosis not present

## 2022-08-30 DIAGNOSIS — M810 Age-related osteoporosis without current pathological fracture: Secondary | ICD-10-CM | POA: Diagnosis not present

## 2022-08-30 DIAGNOSIS — N1832 Chronic kidney disease, stage 3b: Secondary | ICD-10-CM | POA: Diagnosis not present

## 2022-08-30 DIAGNOSIS — I129 Hypertensive chronic kidney disease with stage 1 through stage 4 chronic kidney disease, or unspecified chronic kidney disease: Secondary | ICD-10-CM | POA: Diagnosis not present

## 2022-08-30 DIAGNOSIS — D84821 Immunodeficiency due to drugs: Secondary | ICD-10-CM | POA: Diagnosis not present

## 2022-08-31 DIAGNOSIS — M1990 Unspecified osteoarthritis, unspecified site: Secondary | ICD-10-CM | POA: Diagnosis not present

## 2022-08-31 DIAGNOSIS — M797 Fibromyalgia: Secondary | ICD-10-CM | POA: Diagnosis not present

## 2022-08-31 DIAGNOSIS — I70212 Atherosclerosis of native arteries of extremities with intermittent claudication, left leg: Secondary | ICD-10-CM | POA: Diagnosis not present

## 2022-08-31 DIAGNOSIS — Z48812 Encounter for surgical aftercare following surgery on the circulatory system: Secondary | ICD-10-CM | POA: Diagnosis not present

## 2022-08-31 DIAGNOSIS — N1831 Chronic kidney disease, stage 3a: Secondary | ICD-10-CM | POA: Diagnosis not present

## 2022-08-31 DIAGNOSIS — G5602 Carpal tunnel syndrome, left upper limb: Secondary | ICD-10-CM | POA: Diagnosis not present

## 2022-08-31 DIAGNOSIS — J449 Chronic obstructive pulmonary disease, unspecified: Secondary | ICD-10-CM | POA: Diagnosis not present

## 2022-08-31 DIAGNOSIS — Z1159 Encounter for screening for other viral diseases: Secondary | ICD-10-CM | POA: Diagnosis not present

## 2022-08-31 DIAGNOSIS — I10 Essential (primary) hypertension: Secondary | ICD-10-CM | POA: Diagnosis not present

## 2022-08-31 DIAGNOSIS — Z9862 Peripheral vascular angioplasty status: Secondary | ICD-10-CM | POA: Diagnosis not present

## 2022-08-31 DIAGNOSIS — R768 Other specified abnormal immunological findings in serum: Secondary | ICD-10-CM | POA: Diagnosis not present

## 2022-08-31 DIAGNOSIS — Z79899 Other long term (current) drug therapy: Secondary | ICD-10-CM | POA: Diagnosis not present

## 2022-08-31 DIAGNOSIS — M79643 Pain in unspecified hand: Secondary | ICD-10-CM | POA: Diagnosis not present

## 2022-08-31 DIAGNOSIS — I739 Peripheral vascular disease, unspecified: Secondary | ICD-10-CM | POA: Diagnosis not present

## 2022-08-31 DIAGNOSIS — M0579 Rheumatoid arthritis with rheumatoid factor of multiple sites without organ or systems involvement: Secondary | ICD-10-CM | POA: Diagnosis not present

## 2022-09-14 ENCOUNTER — Ambulatory Visit: Payer: Medicare Other | Admitting: Podiatry

## 2022-09-19 ENCOUNTER — Ambulatory Visit (INDEPENDENT_AMBULATORY_CARE_PROVIDER_SITE_OTHER): Payer: Medicare Other | Admitting: Podiatry

## 2022-09-19 ENCOUNTER — Ambulatory Visit (INDEPENDENT_AMBULATORY_CARE_PROVIDER_SITE_OTHER): Payer: Medicare Other

## 2022-09-19 DIAGNOSIS — M7751 Other enthesopathy of right foot: Secondary | ICD-10-CM | POA: Diagnosis not present

## 2022-09-19 DIAGNOSIS — M7750 Other enthesopathy of unspecified foot: Secondary | ICD-10-CM

## 2022-09-19 DIAGNOSIS — B353 Tinea pedis: Secondary | ICD-10-CM

## 2022-09-19 DIAGNOSIS — M2041 Other hammer toe(s) (acquired), right foot: Secondary | ICD-10-CM

## 2022-09-19 DIAGNOSIS — L84 Corns and callosities: Secondary | ICD-10-CM | POA: Diagnosis not present

## 2022-09-19 MED ORDER — CLINDAMYCIN PHOSPHATE 1 % EX GEL: Freq: Every day | CUTANEOUS | 0 refills | Status: DC

## 2022-09-19 MED ORDER — TERBINAFINE HCL 250 MG PO TABS: 250.0000 mg | ORAL_TABLET | Freq: Every day | ORAL | 0 refills | Status: DC

## 2022-09-19 MED ORDER — KETOCONAZOLE 2 % EX CREA: 1.0000 | TOPICAL_CREAM | Freq: Every day | CUTANEOUS | 2 refills | Status: DC

## 2022-09-23 NOTE — Progress Notes (Signed)
  Subjective:  Patient ID: Donna [name], female    DOB: 19-Dec-1945,  MRN: [id number]  Chief Complaint  Patient presents with   Foot Pain    Patient states that she has right foot pain between her 4th and 5th toe. About 6 months ago, she notice pus coming from an pin size site between the toe area. She didn't know what had caused it. She went to her PCP and was put on antibiotics for the wound.     77 y.o. female presents with the above complaint. History confirmed with patient.  She has a history of PAD and underwent left femoral endarterectomy complicated by rupture of the patch requiring revision and washout last fall.  The site is doing well.  She has noticed a spot between the right fourth and fifth toes that is irritated draining and painful.  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 Tinea pedis of right foot      Plan:  Patient was evaluated and treated and all questions answered.  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  We discussed treatment of this, she has been on antibiotics for this, I discussed with her that typically this requires antifungal and antibacterial treatment.  She has completed antibiotics.  I recommended treatment with oral terbinafine, topical ketoconazole and topical clindamycin gel.  Discussed hygiene and drying of the interspace in order to prevent maceration and bacterial and fungal load.  I will see her back in 4 weeks for follow-up.  Ultimately we discussed possibility of need for possible syndactylization of the lesion if it does not improve.  Return in about 4  weeks (around 10/17/2022) for follow up on sore between toes right foot.

## 2022-09-28 DIAGNOSIS — M0579 Rheumatoid arthritis with rheumatoid factor of multiple sites without organ or systems involvement: Secondary | ICD-10-CM | POA: Diagnosis not present

## 2022-10-07 DIAGNOSIS — D6869 Other thrombophilia: Secondary | ICD-10-CM | POA: Insufficient documentation

## 2022-10-07 DIAGNOSIS — I739 Peripheral vascular disease, unspecified: Secondary | ICD-10-CM | POA: Diagnosis not present

## 2022-10-07 DIAGNOSIS — M5416 Radiculopathy, lumbar region: Secondary | ICD-10-CM | POA: Diagnosis not present

## 2022-10-07 DIAGNOSIS — M961 Postlaminectomy syndrome, not elsewhere classified: Secondary | ICD-10-CM | POA: Diagnosis not present

## 2022-10-09 IMAGING — DX DG CHEST 2V
2 series · 2 of 2 positions shown · non-contrast
Comparison: September 23, 2020

CLINICAL DATA: Cough

EXAM:
CHEST - 2 VIEW

[chest pa]
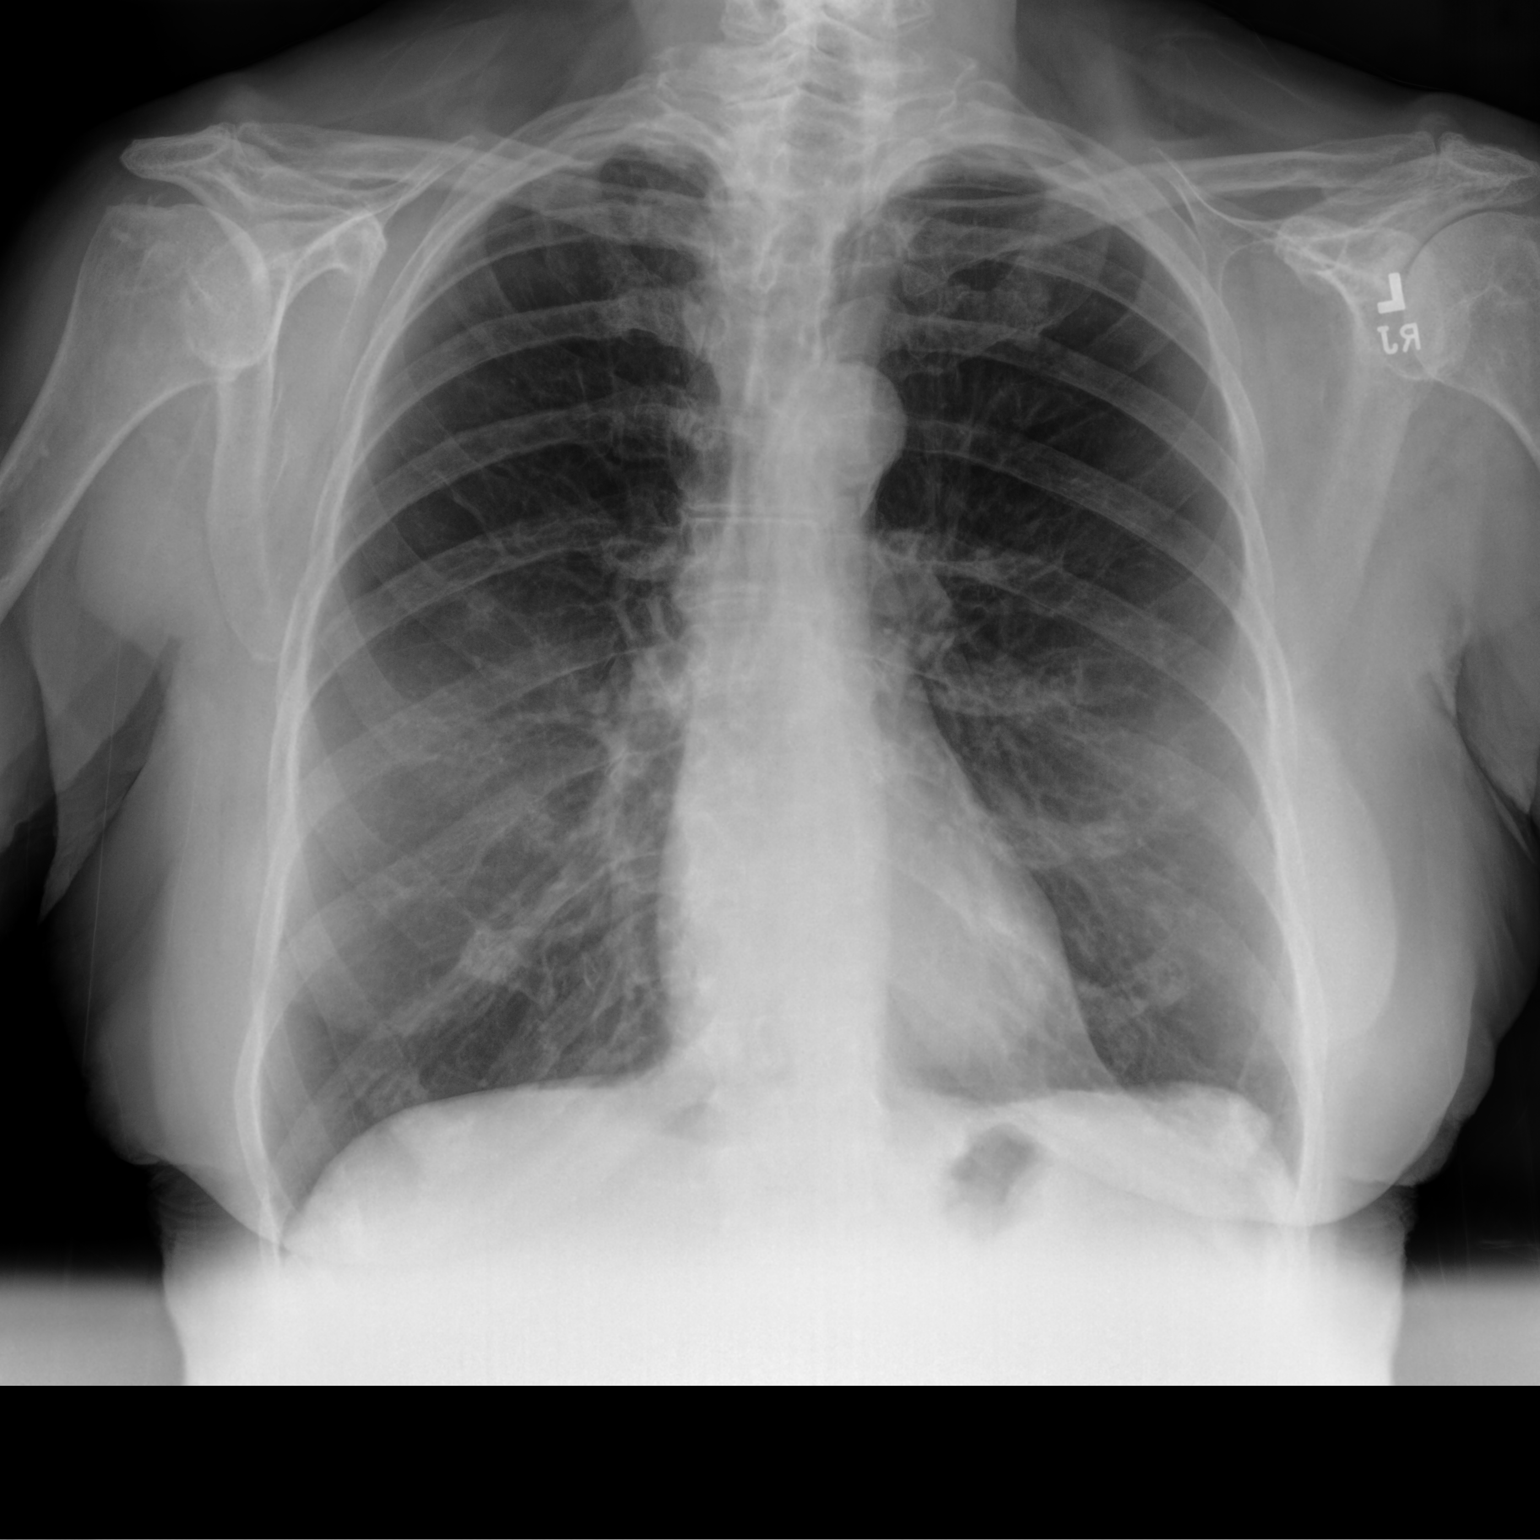

[chest lat]
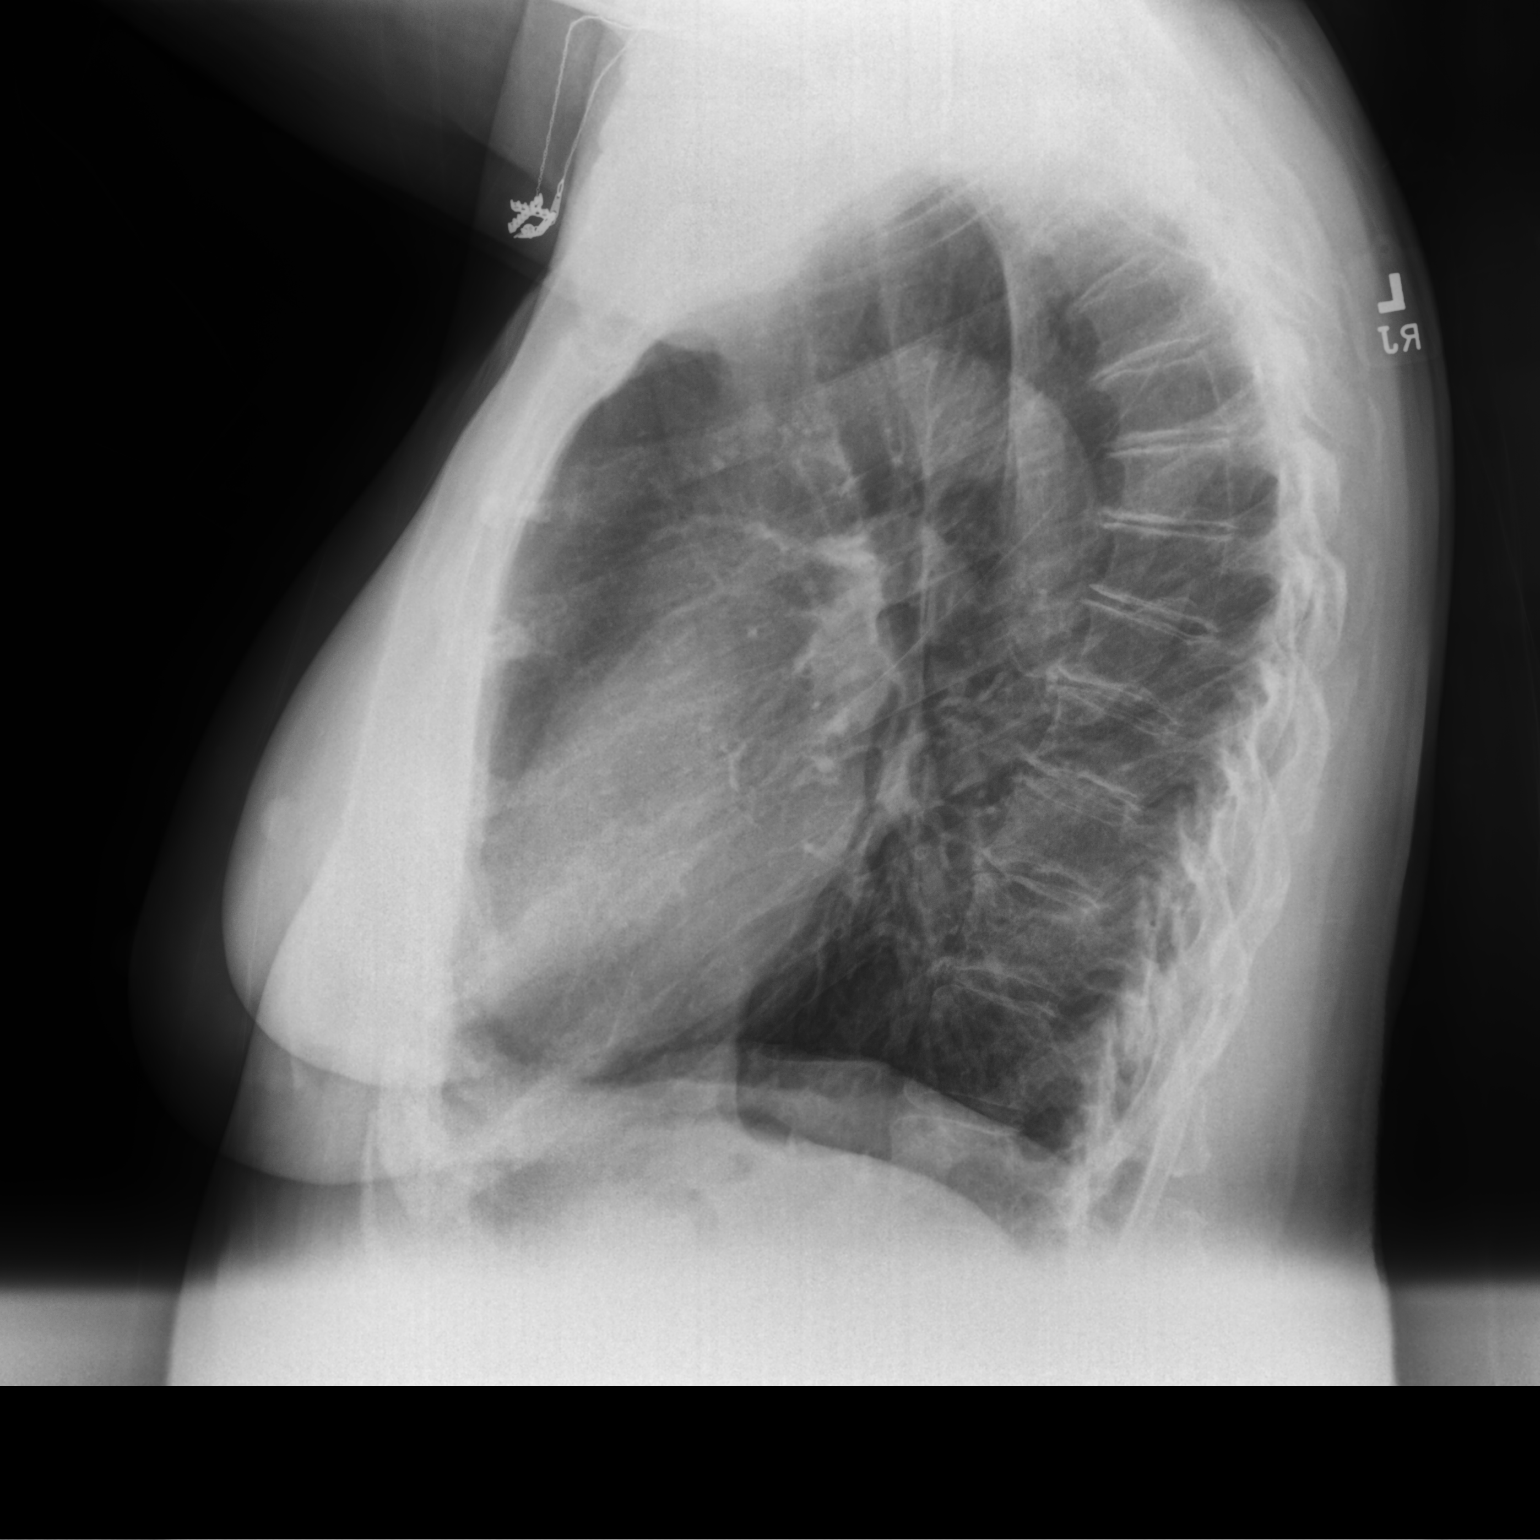

[2 of 2 positions shown; findings below may reference images not displayed]

FINDINGS: The cardiomediastinal silhouette is normal. No pneumothorax. The
left lung is clear. Mild opacity in the medial right lung base could
represent confluence of shadows versus subtle infiltrate. No other
acute abnormalities.
IMPRESSION: Mild opacity in the medial right lung base is somewhat rounded and
nodular in appearance. Confluence of shadows is favored. However,
subtle early developing opacity is not excluded. Recommend
short-term follow-up imaging after treatment to ensure resolution.

## 2022-10-13 DIAGNOSIS — H5213 Myopia, bilateral: Secondary | ICD-10-CM | POA: Diagnosis not present

## 2022-10-13 DIAGNOSIS — Z79899 Other long term (current) drug therapy: Secondary | ICD-10-CM | POA: Diagnosis not present

## 2022-10-13 DIAGNOSIS — H2513 Age-related nuclear cataract, bilateral: Secondary | ICD-10-CM | POA: Diagnosis not present

## 2022-10-17 DIAGNOSIS — M5416 Radiculopathy, lumbar region: Secondary | ICD-10-CM | POA: Diagnosis not present

## 2022-10-18 ENCOUNTER — Ambulatory Visit: Payer: Medicare Other | Admitting: Podiatry

## 2022-10-18 ENCOUNTER — Telehealth: Payer: Self-pay

## 2022-10-18 DIAGNOSIS — M50322 Other cervical disc degeneration at C5-C6 level: Secondary | ICD-10-CM | POA: Diagnosis not present

## 2022-10-18 DIAGNOSIS — M9901 Segmental and somatic dysfunction of cervical region: Secondary | ICD-10-CM | POA: Diagnosis not present

## 2022-10-18 NOTE — Telephone Encounter (Signed)
Patient calling to talk about a sore on her right foot between her toes. She has had two doctors (Podiatrist said it was a viral & bacteria infection) look at this sore, and wants you to take another look at it.She says it is getting worse and now looks like a hole. She has been on 2 rounds of antibiotics with no improvement. She says you previously looked at it but weren't concerned.   Call back #- [phone]

## 2022-10-18 NOTE — Telephone Encounter (Signed)
Donna [name] has gotten her scheduled for 10/25/22 @1 :30. Is this okay?

## 2022-10-18 NOTE — Telephone Encounter (Signed)
Patient called into office stating she has a sore spot located between her 4th and 5th toes. I advised pt that per her last ov with [place] in January, It was noted that Dr. Einar Gip is currently following her for PAD. I advised that she would need to contact his office first to be seen and if anything additional is needed from our office Dr. Einar Gip would need to reach out to [place] for a further evaluation. Pt voiced her understanding.

## 2022-10-18 NOTE — Telephone Encounter (Signed)
Please schedule her for an ov sooner than she has. I cn potentially see her Friday or any other open days

## 2022-10-18 NOTE — Telephone Encounter (Signed)
Yes

## 2022-10-23 ENCOUNTER — Ambulatory Visit: Payer: Medicare Other | Admitting: Cardiology

## 2022-10-24 DIAGNOSIS — M50322 Other cervical disc degeneration at C5-C6 level: Secondary | ICD-10-CM | POA: Diagnosis not present

## 2022-10-24 DIAGNOSIS — M9901 Segmental and somatic dysfunction of cervical region: Secondary | ICD-10-CM | POA: Diagnosis not present

## 2022-10-25 ENCOUNTER — Ambulatory Visit: Payer: Medicare Other | Admitting: Cardiology

## 2022-10-25 ENCOUNTER — Encounter: Payer: Self-pay | Admitting: Cardiology

## 2022-10-25 VITALS — BP 149/76 | HR 70 | Ht 66.0 in | Wt 157.0 lb

## 2022-10-25 DIAGNOSIS — Z9889 Other specified postprocedural states: Secondary | ICD-10-CM | POA: Diagnosis not present

## 2022-10-25 DIAGNOSIS — E7800 Pure hypercholesterolemia, unspecified: Secondary | ICD-10-CM | POA: Diagnosis not present

## 2022-10-25 DIAGNOSIS — I739 Peripheral vascular disease, unspecified: Secondary | ICD-10-CM

## 2022-10-25 DIAGNOSIS — I10 Essential (primary) hypertension: Secondary | ICD-10-CM

## 2022-10-25 NOTE — Progress Notes (Signed)
Primary Physician/Referring:  Deland Pretty, MD  Patient ID: Donna [name], female    DOB: Jul 11, 1946, 77 y.o.   MRN: [id number]  Chief Complaint  Patient presents with   PAD (peripheral artery disease) ([place])   Follow-up    Foot ulcer   HPI:    Donna [name]  is a 77 y.o. female patient with hypertension, hyperlipidemia, degenerative joint disease and chronic back pain,  hyperglycemia, stage IIIa chronic kidney disease, prior tobacco use disorder with approximately 20-pack-year history of smoking quit in 2000.   She presents here for follow-up of peripheral artery disease.  Due to severe lifestyle-limiting claudication, underwent peripheral arteriogram on 05/30/2022 and found to have complex and calcified high-grade/occlusion of the left CFA, underwent left femoral artery endarterectomy by Dr. Trula Slade on 06/02/2022  but developed seroma and infection and hematoma leading to replacement with saphenous vein patch and also placed on wound VAC and antibiotics due to Pseudomonas infection. She now presents for follow-up.  She called our office and an appointment as she has developed a sore on her interdigits between right fourth and fifth toe.  She was concerned about worsening PAD and also having significant amount of pain at night with leg cramps.   Past Medical History:  Diagnosis Date   Arthritis    lumbar, toes, L knee, cerv. spine & hands  - rheumatoid   Cancer (HCC)    basal cell removed fr. face    Chronic kidney disease    stage 3   COPD (chronic obstructive pulmonary disease) (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 Leanora Ivanoff, but prev. saw Dr. Einar Gip   Peripheral vascular disease [facility])     Seasonal allergies    Past Surgical History:  Procedure Laterality Date   ABDOMINAL AORTOGRAM W/LOWER EXTREMITY N/A 05/30/2022   Procedure: ABDOMINAL AORTOGRAM W/LOWER EXTREMITY;  Surgeon: Adrian Prows, MD;  Location: [place] [facility];  Service: Cardiovascular;  Laterality: N/A;   APPLICATION OF WOUND VAC Left 07/05/2022   Procedure: APPLICATION OF WOUND VAC;  Surgeon: Serafina Mitchell, MD;  Location: [place];  Service: Vascular;  Laterality: Left;   APPLICATION OF WOUND VAC Left 07/05/2022   Procedure: APPLICATION OF WOUND VAC;  Surgeon: Serafina Mitchell, MD;  Location: [place];  Service: Vascular;  Laterality: Left;   BACK SURGERY  07/31/1992   cerv. spine fusion    BREAST ENHANCEMENT SURGERY     BREAST SURGERY  04/01/1979   implants- bilateral    CERVICAL SPINE SURGERY  07/31/1990   fusion   ENDARTERECTOMY FEMORAL Left 06/02/2022   Procedure: LEFT ENDARTERECTOMY FEMORAL with BOVINE PATCH;  Surgeon: Serafina Mitchell, MD;  Location: [facility];  Service: Vascular;  Laterality: Left;   GROIN DEBRIDEMENT Left 07/05/2022   Procedure: LEFT Karl Ito WITH STIMULAN ANTIBIOTIC BEAD AND KERECIS FISH SKIN GRAFT APPLICATION;  Surgeon: Serafina Mitchell, MD;  Location: [facility];  Service: Vascular;  Laterality: Left;   INCISION AND DRAINAGE Left 07/05/2022   Procedure: LEFT Karl Ito;  Surgeon: Serafina Mitchell, MD;  Location: [place];  Service: Vascular;  Laterality: Left;  JOINT REPLACEMENT Right 07/31/2005   LUMBAR LAMINECTOMY/DECOMPRESSION MICRODISCECTOMY Left 10/07/2015   Procedure: Lumbar three-five Decompressive lumbar Laminectomy & Left Lumbar four-five Microdiscectomy;  Surgeon: Jovita Gamma, MD;  Location: [place] [facility];  Service: Neurosurgery;  Laterality: Left;   LUMBAR SPINE SURGERY  2016   PATCH ANGIOPLASTY Left 07/05/2022   Procedure: VEIN PATCH ANGIOPLASTY;  Surgeon: Serafina Mitchell, MD;  Location: [facility];  Service: Vascular;  Laterality: Left;   TONSILLECTOMY     TOTAL HIP ARTHROPLASTY   07/31/2005   Right   TUBAL LIGATION     Family History  Problem Relation Age of Onset   Kidney cancer Mother    Lung cancer Sister    Heart disease Maternal Aunt    Heart disease Maternal Aunt    Kidney cancer Maternal Aunt    Kidney cancer Maternal Uncle     Social History   Tobacco Use   Smoking status: Former    Packs/day: 0.70    Years: 24.00    Additional pack years: 0.00    Total pack years: 16.80    Types: Cigarettes    Quit date: 07/31/2009    Years since quitting: 13.2   Smokeless tobacco: Former    Quit date: 10/01/1998  Substance Use Topics   Alcohol use: Yes    Comment: wine- rarely   Marital Status: Married  ROS  Review of Systems  Cardiovascular:  Positive for claudication, cyanosis and leg swelling. Negative for chest pain, dyspnea on exertion, paroxysmal nocturnal dyspnea and syncope.   Objective   Review of Systems  Cardiovascular:  Positive for claudication. Negative for chest pain, dyspnea on exertion and leg swelling.     Blood pressure (!) 149/76, pulse 70, height 5\' 6"  (1.676 m), weight 157 lb (71.2 kg), SpO2 97 %. Body mass index is 25.34 kg/m.     10/25/2022    1:23 PM 08/28/2022    2:49 PM 07/25/2022    2:13 PM  Vitals with BMI  Height 5\' 6"  5\' 6"  5\' 6"   Weight 157 lbs 152 lbs 148 lbs  BMI 25.35 [id number]  Systolic 123456 [id number]  Diastolic 76 76 78  Pulse 70 85 104    Physical Exam Neck:     Vascular: Carotid bruit (bilateral) present. No JVD.  Cardiovascular:     Rate and Rhythm: Normal rate and regular rhythm.     Pulses:          Radial pulses are 2+ on the right side and 2+ on the left side.       Femoral pulses are 1+ on the right side and 1+ on the left side.      Popliteal pulses are 0 on the right side and 0 on the left side.       Dorsalis pedis pulses are 0 on the right side and 0 on the left side.       Posterior tibial pulses are 0 on the right side and 0 on the left side.     Heart sounds: Normal heart sounds. No  murmur heard.    No gallop.     Comments:  Left groin site is healed well.   Pulmonary:     Effort: Pulmonary effort is normal.     Breath sounds: Normal breath sounds. No wheezing or rales.  Abdominal:     General: Bowel sounds are normal.     Palpations: Abdomen is soft.  Musculoskeletal:     Right lower leg:  No edema.     Left lower leg: No edema.  Feet:     Right foot:     Skin integrity: Skin breakdown and warmth present.     Left foot:     Skin integrity: Skin breakdown, erythema and warmth present.     Comments: Right fifth toe erythematous wound with purulent drainage. Discoloration noted to bilateral toes.   Medications and allergies   Allergies  Allergen Reactions   Thorazine [Chlorpromazine] Anaphylaxis     Medication list after today's encounter   Current Outpatient Medications:    amLODipine (NORVASC) 5 MG tablet, Take 5 mg by mouth daily., Disp: , Rfl:    bisoprolol (ZEBETA) 5 MG tablet, TAKE 1 TABLET BY MOUTH DAILY, Disp: 90 tablet, Rfl: 1   BREZTRI AEROSPHERE 160-9-4.8 MCG/ACT AERO, INHALE 2 INHALATIONS BY MOUTH  INTO THE LUNGS IN THE MORNING  AND AT BEDTIME, Disp: 32.1 g, Rfl: 3   buPROPion (WELLBUTRIN XL) 150 MG 24 hr tablet, Take 150 mg by mouth daily. Take with 300 mg to = 450 mg daily, Disp: , Rfl:    buPROPion (WELLBUTRIN XL) 300 MG 24 hr tablet, Take 300 mg by mouth daily. Take with the 150 to = 450 mg daily, Disp: , Rfl:    certolizumab pegol (CIMZIA) 2 X 200 MG KIT, Inject into the skin., Disp: , Rfl:    clindamycin (CLINDAGEL) 1 % gel, Apply topically daily., Disp: 30 g, Rfl: 0   clopidogrel (PLAVIX) 75 MG tablet, Take 75 mg by mouth daily., Disp: , Rfl:    co-enzyme Q-10 30 MG capsule, Take 100 mg by mouth daily., Disp: , Rfl:    DULoxetine (CYMBALTA) 20 MG capsule, Take 20 mg by mouth daily., Disp: , Rfl:    famotidine (PEPCID) 20 MG tablet, Take 20 mg by mouth daily., Disp: , Rfl:    HYDROcodone-acetaminophen (NORCO/VICODIN) 5-325 MG tablet, Take  1 tablet by mouth every 6 (six) hours as needed for moderate pain., Disp: 12 tablet, Rfl: 0   hydroxychloroquine (PLAQUENIL) 200 MG tablet, Take 200 mg by mouth daily., Disp: , Rfl:    ipratropium (ATROVENT) 0.06 % nasal spray, Place 1 spray into both nostrils 3 (three) times daily., Disp: , Rfl:    ketoconazole (NIZORAL) 2 % cream, Apply 1 Application topically daily., Disp: 30 g, Rfl: 2   leflunomide (ARAVA) 20 MG tablet, Take 20 mg by mouth daily., Disp: , Rfl:    Multiple Vitamins-Minerals (MULTIVITAMIN WOMEN) TABS, Take 1 tablet by mouth daily., Disp: , Rfl:    rosuvastatin (CRESTOR) 5 MG tablet, Take 5 mg by mouth daily., Disp: , Rfl:    terbinafine (LAMISIL) 250 MG tablet, Take 1 tablet (250 mg total) by mouth daily., Disp: 28 tablet, Rfl: 0   triamterene-hydrochlorothiazide (MAXZIDE-25) 37.5-25 MG tablet, Take 0.5 tablets by mouth daily., Disp: , Rfl:    acetaminophen (TYLENOL) 500 MG tablet, Take 1,000 mg by mouth every 6 (six) hours as needed for moderate pain or mild pain., Disp: , Rfl:    albuterol (PROVENTIL HFA;VENTOLIN HFA) 108 (90 Base) MCG/ACT inhaler, Inhale 2 puffs into the lungs every 4 (four) hours as needed for wheezing or shortness of breath., Disp: , Rfl:    aspirin EC 81 MG tablet, Take 1 tablet (81 mg total) by mouth daily. Swallow whole., Disp: 30 tablet, Rfl: 12  Laboratory examination:   Lab Results  Component Value Date   NA 136 07/06/2022   K 3.2 (L) 07/06/2022   CO2  25 07/06/2022   GLUCOSE 190 (H) 07/06/2022   BUN 16 07/06/2022   CREATININE 1.09 (H) 07/06/2022   CALCIUM 8.6 (L) 07/06/2022   EGFR 42 (L) 05/19/2022   GFRNONAA 52 (L) 07/06/2022       Latest Ref Rng & Units 07/06/2022    2:00 AM 07/05/2022    5:10 PM 07/05/2022    4:41 PM  CMP  Glucose 70 - 99 mg/dL 190     BUN 8 - 23 mg/dL 16     Creatinine 0.44 - 1.00 mg/dL 1.09     Sodium 135 - 145 mmol/L 136  134  135   Potassium 3.5 - 5.1 mmol/L 3.2  3.8  3.7   Chloride 98 - 111 mmol/L 101     CO2  22 - 32 mmol/L 25     Calcium 8.9 - 10.3 mg/dL 8.6         Latest Ref Rng & Units 07/06/2022    2:00 AM 07/05/2022    5:10 PM 07/05/2022    4:41 PM  CBC  WBC 4.0 - 10.5 K/uL 10.0     Hemoglobin 12.0 - 15.0 g/dL 9.7  9.9  8.2   Hematocrit 36.0 - 46.0 % 27.5  29.0  24.0   Platelets 150 - 400 K/uL 229       Lipid Panel Recent Labs    06/02/22 2359  CHOL 125  TRIG 64  LDLCALC 48  VLDL 13  HDL 64  CHOLHDL 2.0   Lab Results  Component Value Date   TSH 0.51 10/26/2020    External labs:   02/15/2022: Sodium 140, potassium 4.0, glucose 104, BUN 21, creatinine 1.24 EGFR 42 Hemoglobin 13.7, hematocrit 40.5, platelet count 244  08/30/2021: Hemoglobin A1c 6.0%  Radiology:   High-resolution CT scan of the chest 03/02/2022: 1. Cardiovascular: Normal heart size. No pericardial effusion. Mitral annular calcifications. Severe three-vessel coronary artery calcifications. Normal caliber thoracic aorta with severe calcified plaque.  2. Aortic Atherosclerosis and Emphysema  3. Part solid nodule of the right upper lobe measuring 18 mm in overall diameter with 4 mm solid component. Follow-up non-contrast CT recommended at 3-6 months to confirm persistence.  Additional bilateral solid pulmonary nodules are seen, largest measures 4 mm. Recommend attention on follow-up.  Cardiac Studies:   PCV MYOCARDIAL PERFUSION WITH LEXISCAN 05/22/2022  Lexiscan nuclear stress test performed using 1-day protocol. SPECT images show decreased tracer uptake in inferior myocardium, likely due to breast attenuation with imaging performed in the sitting position. Stress LVEF 65%.   PCV ECHOCARDIOGRAM COMPLETE 05/25/2022  Study Quality: Technically difficult study. Normal LV systolic function with visual EF 60-65%. Left ventricle cavity is normal in size. Normal left ventricular wall thickness. Normal global wall motion. Normal diastolic filling pattern, normal LAP. Trace tricuspid regurgitation. No evidence of  pulmonary hypertension. Small pericardial effusion. There is no hemodynamic significance. Compared to 10/17/2017: G2DD & elevated LAP are now normal, otherwise no significant change.  Carotid artery duplex 05/25/2022: Duplex suggests stenosis in the right internal carotid artery (1-15%). No evidence of significant stenosis in the right external carotid artery. Duplex suggests stenosis in the left internal carotid artery (1-15%). No evidence of significant stenosis in the left external carotid artery. Antegrade right vertebral artery flow. Antegrade left vertebral artery flow. Mild heterogeneous plaque noted bilaterally. Compared to the study done on 03/01/2020, left ICA stenosis of 50 to 49% not present.  Lower Extremity Arterial Duplex 05/25/2022: Diffuse calcific plaque throughout the bilateral lower extremity arteries.  Right CFA & PFA stenosis >50. Right distal SFA >50% stenosis.  Left CFA with severe dampened monophasic waveform suggests significant proximal vessel disease (Iliac artery).  Left CFA and Proximal SFA stenosis of >50%.  This exam reveals mildly decreased perfusion of the right lower extremity, noted at the post tibial artery level (ABI 0.81), severe dampened monophasic waveform pattern at the ankle.   This exam reveals moderately decreased perfusion of the left lower extremity, noted at the dorsalis pedis artery level (ABI 0.79), severe dampened monophasic waveform pattern at the ankle.  Bilateral baker's cyst noted.   Peripheral arteriogram 05/30/2022: Abdominal aortogram revealed presence of 2 renal arteries on either side, left renal artery has mild diffuse disease.  Right renal artery is widely patent.  The abdominal aorta is mildly tortuous, distal abdominal aorta has mild ectasia.  Aortoiliac bifurcation is widely patent.  Bilateral CIA and EIA are widely patent with mild disease. Left lower extremity: Left CFA has calcific 90% stenosis.  There is mild to moderate skipped  disease in the left mid and distal SFA.  No high-grade stenosis.  Lesions are about 50 to 60%.  Below the left knee, there is two-vessel runoff in the form of peroneal artery and posterior tibial artery.  AT is occluded. Right lower extremity: Right CFA is widely patent.  Mild disease is evident.  There are multiple tandem high-grade 80 to 90% focal stenosis in the right proximal, mid SFA.  Below the right knee there is only a single vessel runoff in the form of peroneal artery and this reconstitutes the AT at the level of the ankle.   Recommendation: Patient will need surgical left CFA endarterectomy.  Depending upon her symptoms, she may need right SFA atherectomy/balloon angioplasty/stenting at a later date.    Left Femoral endarterectomy 06/02/2022: Extensive plaque from the inguinal ligament down in to the SFA and profunda.  The patch extends onto the SFA for 1.5 cm.   Eversion endarterectomy of the profunda   ABI 06/16/2022: This exam reveals moderately decreased perfusion of the right lower extremity, noted at the dorsalis pedis and post tibial artery level (ABI 0.64) with markedly dampened monophasic waveform at the right ankle. This exam reveals mildly decreased perfusion of the left lower extremity, noted at the dorsalis pedis artery level (ABI 0.94) with mildly abnormal biphasic waveform pattern at the left ankle.  Compared to prior study 05/25/2022, further reduction in right ABI.  Left ABI has improved from 0.79, severely dampened monophasic waveform is now replaced by mildly abnormal biphasic waveforms and suggest successful left femoral endarterectomy and successful revascularization.  EKG:    Assessment     ICD-10-CM   1. PAD (peripheral artery disease) (HCC)  I73.9     2. Claudication ([place])  I73.9     3. H/O left femoral artery endarterectomy 06/02/2022  Z98.890     4. Primary hypertension  I10 CBC    Basic metabolic panel    5. Pure hypercholesterolemia  E78.00  Lipoprotein A (LPA)       Orders Placed This Encounter  Procedures   CBC   Basic metabolic panel   Lipoprotein A (LPA)    No orders of the defined types were placed in this encounter.   There are no discontinued medications.     Recommendations:   Donna [name] is a 77 y.o. female patient with hypertension, hyperlipidemia, degenerative joint disease and chronic back pain,  hyperglycemia, stage IIIa chronic kidney disease, prior tobacco use disorder with  approximately 20-pack-year history of smoking quit in 2000.   She presents here for follow-up of peripheral artery disease, peripheral arteriogram on 05/30/2022 and found to have complex and calcified high-grade/occlusion of the left CFA, underwent left femoral artery endarterectomy by Dr. Trula Slade on 06/02/2022  but developed seroma and infection and hematoma leading to replacement with saphenous vein patch and also placed on wound VAC and antibiotics due to Pseudomonas infection.    She called our office and an appointment as she has developed a sore on her interdigits between right fourth and fifth toe.  She was concerned about worsening PAD and also having significant amount of pain at night with leg cramps.  Left lower extremity is doing well.  No ulcerations.  With regard to postop she has healed well.       1. PAD (peripheral artery disease) (HCC) Patient's lesion reviewed, patient has a small,, I did probe the tunnel, there is no discharge, think that this is a healed up skin lesion and do not suspect acute limb ischemia.  I do not suspect she has any infection there as well.  2. Claudication [facility]) Patient has marked improvement in symptoms with medication involving the left lower extremity but still continues to have severe cramping and pain in the right leg, she wants to proceed with peripheral arteriogram.  She has multiple tandem lesions in the right SFA I will schedule her for peripheral arteriogram and possible  angioplasty.  3. H/O left femoral artery endarterectomy 06/02/2022 I reviewed the left femoral artery endarterectomy site, completely healed site.  She has excellent pulses in the lower extremity on the left.  4. Primary hypertension Blood pressure is elevated today however she is not mild distress from pain at the digit, again as noted above, there is no acute critical limb ischemia, capillary refill is certainly delayed.  The pain may be leading to elevated blood pressure, will continue to monitor this closely and adjust the medication once peripheral angiogram is performed. - CBC - Basic metabolic panel  5. Pure hypercholesterolemia She will need LPA checked as well.  I will see her back in 4 to 6 weeks for follow-up.     Adrian Prows, MD, [facility] 10/25/2022, 2:20 PM Office: [phone] Fax: [phone] Pager: [phone]

## 2022-10-26 DIAGNOSIS — M0579 Rheumatoid arthritis with rheumatoid factor of multiple sites without organ or systems involvement: Secondary | ICD-10-CM | POA: Diagnosis not present

## 2022-10-31 DIAGNOSIS — M50322 Other cervical disc degeneration at C5-C6 level: Secondary | ICD-10-CM | POA: Diagnosis not present

## 2022-10-31 DIAGNOSIS — M9901 Segmental and somatic dysfunction of cervical region: Secondary | ICD-10-CM | POA: Diagnosis not present

## 2022-11-03 ENCOUNTER — Encounter (HOSPITAL_COMMUNITY): Admission: RE | Payer: Self-pay | Source: Home / Self Care

## 2022-11-03 ENCOUNTER — Ambulatory Visit (HOSPITAL_COMMUNITY): Admission: RE | Admit: 2022-11-03 | Payer: Medicare Other | Source: Home / Self Care | Admitting: Cardiology

## 2022-11-03 SURGERY — ABDOMINAL AORTOGRAM W/LOWER EXTREMITY
Anesthesia: LOCAL

## 2022-11-09 DIAGNOSIS — J0100 Acute maxillary sinusitis, unspecified: Secondary | ICD-10-CM | POA: Diagnosis not present

## 2022-11-09 DIAGNOSIS — J40 Bronchitis, not specified as acute or chronic: Secondary | ICD-10-CM | POA: Diagnosis not present

## 2022-11-13 DIAGNOSIS — Z79899 Other long term (current) drug therapy: Secondary | ICD-10-CM | POA: Diagnosis not present

## 2022-11-13 DIAGNOSIS — N1831 Chronic kidney disease, stage 3a: Secondary | ICD-10-CM | POA: Diagnosis not present

## 2022-11-13 DIAGNOSIS — M0579 Rheumatoid arthritis with rheumatoid factor of multiple sites without organ or systems involvement: Secondary | ICD-10-CM | POA: Diagnosis not present

## 2022-11-13 DIAGNOSIS — G5601 Carpal tunnel syndrome, right upper limb: Secondary | ICD-10-CM | POA: Diagnosis not present

## 2022-11-13 DIAGNOSIS — G5600 Carpal tunnel syndrome, unspecified upper limb: Secondary | ICD-10-CM | POA: Diagnosis not present

## 2022-11-13 DIAGNOSIS — M79643 Pain in unspecified hand: Secondary | ICD-10-CM | POA: Diagnosis not present

## 2022-11-13 DIAGNOSIS — R768 Other specified abnormal immunological findings in serum: Secondary | ICD-10-CM | POA: Diagnosis not present

## 2022-11-13 DIAGNOSIS — M797 Fibromyalgia: Secondary | ICD-10-CM | POA: Diagnosis not present

## 2022-11-13 DIAGNOSIS — M1990 Unspecified osteoarthritis, unspecified site: Secondary | ICD-10-CM | POA: Diagnosis not present

## 2022-11-22 ENCOUNTER — Ambulatory Visit: Payer: Medicare Other | Admitting: Cardiology

## 2022-11-29 DIAGNOSIS — M0579 Rheumatoid arthritis with rheumatoid factor of multiple sites without organ or systems involvement: Secondary | ICD-10-CM | POA: Diagnosis not present

## 2022-12-04 DIAGNOSIS — N1832 Chronic kidney disease, stage 3b: Secondary | ICD-10-CM | POA: Diagnosis not present

## 2022-12-06 DIAGNOSIS — H269 Unspecified cataract: Secondary | ICD-10-CM | POA: Diagnosis not present

## 2022-12-06 DIAGNOSIS — H2512 Age-related nuclear cataract, left eye: Secondary | ICD-10-CM | POA: Diagnosis not present

## 2022-12-13 DIAGNOSIS — E673 Hypervitaminosis D: Secondary | ICD-10-CM | POA: Diagnosis not present

## 2022-12-13 DIAGNOSIS — I129 Hypertensive chronic kidney disease with stage 1 through stage 4 chronic kidney disease, or unspecified chronic kidney disease: Secondary | ICD-10-CM | POA: Diagnosis not present

## 2022-12-13 DIAGNOSIS — I739 Peripheral vascular disease, unspecified: Secondary | ICD-10-CM | POA: Diagnosis not present

## 2022-12-13 DIAGNOSIS — N1832 Chronic kidney disease, stage 3b: Secondary | ICD-10-CM | POA: Diagnosis not present

## 2022-12-21 ENCOUNTER — Ambulatory Visit: Payer: Medicare Other | Admitting: Internal Medicine

## 2022-12-26 ENCOUNTER — Ambulatory Visit: Payer: Medicare Other | Admitting: Internal Medicine

## 2022-12-28 DIAGNOSIS — M0579 Rheumatoid arthritis with rheumatoid factor of multiple sites without organ or systems involvement: Secondary | ICD-10-CM | POA: Diagnosis not present

## 2022-12-30 HISTORY — PX: CATARACT EXTRACTION: SUR2

## 2023-01-10 ENCOUNTER — Other Ambulatory Visit: Payer: Self-pay | Admitting: Internal Medicine

## 2023-01-10 DIAGNOSIS — R911 Solitary pulmonary nodule: Secondary | ICD-10-CM

## 2023-01-16 ENCOUNTER — Encounter: Payer: Self-pay | Admitting: Cardiology

## 2023-01-16 ENCOUNTER — Ambulatory Visit: Payer: Medicare Other | Admitting: Cardiology

## 2023-01-16 VITALS — BP 144/69 | HR 81 | Ht 66.0 in | Wt 161.0 lb

## 2023-01-16 DIAGNOSIS — I739 Peripheral vascular disease, unspecified: Secondary | ICD-10-CM | POA: Diagnosis not present

## 2023-01-16 DIAGNOSIS — Z9889 Other specified postprocedural states: Secondary | ICD-10-CM

## 2023-01-16 DIAGNOSIS — I10 Essential (primary) hypertension: Secondary | ICD-10-CM | POA: Diagnosis not present

## 2023-01-16 DIAGNOSIS — E7800 Pure hypercholesterolemia, unspecified: Secondary | ICD-10-CM

## 2023-01-16 MED ORDER — AMLODIPINE BESYLATE 10 MG PO TABS
10.0000 mg | ORAL_TABLET | Freq: Every day | ORAL | 3 refills | Status: DC
Start: 2023-01-16 — End: 2024-05-02

## 2023-01-16 MED ORDER — AMLODIPINE BESYLATE 10 MG PO TABS: 5.0000 mg | ORAL_TABLET | Freq: Every day | ORAL | 3 refills | Status: DC

## 2023-01-16 NOTE — Progress Notes (Signed)
Primary Physician/Referring:  Merri Brunette, MD  Patient ID: Donna [name], female    DOB: 01/11/1946, 77 y.o.   MRN: [id number]  Chief Complaint  Patient presents with   PAD   Follow-up   Hypertension   Hyperlipidemia   HPI:    Donna [name]  is a 77 y.o. ffemale patient with hypertension, hyperlipidemia, degenerative joint disease and chronic back pain,  hyperglycemia, stage IIIa chronic kidney disease, prior tobacco use disorder with approximately 20-pack-year history of smoking quit in 2000.  She underwent left femoral endarterectomy on 06/02/2022 complicated by seroma and eventually healed after replacement with a saphenous vein patch and prolonged antibiotics due to Pseudomonas infection.  She presents for 78-month follow-up, she was previously scheduled for peripheral arteriogram due to symptoms claudication however it had to be changed to scheduling.  Right little toe ulceration that she thought was an ulcer and has now completely healed.  It was just a small skin tag.  States that she is unable to do on routine activities without having to stop due to severe right leg pain.  No rest pain.  No open wounds or ulcerations.  She would like to proceed with peripheral arteriogram.  Otherwise denies chest pain, dyspnea remained stable.  No PND or orthopnea.  Past Medical History:  Diagnosis Date   Arthritis    lumbar, toes, L knee, cerv. spine & hands  - rheumatoid   Cancer (HCC)    basal cell removed fr. 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 Gillermina Phy, but prev. saw Dr. Jacinto Halim   Peripheral vascular disease  [facility])    Seasonal allergies    Past Surgical History:  Procedure Laterality Date   ABDOMINAL AORTOGRAM W/LOWER EXTREMITY N/A 05/30/2022   Procedure: ABDOMINAL AORTOGRAM W/LOWER EXTREMITY;  Surgeon: Yates Decamp, MD;  Location: [facility];  Service: Cardiovascular;  Laterality: N/A;   APPLICATION OF WOUND VAC Left 07/05/2022   Procedure: APPLICATION OF WOUND VAC;  Surgeon: Nada Libman, MD;  Location: [facility] OR;  Service: Vascular;  Laterality: Left;   APPLICATION OF WOUND VAC Left 07/05/2022   Procedure: APPLICATION OF WOUND VAC;  Surgeon: Nada Libman, MD;  Location: [facility] OR;  Service: Vascular;  Laterality: Left;   BACK SURGERY  07/31/1992   cerv. spine fusion    BREAST ENHANCEMENT SURGERY     BREAST SURGERY  04/01/1979   implants- bilateral    CATARACT EXTRACTION Left 12/2022   CERVICAL SPINE SURGERY  07/31/1990   fusion   ENDARTERECTOMY FEMORAL Left 06/02/2022   Procedure: LEFT ENDARTERECTOMY FEMORAL with BOVINE PATCH;  Surgeon: Nada Libman, MD;  Location: [facility] OR;  Service: Vascular;  Laterality: Left;   GROIN DEBRIDEMENT Left 07/05/2022   Procedure: LEFT Marden Noble WITH STIMULAN ANTIBIOTIC BEAD AND KERECIS FISH SKIN GRAFT APPLICATION;  Surgeon: Nada Libman, MD;  Location: [facility] OR;  Service: Vascular;  Laterality: Left;   INCISION AND DRAINAGE Left 07/05/2022   Procedure: LEFT Marden Noble;  Surgeon:  Nada Libman, MD;  Location: [facility] OR;  Service: Vascular;  Laterality: Left;   JOINT REPLACEMENT Right 07/31/2005   LUMBAR LAMINECTOMY/DECOMPRESSION MICRODISCECTOMY Left 10/07/2015   Procedure: Lumbar three-five Decompressive lumbar Laminectomy & Left Lumbar four-five Microdiscectomy;  Surgeon: Shirlean Kelly, MD;  Location: [facility];  Service: Neurosurgery;  Laterality: Left;   LUMBAR SPINE SURGERY  2016   PATCH ANGIOPLASTY Left 07/05/2022   Procedure: VEIN PATCH ANGIOPLASTY;  Surgeon: Nada Libman, MD;  Location: [facility] OR;  Service: Vascular;  Laterality:  Left;   TONSILLECTOMY     TOTAL HIP ARTHROPLASTY  07/31/2005   Right   TUBAL LIGATION     Family History  Problem Relation Age of Onset   Kidney cancer Mother    Lung cancer Sister    Heart disease Maternal Aunt    Heart disease Maternal Aunt    Kidney cancer Maternal Aunt    Kidney cancer Maternal Uncle     Social History   Tobacco Use   Smoking status: Former    Packs/day: 0.70    Years: 24.00    Additional pack years: 0.00    Total pack years: 16.80    Types: Cigarettes    Quit date: 07/31/2009    Years since quitting: 13.4   Smokeless tobacco: Former    Quit date: 10/01/1998  Substance Use Topics   Alcohol use: Yes    Comment: wine- rarely   Marital Status: Married  ROS  Review of Systems  Cardiovascular:  Positive for claudication, cyanosis and leg swelling. Negative for chest pain, dyspnea on exertion, paroxysmal nocturnal dyspnea and syncope.   Objective   Review of Systems  Cardiovascular:  Positive for claudication (right leg). Negative for chest pain, dyspnea on exertion and leg swelling.     Blood pressure (!) 144/69, pulse 81, height 5\' 6"  (1.676 m), weight 161 lb (73 kg), SpO2 95 %. Body mass index is 25.99 kg/m.     01/16/2023    8:53 AM 10/25/2022    1:23 PM 08/28/2022    2:49 PM  Vitals with BMI  Height 5\' 6"  5\' 6"  5\' 6"   Weight 161 lbs 157 lbs 152 lbs  BMI 26 25.35 24.55  Systolic 144 149 629  Diastolic 69 76 76  Pulse 81 70 85    Physical Exam Neck:     Vascular: Carotid bruit (bilateral) present. No JVD.  Cardiovascular:     Rate and Rhythm: Normal rate and regular rhythm.     Pulses:          Radial pulses are 2+ on the right side and 2+ on the left side.       Femoral pulses are 1+ on the right side and 1+ on the left side.      Popliteal pulses are 0 on the right side and 0 on the left side.       Dorsalis pedis pulses are 0 on the right side and 2+ on the left side.       Posterior tibial pulses are 0 on the right side and 0 on the  left side.     Heart sounds: Normal heart sounds. No murmur heard.    No gallop.     Comments: Right leg colder, capillary refill >3 Sec Pulmonary:     Effort: Pulmonary effort is normal.     Breath sounds: Normal breath sounds. No wheezing or rales.  Abdominal:     General: Bowel sounds are normal.  Palpations: Abdomen is soft.  Musculoskeletal:     Right lower leg: No edema.     Left lower leg: No edema.  Feet:     Right foot:     Skin integrity: Skin breakdown and warmth present.     Left foot:     Skin integrity: Skin breakdown, erythema and warmth present.     Comments: Right fifth toe erythematous wound with purulent drainage. Discoloration noted to bilateral toes.   Laboratory examination:   Lab Results  Component Value Date   NA 139 01/16/2023   K 4.4 01/16/2023   CO2 22 01/16/2023   GLUCOSE 95 01/16/2023   BUN 20 01/16/2023   CREATININE 1.30 (H) 01/16/2023   CALCIUM 9.7 01/16/2023   EGFR 42 (L) 01/16/2023   GFRNONAA 52 (L) 07/06/2022       Latest Ref Rng & Units 01/16/2023   10:01 AM 07/06/2022    2:00 AM 07/05/2022    5:10 PM  CMP  Glucose 70 - 99 mg/dL 95  960    BUN 8 - 27 mg/dL 20  16    Creatinine 4.54 - 1.00 mg/dL 0.98  1.19    Sodium 147 - 144 mmol/L 139  136  134   Potassium 3.5 - 5.2 mmol/L 4.4  3.2  3.8   Chloride 96 - 106 mmol/L 101  101    CO2 20 - 29 mmol/L 22  25    Calcium 8.7 - 10.3 mg/dL 9.7  8.6        Latest Ref Rng & Units 01/16/2023   10:01 AM 07/06/2022    2:00 AM 07/05/2022    5:10 PM  CBC  WBC 3.4 - 10.8 x10E3/uL 4.7  10.0    Hemoglobin 11.1 - 15.9 g/dL 82.9  9.7  9.9   Hematocrit 34.0 - 46.6 % 38.5  27.5  29.0   Platelets 150 - 450 x10E3/uL 218  229      Lipid Panel Recent Labs    06/02/22 2359  CHOL 125  TRIG 64  LDLCALC 48  VLDL 13  HDL 64  CHOLHDL 2.0   Lab Results  Component Value Date   TSH 0.51 10/26/2020    External labs:   02/15/2022: Sodium 140, potassium 4.0, glucose 104, BUN 21, creatinine 1.24  EGFR 42 Hemoglobin 13.7, hematocrit 40.5, platelet count 244  08/30/2021: Hemoglobin A1c 6.0%  Radiology:   High-resolution CT scan of the chest 03/02/2022: 1. Cardiovascular: Normal heart size. No pericardial effusion. Mitral annular calcifications. Severe three-vessel coronary artery calcifications. Normal caliber thoracic aorta with severe calcified plaque.  2. Aortic Atherosclerosis and Emphysema  3. Part solid nodule of the right upper lobe measuring 18 mm in overall diameter with 4 mm solid component. Follow-up non-contrast CT recommended at 3-6 months to confirm persistence.  Additional bilateral solid pulmonary nodules are seen, largest measures 4 mm. Recommend attention on follow-up.  Cardiac Studies:   PCV MYOCARDIAL PERFUSION WITH LEXISCAN 05/22/2022  Lexiscan nuclear stress test performed using 1-day protocol. SPECT images show decreased tracer uptake in inferior myocardium, likely due to breast attenuation with imaging performed in the sitting position. Stress LVEF 65%.   PCV ECHOCARDIOGRAM COMPLETE 05/25/2022  Study Quality: Technically difficult study. Normal LV systolic function with visual EF 60-65%. Left ventricle cavity is normal in size. Normal left ventricular wall thickness. Normal global wall motion. Normal diastolic filling pattern, normal LAP. Trace tricuspid regurgitation. No evidence of pulmonary hypertension. Small pericardial effusion. There is no hemodynamic significance.  Compared to 10/17/2017: G2DD & elevated LAP are now normal, otherwise no significant change.  Carotid artery duplex 05/25/2022: Duplex suggests stenosis in the right internal carotid artery (1-15%). No evidence of significant stenosis in the right external carotid artery. Duplex suggests stenosis in the left internal carotid artery (1-15%). No evidence of significant stenosis in the left external carotid artery. Antegrade right vertebral artery flow. Antegrade left vertebral artery  flow. Mild heterogeneous plaque noted bilaterally. Compared to the study done on 03/01/2020, left ICA stenosis of 50 to 49% not present.  Lower Extremity Arterial Duplex 05/25/2022: Diffuse calcific plaque throughout the bilateral lower extremity arteries.  Right CFA & PFA stenosis >50. Right distal SFA >50% stenosis.  Left CFA with severe dampened monophasic waveform suggests significant proximal vessel disease (Iliac artery).  Left CFA and Proximal SFA stenosis of >50%.  This exam reveals mildly decreased perfusion of the right lower extremity, noted at the post tibial artery level (ABI 0.81), severe dampened monophasic waveform pattern at the ankle.   This exam reveals moderately decreased perfusion of the left lower extremity, noted at the dorsalis pedis artery level (ABI 0.79), severe dampened monophasic waveform pattern at the ankle.  Bilateral baker's cyst noted.   Peripheral arteriogram 05/30/2022: Abdominal aortogram revealed presence of 2 renal arteries on either side, left renal artery has mild diffuse disease.  Right renal artery is widely patent.  The abdominal aorta is mildly tortuous, distal abdominal aorta has mild ectasia.  Aortoiliac bifurcation is widely patent.  Bilateral CIA and EIA are widely patent with mild disease. Left lower extremity: Left CFA has calcific 90% stenosis.  There is mild to moderate skipped disease in the left mid and distal SFA.  No high-grade stenosis.  Lesions are about 50 to 60%.  Below the left knee, there is two-vessel runoff in the form of peroneal artery and posterior tibial artery.  AT is occluded. Right lower extremity: Right CFA is widely patent.  Mild disease is evident.  There are multiple tandem high-grade 80 to 90% focal stenosis in the right proximal, mid SFA.  Below the right knee there is only a single vessel runoff in the form of peroneal artery and this reconstitutes the AT at the level of the ankle.   Recommendation: Patient will need  surgical left CFA endarterectomy.  Depending upon her symptoms, she may need right SFA atherectomy/balloon angioplasty/stenting at a later date.    Left Femoral endarterectomy 06/02/2022: Extensive plaque from the inguinal ligament down in to the SFA and profunda.  The patch extends onto the SFA for 1.5 cm.   Eversion endarterectomy of the profunda   ABI 06/16/2022: This exam reveals moderately decreased perfusion of the right lower extremity, noted at the dorsalis pedis and post tibial artery level (ABI 0.64) with markedly dampened monophasic waveform at the right ankle. This exam reveals mildly decreased perfusion of the left lower extremity, noted at the dorsalis pedis artery level (ABI 0.94) with mildly abnormal biphasic waveform pattern at the left ankle.  Compared to prior study 05/25/2022, further reduction in right ABI.  Left ABI has improved from 0.79, severely dampened monophasic waveform is now replaced by mildly abnormal biphasic waveforms and suggest successful left femoral endarterectomy and successful revascularization.  EKG:   EKG 01/16/2023: Normal sinus rhythm at rate of 73 bpm, normal axis, no evidence of ischemia, normal EKG.   Medications and allergies   Allergies  Allergen Reactions   Thorazine [Chlorpromazine] Anaphylaxis    Current Outpatient Medications:  bisoprolol (ZEBETA) 5 MG tablet, TAKE 1 TABLET BY MOUTH DAILY, Disp: 90 tablet, Rfl: 1   BREZTRI AEROSPHERE 160-9-4.8 MCG/ACT AERO, INHALE 2 INHALATIONS BY MOUTH  INTO THE LUNGS IN THE MORNING  AND AT BEDTIME, Disp: 32.1 g, Rfl: 3   buPROPion (WELLBUTRIN XL) 150 MG 24 hr tablet, Take 150 mg by mouth daily. Take with 300 mg to = 450 mg daily, Disp: , Rfl:    buPROPion (WELLBUTRIN XL) 300 MG 24 hr tablet, Take 300 mg by mouth daily. Take with the 150 to = 450 mg daily, Disp: , Rfl:    certolizumab pegol (CIMZIA) 2 X 200 MG KIT, Inject into the skin., Disp: , Rfl:    clindamycin (CLINDAGEL) 1 % gel, Apply  topically daily., Disp: 30 g, Rfl: 0   clopidogrel (PLAVIX) 75 MG tablet, Take 75 mg by mouth daily., Disp: , Rfl:    co-enzyme Q-10 30 MG capsule, Take 100 mg by mouth daily., Disp: , Rfl:    famotidine (PEPCID) 20 MG tablet, Take 20 mg by mouth daily., Disp: , Rfl:    HYDROcodone-acetaminophen (NORCO/VICODIN) 5-325 MG tablet, Take 1 tablet by mouth every 6 (six) hours as needed for moderate pain., Disp: 12 tablet, Rfl: 0   hydroxychloroquine (PLAQUENIL) 200 MG tablet, Take 200 mg by mouth daily., Disp: , Rfl:    ipratropium (ATROVENT) 0.06 % nasal spray, Place 1 spray into both nostrils 3 (three) times daily., Disp: , Rfl:    ketoconazole (NIZORAL) 2 % cream, Apply 1 Application topically daily., Disp: 30 g, Rfl: 2   leflunomide (ARAVA) 20 MG tablet, Take 20 mg by mouth daily., Disp: , Rfl:    Multiple Vitamins-Minerals (MULTIVITAMIN WOMEN) TABS, Take 1 tablet by mouth daily., Disp: , Rfl:    rosuvastatin (CRESTOR) 5 MG tablet, Take 5 mg by mouth daily., Disp: , Rfl:    acetaminophen (TYLENOL) 500 MG tablet, Take 1,000 mg by mouth every 6 (six) hours as needed for moderate pain or mild pain., Disp: , Rfl:    albuterol (PROVENTIL HFA;VENTOLIN HFA) 108 (90 Base) MCG/ACT inhaler, Inhale 2 puffs into the lungs every 4 (four) hours as needed for wheezing or shortness of breath., Disp: , Rfl:    amLODipine (NORVASC) 10 MG tablet, Take 1 tablet (10 mg total) by mouth daily., Disp: 90 tablet, Rfl: 3   aspirin EC 81 MG tablet, Take 1 tablet (81 mg total) by mouth daily. Swallow whole., Disp: 30 tablet, Rfl: 12   Assessment     ICD-10-CM   1. PAD (peripheral artery disease) (HCC)  I73.9 EKG 12-Lead    2. H/O left femoral artery endarterectomy 06/02/2022  Z98.890     3. Primary hypertension  I10 amLODipine (NORVASC) 10 MG tablet    DISCONTINUED: amLODipine (NORVASC) 10 MG tablet    4. Pure hypercholesterolemia  E78.00        Orders Placed This Encounter  Procedures   EKG 12-Lead    Meds  ordered this encounter  Medications   DISCONTD: amLODipine (NORVASC) 10 MG tablet    Sig: Take 0.5 tablets (5 mg total) by mouth daily.    Dispense:  90 tablet    Refill:  3   amLODipine (NORVASC) 10 MG tablet    Sig: Take 1 tablet (10 mg total) by mouth daily.    Dispense:  90 tablet    Refill:  3    Please disregard 5 mg Rx.  Please discontinue Maxide.   Medications Discontinued During This  Encounter  Medication Reason   terbinafine (LAMISIL) 250 MG tablet Completed Course   trimethoprim-polymyxin b (POLYTRIM) ophthalmic solution Completed Course   DULoxetine (CYMBALTA) 20 MG capsule Completed Course   amLODipine (NORVASC) 5 MG tablet Reorder   triamterene-hydrochlorothiazide (MAXZIDE-25) 37.5-25 MG tablet Patient Preference   amLODipine (NORVASC) 10 MG tablet Reorder   Recommendations:   Donna [name] is a 77 y.o. female patient with hypertension, hyperlipidemia, degenerative joint disease and chronic back pain,  hyperglycemia, stage IIIa chronic kidney disease, prior tobacco use disorder with approximately 20-pack-year history of smoking quit in 2000.  She underwent left femoral endarterectomy on 06/02/2022 complicated by seroma and eventually healed after replacement with a saphenous vein patch and prolonged antibiotics due to Pseudomonas infection.   1. PAD (peripheral artery disease) (HCC) Patient with lifestyle limiting claudication, also she has decreased pulse in the right and her right leg is also cold, even minimal activity brings on symptoms of claudication involving the entire right leg.  She wants to proceed with peripheral arteriogram.  I had already previously discussed in detail regarding the risks and benefits and complications, she is aware of this. - EKG 12-Lead  2. H/O left femoral artery endarterectomy 06/02/2022 Left groin site has now completely healed.  3. Primary hypertension Blood pressure is elevated today however I want to increase the dose of the  Maxide, patient states that it is making her have frequency of urination and would like to discontinue.  Will discontinue Maxide and increase the dose of amlodipine to 10 mg daily. - amLODipine (NORVASC) 10 MG tablet; Take 0.5 tablets (5 mg total) by mouth daily.  Dispense: 90 tablet; Refill: 3  4. Pure hypercholesterolemia Patient's lipids are well-controlled.  Her Lp(a) is pending today.  Will obtain Lp(a) along with CBC and BMP for preprocedure labs.  I will see her back in 6 weeks for follow-up after the peripheral arteriogram and angioplasty to the right lower extremity possibly.     Yates Decamp, MD, [facility] 01/17/2023, 6:22 AM Office: [phone] Fax: [phone] Pager: [phone]

## 2023-01-16 NOTE — H&P (View-Only) (Signed)
 Primary Physician/Referring:  Pharr, Walter, MD  Patient ID: Donna [name], female    DOB: 12/30/1945, 76 y.o.   MRN: [id number]  Chief Complaint  Patient presents with   PAD   Follow-up   Hypertension   Hyperlipidemia   HPI:    Donna [name]  is a 76 y.o. ffemale patient with hypertension, hyperlipidemia, degenerative joint disease and chronic back pain,  hyperglycemia, stage IIIa chronic kidney disease, prior tobacco use disorder with approximately 20-pack-year history of smoking quit in 2000.  She underwent left femoral endarterectomy on 06/02/2022 complicated by seroma and eventually healed after replacement with a saphenous vein patch and prolonged antibiotics due to Pseudomonas infection.  She presents for 3-month follow-up, she was previously scheduled for peripheral arteriogram due to symptoms claudication however it had to be changed to scheduling.  Right little toe ulceration that she thought was an ulcer and has now completely healed.  It was just a small skin tag.  States that she is unable to do on routine activities without having to stop due to severe right leg pain.  No rest pain.  No open wounds or ulcerations.  She would like to proceed with peripheral arteriogram.  Otherwise denies chest pain, dyspnea remained stable.  No PND or orthopnea.  Past Medical History:  Diagnosis Date   Arthritis    lumbar, toes, L knee, cerv. spine & hands  - rheumatoid   Cancer (HCC)    basal cell removed fr. 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 B. McKenzie, but prev. saw Dr. Maleke Feria   Peripheral vascular disease  (HCC)    Seasonal allergies    Past Surgical History:  Procedure Laterality Date   ABDOMINAL AORTOGRAM W/LOWER EXTREMITY N/A 05/30/2022   Procedure: ABDOMINAL AORTOGRAM W/LOWER EXTREMITY;  Surgeon: Yameli Delamater, MD;  Location: [facility];  Service: Cardiovascular;  Laterality: N/A;   APPLICATION OF WOUND VAC Left 07/05/2022   Procedure: APPLICATION OF WOUND VAC;  Surgeon: Brabham, Vance W, MD;  Location: [facility];  Service: Vascular;  Laterality: Left;   APPLICATION OF WOUND VAC Left 07/05/2022   Procedure: APPLICATION OF WOUND VAC;  Surgeon: Brabham, Vance W, MD;  Location: [facility];  Service: Vascular;  Laterality: Left;   BACK SURGERY  07/31/1992   cerv. spine fusion    BREAST ENHANCEMENT SURGERY     BREAST SURGERY  04/01/1979   implants- bilateral    CATARACT EXTRACTION Left 12/2022   CERVICAL SPINE SURGERY  07/31/1990   fusion   ENDARTERECTOMY FEMORAL Left 06/02/2022   Procedure: LEFT ENDARTERECTOMY FEMORAL with BOVINE PATCH;  Surgeon: Brabham, Vance W, MD;  Location: [facility];  Service: Vascular;  Laterality: Left;   GROIN DEBRIDEMENT Left 07/05/2022   Procedure: LEFT GROIN WASHOUT WITH STIMULAN ANTIBIOTIC BEAD AND KERECIS FISH SKIN GRAFT APPLICATION;  Surgeon: Brabham, Vance W, MD;  Location: [facility];  Service: Vascular;  Laterality: Left;   INCISION AND DRAINAGE Left 07/05/2022   Procedure: LEFT GROIN WASHOUT;  Surgeon:   Brabham, Vance W, MD;  Location: [facility];  Service: Vascular;  Laterality: Left;   JOINT REPLACEMENT Right 07/31/2005   LUMBAR LAMINECTOMY/DECOMPRESSION MICRODISCECTOMY Left 10/07/2015   Procedure: Lumbar three-five Decompressive lumbar Laminectomy & Left Lumbar four-five Microdiscectomy;  Surgeon: Robert Nudelman, MD;  Location: [facility];  Service: Neurosurgery;  Laterality: Left;   LUMBAR SPINE SURGERY  2016   PATCH ANGIOPLASTY Left 07/05/2022   Procedure: VEIN PATCH ANGIOPLASTY;  Surgeon: Brabham, Vance W, MD;  Location: [facility];  Service: Vascular;  Laterality:  Left;   TONSILLECTOMY     TOTAL HIP ARTHROPLASTY  07/31/2005   Right   TUBAL LIGATION     Family History  Problem Relation Age of Onset   Kidney cancer Mother    Lung cancer Sister    Heart disease Maternal Aunt    Heart disease Maternal Aunt    Kidney cancer Maternal Aunt    Kidney cancer Maternal Uncle     Social History   Tobacco Use   Smoking status: Former    Packs/day: 0.70    Years: 24.00    Additional pack years: 0.00    Total pack years: 16.80    Types: Cigarettes    Quit date: 07/31/2009    Years since quitting: 13.4   Smokeless tobacco: Former    Quit date: 10/01/1998  Substance Use Topics   Alcohol use: Yes    Comment: wine- rarely   Marital Status: Married  ROS  Review of Systems  Cardiovascular:  Positive for claudication, cyanosis and leg swelling. Negative for chest pain, dyspnea on exertion, paroxysmal nocturnal dyspnea and syncope.   Objective   Review of Systems  Cardiovascular:  Positive for claudication (right leg). Negative for chest pain, dyspnea on exertion and leg swelling.     Blood pressure (!) 144/69, pulse 81, height 5' 6" (1.676 m), weight 161 lb (73 kg), SpO2 95 %. Body mass index is 25.99 kg/m.     01/16/2023    8:53 AM 10/25/2022    1:23 PM 08/28/2022    2:49 PM  Vitals with BMI  Height 5' 6" 5' 6" 5' 6"  Weight 161 lbs 157 lbs 152 lbs  BMI 26 25.35 24.55  Systolic 144 149 120  Diastolic 69 76 76  Pulse 81 70 85    Physical Exam Neck:     Vascular: Carotid bruit (bilateral) present. No JVD.  Cardiovascular:     Rate and Rhythm: Normal rate and regular rhythm.     Pulses:          Radial pulses are 2+ on the right side and 2+ on the left side.       Femoral pulses are 1+ on the right side and 1+ on the left side.      Popliteal pulses are 0 on the right side and 0 on the left side.       Dorsalis pedis pulses are 0 on the right side and 2+ on the left side.       Posterior tibial pulses are 0 on the right side and 0 on the  left side.     Heart sounds: Normal heart sounds. No murmur heard.    No gallop.     Comments: Right leg colder, capillary refill >3 Sec Pulmonary:     Effort: Pulmonary effort is normal.     Breath sounds: Normal breath sounds. No wheezing or rales.  Abdominal:     General: Bowel sounds are normal.       Palpations: Abdomen is soft.  Musculoskeletal:     Right lower leg: No edema.     Left lower leg: No edema.  Feet:     Right foot:     Skin integrity: Skin breakdown and warmth present.     Left foot:     Skin integrity: Skin breakdown, erythema and warmth present.     Comments: Right fifth toe erythematous wound with purulent drainage. Discoloration noted to bilateral toes.   Laboratory examination:   Lab Results  Component Value Date   NA 139 01/16/2023   K 4.4 01/16/2023   CO2 22 01/16/2023   GLUCOSE 95 01/16/2023   BUN 20 01/16/2023   CREATININE 1.30 (H) 01/16/2023   CALCIUM 9.7 01/16/2023   EGFR 42 (L) 01/16/2023   GFRNONAA 52 (L) 07/06/2022       Latest Ref Rng & Units 01/16/2023   10:01 AM 07/06/2022    2:00 AM 07/05/2022    5:10 PM  CMP  Glucose 70 - 99 mg/dL 95  190    BUN 8 - 27 mg/dL 20  16    Creatinine 0.57 - 1.00 mg/dL 1.30  1.09    Sodium 134 - 144 mmol/L 139  136  134   Potassium 3.5 - 5.2 mmol/L 4.4  3.2  3.8   Chloride 96 - 106 mmol/L 101  101    CO2 20 - 29 mmol/L 22  25    Calcium 8.7 - 10.3 mg/dL 9.7  8.6        Latest Ref Rng & Units 01/16/2023   10:01 AM 07/06/2022    2:00 AM 07/05/2022    5:10 PM  CBC  WBC 3.4 - 10.8 x10E3/uL 4.7  10.0    Hemoglobin 11.1 - 15.9 g/dL 12.9  9.7  9.9   Hematocrit 34.0 - 46.6 % 38.5  27.5  29.0   Platelets 150 - 450 x10E3/uL 218  229      Lipid Panel Recent Labs    06/02/22 2359  CHOL 125  TRIG 64  LDLCALC 48  VLDL 13  HDL 64  CHOLHDL 2.0   Lab Results  Component Value Date   TSH 0.51 10/26/2020    External labs:   02/15/2022: Sodium 140, potassium 4.0, glucose 104, BUN 21, creatinine 1.24  EGFR 42 Hemoglobin 13.7, hematocrit 40.5, platelet count 244  08/30/2021: Hemoglobin A1c 6.0%  Radiology:   High-resolution CT scan of the chest 03/02/2022: 1. Cardiovascular: Normal heart size. No pericardial effusion. Mitral annular calcifications. Severe three-vessel coronary artery calcifications. Normal caliber thoracic aorta with severe calcified plaque.  2. Aortic Atherosclerosis and Emphysema  3. Part solid nodule of the right upper lobe measuring 18 mm in overall diameter with 4 mm solid component. Follow-up non-contrast CT recommended at 3-6 months to confirm persistence.  Additional bilateral solid pulmonary nodules are seen, largest measures 4 mm. Recommend attention on follow-up.  Cardiac Studies:   PCV MYOCARDIAL PERFUSION WITH LEXISCAN 05/22/2022  Lexiscan nuclear stress test performed using 1-day protocol. SPECT images show decreased tracer uptake in inferior myocardium, likely due to breast attenuation with imaging performed in the sitting position. Stress LVEF 65%.   PCV ECHOCARDIOGRAM COMPLETE 05/25/2022  Study Quality: Technically difficult study. Normal LV systolic function with visual EF 60-65%. Left ventricle cavity is normal in size. Normal left ventricular wall thickness. Normal global wall motion. Normal diastolic filling pattern, normal LAP. Trace tricuspid regurgitation. No evidence of pulmonary hypertension. Small pericardial effusion. There is no hemodynamic significance.   Compared to 10/17/2017: G2DD & elevated LAP are now normal, otherwise no significant change.  Carotid artery duplex 05/25/2022: Duplex suggests stenosis in the right internal carotid artery (1-15%). No evidence of significant stenosis in the right external carotid artery. Duplex suggests stenosis in the left internal carotid artery (1-15%). No evidence of significant stenosis in the left external carotid artery. Antegrade right vertebral artery flow. Antegrade left vertebral artery  flow. Mild heterogeneous plaque noted bilaterally. Compared to the study done on 03/01/2020, left ICA stenosis of 50 to 49% not present.  Lower Extremity Arterial Duplex 05/25/2022: Diffuse calcific plaque throughout the bilateral lower extremity arteries.  Right CFA & PFA stenosis >50. Right distal SFA >50% stenosis.  Left CFA with severe dampened monophasic waveform suggests significant proximal vessel disease (Iliac artery).  Left CFA and Proximal SFA stenosis of >50%.  This exam reveals mildly decreased perfusion of the right lower extremity, noted at the post tibial artery level (ABI 0.81), severe dampened monophasic waveform pattern at the ankle.   This exam reveals moderately decreased perfusion of the left lower extremity, noted at the dorsalis pedis artery level (ABI 0.79), severe dampened monophasic waveform pattern at the ankle.  Bilateral baker's cyst noted.   Peripheral arteriogram 05/30/2022: Abdominal aortogram revealed presence of 2 renal arteries on either side, left renal artery has mild diffuse disease.  Right renal artery is widely patent.  The abdominal aorta is mildly tortuous, distal abdominal aorta has mild ectasia.  Aortoiliac bifurcation is widely patent.  Bilateral CIA and EIA are widely patent with mild disease. Left lower extremity: Left CFA has calcific 90% stenosis.  There is mild to moderate skipped disease in the left mid and distal SFA.  No high-grade stenosis.  Lesions are about 50 to 60%.  Below the left knee, there is two-vessel runoff in the form of peroneal artery and posterior tibial artery.  AT is occluded. Right lower extremity: Right CFA is widely patent.  Mild disease is evident.  There are multiple tandem high-grade 80 to 90% focal stenosis in the right proximal, mid SFA.  Below the right knee there is only a single vessel runoff in the form of peroneal artery and this reconstitutes the AT at the level of the ankle.   Recommendation: Patient will need  surgical left CFA endarterectomy.  Depending upon her symptoms, she may need right SFA atherectomy/balloon angioplasty/stenting at a later date.    Left Femoral endarterectomy 06/02/2022: Extensive plaque from the inguinal ligament down in to the SFA and profunda.  The patch extends onto the SFA for 1.5 cm.   Eversion endarterectomy of the profunda   ABI 06/16/2022: This exam reveals moderately decreased perfusion of the right lower extremity, noted at the dorsalis pedis and post tibial artery level (ABI 0.64) with markedly dampened monophasic waveform at the right ankle. This exam reveals mildly decreased perfusion of the left lower extremity, noted at the dorsalis pedis artery level (ABI 0.94) with mildly abnormal biphasic waveform pattern at the left ankle.  Compared to prior study 05/25/2022, further reduction in right ABI.  Left ABI has improved from 0.79, severely dampened monophasic waveform is now replaced by mildly abnormal biphasic waveforms and suggest successful left femoral endarterectomy and successful revascularization.  EKG:   EKG 01/16/2023: Normal sinus rhythm at rate of 73 bpm, normal axis, no evidence of ischemia, normal EKG.   Medications and allergies   Allergies  Allergen Reactions   Thorazine [Chlorpromazine] Anaphylaxis    Current Outpatient Medications:      bisoprolol (ZEBETA) 5 MG tablet, TAKE 1 TABLET BY MOUTH DAILY, Disp: 90 tablet, Rfl: 1   BREZTRI AEROSPHERE 160-9-4.8 MCG/ACT AERO, INHALE 2 INHALATIONS BY MOUTH  INTO THE LUNGS IN THE MORNING  AND AT BEDTIME, Disp: 32.1 g, Rfl: 3   buPROPion (WELLBUTRIN XL) 150 MG 24 hr tablet, Take 150 mg by mouth daily. Take with 300 mg to = 450 mg daily, Disp: , Rfl:    buPROPion (WELLBUTRIN XL) 300 MG 24 hr tablet, Take 300 mg by mouth daily. Take with the 150 to = 450 mg daily, Disp: , Rfl:    certolizumab pegol (CIMZIA) 2 X 200 MG KIT, Inject into the skin., Disp: , Rfl:    clindamycin (CLINDAGEL) 1 % gel, Apply  topically daily., Disp: 30 g, Rfl: 0   clopidogrel (PLAVIX) 75 MG tablet, Take 75 mg by mouth daily., Disp: , Rfl:    co-enzyme Q-10 30 MG capsule, Take 100 mg by mouth daily., Disp: , Rfl:    famotidine (PEPCID) 20 MG tablet, Take 20 mg by mouth daily., Disp: , Rfl:    HYDROcodone-acetaminophen (NORCO/VICODIN) 5-325 MG tablet, Take 1 tablet by mouth every 6 (six) hours as needed for moderate pain., Disp: 12 tablet, Rfl: 0   hydroxychloroquine (PLAQUENIL) 200 MG tablet, Take 200 mg by mouth daily., Disp: , Rfl:    ipratropium (ATROVENT) 0.06 % nasal spray, Place 1 spray into both nostrils 3 (three) times daily., Disp: , Rfl:    ketoconazole (NIZORAL) 2 % cream, Apply 1 Application topically daily., Disp: 30 g, Rfl: 2   leflunomide (ARAVA) 20 MG tablet, Take 20 mg by mouth daily., Disp: , Rfl:    Multiple Vitamins-Minerals (MULTIVITAMIN WOMEN) TABS, Take 1 tablet by mouth daily., Disp: , Rfl:    rosuvastatin (CRESTOR) 5 MG tablet, Take 5 mg by mouth daily., Disp: , Rfl:    acetaminophen (TYLENOL) 500 MG tablet, Take 1,000 mg by mouth every 6 (six) hours as needed for moderate pain or mild pain., Disp: , Rfl:    albuterol (PROVENTIL HFA;VENTOLIN HFA) 108 (90 Base) MCG/ACT inhaler, Inhale 2 puffs into the lungs every 4 (four) hours as needed for wheezing or shortness of breath., Disp: , Rfl:    amLODipine (NORVASC) 10 MG tablet, Take 1 tablet (10 mg total) by mouth daily., Disp: 90 tablet, Rfl: 3   aspirin EC 81 MG tablet, Take 1 tablet (81 mg total) by mouth daily. Swallow whole., Disp: 30 tablet, Rfl: 12   Assessment     ICD-10-CM   1. PAD (peripheral artery disease) (HCC)  I73.9 EKG 12-Lead    2. H/O left femoral artery endarterectomy 06/02/2022  Z98.890     3. Primary hypertension  I10 amLODipine (NORVASC) 10 MG tablet    DISCONTINUED: amLODipine (NORVASC) 10 MG tablet    4. Pure hypercholesterolemia  E78.00        Orders Placed This Encounter  Procedures   EKG 12-Lead    Meds  ordered this encounter  Medications   DISCONTD: amLODipine (NORVASC) 10 MG tablet    Sig: Take 0.5 tablets (5 mg total) by mouth daily.    Dispense:  90 tablet    Refill:  3   amLODipine (NORVASC) 10 MG tablet    Sig: Take 1 tablet (10 mg total) by mouth daily.    Dispense:  90 tablet    Refill:  3    Please disregard 5 mg Rx.  Please discontinue Maxide.   Medications Discontinued During This   Encounter  Medication Reason   terbinafine (LAMISIL) 250 MG tablet Completed Course   trimethoprim-polymyxin b (POLYTRIM) ophthalmic solution Completed Course   DULoxetine (CYMBALTA) 20 MG capsule Completed Course   amLODipine (NORVASC) 5 MG tablet Reorder   triamterene-hydrochlorothiazide (MAXZIDE-25) 37.5-25 MG tablet Patient Preference   amLODipine (NORVASC) 10 MG tablet Reorder   Recommendations:   Donna [name] is a 76 y.o. female patient with hypertension, hyperlipidemia, degenerative joint disease and chronic back pain,  hyperglycemia, stage IIIa chronic kidney disease, prior tobacco use disorder with approximately 20-pack-year history of smoking quit in 2000.  She underwent left femoral endarterectomy on 06/02/2022 complicated by seroma and eventually healed after replacement with a saphenous vein patch and prolonged antibiotics due to Pseudomonas infection.   1. PAD (peripheral artery disease) (HCC) Patient with lifestyle limiting claudication, also she has decreased pulse in the right and her right leg is also cold, even minimal activity brings on symptoms of claudication involving the entire right leg.  She wants to proceed with peripheral arteriogram.  I had already previously discussed in detail regarding the risks and benefits and complications, she is aware of this. - EKG 12-Lead  2. H/O left femoral artery endarterectomy 06/02/2022 Left groin site has now completely healed.  3. Primary hypertension Blood pressure is elevated today however I want to increase the dose of the  Maxide, patient states that it is making her have frequency of urination and would like to discontinue.  Will discontinue Maxide and increase the dose of amlodipine to 10 mg daily. - amLODipine (NORVASC) 10 MG tablet; Take 0.5 tablets (5 mg total) by mouth daily.  Dispense: 90 tablet; Refill: 3  4. Pure hypercholesterolemia Patient's lipids are well-controlled.  Her Lp(a) is pending today.  Will obtain Lp(a) along with CBC and BMP for preprocedure labs.  I will see her back in 6 weeks for follow-up after the peripheral arteriogram and angioplasty to the right lower extremity possibly.     Shivali Quackenbush, MD, FACC 01/17/2023, 6:22 AM Office: [phone] Fax: [phone] Pager: [phone]  

## 2023-01-17 LAB — BASIC METABOLIC PANEL
BUN/Creatinine Ratio: 15 (ref 12–28)
BUN: 20 mg/dL (ref 8–27)
CO2: 22 mmol/L (ref 20–29)
Calcium: 9.7 mg/dL (ref 8.7–10.3)
Chloride: 101 mmol/L (ref 96–106)
Creatinine, Ser: 1.3 mg/dL — ABNORMAL HIGH (ref 0.57–1.00)
Glucose: 95 mg/dL (ref 70–99)
Potassium: 4.4 mmol/L (ref 3.5–5.2)
Sodium: 139 mmol/L (ref 134–144)
eGFR: 42 mL/min/{1.73_m2} — ABNORMAL LOW (ref >59)

## 2023-01-17 LAB — CBC
Hematocrit: 38.5 % (ref 34.0–46.6)
Hemoglobin: 12.9 g/dL (ref 11.1–15.9)
MCH: 31 pg (ref 26.6–33.0)
MCHC: 33.5 g/dL (ref 31.5–35.7)
MCV: 93 fL (ref 79–97)
Platelets: 218 10*3/uL (ref 150–450)
RBC: 4.16 x10E6/uL (ref 3.77–5.28)
RDW: 13.7 % (ref 11.7–15.4)
WBC: 4.7 10*3/uL (ref 3.4–10.8)

## 2023-01-17 LAB — LIPOPROTEIN A (LPA): Lipoprotein (a): 11 nmol/L (ref <75.0)

## 2023-01-25 DIAGNOSIS — M0579 Rheumatoid arthritis with rheumatoid factor of multiple sites without organ or systems involvement: Secondary | ICD-10-CM | POA: Diagnosis not present

## 2023-01-26 ENCOUNTER — Encounter (HOSPITAL_COMMUNITY)
Admission: RE | Disposition: A | Discharge status: Home / Self Care | Payer: Self-pay | Source: Home / Self Care | Attending: Cardiology

## 2023-01-26 ENCOUNTER — Ambulatory Visit (HOSPITAL_COMMUNITY)
Admission: RE | Admit: 2023-01-26 | Discharge: 2023-01-26 | Disposition: A | Discharge status: Home / Self Care | Payer: Medicare Other | Attending: Cardiology | Admitting: Cardiology

## 2023-01-26 DIAGNOSIS — G8929 Other chronic pain: Secondary | ICD-10-CM | POA: Insufficient documentation

## 2023-01-26 DIAGNOSIS — I70201 Unspecified atherosclerosis of native arteries of extremities, right leg: Secondary | ICD-10-CM | POA: Diagnosis not present

## 2023-01-26 DIAGNOSIS — Z79899 Other long term (current) drug therapy: Secondary | ICD-10-CM | POA: Insufficient documentation

## 2023-01-26 DIAGNOSIS — E7800 Pure hypercholesterolemia, unspecified: Secondary | ICD-10-CM | POA: Diagnosis not present

## 2023-01-26 DIAGNOSIS — I129 Hypertensive chronic kidney disease with stage 1 through stage 4 chronic kidney disease, or unspecified chronic kidney disease: Secondary | ICD-10-CM | POA: Insufficient documentation

## 2023-01-26 DIAGNOSIS — N1831 Chronic kidney disease, stage 3a: Secondary | ICD-10-CM | POA: Insufficient documentation

## 2023-01-26 DIAGNOSIS — I70211 Atherosclerosis of native arteries of extremities with intermittent claudication, right leg: Secondary | ICD-10-CM | POA: Diagnosis not present

## 2023-01-26 DIAGNOSIS — M549 Dorsalgia, unspecified: Secondary | ICD-10-CM | POA: Insufficient documentation

## 2023-01-26 DIAGNOSIS — Z87891 Personal history of nicotine dependence: Secondary | ICD-10-CM | POA: Diagnosis not present

## 2023-01-26 DIAGNOSIS — I739 Peripheral vascular disease, unspecified: Secondary | ICD-10-CM

## 2023-01-26 HISTORY — PX: LOWER EXTREMITY ANGIOGRAPHY: CATH118251

## 2023-01-26 LAB — POCT I-STAT, CHEM 8
BUN: 24 mg/dL — ABNORMAL HIGH (ref 8–23)
Calcium, Ion: 0.97 mmol/L — ABNORMAL LOW (ref 1.15–1.40)
Chloride: 108 mmol/L (ref 98–111)
Creatinine, Ser: 1.4 mg/dL — ABNORMAL HIGH (ref 0.44–1.00)
Glucose, Bld: 92 mg/dL (ref 70–99)
HCT: 35 % — ABNORMAL LOW (ref 36.0–46.0)
Hemoglobin: 11.9 g/dL — ABNORMAL LOW (ref 12.0–15.0)
Potassium: 3.6 mmol/L (ref 3.5–5.1)
Sodium: 140 mmol/L (ref 135–145)
TCO2: 24 mmol/L (ref 22–32)

## 2023-01-26 SURGERY — LOWER EXTREMITY ANGIOGRAPHY
Anesthesia: LOCAL

## 2023-01-26 MED ORDER — FENTANYL CITRATE (PF) 100 MCG/2ML IJ SOLN
INTRAMUSCULAR | Status: DC | PRN
  Administered 2023-01-26 (×4): 25 ug via INTRAVENOUS

## 2023-01-26 MED ORDER — ASPIRIN 81 MG PO TBEC: 81.0000 mg | DELAYED_RELEASE_TABLET | Freq: Every day | ORAL | 12 refills | Status: DC

## 2023-01-26 MED ORDER — MIDAZOLAM HCL 2 MG/2ML IJ SOLN
INTRAMUSCULAR | Status: AC
  Filled 2023-01-26: qty 2

## 2023-01-26 MED ORDER — SODIUM CHLORIDE 0.9 % IV SOLN: 250.0000 mL | INTRAVENOUS | Status: DC | PRN

## 2023-01-26 MED ORDER — SODIUM CHLORIDE 0.9% FLUSH: 3.0000 mL | INTRAVENOUS | Status: DC | PRN

## 2023-01-26 MED ORDER — CLOPIDOGREL BISULFATE 75 MG PO TABS
ORAL_TABLET | ORAL | Status: AC
  Filled 2023-01-26: qty 1

## 2023-01-26 MED ORDER — MIDAZOLAM HCL 2 MG/2ML IJ SOLN
INTRAMUSCULAR | Status: DC | PRN
  Administered 2023-01-26: 1 mg via INTRAVENOUS
  Administered 2023-01-26: 2 mg via INTRAVENOUS
  Administered 2023-01-26: 1 mg via INTRAVENOUS

## 2023-01-26 MED ORDER — CLOPIDOGREL BISULFATE 75 MG PO TABS
ORAL_TABLET | ORAL | Status: DC | PRN
  Administered 2023-01-26: 75 mg via ORAL

## 2023-01-26 MED ORDER — IODIXANOL 320 MG/ML IV SOLN
INTRAVENOUS | Status: DC | PRN
  Administered 2023-01-26: 20 mL via INTRA_ARTERIAL

## 2023-01-26 MED ORDER — HYDRALAZINE HCL 20 MG/ML IJ SOLN
INTRAMUSCULAR | Status: DC | PRN
  Administered 2023-01-26: 10 mg via INTRAVENOUS

## 2023-01-26 MED ORDER — HYDRALAZINE HCL 20 MG/ML IJ SOLN
INTRAMUSCULAR | Status: AC
  Filled 2023-01-26: qty 1

## 2023-01-26 MED ORDER — SODIUM CHLORIDE 0.9% FLUSH: 3.0000 mL | Freq: Two times a day (BID) | INTRAVENOUS | Status: DC

## 2023-01-26 MED ORDER — HEPARIN (PORCINE) IN NACL 1000-0.9 UT/500ML-% IV SOLN
INTRAVENOUS | Status: DC | PRN
  Administered 2023-01-26 (×2): 500 mL

## 2023-01-26 MED ORDER — FENTANYL CITRATE (PF) 100 MCG/2ML IJ SOLN
INTRAMUSCULAR | Status: AC
  Filled 2023-01-26: qty 2

## 2023-01-26 MED ORDER — SODIUM CHLORIDE 0.9 % IV SOLN: INTRAVENOUS | Status: DC

## 2023-01-26 MED ORDER — HYDRALAZINE HCL 20 MG/ML IJ SOLN: 5.0000 mg | INTRAMUSCULAR | Status: DC | PRN

## 2023-01-26 MED ORDER — CEFAZOLIN SODIUM-DEXTROSE 2-3 GM-%(50ML) IV SOLR
INTRAVENOUS | Status: DC | PRN
  Administered 2023-01-26: 2 g via INTRAVENOUS

## 2023-01-26 MED ORDER — ACETAMINOPHEN 325 MG PO TABS: 650.0000 mg | ORAL_TABLET | ORAL | Status: DC | PRN

## 2023-01-26 MED ORDER — LIDOCAINE HCL (PF) 1 % IJ SOLN
INTRAMUSCULAR | Status: AC
  Filled 2023-01-26: qty 30

## 2023-01-26 MED ORDER — LIDOCAINE-EPINEPHRINE 1 %-1:100000 IJ SOLN
INTRAMUSCULAR | Status: AC
  Filled 2023-01-26: qty 1

## 2023-01-26 MED ORDER — ASPIRIN 81 MG PO CHEW
CHEWABLE_TABLET | ORAL | Status: AC
  Filled 2023-01-26: qty 1

## 2023-01-26 MED ORDER — ASPIRIN 81 MG PO CHEW
CHEWABLE_TABLET | ORAL | Status: DC | PRN
  Administered 2023-01-26: 81 mg via ORAL

## 2023-01-26 MED ORDER — ONDANSETRON HCL 4 MG/2ML IJ SOLN: 4.0000 mg | Freq: Four times a day (QID) | INTRAMUSCULAR | Status: DC | PRN

## 2023-01-26 MED ORDER — CEFAZOLIN SODIUM-DEXTROSE 2-4 GM/100ML-% IV SOLN
INTRAVENOUS | Status: AC
  Filled 2023-01-26: qty 100

## 2023-01-26 MED ORDER — IOHEXOL 350 MG/ML SOLN
INTRAVENOUS | Status: DC | PRN
  Administered 2023-01-26: 90 mL via INTRA_ARTERIAL

## 2023-01-26 MED ORDER — SODIUM CHLORIDE 0.9 % IV BOLUS
500.0000 mL | Freq: Once | INTRAVENOUS | Status: AC
  Administered 2023-01-26: 500 mL via INTRAVENOUS

## 2023-01-26 MED ORDER — HEPARIN SODIUM (PORCINE) 1000 UNIT/ML IJ SOLN
INTRAMUSCULAR | Status: DC | PRN
  Administered 2023-01-26: 2000 [IU] via INTRAVENOUS
  Administered 2023-01-26: 7000 [IU] via INTRAVENOUS

## 2023-01-26 MED ORDER — SODIUM CHLORIDE 0.9 % WEIGHT BASED INFUSION: 1.0000 mL/kg/h | INTRAVENOUS | Status: DC

## 2023-01-26 MED ORDER — LABETALOL HCL 5 MG/ML IV SOLN: 10.0000 mg | INTRAVENOUS | Status: DC | PRN

## 2023-01-26 MED ORDER — LIDOCAINE HCL (PF) 1 % IJ SOLN
INTRAMUSCULAR | Status: DC | PRN
  Administered 2023-01-26: 15 mL

## 2023-01-26 MED ORDER — LIDOCAINE-EPINEPHRINE 1 %-1:100000 IJ SOLN
INTRAMUSCULAR | Status: DC | PRN
  Administered 2023-01-26: 6 mL

## 2023-01-26 SURGICAL SUPPLY — 18 items
BALLN ADMIRAL INPACT 6X250 (BALLOONS) ×1
BALLOON ADMIRAL INPACT 6X250 (BALLOONS) IMPLANT
CATH HAWKONE LS STANDARD TIP (CATHETERS) ×1
CATH HAWKONE LS STD TIP (CATHETERS) IMPLANT
CATH OMNI FLUSH 5F 65CM (CATHETERS) IMPLANT
CLOSURE PERCLOSE PROSTYLE (VASCULAR PRODUCTS) IMPLANT
DEVICE SPIDERFX EMB PROT 6MM (WIRE) IMPLANT
KIT ENCORE 26 ADVANTAGE (KITS) IMPLANT
KIT MICROPUNCTURE NIT STIFF (SHEATH) IMPLANT
KIT PV (KITS) ×1 IMPLANT
SHEATH CATAPULT 7FR 45 (SHEATH) IMPLANT
SHEATH PINNACLE 5F 10CM (SHEATH) IMPLANT
SHEATH PROBE COVER 6X72 (BAG) IMPLANT
SYR MEDRAD MARK 7 150ML (SYRINGE) ×1 IMPLANT
TAPE SHOOT N SEE (TAPE) IMPLANT
TRANSDUCER W/STOPCOCK (MISCELLANEOUS) ×1 IMPLANT
TRAY PV CATH (CUSTOM PROCEDURE TRAY) ×1 IMPLANT
WIRE BENTSON .035X145CM (WIRE) IMPLANT

## 2023-01-26 NOTE — Interval H&P Note (Signed)
History and Physical Interval Note:  01/26/2023 10:26 AM  Donna [name]  has presented today for surgery, with the diagnosis of coagulation.  The various methods of treatment have been discussed with the patient and family. After consideration of risks, benefits and other options for treatment, the patient has consented to  Procedure(s): Lower Extremity Angiography (N/A) and possible angioplasty as a surgical intervention.  The patient's history has been reviewed, patient examined, no change in status, stable for surgery.  I have reviewed the patient's chart and labs.  Questions were answered to the patient's satisfaction.     Yates Decamp

## 2023-01-29 ENCOUNTER — Encounter (HOSPITAL_COMMUNITY): Payer: Self-pay | Admitting: Cardiology

## 2023-01-29 ENCOUNTER — Other Ambulatory Visit: Payer: Self-pay | Admitting: Cardiology

## 2023-01-29 DIAGNOSIS — Z9889 Other specified postprocedural states: Secondary | ICD-10-CM

## 2023-01-29 DIAGNOSIS — Z9862 Peripheral vascular angioplasty status: Secondary | ICD-10-CM

## 2023-01-29 DIAGNOSIS — I739 Peripheral vascular disease, unspecified: Secondary | ICD-10-CM

## 2023-01-29 NOTE — Progress Notes (Signed)
ICD-10-CM   1. PAD (peripheral artery disease) (HCC)  I73.9 PCV ANKLE BRACHIAL INDEX (ABI)    2. H/O endarterectomy  Z98.890 PCV ANKLE BRACHIAL INDEX (ABI)    3. Status post peripheral artery angioplasty  Z98.62 PCV ANKLE BRACHIAL INDEX (ABI)

## 2023-01-30 LAB — POCT ACTIVATED CLOTTING TIME
Activated Clotting Time: 269 seconds
Activated Clotting Time: 275 seconds
Activated Clotting Time: 281 seconds

## 2023-02-06 ENCOUNTER — Ambulatory Visit: Payer: Medicare Other | Admitting: Cardiology

## 2023-02-06 ENCOUNTER — Encounter: Payer: Self-pay | Admitting: Cardiology

## 2023-02-06 VITALS — BP 97/62 | HR 79 | Resp 16 | Ht 66.0 in | Wt 161.4 lb

## 2023-02-06 DIAGNOSIS — E7800 Pure hypercholesterolemia, unspecified: Secondary | ICD-10-CM

## 2023-02-06 DIAGNOSIS — I10 Essential (primary) hypertension: Secondary | ICD-10-CM | POA: Diagnosis not present

## 2023-02-06 DIAGNOSIS — I739 Peripheral vascular disease, unspecified: Secondary | ICD-10-CM | POA: Diagnosis not present

## 2023-02-06 MED ORDER — ASPIRIN 81 MG PO CHEW
81.0000 mg | CHEWABLE_TABLET | Freq: Every day | ORAL | Status: DC
Start: 2023-02-06 — End: 2023-08-15

## 2023-02-06 NOTE — Progress Notes (Signed)
Primary Physician/Referring:  Merri Brunette, MD  Patient ID: Donna [name], female    DOB: 1946/04/18, 77 y.o.   MRN: [id number]  Chief Complaint  Patient presents with   cath follow up   HPI:    Donna [name]  is a 77 y.o.female patient with hypertension, hyperlipidemia, hyperglycemia, stage IIIa chronic kidney disease, degenerative joint disease and chronic back pain, prior tobacco use disorder with approximately 20-pack-year history of smoking quit in 2000, left femoral endarterectomy on 06/02/2022 complicated by seroma and eventually healed after replacement with a saphenous vein patch and prolonged antibiotics due to Pseudomonas infection. Due to symptoms of lifestyle-limiting UNDERWENT peripheral arteriogram on 01/18/2023 and successful atherectomy followed by drug-coated balloon angioplasty of the right SFA with excellent result.  She now presents for follow-up.  She has noticed marked improvement in symptoms of claudication and has started walking without any limitations.  She is extremely happy that she underwent angiography.  She has not had any complications from the procedure. Otherwise denies chest pain, dyspnea remained stable.  No PND or orthopnea.  Past Medical History:  Diagnosis Date   Arthritis    lumbar, toes, L knee, cerv. spine & hands  - rheumatoid   Cancer (HCC)    basal cell removed fr. 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 Gillermina Phy, but prev. saw Dr. Jacinto Halim   Peripheral vascular disease [facility])    Seasonal allergies    Past Surgical History:  Procedure Laterality Date   ABDOMINAL  AORTOGRAM W/LOWER EXTREMITY N/A 05/30/2022   Procedure: ABDOMINAL AORTOGRAM W/LOWER EXTREMITY;  Surgeon: Yates Decamp, MD;  Location: [facility] INVASIVE CV LAB;  Service: Cardiovascular;  Laterality: N/A;   APPLICATION OF WOUND VAC Left 07/05/2022   Procedure: APPLICATION OF WOUND VAC;  Surgeon: Nada Libman, MD;  Location: [facility] OR;  Service: Vascular;  Laterality: Left;   APPLICATION OF WOUND VAC Left 07/05/2022   Procedure: APPLICATION OF WOUND VAC;  Surgeon: Nada Libman, MD;  Location: [facility] OR;  Service: Vascular;  Laterality: Left;   BACK SURGERY  07/31/1992   cerv. spine fusion    BREAST ENHANCEMENT SURGERY     BREAST SURGERY  04/01/1979   implants- bilateral    CATARACT EXTRACTION Left 12/2022   CERVICAL SPINE SURGERY  07/31/1990   fusion   ENDARTERECTOMY FEMORAL Left 06/02/2022   Procedure: LEFT ENDARTERECTOMY FEMORAL with BOVINE PATCH;  Surgeon: Nada Libman, MD;  Location: [facility] OR;  Service: Vascular;  Laterality: Left;   GROIN DEBRIDEMENT Left 07/05/2022   Procedure: LEFT Marden Noble WITH STIMULAN ANTIBIOTIC BEAD AND KERECIS FISH SKIN GRAFT APPLICATION;  Surgeon: Nada Libman, MD;  Location: [facility] OR;  Service: Vascular;  Laterality: Left;   INCISION AND DRAINAGE Left 07/05/2022   Procedure: LEFT GROIN WASHOUT;  Surgeon: Nada Libman, MD;  Location: [facility] OR;  Service: Vascular;  Laterality: Left;   JOINT REPLACEMENT Right 07/31/2005   LOWER EXTREMITY ANGIOGRAPHY N/A 01/26/2023   Procedure: Lower Extremity Angiography;  Surgeon:  Yates Decamp, MD;  Location: [facility] INVASIVE CV LAB;  Service: Cardiovascular;  Laterality: N/A;   LUMBAR LAMINECTOMY/DECOMPRESSION MICRODISCECTOMY Left 10/07/2015   Procedure: Lumbar three-five Decompressive lumbar Laminectomy & Left Lumbar four-five Microdiscectomy;  Surgeon: Shirlean Kelly, MD;  Location: [facility] NEURO ORS;  Service: Neurosurgery;  Laterality: Left;   LUMBAR SPINE SURGERY  2016   PATCH ANGIOPLASTY Left 07/05/2022   Procedure: VEIN PATCH  ANGIOPLASTY;  Surgeon: Nada Libman, MD;  Location: [facility] OR;  Service: Vascular;  Laterality: Left;   TONSILLECTOMY     TOTAL HIP ARTHROPLASTY  07/31/2005   Right   TUBAL LIGATION     Family History  Problem Relation Age of Onset   Kidney cancer Mother    Lung cancer Sister    Heart disease Maternal Aunt    Heart disease Maternal Aunt    Kidney cancer Maternal Aunt    Kidney cancer Maternal Uncle     Social History   Tobacco Use   Smoking status: Former    Packs/day: 0.70    Years: 24.00    Additional pack years: 0.00    Total pack years: 16.80    Types: Cigarettes    Quit date: 07/31/2009    Years since quitting: 13.5   Smokeless tobacco: Former    Quit date: 10/01/1998  Substance Use Topics   Alcohol use: Yes    Comment: wine- rarely   Marital Status: Married  ROS  Review of Systems  Cardiovascular:  Positive for claudication, cyanosis and leg swelling. Negative for chest pain, dyspnea on exertion, paroxysmal nocturnal dyspnea and syncope.   Objective   Review of Systems  Cardiovascular:  Negative for chest pain, claudication, dyspnea on exertion and leg swelling.   Blood pressure 97/62, pulse 79, resp. rate 16, height 5\' 6"  (1.676 m), weight 161 lb 6.4 oz (73.2 kg), SpO2 93 %. Body mass index is 26.05 kg/m.     02/06/2023    1:50 PM 01/26/2023    5:30 PM 01/26/2023    4:30 PM  Vitals with BMI  Height 5\' 6"     Weight 161 lbs 6 oz    BMI 26.06    Systolic 97 137 136  Diastolic 62 57 58  Pulse 79 78 78    Physical Exam Neck:     Vascular: Carotid bruit (bilateral) present. No JVD.  Cardiovascular:     Rate and Rhythm: Normal rate and regular rhythm.     Pulses:          Radial pulses are 2+ on the right side and 2+ on the left side.       Femoral pulses are 1+ on the right side and 1+ on the left side.      Popliteal pulses are 0 on the right side and 0 on the left side.       Dorsalis pedis pulses are 0 on the right side and 2+ on the left side.        Posterior tibial pulses are 0 on the right side and 0 on the left side.     Heart sounds: Normal heart sounds. No murmur heard.    No gallop.  Pulmonary:     Effort: Pulmonary effort is normal.     Breath sounds: Normal breath sounds. No wheezing or rales.  Abdominal:     General: Bowel sounds are normal.     Palpations: Abdomen is soft.  Musculoskeletal:     Right lower leg: No edema.  Left lower leg: No edema.  Feet:     Right foot:     Skin integrity: Skin breakdown and warmth present.     Left foot:     Skin integrity: Skin breakdown, erythema and warmth present.     Comments: Right fifth toe erythematous wound with purulent drainage. Discoloration noted to bilateral toes. Skin:    General: Skin is warm.     Capillary Refill: Capillary refill takes less than 2 seconds.    Laboratory examination:   Lab Results  Component Value Date   NA 140 01/26/2023   K 3.6 01/26/2023   CO2 22 01/16/2023   GLUCOSE 92 01/26/2023   BUN 24 (H) 01/26/2023   CREATININE 1.40 (H) 01/26/2023   CALCIUM 9.7 01/16/2023   EGFR 42 (L) 01/16/2023   GFRNONAA 52 (L) 07/06/2022       Latest Ref Rng & Units 01/26/2023    7:44 AM 01/16/2023   10:01 AM 07/06/2022    2:00 AM  CMP  Glucose 70 - 99 mg/dL 92  95  161   BUN 8 - 23 mg/dL 24  20  16    Creatinine 0.44 - 1.00 mg/dL 0.96  0.45  4.09   Sodium 135 - 145 mmol/L 140  139  136   Potassium 3.5 - 5.1 mmol/L 3.6  4.4  3.2   Chloride 98 - 111 mmol/L 108  101  101   CO2 20 - 29 mmol/L  22  25   Calcium 8.7 - 10.3 mg/dL  9.7  8.6       Latest Ref Rng & Units 01/26/2023    7:44 AM 01/16/2023   10:01 AM 07/06/2022    2:00 AM  CBC  WBC 3.4 - 10.8 x10E3/uL  4.7  10.0   Hemoglobin 12.0 - 15.0 g/dL 81.1  91.4  9.7   Hematocrit 36.0 - 46.0 % 35.0  38.5  27.5   Platelets 150 - 450 x10E3/uL  218  229     Lipid Panel Recent Labs    06/02/22 2359  CHOL 125  TRIG 64  LDLCALC 48  VLDL 13  HDL 64  CHOLHDL 2.0   Lab Results  Component Value  Date   TSH 0.51 10/26/2020    External labs:   02/15/2022: Sodium 140, potassium 4.0, glucose 104, BUN 21, creatinine 1.24 EGFR 42 Hemoglobin 13.7, hematocrit 40.5, platelet count 244  08/30/2021: Hemoglobin A1c 6.0%  Radiology:   High-resolution CT scan of the chest 03/02/2022: 1. Cardiovascular: Normal heart size. No pericardial effusion. Mitral annular calcifications. Severe three-vessel coronary artery calcifications. Normal caliber thoracic aorta with severe calcified plaque.  2. Aortic Atherosclerosis and Emphysema  3. Part solid nodule of the right upper lobe measuring 18 mm in overall diameter with 4 mm solid component. Follow-up non-contrast CT recommended at 3-6 months to confirm persistence.  Additional bilateral solid pulmonary nodules are seen, largest measures 4 mm. Recommend attention on follow-up.  Cardiac Studies:   PCV MYOCARDIAL PERFUSION WITH LEXISCAN 05/22/2022  Lexiscan nuclear stress test performed using 1-day protocol. SPECT images show decreased tracer uptake in inferior myocardium, likely due to breast attenuation with imaging performed in the sitting position. Stress LVEF 65%.   PCV ECHOCARDIOGRAM COMPLETE 05/25/2022  Study Quality: Technically difficult study. Normal LV systolic function with visual EF 60-65%. Left ventricle cavity is normal in size. Normal left ventricular wall thickness. Normal global wall motion. Normal diastolic filling pattern, normal LAP. Trace tricuspid regurgitation. No evidence of pulmonary hypertension.  Small pericardial effusion. There is no hemodynamic significance. Compared to 10/17/2017: G2DD & elevated LAP are now normal, otherwise no significant change.  Carotid artery duplex 05/25/2022: Duplex suggests stenosis in the right internal carotid artery (1-15%). No evidence of significant stenosis in the right external carotid artery. Duplex suggests stenosis in the left internal carotid artery (1-15%). No evidence of significant  stenosis in the left external carotid artery. Antegrade right vertebral artery flow. Antegrade left vertebral artery flow. Mild heterogeneous plaque noted bilaterally. Compared to the study done on 03/01/2020, left ICA stenosis of 50 to 49% not present.  Lower Extremity Arterial Duplex 05/25/2022: Diffuse calcific plaque throughout the bilateral lower extremity arteries.  Right CFA & PFA stenosis >50. Right distal SFA >50% stenosis.  Left CFA with severe dampened monophasic waveform suggests significant proximal vessel disease (Iliac artery).  Left CFA and Proximal SFA stenosis of >50%.  This exam reveals mildly decreased perfusion of the right lower extremity, noted at the post tibial artery level (ABI 0.81), severe dampened monophasic waveform pattern at the ankle.   This exam reveals moderately decreased perfusion of the left lower extremity, noted at the dorsalis pedis artery level (ABI 0.79), severe dampened monophasic waveform pattern at the ankle.  Bilateral baker's cyst noted.   Left Femoral endarterectomy 06/02/2022: Extensive plaque from the inguinal ligament down in to the SFA and profunda.  The patch extends onto the SFA for 1.5 cm.   Eversion endarterectomy of the profunda   ABI 06/16/2022: This exam reveals moderately decreased perfusion of the right lower extremity, noted at the dorsalis pedis and post tibial artery level (ABI 0.64) with markedly dampened monophasic waveform at the right ankle. This exam reveals mildly decreased perfusion of the left lower extremity, noted at the dorsalis pedis artery level (ABI 0.94) with mildly abnormal biphasic waveform pattern at the left ankle.  Compared to prior study 05/25/2022, further reduction in right ABI.  Left ABI has improved from 0.79, severely dampened monophasic waveform is now replaced by mildly abnormal biphasic waveforms and suggest successful left femoral endarterectomy and successful revascularization.  Peripheral arteriogram  and angioplasty right SFA 01/18/2023: Distal abdominal aortogram with limited peripheral arteriogram revealed aortoiliac bifurcation to be widely patent.  Left iliac and left common femoral artery are widely patent.  Left common femoral artery patch angioplasty site is widely patent.  Proximal segment of the left SFA is widely patent. Right iliac vessels and right common femoral artery widely patent.  Right SFA in the proximal, proximal to mid, mid and distal segment has tandem 80, 90, 80, 95% stenosis. Successful directional atherectomy with HawkOne LS followed by drug-coated balloon angioplasty with 6.0 x 250 mm In.Pact Admiral balloon, stenosis reduced to 0% with brisk flow.   EKG:   EKG 01/16/2023: Normal sinus rhythm at rate of 73 bpm, normal axis, no evidence of ischemia, normal EKG.   Medications and allergies   Allergies  Allergen Reactions   Thorazine [Chlorpromazine] Anaphylaxis    Current Outpatient Medications:    acetaminophen (TYLENOL) 500 MG tablet, Take 1,000 mg by mouth every 6 (six) hours as needed for moderate pain or mild pain., Disp: , Rfl:    albuterol (PROVENTIL HFA;VENTOLIN HFA) 108 (90 Base) MCG/ACT inhaler, Inhale 2 puffs into the lungs every 4 (four) hours as needed for wheezing or shortness of breath., Disp: , Rfl:    amLODipine (NORVASC) 10 MG tablet, Take 1 tablet (10 mg total) by mouth daily., Disp: 90 tablet, Rfl: 3   aspirin (ASPIRIN  CHILDRENS) 81 MG chewable tablet, Chew 1 tablet (81 mg total) by mouth daily., Disp: , Rfl:    bisoprolol (ZEBETA) 5 MG tablet, TAKE 1 TABLET BY MOUTH DAILY, Disp: 90 tablet, Rfl: 1   BREZTRI AEROSPHERE 160-9-4.8 MCG/ACT AERO, INHALE 2 INHALATIONS BY MOUTH  INTO THE LUNGS IN THE MORNING  AND AT BEDTIME, Disp: 32.1 g, Rfl: 3   buPROPion (WELLBUTRIN XL) 150 MG 24 hr tablet, Take 150 mg by mouth in the morning. 150 mg +300 mg=450 mg, Disp: , Rfl:    buPROPion (WELLBUTRIN XL) 300 MG 24 hr tablet, Take 300 mg by mouth daily. 300 mg +150  mg=450 mg, Disp: , Rfl:    certolizumab pegol (CIMZIA) 2 X 200 MG KIT, Inject into the skin every 30 (thirty) days., Disp: , Rfl:    famotidine (PEPCID) 20 MG tablet, Take 20 mg by mouth 2 (two) times daily., Disp: , Rfl:    HYDROcodone-acetaminophen (NORCO/VICODIN) 5-325 MG tablet, Take 1 tablet by mouth every 6 (six) hours as needed for moderate pain., Disp: 12 tablet, Rfl: 0   hydroxychloroquine (PLAQUENIL) 200 MG tablet, Take 200 mg by mouth in the morning., Disp: , Rfl:    ipratropium (ATROVENT) 0.06 % nasal spray, Place 1 spray into both nostrils in the morning., Disp: , Rfl:    leflunomide (ARAVA) 20 MG tablet, Take 20 mg by mouth in the morning., Disp: , Rfl:    Multiple Vitamins-Minerals (MULTIVITAMIN WOMEN) TABS, Take 1 tablet by mouth in the morning., Disp: , Rfl:    rosuvastatin (CRESTOR) 5 MG tablet, Take 5 mg by mouth in the morning., Disp: , Rfl:    Assessment     ICD-10-CM   1. PAD (peripheral artery disease) (HCC)  I73.9 aspirin (ASPIRIN CHILDRENS) 81 MG chewable tablet    PCV ANKLE BRACHIAL INDEX (ABI)    2. Pure hypercholesterolemia  E78.00     3. Primary hypertension  I10        No orders of the defined types were placed in this encounter.   Meds ordered this encounter  Medications   aspirin (ASPIRIN CHILDRENS) 81 MG chewable tablet    Sig: Chew 1 tablet (81 mg total) by mouth daily.   Medications Discontinued During This Encounter  Medication Reason   aspirin EC 81 MG tablet Change in therapy   clopidogrel (PLAVIX) 75 MG tablet Completed Course    Recommendations:   ROSMERY MARKLEY is a 77 y.o. female patient with hypertension, hyperlipidemia, hyperglycemia, stage IIIa chronic kidney disease, degenerative joint disease and chronic back pain, prior tobacco use disorder with approximately 20-pack-year history of smoking quit in 2000, left femoral endarterectomy on 06/02/2022 complicated by seroma and eventually healed after replacement with a saphenous vein patch  and prolonged antibiotics due to Pseudomonas infection. Due to symptoms of lifestyle-limiting UNDERWENT peripheral arteriogram on 01/18/2023 and successful atherectomy followed by drug-coated balloon angioplasty of the right SFA with excellent result.  She now presents for follow-up.  1. PAD (peripheral artery disease) (HCC) Since angioplasty to the right lower extremity, patient has had significant improvement in symptoms of claudication, right leg is also warm, she has no open wounds and toes are pink.  Continue aspirin indefinitely, switch her over to plain aspirin 81 mg daily and she can discontinue Plavix after 30 days since angioplasty.  She does have mild ecchymosis and easy bruising since being on aspirin and Plavix.  - aspirin (ASPIRIN CHILDRENS) 81 MG chewable tablet; Chew 1 tablet (81 mg total)  by mouth daily. - PCV ANKLE BRACHIAL INDEX (ABI); Future  2. Pure hypercholesterolemia Lipids in excellent control.  Continue high intensity statins.  3. Primary hypertension Patient is doing well, has low blood pressure today but completely asymptomatic.  She does not want to change any of her medication.  Could certainly consider reducing the dose of the amlodipine to 5 mg however patient states that as she is doing well and she has no symptoms from low blood pressure she would like to prefer to continue present medications.  ABI has been ordered in the next couple weeks, will also repeat ABI in 3 months for patency and I will see her back in 6 months for follow-up.     Yates Decamp, MD, [facility] 02/06/2023, 2:19 PM Office: [phone] Fax: [phone] Pager: [phone]

## 2023-02-07 ENCOUNTER — Ambulatory Visit
Admission: RE | Admit: 2023-02-07 | Discharge: 2023-02-07 | Disposition: A | Discharge status: Home / Self Care | Payer: Medicare Other | Source: Ambulatory Visit | Attending: Internal Medicine | Admitting: Internal Medicine

## 2023-02-07 DIAGNOSIS — N200 Calculus of kidney: Secondary | ICD-10-CM | POA: Diagnosis not present

## 2023-02-07 DIAGNOSIS — K7460 Unspecified cirrhosis of liver: Secondary | ICD-10-CM | POA: Diagnosis not present

## 2023-02-07 DIAGNOSIS — I700 Atherosclerosis of aorta: Secondary | ICD-10-CM | POA: Diagnosis not present

## 2023-02-07 DIAGNOSIS — R911 Solitary pulmonary nodule: Secondary | ICD-10-CM | POA: Diagnosis not present

## 2023-02-07 DIAGNOSIS — J439 Emphysema, unspecified: Secondary | ICD-10-CM | POA: Diagnosis not present

## 2023-02-08 NOTE — Progress Notes (Signed)
Donna [name], female    DOB: 1946/07/21,     MRN: [id number]   Brief patient profile:  49   yowf with RA quit smoking 2011/MM  with eval by K Clance GOLD 2 no change with spiriva so d/c'd just used ventolin sporadically but around summer 2020 moved to [facility] which is normally just summer vacation spot and there experenced cough/wheeze / sob rx more albuterol up to twice daily so self referred back to pulmonary clinic 12/25/2019    History of Present Illness  12/25/2019  Pulmonary/ 1st office eval/Donna [name] / s/p 2nd PFizer  Chief Complaint  Patient presents with   Consult    pt states uses rescue inhaler everyday. pt states when doing activities sob  Dyspnea:  MMRC2 = can't walk a nl pace on a flat grade s sob but does fine slow and flat  Cough: white phelgm worse in am  Sleep: flat, sleeps two pillows  SABA use: twice daily  Also has omeprazole but not using ac rec Plan A = Automatic = Always=    Breztri Take 2 puffs first thing in am and then another 2 puffs about 12 hours later.  Work on inhaler technique: Plan B = Backup (to supplement plan A, not to replace it) Only use your albuterol inhaler as a rescue medication  Omeprazole Take 30-60 min before first meal of the day  Stop fosfamax until return Stop metaprolol and start Bisoprolol 5 mg one daily (won't interfere with albuterol)          12/20/2021  f/u ov/Donna [name] re: COPD GOLD 2 /RA   maint on breztri 2bid   Chief Complaint  Patient presents with   Follow-up    Cough with clear to white mucous.   Dyspnea:  no change = MMRC2 = can't walk a nl pace on a flat grade s sob but does fine slow and flat  Cough: clear mucus 24/7 x 3 weeks/ always has runny nose years stopped atrovent 3 days into ? [place] and much worse since then  Sleeping: not aware of cough at hs  SABA use: not using  02: none  Rec Zpak  Resume your prior atrovent nasal spray  Prednisone 10 mg take  4 each am x 2 days,   2 each am x 2 days,  1 each am x 2 days and  stop   For drainage / throat tickle try take CHLORPHENIRAMINE  4 mg   Please schedule a follow up visit in 12  months but call sooner if needed   02/09/2023 Yearly  f/u ov/Donna [name] re: GOLD 2 copd/ RA with Nodules  maint on breztri  and f/u CT chest 02/07/23  Chief Complaint  Patient presents with   Follow-up    States only sob with activity, having sinus drainage at night causing cough  in morning  Dyspnea:  not limited from breathing but by  L knee pain/ considering  L TKR  Cough: mostly pnds related  Sleeping: bed is flat on side with one pillow no resp cc  SABA use: rarely  02: none     No obvious day to day or daytime variability or assoc excess/ purulent sputum or mucus plugs or hemoptysis or cp or chest tightness, subjective wheeze or overt sinus or hb symptoms.    Also denies any obvious fluctuation of symptoms with weather or environmental changes or other aggravating or alleviating factors except as outlined above   No unusual exposure hx or h/o  childhood pna/ asthma or knowledge of premature birth.  Current Allergies, Complete Past Medical History, Past Surgical History, Family History, and Social History were reviewed in [employer] record.  ROS  The following are not active complaints unless bolded Hoarseness, sore throat, dysphagia, dental problems, itching, sneezing,  nasal congestion or discharge of excess mucus or purulent secretions, ear ache,   fever, chills, sweats, unintended wt loss or wt gain, classically pleuritic or exertional cp,  orthopnea pnd or arm/hand swelling  or leg swelling, presyncope, palpitations, abdominal pain, anorexia, nausea, vomiting, diarrhea  or change in bowel habits or change in bladder habits, change in stools or change in urine, dysuria, hematuria,  rash, arthralgias, visual complaints, headache, numbness, weakness or ataxia or problems with walking or coordination,  change in mood or  memory.        Current Meds   Medication Sig   acetaminophen (TYLENOL) 500 MG tablet Take 1,000 mg by mouth every 6 (six) hours as needed for moderate pain or mild pain.   albuterol (PROVENTIL HFA;VENTOLIN HFA) 108 (90 Base) MCG/ACT inhaler Inhale 2 puffs into the lungs every 4 (four) hours as needed for wheezing or shortness of breath.   amLODipine (NORVASC) 10 MG tablet Take 1 tablet (10 mg total) by mouth daily.   aspirin (ASPIRIN CHILDRENS) 81 MG chewable tablet Chew 1 tablet (81 mg total) by mouth daily.   bisoprolol (ZEBETA) 5 MG tablet TAKE 1 TABLET BY MOUTH DAILY   BREZTRI AEROSPHERE 160-9-4.8 MCG/ACT AERO INHALE 2 INHALATIONS BY MOUTH  INTO THE LUNGS IN THE MORNING  AND AT BEDTIME   buPROPion (WELLBUTRIN XL) 150 MG 24 hr tablet Take 150 mg by mouth in the morning. 150 mg +300 mg=450 mg   buPROPion (WELLBUTRIN XL) 300 MG 24 hr tablet Take 300 mg by mouth daily. 300 mg +150 mg=450 mg   certolizumab pegol (CIMZIA) 2 X 200 MG KIT Inject into the skin every 30 (thirty) days.   DULoxetine (CYMBALTA) 20 MG capsule Take 20 mg by mouth daily.   famotidine (PEPCID) 20 MG tablet Take 20 mg by mouth 2 (two) times daily.   HYDROcodone-acetaminophen (NORCO/VICODIN) 5-325 MG tablet Take 1 tablet by mouth every 6 (six) hours as needed for moderate pain.   hydroxychloroquine (PLAQUENIL) 200 MG tablet Take 200 mg by mouth in the morning.   ipratropium (ATROVENT) 0.06 % nasal spray Place 1 spray into both nostrils in the morning.   leflunomide (ARAVA) 20 MG tablet Take 20 mg by mouth in the morning.   Multiple Vitamins-Minerals (MULTIVITAMIN WOMEN) TABS Take 1 tablet by mouth in the morning.   rosuvastatin (CRESTOR) 5 MG tablet Take 5 mg by mouth in the morning.            Past Medical History:  Diagnosis Date   Arthritis    lumbar, toes, L knee, cerv. spine & hands    Cancer (HCC)    basal cell removed fr. face    COPD (chronic obstructive pulmonary disease) (HCC)    GERD (gastroesophageal reflux disease)    H/O  echocardiogram      Objective:    Wts  02/09/2023       160 12/20/2021       159  12/20/2020       154 10/26/2020       146   02/13/20 161 lb 3.2 oz (73.1 kg)  12/25/19 172 lb (78 kg)  10/01/15 180 lb 6.4 oz (81.8 kg)  Vital signs reviewed  02/09/2023  - Note at rest 02 sats  95% on RA   General appearance:    amb wf nad     HEENT : Oropharynx  clear      NECK :  without  apparent JVD/ palpable Nodes/TM    LUNGS: no acc muscle use,  Min barrel  contour chest wall with bilateral  slightly decreased bs s audible wheeze and  without cough on insp or exp maneuvers and min  Hyperresonant  to  percussion bilaterally    CV:  RRR  no s3 or murmur or increase in P2, and no edema   ABD:  soft and nontender with pos end  insp Hoover's  in the supine position.  No bruits or organomegaly appreciated   MS:  Nl gait/ ext warm without deformities Or obvious joint restrictions  calf tenderness, cyanosis or clubbing     SKIN: warm and dry without lesions    NEURO:  alert, approp, nl sensorium with  no motor or cerebellar deficits apparent.           I personally reviewed images and agree with radiology impression as follows:   Chest CT w/o contrast   02/07/23   = no change, rec f/u12 m    Assessment

## 2023-02-09 ENCOUNTER — Encounter: Payer: Self-pay | Admitting: Internal Medicine

## 2023-02-09 ENCOUNTER — Ambulatory Visit: Payer: Medicare Other | Admitting: Internal Medicine

## 2023-02-09 VITALS — BP 140/64 | HR 64 | Temp 98.0°F | Ht 66.0 in | Wt 160.8 lb

## 2023-02-09 DIAGNOSIS — M069 Rheumatoid arthritis, unspecified: Secondary | ICD-10-CM | POA: Diagnosis not present

## 2023-02-09 DIAGNOSIS — R911 Solitary pulmonary nodule: Secondary | ICD-10-CM

## 2023-02-09 DIAGNOSIS — I10 Essential (primary) hypertension: Secondary | ICD-10-CM

## 2023-02-09 DIAGNOSIS — J449 Chronic obstructive pulmonary disease, unspecified: Secondary | ICD-10-CM | POA: Diagnosis not present

## 2023-02-09 MED ORDER — BISOPROLOL FUMARATE 5 MG PO TABS
ORAL_TABLET | ORAL | 1 refills | Status: DC
Start: 2023-02-09 — End: 2023-02-09

## 2023-02-09 MED ORDER — BREZTRI AEROSPHERE 160-9-4.8 MCG/ACT IN AERO: INHALATION_SPRAY | RESPIRATORY_TRACT | 3 refills | Status: DC

## 2023-02-09 MED ORDER — IPRATROPIUM BROMIDE 0.06 % NA SOLN: 1.0000 | Freq: Every morning | NASAL | 11 refills | Status: DC

## 2023-02-09 NOTE — Patient Instructions (Signed)
Make sure you check your oxygen saturation at your highest level of activity(NOT after you stop)  to be sure it stays over 90% and keep track of it at least once a week, more often if breathing getting worse, and let me know if losing ground. (Collect the dots to connect the dots approach)    No change in medications   Ok to use atrovent nasal spray as needed   Please schedule a follow up visit in 12 months but call sooner if needed

## 2023-02-10 NOTE — Assessment & Plan Note (Addendum)
Quit smoking in 2011  PFT's 2012: FEV1 1.77 (74%), ratio 55, +airtrapping, TLC normal, DLCO 55% - 12/25/2019  After extensive coaching inhaler device,  effectiveness =    50% > breztri Take 2 puffs first thing in am and then another 2 puffs about 12 hours later.  - 02/13/2020  After extensive coaching inhaler device,  effectiveness =    90%  - [facility] screen  02/13/20 :   Level 185  MM - PFT's  12/20/2020  FEV1 1.82 (79% ) ratio 0.63  p 1 % improvement from saba p breztri prior to study with DLCO  10.65 (52%) corrects to 2.03 (49%)  for alv volume and FV curve mild concavity    Group D (now reclassified as E) in terms of symptom/risk and laba/lama/ICS  therefore appropriate rx at this point >>>  breaztri and using approp saba   Doing well x for rhinitis symptoms but hasn't really given rec atrovent NS a try and suggested she do so with f/u by ent prn   F/u here can be q 12 m, sooner if needed

## 2023-02-12 ENCOUNTER — Encounter: Payer: Self-pay | Admitting: Internal Medicine

## 2023-02-12 ENCOUNTER — Telehealth: Payer: Self-pay | Admitting: Internal Medicine

## 2023-02-12 ENCOUNTER — Other Ambulatory Visit (HOSPITAL_COMMUNITY): Payer: Self-pay

## 2023-02-12 DIAGNOSIS — R911 Solitary pulmonary nodule: Secondary | ICD-10-CM | POA: Insufficient documentation

## 2023-02-12 NOTE — Telephone Encounter (Signed)
Patient is returning phone call. Patient phone number is [phone].

## 2023-02-13 NOTE — Assessment & Plan Note (Signed)
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  Total time for H and P, chart review, counseling, reviewing images and generating customized AVS unique to this office visit / same day charting = 20 min

## 2023-02-14 DIAGNOSIS — M0579 Rheumatoid arthritis with rheumatoid factor of multiple sites without organ or systems involvement: Secondary | ICD-10-CM | POA: Diagnosis not present

## 2023-02-14 DIAGNOSIS — G5600 Carpal tunnel syndrome, unspecified upper limb: Secondary | ICD-10-CM | POA: Diagnosis not present

## 2023-02-14 DIAGNOSIS — M79643 Pain in unspecified hand: Secondary | ICD-10-CM | POA: Diagnosis not present

## 2023-02-14 DIAGNOSIS — M25562 Pain in left knee: Secondary | ICD-10-CM | POA: Diagnosis not present

## 2023-02-14 DIAGNOSIS — M797 Fibromyalgia: Secondary | ICD-10-CM | POA: Diagnosis not present

## 2023-02-14 DIAGNOSIS — Z79899 Other long term (current) drug therapy: Secondary | ICD-10-CM | POA: Diagnosis not present

## 2023-02-14 DIAGNOSIS — N1831 Chronic kidney disease, stage 3a: Secondary | ICD-10-CM | POA: Diagnosis not present

## 2023-02-14 DIAGNOSIS — R768 Other specified abnormal immunological findings in serum: Secondary | ICD-10-CM | POA: Diagnosis not present

## 2023-02-14 DIAGNOSIS — M1990 Unspecified osteoarthritis, unspecified site: Secondary | ICD-10-CM | POA: Diagnosis not present

## 2023-02-16 NOTE — Telephone Encounter (Signed)
Pt has been made aware of ct results through my chart. Closing encounter. NFN

## 2023-02-22 DIAGNOSIS — M0579 Rheumatoid arthritis with rheumatoid factor of multiple sites without organ or systems involvement: Secondary | ICD-10-CM | POA: Diagnosis not present

## 2023-02-28 ENCOUNTER — Ambulatory Visit: Payer: Medicare Other

## 2023-02-28 DIAGNOSIS — Z9862 Peripheral vascular angioplasty status: Secondary | ICD-10-CM | POA: Diagnosis not present

## 2023-02-28 DIAGNOSIS — Z9889 Other specified postprocedural states: Secondary | ICD-10-CM

## 2023-02-28 DIAGNOSIS — I739 Peripheral vascular disease, unspecified: Secondary | ICD-10-CM | POA: Diagnosis not present

## 2023-03-05 ENCOUNTER — Other Ambulatory Visit: Payer: Self-pay | Admitting: Internal Medicine

## 2023-03-05 DIAGNOSIS — I10 Essential (primary) hypertension: Secondary | ICD-10-CM

## 2023-03-06 DIAGNOSIS — M9906 Segmental and somatic dysfunction of lower extremity: Secondary | ICD-10-CM | POA: Diagnosis not present

## 2023-03-06 DIAGNOSIS — M9903 Segmental and somatic dysfunction of lumbar region: Secondary | ICD-10-CM | POA: Diagnosis not present

## 2023-03-06 DIAGNOSIS — M5136 Other intervertebral disc degeneration, lumbar region: Secondary | ICD-10-CM | POA: Diagnosis not present

## 2023-03-06 DIAGNOSIS — M9905 Segmental and somatic dysfunction of pelvic region: Secondary | ICD-10-CM | POA: Diagnosis not present

## 2023-03-11 NOTE — Progress Notes (Signed)
ABI 02/28/2023: This exam reveals normal perfusion of the right lower extremity (ABI 0.98).   This exam reveals mildly decreased perfusion of the left lower extremity, noted at the post tibial artery level (ABI 0.94).  There is markedly abnormal monophasic waveform pattern at the bilateral ankles suggest diffuse small vessel disease.  Compared to the study done on 06/16/2022, right ABI has improved from 0.64 and no change in the left ABI.  This represents successful revascularization of the right lower extremity and continued patency of the left angioplasty at the SFA level.

## 2023-03-12 DIAGNOSIS — M9905 Segmental and somatic dysfunction of pelvic region: Secondary | ICD-10-CM | POA: Diagnosis not present

## 2023-03-12 DIAGNOSIS — M5136 Other intervertebral disc degeneration, lumbar region: Secondary | ICD-10-CM | POA: Diagnosis not present

## 2023-03-12 DIAGNOSIS — M9906 Segmental and somatic dysfunction of lower extremity: Secondary | ICD-10-CM | POA: Diagnosis not present

## 2023-03-12 DIAGNOSIS — M9903 Segmental and somatic dysfunction of lumbar region: Secondary | ICD-10-CM | POA: Diagnosis not present

## 2023-03-14 DIAGNOSIS — M9905 Segmental and somatic dysfunction of pelvic region: Secondary | ICD-10-CM | POA: Diagnosis not present

## 2023-03-14 DIAGNOSIS — M9903 Segmental and somatic dysfunction of lumbar region: Secondary | ICD-10-CM | POA: Diagnosis not present

## 2023-03-14 DIAGNOSIS — M9906 Segmental and somatic dysfunction of lower extremity: Secondary | ICD-10-CM | POA: Diagnosis not present

## 2023-03-14 DIAGNOSIS — M5136 Other intervertebral disc degeneration, lumbar region: Secondary | ICD-10-CM | POA: Diagnosis not present

## 2023-03-27 DIAGNOSIS — L570 Actinic keratosis: Secondary | ICD-10-CM | POA: Diagnosis not present

## 2023-03-27 DIAGNOSIS — L820 Inflamed seborrheic keratosis: Secondary | ICD-10-CM | POA: Diagnosis not present

## 2023-03-27 DIAGNOSIS — M0579 Rheumatoid arthritis with rheumatoid factor of multiple sites without organ or systems involvement: Secondary | ICD-10-CM | POA: Diagnosis not present

## 2023-03-27 DIAGNOSIS — X32XXXD Exposure to sunlight, subsequent encounter: Secondary | ICD-10-CM | POA: Diagnosis not present

## 2023-03-28 DIAGNOSIS — M5136 Other intervertebral disc degeneration, lumbar region: Secondary | ICD-10-CM | POA: Diagnosis not present

## 2023-03-28 DIAGNOSIS — M9903 Segmental and somatic dysfunction of lumbar region: Secondary | ICD-10-CM | POA: Diagnosis not present

## 2023-03-28 DIAGNOSIS — M9905 Segmental and somatic dysfunction of pelvic region: Secondary | ICD-10-CM | POA: Diagnosis not present

## 2023-03-28 DIAGNOSIS — M9906 Segmental and somatic dysfunction of lower extremity: Secondary | ICD-10-CM | POA: Diagnosis not present

## 2023-04-04 DIAGNOSIS — M5136 Other intervertebral disc degeneration, lumbar region: Secondary | ICD-10-CM | POA: Diagnosis not present

## 2023-04-04 DIAGNOSIS — M9903 Segmental and somatic dysfunction of lumbar region: Secondary | ICD-10-CM | POA: Diagnosis not present

## 2023-04-04 DIAGNOSIS — M9905 Segmental and somatic dysfunction of pelvic region: Secondary | ICD-10-CM | POA: Diagnosis not present

## 2023-04-04 DIAGNOSIS — M9906 Segmental and somatic dysfunction of lower extremity: Secondary | ICD-10-CM | POA: Diagnosis not present

## 2023-04-10 DIAGNOSIS — M9905 Segmental and somatic dysfunction of pelvic region: Secondary | ICD-10-CM | POA: Diagnosis not present

## 2023-04-10 DIAGNOSIS — M9903 Segmental and somatic dysfunction of lumbar region: Secondary | ICD-10-CM | POA: Diagnosis not present

## 2023-04-10 DIAGNOSIS — M5136 Other intervertebral disc degeneration, lumbar region: Secondary | ICD-10-CM | POA: Diagnosis not present

## 2023-04-10 DIAGNOSIS — M9906 Segmental and somatic dysfunction of lower extremity: Secondary | ICD-10-CM | POA: Diagnosis not present

## 2023-04-19 DIAGNOSIS — M9906 Segmental and somatic dysfunction of lower extremity: Secondary | ICD-10-CM | POA: Diagnosis not present

## 2023-04-19 DIAGNOSIS — M5136 Other intervertebral disc degeneration, lumbar region: Secondary | ICD-10-CM | POA: Diagnosis not present

## 2023-04-19 DIAGNOSIS — M9903 Segmental and somatic dysfunction of lumbar region: Secondary | ICD-10-CM | POA: Diagnosis not present

## 2023-04-19 DIAGNOSIS — M9905 Segmental and somatic dysfunction of pelvic region: Secondary | ICD-10-CM | POA: Diagnosis not present

## 2023-04-20 DIAGNOSIS — I10 Essential (primary) hypertension: Secondary | ICD-10-CM | POA: Diagnosis not present

## 2023-04-24 DIAGNOSIS — M797 Fibromyalgia: Secondary | ICD-10-CM | POA: Diagnosis not present

## 2023-04-24 DIAGNOSIS — I10 Essential (primary) hypertension: Secondary | ICD-10-CM | POA: Diagnosis not present

## 2023-04-24 DIAGNOSIS — N1831 Chronic kidney disease, stage 3a: Secondary | ICD-10-CM | POA: Diagnosis not present

## 2023-04-24 DIAGNOSIS — G5600 Carpal tunnel syndrome, unspecified upper limb: Secondary | ICD-10-CM | POA: Diagnosis not present

## 2023-04-24 DIAGNOSIS — M79641 Pain in right hand: Secondary | ICD-10-CM | POA: Diagnosis not present

## 2023-04-24 DIAGNOSIS — Z79899 Other long term (current) drug therapy: Secondary | ICD-10-CM | POA: Diagnosis not present

## 2023-04-24 DIAGNOSIS — Z1231 Encounter for screening mammogram for malignant neoplasm of breast: Secondary | ICD-10-CM | POA: Diagnosis not present

## 2023-04-24 DIAGNOSIS — M25562 Pain in left knee: Secondary | ICD-10-CM | POA: Diagnosis not present

## 2023-04-24 DIAGNOSIS — R768 Other specified abnormal immunological findings in serum: Secondary | ICD-10-CM | POA: Diagnosis not present

## 2023-04-24 DIAGNOSIS — M1990 Unspecified osteoarthritis, unspecified site: Secondary | ICD-10-CM | POA: Diagnosis not present

## 2023-04-24 DIAGNOSIS — M0579 Rheumatoid arthritis with rheumatoid factor of multiple sites without organ or systems involvement: Secondary | ICD-10-CM | POA: Diagnosis not present

## 2023-04-24 DIAGNOSIS — R351 Nocturia: Secondary | ICD-10-CM | POA: Diagnosis not present

## 2023-04-24 DIAGNOSIS — M7989 Other specified soft tissue disorders: Secondary | ICD-10-CM | POA: Diagnosis not present

## 2023-05-03 DIAGNOSIS — N1831 Chronic kidney disease, stage 3a: Secondary | ICD-10-CM | POA: Diagnosis not present

## 2023-05-03 DIAGNOSIS — M9905 Segmental and somatic dysfunction of pelvic region: Secondary | ICD-10-CM | POA: Diagnosis not present

## 2023-05-03 DIAGNOSIS — M9903 Segmental and somatic dysfunction of lumbar region: Secondary | ICD-10-CM | POA: Diagnosis not present

## 2023-05-03 DIAGNOSIS — M9906 Segmental and somatic dysfunction of lower extremity: Secondary | ICD-10-CM | POA: Diagnosis not present

## 2023-05-10 ENCOUNTER — Ambulatory Visit (HOSPITAL_COMMUNITY)
Admission: RE | Admit: 2023-05-10 | Discharge: 2023-05-10 | Disposition: A | Discharge status: Home / Self Care | Payer: Medicare Other | Source: Ambulatory Visit | Attending: Internal Medicine | Admitting: Internal Medicine

## 2023-05-10 ENCOUNTER — Other Ambulatory Visit: Payer: Medicare Other

## 2023-05-10 DIAGNOSIS — I739 Peripheral vascular disease, unspecified: Secondary | ICD-10-CM | POA: Insufficient documentation

## 2023-05-10 LAB — VAS US ABI WITH/WO TBI
Left ABI: 0.7
Right ABI: 0.79

## 2023-05-10 NOTE — Progress Notes (Signed)
Let her know overall the study is stable and I will be seeing her in Jan, unless she is having problems with vascular issues

## 2023-05-16 DIAGNOSIS — K219 Gastro-esophageal reflux disease without esophagitis: Secondary | ICD-10-CM | POA: Diagnosis not present

## 2023-05-16 DIAGNOSIS — E875 Hyperkalemia: Secondary | ICD-10-CM | POA: Diagnosis not present

## 2023-05-16 DIAGNOSIS — I10 Essential (primary) hypertension: Secondary | ICD-10-CM | POA: Diagnosis not present

## 2023-05-22 DIAGNOSIS — M0579 Rheumatoid arthritis with rheumatoid factor of multiple sites without organ or systems involvement: Secondary | ICD-10-CM | POA: Diagnosis not present

## 2023-05-23 DIAGNOSIS — N1831 Chronic kidney disease, stage 3a: Secondary | ICD-10-CM | POA: Diagnosis not present

## 2023-05-23 DIAGNOSIS — I1A0 Resistant hypertension: Secondary | ICD-10-CM | POA: Diagnosis not present

## 2023-05-23 DIAGNOSIS — I10 Essential (primary) hypertension: Secondary | ICD-10-CM | POA: Diagnosis not present

## 2023-05-24 DIAGNOSIS — I10 Essential (primary) hypertension: Secondary | ICD-10-CM | POA: Diagnosis not present

## 2023-05-25 DIAGNOSIS — E876 Hypokalemia: Secondary | ICD-10-CM | POA: Diagnosis not present

## 2023-05-25 DIAGNOSIS — I10 Essential (primary) hypertension: Secondary | ICD-10-CM | POA: Diagnosis not present

## 2023-06-12 DIAGNOSIS — N1832 Chronic kidney disease, stage 3b: Secondary | ICD-10-CM | POA: Diagnosis not present

## 2023-06-19 DIAGNOSIS — M0579 Rheumatoid arthritis with rheumatoid factor of multiple sites without organ or systems involvement: Secondary | ICD-10-CM | POA: Diagnosis not present

## 2023-06-22 DIAGNOSIS — I739 Peripheral vascular disease, unspecified: Secondary | ICD-10-CM | POA: Diagnosis not present

## 2023-06-22 DIAGNOSIS — E876 Hypokalemia: Secondary | ICD-10-CM | POA: Diagnosis not present

## 2023-06-22 DIAGNOSIS — N1832 Chronic kidney disease, stage 3b: Secondary | ICD-10-CM | POA: Diagnosis not present

## 2023-06-22 DIAGNOSIS — E673 Hypervitaminosis D: Secondary | ICD-10-CM | POA: Diagnosis not present

## 2023-06-22 DIAGNOSIS — I129 Hypertensive chronic kidney disease with stage 1 through stage 4 chronic kidney disease, or unspecified chronic kidney disease: Secondary | ICD-10-CM | POA: Diagnosis not present

## 2023-06-29 DIAGNOSIS — E876 Hypokalemia: Secondary | ICD-10-CM | POA: Diagnosis not present

## 2023-07-10 DIAGNOSIS — R197 Diarrhea, unspecified: Secondary | ICD-10-CM | POA: Diagnosis not present

## 2023-07-16 DIAGNOSIS — H04123 Dry eye syndrome of bilateral lacrimal glands: Secondary | ICD-10-CM | POA: Diagnosis not present

## 2023-07-17 DIAGNOSIS — M0579 Rheumatoid arthritis with rheumatoid factor of multiple sites without organ or systems involvement: Secondary | ICD-10-CM | POA: Diagnosis not present

## 2023-07-30 ENCOUNTER — Other Ambulatory Visit: Payer: Self-pay | Admitting: Internal Medicine

## 2023-07-30 DIAGNOSIS — E876 Hypokalemia: Secondary | ICD-10-CM | POA: Diagnosis not present

## 2023-07-30 DIAGNOSIS — I129 Hypertensive chronic kidney disease with stage 1 through stage 4 chronic kidney disease, or unspecified chronic kidney disease: Secondary | ICD-10-CM | POA: Diagnosis not present

## 2023-07-30 DIAGNOSIS — N1832 Chronic kidney disease, stage 3b: Secondary | ICD-10-CM | POA: Diagnosis not present

## 2023-07-30 DIAGNOSIS — I10 Essential (primary) hypertension: Secondary | ICD-10-CM | POA: Diagnosis not present

## 2023-07-30 DIAGNOSIS — F321 Major depressive disorder, single episode, moderate: Secondary | ICD-10-CM | POA: Diagnosis not present

## 2023-07-30 DIAGNOSIS — G5600 Carpal tunnel syndrome, unspecified upper limb: Secondary | ICD-10-CM | POA: Diagnosis not present

## 2023-07-30 DIAGNOSIS — K7460 Unspecified cirrhosis of liver: Secondary | ICD-10-CM | POA: Diagnosis not present

## 2023-07-31 DIAGNOSIS — M0579 Rheumatoid arthritis with rheumatoid factor of multiple sites without organ or systems involvement: Secondary | ICD-10-CM | POA: Diagnosis not present

## 2023-07-31 DIAGNOSIS — Z79899 Other long term (current) drug therapy: Secondary | ICD-10-CM | POA: Diagnosis not present

## 2023-07-31 DIAGNOSIS — M79641 Pain in right hand: Secondary | ICD-10-CM | POA: Diagnosis not present

## 2023-07-31 DIAGNOSIS — M797 Fibromyalgia: Secondary | ICD-10-CM | POA: Diagnosis not present

## 2023-07-31 DIAGNOSIS — G5600 Carpal tunnel syndrome, unspecified upper limb: Secondary | ICD-10-CM | POA: Diagnosis not present

## 2023-07-31 DIAGNOSIS — M1990 Unspecified osteoarthritis, unspecified site: Secondary | ICD-10-CM | POA: Diagnosis not present

## 2023-07-31 DIAGNOSIS — N1832 Chronic kidney disease, stage 3b: Secondary | ICD-10-CM | POA: Diagnosis not present

## 2023-07-31 DIAGNOSIS — M25569 Pain in unspecified knee: Secondary | ICD-10-CM | POA: Diagnosis not present

## 2023-07-31 DIAGNOSIS — R768 Other specified abnormal immunological findings in serum: Secondary | ICD-10-CM | POA: Diagnosis not present

## 2023-08-01 DIAGNOSIS — I2510 Atherosclerotic heart disease of native coronary artery without angina pectoris: Secondary | ICD-10-CM

## 2023-08-01 HISTORY — DX: Atherosclerotic heart disease of native coronary artery without angina pectoris: I25.10

## 2023-08-02 DIAGNOSIS — N1832 Chronic kidney disease, stage 3b: Secondary | ICD-10-CM | POA: Diagnosis not present

## 2023-08-02 DIAGNOSIS — M0579 Rheumatoid arthritis with rheumatoid factor of multiple sites without organ or systems involvement: Secondary | ICD-10-CM | POA: Diagnosis not present

## 2023-08-03 DIAGNOSIS — H04123 Dry eye syndrome of bilateral lacrimal glands: Secondary | ICD-10-CM | POA: Diagnosis not present

## 2023-08-03 DIAGNOSIS — Z961 Presence of intraocular lens: Secondary | ICD-10-CM | POA: Diagnosis not present

## 2023-08-07 ENCOUNTER — Other Ambulatory Visit: Payer: Medicare Other

## 2023-08-07 DIAGNOSIS — N1832 Chronic kidney disease, stage 3b: Secondary | ICD-10-CM | POA: Diagnosis not present

## 2023-08-07 DIAGNOSIS — E876 Hypokalemia: Secondary | ICD-10-CM | POA: Diagnosis not present

## 2023-08-07 DIAGNOSIS — E673 Hypervitaminosis D: Secondary | ICD-10-CM | POA: Diagnosis not present

## 2023-08-07 DIAGNOSIS — I739 Peripheral vascular disease, unspecified: Secondary | ICD-10-CM | POA: Diagnosis not present

## 2023-08-07 DIAGNOSIS — I129 Hypertensive chronic kidney disease with stage 1 through stage 4 chronic kidney disease, or unspecified chronic kidney disease: Secondary | ICD-10-CM | POA: Diagnosis not present

## 2023-08-09 ENCOUNTER — Ambulatory Visit: Payer: Self-pay | Admitting: Cardiology

## 2023-08-10 ENCOUNTER — Ambulatory Visit
Admission: RE | Admit: 2023-08-10 | Discharge: 2023-08-10 | Disposition: A | Discharge status: Home / Self Care | Payer: Medicare Other | Source: Ambulatory Visit | Attending: Internal Medicine | Admitting: Internal Medicine

## 2023-08-10 DIAGNOSIS — K7460 Unspecified cirrhosis of liver: Secondary | ICD-10-CM

## 2023-08-14 ENCOUNTER — Observation Stay (HOSPITAL_BASED_OUTPATIENT_CLINIC_OR_DEPARTMENT_OTHER)
Admission: EM | Admit: 2023-08-14 | Discharge: 2023-08-15 | Disposition: A | Discharge status: Home / Self Care | Payer: Medicare Other | Attending: Family Medicine | Admitting: Family Medicine

## 2023-08-14 ENCOUNTER — Other Ambulatory Visit: Payer: Self-pay

## 2023-08-14 ENCOUNTER — Emergency Department (HOSPITAL_BASED_OUTPATIENT_CLINIC_OR_DEPARTMENT_OTHER): Payer: Medicare Other

## 2023-08-14 DIAGNOSIS — R4702 Dysphasia: Secondary | ICD-10-CM | POA: Diagnosis present

## 2023-08-14 DIAGNOSIS — Z79899 Other long term (current) drug therapy: Secondary | ICD-10-CM | POA: Diagnosis not present

## 2023-08-14 DIAGNOSIS — R29818 Other symptoms and signs involving the nervous system: Secondary | ICD-10-CM | POA: Diagnosis not present

## 2023-08-14 DIAGNOSIS — I10 Essential (primary) hypertension: Secondary | ICD-10-CM | POA: Diagnosis present

## 2023-08-14 DIAGNOSIS — R4701 Aphasia: Secondary | ICD-10-CM | POA: Diagnosis not present

## 2023-08-14 DIAGNOSIS — I129 Hypertensive chronic kidney disease with stage 1 through stage 4 chronic kidney disease, or unspecified chronic kidney disease: Secondary | ICD-10-CM | POA: Insufficient documentation

## 2023-08-14 DIAGNOSIS — I6782 Cerebral ischemia: Secondary | ICD-10-CM | POA: Diagnosis not present

## 2023-08-14 DIAGNOSIS — R479 Unspecified speech disturbances: Secondary | ICD-10-CM | POA: Insufficient documentation

## 2023-08-14 DIAGNOSIS — Z7982 Long term (current) use of aspirin: Secondary | ICD-10-CM | POA: Diagnosis not present

## 2023-08-14 DIAGNOSIS — Z85828 Personal history of other malignant neoplasm of skin: Secondary | ICD-10-CM | POA: Diagnosis not present

## 2023-08-14 DIAGNOSIS — I739 Peripheral vascular disease, unspecified: Secondary | ICD-10-CM | POA: Diagnosis not present

## 2023-08-14 DIAGNOSIS — Z87891 Personal history of nicotine dependence: Secondary | ICD-10-CM | POA: Insufficient documentation

## 2023-08-14 DIAGNOSIS — G459 Transient cerebral ischemic attack, unspecified: Secondary | ICD-10-CM | POA: Diagnosis not present

## 2023-08-14 DIAGNOSIS — N1832 Chronic kidney disease, stage 3b: Secondary | ICD-10-CM | POA: Insufficient documentation

## 2023-08-14 DIAGNOSIS — J449 Chronic obstructive pulmonary disease, unspecified: Secondary | ICD-10-CM | POA: Insufficient documentation

## 2023-08-14 DIAGNOSIS — Z96641 Presence of right artificial hip joint: Secondary | ICD-10-CM | POA: Diagnosis not present

## 2023-08-14 DIAGNOSIS — R4781 Slurred speech: Secondary | ICD-10-CM | POA: Diagnosis not present

## 2023-08-14 DIAGNOSIS — Z8616 Personal history of COVID-19: Secondary | ICD-10-CM | POA: Diagnosis not present

## 2023-08-14 LAB — CBC
HCT: 35.1 % — ABNORMAL LOW (ref 36.0–46.0)
Hemoglobin: 11.8 g/dL — ABNORMAL LOW (ref 12.0–15.0)
MCH: 30.9 pg (ref 26.0–34.0)
MCHC: 33.6 g/dL (ref 30.0–36.0)
MCV: 91.9 fL (ref 80.0–100.0)
Platelets: 198 10*3/uL (ref 150–400)
RBC: 3.82 MIL/uL — ABNORMAL LOW (ref 3.87–5.11)
RDW: 13.8 % (ref 11.5–15.5)
WBC: 6.8 10*3/uL (ref 4.0–10.5)
nRBC: 0 % (ref 0.0–0.2)

## 2023-08-14 LAB — DIFFERENTIAL
Abs Immature Granulocytes: 0.01 10*3/uL (ref 0.00–0.07)
Basophils Absolute: 0.1 10*3/uL (ref 0.0–0.1)
Basophils Relative: 1 %
Eosinophils Absolute: 0.3 10*3/uL (ref 0.0–0.5)
Eosinophils Relative: 4 %
Immature Granulocytes: 0 %
Lymphocytes Relative: 28 %
Lymphs Abs: 1.9 10*3/uL (ref 0.7–4.0)
Monocytes Absolute: 1.1 10*3/uL — ABNORMAL HIGH (ref 0.1–1.0)
Monocytes Relative: 17 %
Neutro Abs: 3.4 10*3/uL (ref 1.7–7.7)
Neutrophils Relative %: 50 %

## 2023-08-14 LAB — CBG MONITORING, ED: Glucose-Capillary: 100 mg/dL — ABNORMAL HIGH (ref 70–99)

## 2023-08-14 LAB — COMPREHENSIVE METABOLIC PANEL
ALT: 21 U/L (ref 0–44)
AST: 19 U/L (ref 15–41)
Albumin: 4.1 g/dL (ref 3.5–5.0)
Alkaline Phosphatase: 40 U/L (ref 38–126)
Anion gap: 10 (ref 5–15)
BUN: 49 mg/dL — ABNORMAL HIGH (ref 8–23)
CO2: 23 mmol/L (ref 22–32)
Calcium: 9.3 mg/dL (ref 8.9–10.3)
Chloride: 102 mmol/L (ref 98–111)
Creatinine, Ser: 1.84 mg/dL — ABNORMAL HIGH (ref 0.44–1.00)
GFR, Estimated: 28 mL/min — ABNORMAL LOW (ref >60)
Glucose, Bld: 101 mg/dL — ABNORMAL HIGH (ref 70–99)
Potassium: 4.5 mmol/L (ref 3.5–5.1)
Sodium: 135 mmol/L (ref 135–145)
Total Bilirubin: 0.5 mg/dL (ref 0.0–1.2)
Total Protein: 6.7 g/dL (ref 6.5–8.1)

## 2023-08-14 LAB — ETHANOL: Alcohol, Ethyl (B): <10 mg/dL (ref <10)

## 2023-08-14 LAB — PROTIME-INR
INR: 0.9 (ref 0.8–1.2)
Prothrombin Time: 12.5 s (ref 11.4–15.2)

## 2023-08-14 LAB — APTT: aPTT: 29 s (ref 24–36)

## 2023-08-14 MED ORDER — ACETAMINOPHEN 500 MG PO TABS
1000.0000 mg | ORAL_TABLET | Freq: Four times a day (QID) | ORAL | Status: DC | PRN
  Administered 2023-08-15: 1000 mg via ORAL
  Filled 2023-08-14: qty 2

## 2023-08-14 MED ORDER — CLOPIDOGREL BISULFATE 300 MG PO TABS
300.0000 mg | ORAL_TABLET | Freq: Once | ORAL | Status: AC
  Administered 2023-08-14: 300 mg via ORAL
  Filled 2023-08-14: qty 1

## 2023-08-14 MED ORDER — ROSUVASTATIN CALCIUM 5 MG PO TABS
5.0000 mg | ORAL_TABLET | Freq: Every day | ORAL | Status: DC
  Administered 2023-08-15: 5 mg via ORAL
  Filled 2023-08-14: qty 1

## 2023-08-14 MED ORDER — SODIUM CHLORIDE 0.9% FLUSH: 3.0000 mL | Freq: Once | INTRAVENOUS | Status: DC

## 2023-08-14 MED ORDER — SODIUM CHLORIDE 0.9 % IV BOLUS
500.0000 mL | Freq: Once | INTRAVENOUS | Status: AC
  Administered 2023-08-14: 500 mL via INTRAVENOUS

## 2023-08-14 MED ORDER — ASPIRIN 81 MG PO CHEW
324.0000 mg | CHEWABLE_TABLET | Freq: Once | ORAL | Status: AC
  Administered 2023-08-14: 324 mg via ORAL
  Filled 2023-08-14: qty 4

## 2023-08-14 MED ORDER — ALBUTEROL SULFATE HFA 108 (90 BASE) MCG/ACT IN AERS: 2.0000 | INHALATION_SPRAY | RESPIRATORY_TRACT | Status: DC | PRN

## 2023-08-14 MED ORDER — SODIUM CHLORIDE 0.9 % IV SOLN: Freq: Once | INTRAVENOUS | Status: AC

## 2023-08-14 MED ORDER — IPRATROPIUM BROMIDE 0.06 % NA SOLN
1.0000 | Freq: Every day | NASAL | Status: DC
  Administered 2023-08-15: 1 via NASAL
  Filled 2023-08-14: qty 15

## 2023-08-14 MED ORDER — ASPIRIN 81 MG PO TBEC
81.0000 mg | DELAYED_RELEASE_TABLET | Freq: Every day | ORAL | Status: DC
Start: 2023-08-15 — End: 2023-08-15
  Administered 2023-08-15: 81 mg via ORAL
  Filled 2023-08-14: qty 1

## 2023-08-14 MED ORDER — CLOPIDOGREL BISULFATE 75 MG PO TABS
75.0000 mg | ORAL_TABLET | Freq: Every day | ORAL | Status: DC
Start: 2023-08-15 — End: 2023-08-15
  Administered 2023-08-15: 75 mg via ORAL
  Filled 2023-08-14: qty 1

## 2023-08-14 MED ORDER — ALBUTEROL SULFATE (2.5 MG/3ML) 0.083% IN NEBU: 2.5000 mg | INHALATION_SOLUTION | RESPIRATORY_TRACT | Status: DC | PRN

## 2023-08-14 NOTE — ED Notes (Addendum)
 Time to decision per neuro: Code stroke clear, no TNK, no additional angio CT at this time, advises to admit for further testing.

## 2023-08-14 NOTE — ED Notes (Signed)
 Report called over to [facility] 3[facility] and given to [place], [place]

## 2023-08-14 NOTE — Progress Notes (Signed)
 Code Stroke activated @ 1144.  To CT @ 1150.  Dr. Selina Cooley paged @ 1151 and on camera in CT @ 1157.  Josiah Lobo BSN, Occupational hygienist

## 2023-08-14 NOTE — Consult Note (Signed)
 NEUROLOGY TELECONSULTATION NOTE   Date of service: August 14, 2023 Patient Name: Donna [name] MRN:  [id number] DOB:  [birth year] Reason for consult: transient aphasia  Requesting Provider: Dr. Richerd Later Consult Participants: myself, patient, bedside RN, telestroke RN Location of the provider: [facility]  Location of the patient: [facility]  This consult was provided via telemedicine with 2-way video and audio communication. The patient/family was informed that care would be provided in this way and agreed to receive care in this manner.   _ _ _   _ __   _ __ _ _  __ __   _ __   __ _  History of Present Illness   Donna [name] is a 78 y.o. female with PMH significant for  has a past medical history of Arthritis, Cancer (HCC), Chronic kidney disease, COPD (chronic obstructive pulmonary disease) (HCC), COVID, Depression, GERD (gastroesophageal reflux disease), H/O echocardiogram, Hiatal hernia, History of hiatal hernia, Hyperlipidemia, Hypertension, Hypertension, Peripheral vascular disease (HCC), and Seasonal allergies. who presents with  transient aphasia. LKW 1100 after which she developed word finding difficulty. Sx resolved before she reached the ED. Stroke code was activated in ED. NIHSS = 0. CT head no acute findings on personal review. TNK not administered 2/2 resolution of sx.   ROS   Per HPI; all other systems reviewed and are negative  Past History   The following was personally reviewed:  Past Medical History:  Diagnosis Date   Arthritis    lumbar, toes, L knee, cerv. spine & hands  - rheumatoid   Cancer (HCC)    basal cell removed fr. 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 WENDI Clara, but prev. saw Dr. Ladona   Peripheral vascular disease [facility])    Seasonal allergies    Past Surgical History:  Procedure Laterality Date   ABDOMINAL AORTOGRAM W/LOWER EXTREMITY N/A 05/30/2022   Procedure: ABDOMINAL AORTOGRAM W/LOWER EXTREMITY;  Surgeon: [name], MD;  Location: [facility] INVASIVE CV LAB;  Service: Cardiovascular;  Laterality: N/A;   APPLICATION OF WOUND VAC Left 07/05/2022   Procedure: APPLICATION OF WOUND VAC;  Surgeon: Serene Gaile ORN, MD;  Location: [facility] OR;  Service: Vascular;  Laterality: Left;   APPLICATION OF WOUND VAC Left 07/05/2022   Procedure: APPLICATION OF WOUND VAC;  Surgeon: Serene Gaile ORN, MD;  Location: [facility] OR;  Service: Vascular;  Laterality: Left;   BACK SURGERY  07/31/1992   cerv. spine fusion    BREAST ENHANCEMENT SURGERY     BREAST SURGERY  04/01/1979   implants- bilateral    CATARACT EXTRACTION Left 12/2022   CERVICAL SPINE SURGERY  07/31/1990   fusion   ENDARTERECTOMY FEMORAL Left 06/02/2022   Procedure: LEFT ENDARTERECTOMY FEMORAL with BOVINE PATCH;  Surgeon: Serene Gaile ORN, MD;  Location: [facility] OR;  Service: Vascular;  Laterality: Left;   GROIN DEBRIDEMENT Left 07/05/2022   Procedure: LEFT GROIN WASHOUT WITH STIMULAN ANTIBIOTIC BEAD AND KERECIS FISH SKIN GRAFT APPLICATION;  Surgeon: Serene Gaile ORN, MD;  Location:  [facility] OR;  Service: Vascular;  Laterality: Left;   INCISION AND DRAINAGE Left 07/05/2022   Procedure: LEFT GROIN WASHOUT;  Surgeon: Serene Gaile ORN, MD;  Location[facility] OR;  Service: Vascular;  Laterality: Left;   JOINT REPLACEMENT Right 07/31/2005   LOWER EXTREMITY ANGIOGRAPHY N/A 01/26/2023   Procedure: Lower Extremity Angiography;  Surgeon: [name], MD;  Location: [facility] INVASIVE CV LAB;  Service: Cardiovascular;  Laterality: N/A;   LUMBAR LAMINECTOMY/DECOMPRESSION MICRODISCECTOMY Left 10/07/2015   Procedure: Lumbar three-five Decompressive  lumbar Laminectomy & Left Lumbar four-five Microdiscectomy;  Surgeon: Lamar Peaches, MD;  Location: [facility] NEURO ORS;  Service: Neurosurgery;  Laterality: Left;   LUMBAR SPINE SURGERY  2016   PATCH ANGIOPLASTY Left 07/05/2022   Procedure: VEIN PATCH ANGIOPLASTY;  Surgeon: Serene Gaile ORN, MD;  Location: [facility] OR;  Service: Vascular;  Laterality: Left;   TONSILLECTOMY     TOTAL HIP ARTHROPLASTY  07/31/2005   Right   TUBAL LIGATION     Family History  Problem Relation Age of Onset   Kidney cancer Mother    Lung cancer Sister    Heart disease Maternal Aunt    Heart disease Maternal Aunt    Kidney cancer Maternal Aunt    Kidney cancer Maternal Uncle    Social History   Socioeconomic History   Marital status: Married    Spouse name: now divorced   Number of children: Y   Years of education: Not on file   Highest education level: Not on file  Occupational History   Occupation: media planner    Comment: retired  Tobacco Use   Smoking status: Former    Current packs/day: 0.00    Average packs/day: 0.7 packs/day for 24.0 years (16.8 ttl pk-yrs)    Types: Cigarettes    Start date: 07/31/1985    Quit date: 07/31/2009    Years since quitting: 14.0   Smokeless tobacco: Former    Quit date: 10/01/1998  Vaping Use   Vaping status: Never Used  Substance and Sexual Activity   Alcohol use: Yes    Comment: wine- rarely   Drug use: No   Sexual activity: Not on file  Other Topics Concern   Not on file  Social History Narrative   ** Merged History Encounter **       Pt is divorced and now lives with [employer].    Social Drivers of Corporate Investment Banker Strain: Not on file  Food Insecurity: No Food Insecurity (07/06/2022)   Hunger Vital Sign    Worried About Running Out of Food in the Last Year: Never true    Ran Out of Food in the Last Year: Never true  Transportation Needs: No Transportation Needs (07/06/2022)   PRAPARE - Administrator, Civil Service (Medical):  No    Lack of Transportation (Non-Medical): No  Physical Activity: Not on file  Stress: Not on file  Social Connections: Not on file   Allergies  Allergen Reactions   Thorazine [Chlorpromazine] Anaphylaxis    Medications   (Not in a hospital admission)     Current Facility-Administered Medications:    sodium chloride  flush (NS) 0.9 % injection 3 mL, 3 mL, Intravenous, Once, Kingsley, Victoria K, DO  Current Outpatient Medications:    acetaminophen  (TYLENOL ) 500 MG tablet, Take 1,000 mg by mouth every 6 (six) hours as needed for moderate pain or mild pain., Disp: , Rfl:    albuterol  (PROVENTIL  HFA;VENTOLIN  HFA) 108 (90 Base) MCG/ACT inhaler, Inhale  2 puffs into the lungs every 4 (four) hours as needed for wheezing or shortness of breath., Disp: , Rfl:    amLODipine  (NORVASC ) 10 MG tablet, Take 1 tablet (10 mg total) by mouth daily., Disp: 90 tablet, Rfl: 3   aspirin  (ASPIRIN  CHILDRENS) 81 MG chewable tablet, Chew 1 tablet (81 mg total) by mouth daily., Disp: , Rfl:    bisoprolol  (ZEBETA ) 5 MG tablet, TAKE 1 TABLET BY MOUTH DAILY, Disp: 90 tablet, Rfl: 3   buPROPion  (WELLBUTRIN  XL) 150 MG 24 hr tablet, Take 150 mg by mouth in the morning. 150 mg +300 mg=450 mg, Disp: , Rfl:    buPROPion  (WELLBUTRIN  XL) 300 MG 24 hr tablet, Take 300 mg by mouth daily. 300 mg +150 mg=450 mg, Disp: , Rfl:    certolizumab pegol  (CIMZIA ) 2 X 200 MG KIT, Inject into the skin every 30 (thirty) days., Disp: , Rfl:    DULoxetine  (CYMBALTA ) 20 MG capsule, Take 20 mg by mouth daily., Disp: , Rfl:    famotidine  (PEPCID ) 20 MG tablet, Take 20 mg by mouth 2 (two) times daily., Disp: , Rfl:    hydroxychloroquine  (PLAQUENIL ) 200 MG tablet, Take 200 mg by mouth in the morning., Disp: , Rfl:    ipratropium (ATROVENT ) 0.06 % nasal spray, Place 1 spray into both nostrils in the morning., Disp: 15 mL, Rfl: 11   leflunomide  (ARAVA ) 20 MG tablet, Take 20 mg by mouth in the morning., Disp: , Rfl:    Multiple  Vitamins-Minerals (MULTIVITAMIN WOMEN) TABS, Take 1 tablet by mouth in the morning., Disp: , Rfl:    rosuvastatin  (CRESTOR ) 5 MG tablet, Take 5 mg by mouth in the morning., Disp: , Rfl:   Vitals   There were no vitals filed for this visit.   There is no height or weight on file to calculate BMI.  Physical Exam   Exam performed over telemedicine with 2-way video and audio communication and with assistance of bedside RN  Physical Exam Gen: A&O x4, NAD Resp: normal WOB CV: extremities appear well-perfused  Neuro: *MS: A&O x4. Follows multi-step commands.  *Speech: nondysarthric, no aphasia, able to name and repeat *CN: PERRL 3mm, EOMI, VFF by confrontation, sensation intact, smile symmetric, hearing intact to voice *Motor:   Normal bulk.  No tremor, rigidity or bradykinesia. No pronator drift. All extremities appear full-strength and symmetric. *Sensory: SILT. Symmetric. No double-simultaneous extinction.  *Coordination:  Finger-to-nose, heel-to-shin, rapid alternating motions were intact. *Reflexes:  UTA 2/2 tele-exam *Gait: deferred  NIHSS = 0  Premorbid mRS = 0   Labs   CBC: No results for input(s): WBC, NEUTROABS, HGB, HCT, MCV, PLT in the last 168 hours.  Basic Metabolic Panel:  Lab Results  Component Value Date   NA 140 01/26/2023   K 3.6 01/26/2023   CO2 22 01/16/2023   GLUCOSE 92 01/26/2023   BUN 24 (H) 01/26/2023   CREATININE 1.40 (H) 01/26/2023   CALCIUM  9.7 01/16/2023   GFRNONAA 52 (L) 07/06/2022   GFRAA >60 10/01/2015   Lipid Panel:  Lab Results  Component Value Date   LDLCALC 48 06/02/2022   HgbA1c: No results found for: HGBA1C Urine Drug Screen: No results found for: LABOPIA, COCAINSCRNUR, LABBENZ, AMPHETMU, THCU, LABBARB  Alcohol Level No results found for: ETH  CT Head without contrast: No acute process on personal review  Impression   Donna [name] is a 78 y.o. female with PMH significant for  has a past medical  history of Arthritis, Cancer (HCC),  Chronic kidney disease, COPD (chronic obstructive pulmonary disease) (HCC), COVID, Depression, GERD (gastroesophageal reflux disease), H/O echocardiogram, Hiatal hernia, History of hiatal hernia, Hyperlipidemia, Hypertension, Hypertension, Peripheral vascular disease (HCC), and Seasonal allergies. who presents with  transient aphasia. TNK not administered 2/2 resolution of sx.  Recommendations   - Admit to [facility] for stroke workup - Permissive HTN x48 hrs from sx onset or until stroke ruled out by MRI goal BP <220/120. PRN labetalol  or hydralazine  if BP above these parameters. Avoid oral antihypertensives. - MRI brain wo contrast - CTA/MRA if not already obtained - TTE  - Check A1c and LDL + add statin per guidelines - ASA 81mg  daily + plavix  75mg  daily x21 days f/b ASA 81mg  daily monotherapy after that - q4 hr neuro checks - STAT head CT for any change in neuro exam - Tele - PT/OT/SLP - Stroke education - Amb referral to neurology upon discharge   Please notify neurology upon patient's arrival to [facility], we will follow there  ______________________________________________________________________   Thank you for the opportunity to take part in the care of this patient. If you have any further questions, please contact the neurology consultation attending.  Signed,  Elida Ross, MD [facility] [phone]  If 7pm- 7am, please page neurology on call as listed in AMION.  **Any copied and pasted documentation in this note was written by me in another application not billed for and pasted by me into this document.

## 2023-08-14 NOTE — ED Provider Notes (Signed)
 [place] [facility]GENC[facility] DE[facility]MENT AT [facility] Provider Note   CSN: [id number] Arrival date & time: 08/14/23  1130     History  Chief Complaint  Patient presents with   Aphasia    Donna [name] is a 78 y.o. female.  Patient is a 78 year old female with a past medical history of hypertension, hyperlipidemia, rheumatoid arthritis, PAD, COPD, CKD presenting to the emergency department with slurred speech.  The patient had an appointment this morning for an injection for her rheumatoid arthritis.  His husband reports while they were getting ready for the appointment around 11 AM this morning she was suddenly unable to speak.  He states that it took her several seconds to get any words out and those words came out slurred.  He states that he was concerned that she was having a stroke and immediately drove her to the ER.  The patient reports on her arrival to the ER she feels like her speech has improved.  She states that she does feel dizzy right now but is unable to describe this dizziness.  She denies any associated headache, numbness or weakness.  The history is provided by the patient and the spouse.       Home Medications Prior to Admission medications   Medication Sig Start Date End Date Taking? Authorizing Provider  chlorthalidone (HYGROTON) 25 MG tablet Take 25 mg by mouth daily.   Yes Provider, Historical, [name]  pantoprazole  (PROTONIX ) 40 MG tablet Take 40 mg by mouth daily.   Yes Provider, Historical, [name]  potassium chloride  (MICRO-K ) 10 MEQ CR capsule Take 10 mEq by mouth daily.   Yes Provider, Historical, [name]  acetaminophen  (TYLENOL ) 500 MG tablet Take 1,000 mg by mouth every 6 (six) hours as needed for moderate pain or mild pain.    Provider, Historical, [name]  albuterol  (PROVENTIL  HFA;VENTOLIN  HFA) 108 (90 Base) MCG/ACT inhaler Inhale 2 puffs into the lungs every 4 (four) hours as needed for wheezing or shortness of breath.    Provider, Historical, [name]  amLODipine  (NORVASC )  10 MG tablet Take 1 tablet (10 mg total) by mouth daily. Patient taking differently: Take 5 mg by mouth daily. 01/16/23   Donna [name]  aspirin  (ASPIRIN  CHILDRENS) 81 MG chewable tablet Chew 1 tablet (81 mg total) by mouth daily. 02/06/23   Donna [name]  bisoprolol  (ZEBETA ) 5 MG tablet TAKE 1 TABLET BY MOUTH DAILY Patient taking differently: Take 10 mg by mouth daily. TAKE 1 TABLET BY MOUTH DAILY 03/05/23   Donna [name]  buPROPion  (WELLBUTRIN  XL) 150 MG 24 hr tablet Take 150 mg by mouth in the morning. 150 mg +300 mg=450 mg    Provider, Historical, [name]  buPROPion  (WELLBUTRIN  XL) 300 MG 24 hr tablet Take 300 mg by mouth daily. 300 mg +150 mg=450 mg    Provider, Historical, [name]  certolizumab pegol  (CIMZIA ) 2 X 200 MG KIT Inject into the skin every 30 (thirty) days.    Provider, Historical, [name]  DULoxetine  (CYMBALTA ) 20 MG capsule Take 20 mg by mouth daily.    Provider, Historical, [name]  famotidine  (PEPCID ) 20 MG tablet Take 20 mg by mouth 2 (two) times daily.    Provider, Historical, [name]  hydroxychloroquine  (PLAQUENIL ) 200 MG tablet Take 200 mg by mouth in the morning.    Provider, Historical, [name]  ipratropium (ATROVENT ) 0.06 % nasal spray Place 1 spray into both nostrils in the morning. 02/09/23   Donna [name]  leflunomide  (ARAVA ) 20 MG tablet  Take 20 mg by mouth in the morning. 11/28/19   Provider, Historical, [name]  Multiple Vitamins-Minerals (MULTIVITAMIN WOMEN) TABS Take 1 tablet by mouth in the morning.    Provider, Historical, [name]  rosuvastatin  (CRESTOR ) 5 MG tablet Take 5 mg by mouth in the morning. 04/11/22   Provider, Historical, [name]      Allergies    Thorazine [chlorpromazine]    Review of Systems   Review of Systems  Physical Exam Updated Vital Signs BP 109/70   Pulse 71   Resp 15   SpO2 96%  Physical Exam Vitals and nursing note reviewed.  Constitutional:      General: She is not in acute distress.    Appearance: Normal appearance.  HENT:     Head: Normocephalic and  atraumatic.     Nose: Nose normal.     Mouth/Throat:     Mouth: Mucous membranes are moist.     Pharynx: Oropharynx is clear.  Eyes:     Extraocular Movements: Extraocular movements intact.     Conjunctiva/sclera: Conjunctivae normal.     Pupils: Pupils are equal, round, and reactive to light.  Cardiovascular:     Rate and Rhythm: Normal rate.  Pulmonary:     Effort: Pulmonary effort is normal.  Abdominal:     General: Abdomen is flat.  Musculoskeletal:        General: Normal range of motion.     Cervical back: Normal range of motion.  Skin:    General: Skin is warm and dry.  Neurological:     General: No focal deficit present.     Mental Status: She is alert and oriented to person, place, and time.     Cranial Nerves: No cranial nerve deficit.     Sensory: No sensory deficit.     Motor: No weakness.     Coordination: Coordination normal.     Comments: Speech clear, fluent  Psychiatric:        Mood and Affect: Mood normal.        Behavior: Behavior normal.     ED Results / Procedures / Treatments   Labs (all labs ordered are listed, but only abnormal results are displayed) Labs Reviewed  CBC - Abnormal; Notable for the following components:      Result Value   RBC 3.82 (*)    Hemoglobin 11.8 (*)    HCT 35.1 (*)    All other components within normal limits  DIFFERENTIAL - Abnormal; Notable for the following components:   Monocytes Absolute 1.1 (*)    All other components within normal limits  COMPREHENSIVE METABOLIC PANEL - Abnormal; Notable for the following components:   Glucose, Bld 101 (*)    BUN 49 (*)    Creatinine, Ser 1.84 (*)    GFR, Estimated 28 (*)    All other components within normal limits  CBG MONITORING, ED - Abnormal; Notable for the following components:   Glucose-Capillary 100 (*)    All other components within normal limits  PROTIME-INR  APTT  ETHANOL    EKG EKG Interpretation Date/Time:  Tuesday August 14 2023 11:49:22  EST Ventricular Rate:  71 PR Interval:  155 QRS Duration:  86 QT Interval:  389 QTC Calculation: 423 R Axis:   85  Text Interpretation: Sinus rhythm Borderline right axis deviation Low voltage, precordial leads No significant change since last tracing Confirmed by Ellouise Fine ([id number]) on 08/14/2023 12:05:42 PM  Radiology CT HEAD CODE STROKE WO CONTRAST Result Date: 08/14/2023  CLINICAL DATA:  Code stroke. Neuro deficit, acute, stroke suspected. Slurred speech beginning 1 hour ago. EXAM: CT HEAD WITHOUT CONTRAST TECHNIQUE: Contiguous axial images were obtained from the base of the skull through the vertex without intravenous contrast. RADIATION DOSE REDUCTION: This exam was performed according to the departmental dose-optimization program which includes automated exposure control, adjustment of the mA and/or kV according to patient size and/or use of iterative reconstruction technique. COMPARISON:  07/23/2007 FINDINGS: Brain: Mild age related volume loss. Mild small vessel ischemic change of the hemispheric white matter. No sign of acute infarction, mass lesion, hemorrhage, hydrocephalus or extra-axial collection. Vascular: There is atherosclerotic calcification of the major vessels at the base of the brain. Skull: Negative Sinuses/Orbits: Clear/normal Other: None ASPECTS (Alberta Stroke Program Early CT Score) - Ganglionic level infarction (caudate, lentiform nuclei, internal capsule, insula, M1-M3 cortex): 7 - Supraganglionic infarction (M4-M6 cortex): 3 Total score (0-10 with 10 being normal): 10 IMPRESSION: 1. No acute CT finding. Mild age related volume loss and small vessel ischemic change of the hemispheric white matter. 2. Aspects is 10. These results were called by telephone at the time of interpretation on 08/14/2023 at 12:16 pm to provider [facility] , who verbally acknowledged these results. Electronically Signed   By: Oneil Officer M.D.   On: 08/14/2023 12:17     Procedures Procedures    Medications Ordered in ED Medications  aspirin  chewable tablet 324 mg (has no administration in time range)  clopidogrel  (PLAVIX ) tablet 300 mg (has no administration in time range)  aspirin  EC tablet 81 mg (has no administration in time range)  clopidogrel  (PLAVIX ) tablet 75 mg (has no administration in time range)    ED Course/ Medical Decision Making/ A&P Clinical Course as of 08/14/23 1340  Tue Aug 14, 2023  1206 I spoke with Dr. Matthews, neurology, who evaluated the patient. Symptoms have now resolved with NIH 0, so not TNK candidate. Does recommend admission to [facility] for TIA work up. [VK]  1217 I received call from radiology - no acute abnormality on CTH. [VK]  1307 AKI on CKD otherwise labs within normal range. Will call hospitalist for admission for TIA. [VK]  1339 Dr. Matthews recommends aspirin  and plavix . [VK]    Clinical Course User Index [VK] Kingsley, Sabrinia Prien K, DO                                 Medical Decision Making This patient presents to the ED with chief complaint(s) of slurred speech, aphasia with pertinent past medical history of RA, HTN, HLD, PAD, CKD, COPD which further complicates the presenting complaint. The complaint involves an extensive differential diagnosis and also carries with it a high risk of complications and morbidity.    The differential diagnosis includes CVA, TIA, ICH, mass effect, arrhythmia, anemia, dehydration, electrolyte abnormality, hypo or hyperglycemia  Additional history obtained: Additional history obtained from spouse Records reviewed outpatient cardiology records  ED Course and Reassessment: On patient's arrival she was hemodynamically stable in no acute distress.  Code stroke was called by triage for her new onset slurred speech that started at 11 AM this morning.  On my evaluation, the patient's speech had improved, NIH was 0.  She was immediately transported to CT scanner.  Teleneurology was  called.  Independent labs interpretation:  The following labs were independently interpreted: AKI on CKD, otherwise within normal range  Independent visualization of imaging: - I independently visualized  the following imaging with scope of interpretation limited to determining acute life threatening conditions related to emergency care: CTH, which revealed no acute disease  Consultation: - Consulted or discussed management/test interpretation w/ external professional: neurology  Consideration for admission or further workup: patient requires admission for TIA work up Social Determinants of health: N/A    Amount and/or Complexity of Data Reviewed Labs: ordered. Radiology: ordered.  Risk OTC drugs. Prescription drug management.          Final Clinical Impression(s) / ED Diagnoses Final diagnoses:  TIA (transient ischemic attack)    Rx / DC Orders ED Discharge Orders     None         Kingsley, Ahniya Mitchum K, DO 08/14/23 1340

## 2023-08-14 NOTE — ED Triage Notes (Signed)
 Pt reports "I could not make words for about 5 minutes" Spouse states pt started mumbling ~ 1 hour pta Pt endorses HA Code stroke called 1147

## 2023-08-14 NOTE — Progress Notes (Signed)
 [facility] Transfer Acceptance Note  Transferring MD: Dr. Richerd Later Transferring facility: [facility] Accepted to: [facility], medical telemetry, observation status  78 year old married female with PMH of HTN, HLD, RA, CKD with baseline creatinine 1.3-1.4, PAD, COPD, presented to the ED with sudden onset of dysarthria and word finding difficulties at 11 AM on 1/14.  Symptoms resolved by time of ED arrival.  Code stroke activated in ED.  CT head without acute findings.  Teleneurology consulted, recommended admission to [facility] for evaluation of TIA versus acute stroke, recommended DAPT.  On chart review, soft blood pressures with SBP in the 100s-110 range.  Lab work significant for BUN 49, creatinine 1.84 and hemoglobin 11.8.  BAL less than 10.  Code stroke CT head without acute findings and aspects of 10.  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 Later for the following: - Bedside RN swallow screen and initiate diet if she passes the swallow screen - Since patient is having soft blood pressures and concern for TIA/acute stroke, also AKI, recommended IV fluid bolus of 250-500 mL followed by maintenance IV fluids as long as she is not volume overloaded - Dr. Later advised the [facility] do not have MRI capabilities at the med center today. - As per [facility], currently no telemetry beds at [facility] will have to wait until 1 becomes available. - Accepted to [facility], telemetry bed under observation status.  Trenda Mar, MD,  FACP, [facility], [facility], [facility]   [facility] [place]     To contact the attending provider between 7A-7P or the covering provider during after hours 7P-7A, please log into the web site [email or web address] and access using universal [place] password for that web site. If you do not have the password, please call the hospital operator.

## 2023-08-15 ENCOUNTER — Observation Stay (HOSPITAL_COMMUNITY): Payer: Medicare Other

## 2023-08-15 ENCOUNTER — Encounter (HOSPITAL_COMMUNITY)
Admission: EM | Disposition: A | Discharge status: Home / Self Care | Payer: Self-pay | Source: Home / Self Care | Attending: Emergency Medicine

## 2023-08-15 ENCOUNTER — Encounter (HOSPITAL_COMMUNITY): Payer: Self-pay | Admitting: Internal Medicine

## 2023-08-15 DIAGNOSIS — I700 Atherosclerosis of aorta: Secondary | ICD-10-CM | POA: Diagnosis not present

## 2023-08-15 DIAGNOSIS — I10 Essential (primary) hypertension: Secondary | ICD-10-CM | POA: Diagnosis not present

## 2023-08-15 DIAGNOSIS — G459 Transient cerebral ischemic attack, unspecified: Secondary | ICD-10-CM

## 2023-08-15 DIAGNOSIS — R569 Unspecified convulsions: Secondary | ICD-10-CM

## 2023-08-15 DIAGNOSIS — R0609 Other forms of dyspnea: Secondary | ICD-10-CM

## 2023-08-15 DIAGNOSIS — I679 Cerebrovascular disease, unspecified: Secondary | ICD-10-CM | POA: Diagnosis not present

## 2023-08-15 DIAGNOSIS — I639 Cerebral infarction, unspecified: Secondary | ICD-10-CM

## 2023-08-15 HISTORY — PX: LOOP RECORDER INSERTION: EP1214

## 2023-08-15 LAB — HEMOGLOBIN A1C
Hgb A1c MFr Bld: 5.9 % — ABNORMAL HIGH (ref 4.8–5.6)
Mean Plasma Glucose: 122.63 mg/dL

## 2023-08-15 LAB — BASIC METABOLIC PANEL
Anion gap: 8 (ref 5–15)
BUN: 39 mg/dL — ABNORMAL HIGH (ref 8–23)
CO2: 24 mmol/L (ref 22–32)
Calcium: 8.8 mg/dL — ABNORMAL LOW (ref 8.9–10.3)
Chloride: 109 mmol/L (ref 98–111)
Creatinine, Ser: 1.59 mg/dL — ABNORMAL HIGH (ref 0.44–1.00)
GFR, Estimated: 33 mL/min — ABNORMAL LOW (ref >60)
Glucose, Bld: 100 mg/dL — ABNORMAL HIGH (ref 70–99)
Potassium: 4.7 mmol/L (ref 3.5–5.1)
Sodium: 141 mmol/L (ref 135–145)

## 2023-08-15 LAB — ECHOCARDIOGRAM COMPLETE
Area-P 1/2: 5.02 cm2
Calc EF: 39.6 %
Height: 66.5 in
S' Lateral: 2.4 cm
Single Plane A2C EF: 31.6 %
Single Plane A4C EF: 49.2 %
Weight: 2553.6 [oz_av]

## 2023-08-15 LAB — LIPID PANEL
Cholesterol: 143 mg/dL (ref 0–200)
HDL: 43 mg/dL (ref >40)
LDL Cholesterol: 43 mg/dL (ref 0–99)
Total CHOL/HDL Ratio: 3.3 {ratio}
Triglycerides: 283 mg/dL — ABNORMAL HIGH (ref <150)
VLDL: 57 mg/dL — ABNORMAL HIGH (ref 0–40)

## 2023-08-15 LAB — CBC
HCT: 33.5 % — ABNORMAL LOW (ref 36.0–46.0)
Hemoglobin: 10.9 g/dL — ABNORMAL LOW (ref 12.0–15.0)
MCH: 30.5 pg (ref 26.0–34.0)
MCHC: 32.5 g/dL (ref 30.0–36.0)
MCV: 93.8 fL (ref 80.0–100.0)
Platelets: 186 10*3/uL (ref 150–400)
RBC: 3.57 MIL/uL — ABNORMAL LOW (ref 3.87–5.11)
RDW: 13.6 % (ref 11.5–15.5)
WBC: 6.6 10*3/uL (ref 4.0–10.5)
nRBC: 0 % (ref 0.0–0.2)

## 2023-08-15 LAB — CREATININE, SERUM
Creatinine, Ser: 1.63 mg/dL — ABNORMAL HIGH (ref 0.44–1.00)
GFR, Estimated: 32 mL/min — ABNORMAL LOW (ref >60)

## 2023-08-15 SURGERY — LOOP RECORDER INSERTION

## 2023-08-15 MED ORDER — LIDOCAINE-EPINEPHRINE 1 %-1:100000 IJ SOLN
INTRAMUSCULAR | Status: AC
  Filled 2023-08-15: qty 1

## 2023-08-15 MED ORDER — PERFLUTREN LIPID MICROSPHERE
1.0000 mL | INTRAVENOUS | Status: AC | PRN
Start: 2023-08-15 — End: 2023-08-15
  Administered 2023-08-15: 1 mL via INTRAVENOUS

## 2023-08-15 MED ORDER — HEPARIN SODIUM (PORCINE) 5000 UNIT/ML IJ SOLN
5000.0000 [IU] | Freq: Three times a day (TID) | INTRAMUSCULAR | Status: DC
  Filled 2023-08-15: qty 1

## 2023-08-15 MED ORDER — ASPIRIN 81 MG PO CHEW: 81.0000 mg | CHEWABLE_TABLET | Freq: Every day | ORAL | Status: AC

## 2023-08-15 MED ORDER — SENNOSIDES-DOCUSATE SODIUM 8.6-50 MG PO TABS: 1.0000 | ORAL_TABLET | Freq: Every evening | ORAL | Status: DC | PRN

## 2023-08-15 MED ORDER — GADOBUTROL 1 MMOL/ML IV SOLN
7.0000 mL | Freq: Once | INTRAVENOUS | Status: AC | PRN
  Administered 2023-08-15: 7 mL via INTRAVENOUS

## 2023-08-15 MED ORDER — CLOPIDOGREL BISULFATE 75 MG PO TABS: 75.0000 mg | ORAL_TABLET | Freq: Every day | ORAL | 0 refills | Status: AC

## 2023-08-15 MED ORDER — INFLUENZA VAC A&B SURF ANT ADJ 0.5 ML IM SUSY: 0.5000 mL | PREFILLED_SYRINGE | INTRAMUSCULAR | Status: DC

## 2023-08-15 MED ORDER — LIDOCAINE-EPINEPHRINE 1 %-1:100000 IJ SOLN
INTRAMUSCULAR | Status: DC | PRN
  Administered 2023-08-15: 30 mL

## 2023-08-15 MED ORDER — STROKE: EARLY STAGES OF RECOVERY BOOK
Freq: Once | Status: DC
Start: 2023-08-16 — End: 2023-08-15

## 2023-08-15 SURGICAL SUPPLY — 2 items
MONITOR REVEAL LINQ II (Prosthesis & Implant Heart) IMPLANT
PACK LOOP INSERTION (CUSTOM PROCEDURE TRAY) ×1 IMPLANT

## 2023-08-15 NOTE — H&P (Signed)
 History and Physical    Donna [name] ZOX:[id number] DOB: Dec 16, 1945 DOA: 08/14/2023  PCP: Imelda Man, MD  Patient coming from: [facility]  I have personally briefly reviewed patient's old medical records in [facility]  Chief Complaint:speech difficulties x 5 minutes, assc with HA  HPI: Donna [name] is a 78 y.o. female with medical history significant of  CKDIII, COPD, Depression,GERD, HLD, HTN,Rheumatoid Arthritis,PVD Who presents to [facility] ED with episode of difficulty with speech x 4 minutes that occurred around one hour prior to presentation. Patient states no prior episodes like this in the past. She also note no recurrence.  She states she feels back to her baseline. She notes no recent illness, no f/c/n/v/d/ but did note associated dizziness as well as HA with episodes, which have also resolved.  ED Course:  Vitals: Afeb hr 71, rr18  BP 102/54 IN ED patient was noted have NIH of zero  as symptoms has resolved prior to presentation. CT head note no acute findings. Patient of note was a code stroke and was seen by tele neurologist who recommended  admission with for tia/cva evaluation. Patient at that time was also initiated on  ASA 81, as well as plavix75mg  .   Further labs: EKG: snr no hyperacute findings Wbc:6.8, hgb 11.8, plt198 Na 135, K 4.5, glu 101, cr 1.84 ( 1.40) Tx:plavix  and asa, ns bolus 500cc Review of Systems: As per HPI otherwise 10 point review of systems negative.   Past Medical History:  Diagnosis Date   Arthritis    lumbar, toes, L knee, cerv. spine & hands  - rheumatoid   Cancer (HCC)    basal cell removed fr. face    Chronic kidney disease    stage 3   COPD (chronic obstructive pulmonary disease) (HCC)    COVID    2022 and early 2023   Depression    onset after being diagnosed with rhematoid  arthritis   GERD (gastroesophageal reflux disease)    H/O echocardiogram    Hiatal hernia    History of hiatal hernia    Hyperlipidemia    Hypertension     Hypertension    currently followed for BP / heart management with Dr. Lander Pines, but prev. saw Dr. Berry Bristol   Peripheral vascular disease [facility])    Seasonal allergies     Past Surgical History:  Procedure Laterality Date   ABDOMINAL AORTOGRAM W/LOWER EXTREMITY N/A 05/30/2022   Procedure: ABDOMINAL AORTOGRAM W/LOWER EXTREMITY;  Surgeon: Knox Perl, MD;  Location: [facility] INVASIVE CV LAB;  Service: Cardiovascular;  Laterality: N/A;   APPLICATION OF WOUND VAC Left 07/05/2022   Procedure: APPLICATION OF WOUND VAC;  Surgeon: Margherita Shell, MD;  Location: [facility] OR;  Service: Vascular;  Laterality: Left;   APPLICATION OF WOUND VAC Left 07/05/2022   Procedure: APPLICATION OF WOUND VAC;  Surgeon: Margherita Shell, MD;  Location: [facility] OR;  Service: Vascular;  Laterality: Left;   BACK SURGERY  07/31/1992   cerv. spine fusion    BREAST ENHANCEMENT SURGERY     BREAST SURGERY  04/01/1979   implants- bilateral    CATARACT EXTRACTION Left 12/2022   CERVICAL SPINE SURGERY  07/31/1990   fusion   ENDARTERECTOMY FEMORAL Left 06/02/2022   Procedure: LEFT ENDARTERECTOMY FEMORAL with BOVINE PATCH;  Surgeon: Margherita Shell, MD;  Location: [facility] OR;  Service: Vascular;  Laterality: Left;   GROIN DEBRIDEMENT Left 07/05/2022   Procedure: LEFT GROIN WASHOUT WITH STIMULAN ANTIBIOTIC BEAD AND KERECIS FISH  SKIN GRAFT APPLICATION;  Surgeon: Margherita Shell, MD;  Location: [facility] OR;  Service: Vascular;  Laterality: Left;   INCISION AND DRAINAGE Left 07/05/2022   Procedure: LEFT GROIN WASHOUT;  Surgeon: Margherita Shell, MD;  Location: [facility] OR;  Service: Vascular;  Laterality: Left;   JOINT REPLACEMENT Right 07/31/2005   LOWER EXTREMITY ANGIOGRAPHY N/A 01/26/2023   Procedure: Lower Extremity Angiography;  Surgeon: Knox Perl, MD;  Location: [facility] INVASIVE CV LAB;  Service: Cardiovascular;  Laterality: N/A;   LUMBAR LAMINECTOMY/DECOMPRESSION MICRODISCECTOMY Left 10/07/2015   Procedure: Lumbar three-five Decompressive lumbar Laminectomy  & Left Lumbar four-five Microdiscectomy;  Surgeon: Yvonna Herder, MD;  Location: [facility] NEURO ORS;  Service: Neurosurgery;  Laterality: Left;   LUMBAR SPINE SURGERY  2016   [facility] ANGIOPLASTY Left 07/05/2022   Procedure: VEIN PATCH ANGIOPLASTY;  Surgeon: Margherita Shell, MD;  Location: [facility] OR;  Service: Vascular;  Laterality: Left;   TONSILLECTOMY     TOTAL HIP ARTHROPLASTY  07/31/2005   Right   TUBAL LIGATION       reports that she quit smoking about 14 years ago. Her smoking use included cigarettes. She started smoking about 38 years ago. She has a 16.8 pack-year smoking history. She quit smokeless tobacco use about 24 years ago. She reports current alcohol use. She reports that she does not use drugs.  Allergies  Allergen Reactions   Thorazine [Chlorpromazine] Anaphylaxis    Family History  Problem Relation Age of Onset   Kidney cancer Mother    Lung cancer Sister    Heart disease Maternal Aunt    Heart disease Maternal Aunt    Kidney cancer Maternal Aunt    Kidney cancer Maternal Uncle     Prior to Admission medications   Medication Sig Start Date End Date Taking? Authorizing Provider  chlorthalidone (HYGROTON) 25 MG tablet Take 25 mg by mouth daily.   Yes [provider]  pantoprazole  (PROTONIX ) 40 MG tablet Take 40 mg by mouth daily.   Yes [provider]  potassium chloride  (MICRO-K ) 10 MEQ CR capsule Take 10 mEq by mouth daily.   Yes [provider]  acetaminophen  (TYLENOL ) 500 MG tablet Take 1,000 mg by mouth every 6 (six) hours as needed for moderate pain or mild pain.    [provider]  albuterol  (PROVENTIL  HFA;VENTOLIN  HFA) 108 (90 Base) MCG/ACT inhaler Inhale 2 puffs into the lungs every 4 (four) hours as needed for wheezing or shortness of breath.    [provider]  amLODipine  (NORVASC ) 10 MG tablet Take 1 tablet (10 mg total) by mouth daily. Patient taking differently: Take 5 mg by mouth daily. 01/16/23   Knox Perl, MD   aspirin  (ASPIRIN  CHILDRENS) 81 MG chewable tablet Chew 1 tablet (81 mg total) by mouth daily. 02/06/23   Knox Perl, MD  bisoprolol  (ZEBETA ) 5 MG tablet TAKE 1 TABLET BY MOUTH DAILY Patient taking differently: Take 10 mg by mouth daily. TAKE 1 TABLET BY MOUTH DAILY 03/05/23   [name], [name], MD  buPROPion  (WELLBUTRIN  XL) 150 MG 24 hr tablet Take 150 mg by mouth in the morning. 150 mg +300 mg=450 mg    [provider]  buPROPion  (WELLBUTRIN  XL) 300 MG 24 hr tablet Take 300 mg by mouth daily. 300 mg +150 mg=450 mg    [provider]  certolizumab pegol  (CIMZIA ) 2 X 200 MG KIT Inject into the skin every 30 (thirty) days.    [provider]  DULoxetine  (CYMBALTA ) 20 MG capsule  Take 20 mg by mouth daily.    [provider]  famotidine  (PEPCID ) 20 MG tablet Take 20 mg by mouth 2 (two) times daily.    [provider]  hydroxychloroquine  (PLAQUENIL ) 200 MG tablet Take 200 mg by mouth in the morning.    [provider]  ipratropium (ATROVENT ) 0.06 % nasal spray Place 1 spray into both nostrils in the morning. 02/09/23   Diamond Formica, MD  leflunomide  (ARAVA ) 20 MG tablet Take 20 mg by mouth in the morning. 11/28/19   [provider]  Multiple Vitamins-Minerals (MULTIVITAMIN WOMEN) TABS Take 1 tablet by mouth in the morning.    [provider]  rosuvastatin  (CRESTOR ) 5 MG tablet Take 5 mg by mouth in the morning. 04/11/22   [provider]    Physical Exam: Vitals:   08/14/23 2130 08/14/23 2200 08/14/23 2253 08/14/23 2348  BP: (!) 119/59 108/60 111/60 115/63  Pulse:   70 72  Resp: 19 20 18 16   Temp:   97.6 F (36.4 C) 97.6 F (36.4 C)  TempSrc:   Oral Oral  SpO2:   96% 95%    Constitutional: NAD, calm, comfortable Vitals:   08/14/23 2130 08/14/23 2200 08/14/23 2253 08/14/23 2348  BP: (!) 119/59 108/60 111/60 115/63  Pulse:   70 72  Resp: 19 20 18 16   Temp:   97.6 F (36.4 C) 97.6 F (36.4 C)  TempSrc:   Oral  Oral  SpO2:   96% 95%   Eyes: PERRL, lids and conjunctivae normal ENMT: Mucous membranes are moist. Posterior pharynx clear of any exudate or lesions.Normal dentition.  Neck: normal, supple, no masses, no thyromegaly Respiratory: clear to auscultation bilaterally, no wheezing, no crackles. Normal respiratory effort. No accessory muscle use.  Cardiovascular: Regular rate and rhythm, no murmurs / rubs / gallops. No extremity edema. 2+ pedal pulses.  Abdomen: no tenderness, no masses palpated. No hepatosplenomegaly. Bowel sounds positive.  Musculoskeletal: no clubbing / cyanosis. No joint deformity upper and lower extremities. Good ROM, no contractures. Normal muscle tone.  Skin: no rashes, lesions, ulcers. No induration Neurologic: CN 2-12 grossly intact. Sensation intact, DTR normal. Strength 5/5 in all 4.  Psychiatric: Normal judgment and insight. Alert and oriented x 3. Normal mood.    Labs on Admission: I have personally reviewed following labs and imaging studies  CBC: Recent Labs  Lab 08/14/23 1216  WBC 6.8  NEUTROABS 3.4  HGB 11.8*  HCT 35.1*  MCV 91.9  PLT 198   Basic Metabolic Panel: Recent Labs  Lab 08/14/23 1216  NA 135  K 4.5  CL 102  CO2 23  GLUCOSE 101*  BUN 49*  CREATININE 1.84*  CALCIUM  9.3   GFR: CrCl cannot be calculated (Unknown ideal weight.). Liver Function Tests: Recent Labs  Lab 08/14/23 1216  AST 19  ALT 21  ALKPHOS 40  BILITOT 0.5  PROT 6.7  ALBUMIN  4.1   No results for input(s): "LIPASE", "AMYLASE" in the last 168 hours. No results for input(s): "AMMONIA" in the last 168 hours. Coagulation Profile: Recent Labs  Lab 08/14/23 1216  INR 0.9   Cardiac Enzymes: No results for input(s): "CKTOTAL", "CKMB", "CKMBINDEX", "TROPONINI" in the last 168 hours. BNP (last 3 results) No results for input(s): "PROBNP" in the last 8760 hours. HbA1C: No results for input(s): "HGBA1C" in the last 72 hours. CBG: Recent Labs  Lab 08/14/23 1207   GLUCAP 100*   Lipid Profile: No results for input(s): "CHOL", "HDL", "LDLCALC", "TRIG", "  CHOLHDL", "LDLDIRECT" in the last 72 hours. Thyroid  Function Tests: No results for input(s): "TSH", "T4TOTAL", "FREET4", "T3FREE", "THYROIDAB" in the last 72 hours. Anemia Panel: No results for input(s): "VITAMINB12", "FOLATE", "FERRITIN", "TIBC", "IRON", "RETICCTPCT" in the last 72 hours. Urine analysis: No results found for: "COLORURINE", "APPEARANCEUR", "LABSPEC", "PHURINE", "GLUCOSEU", "HGBUR", "BILIRUBINUR", "KETONESUR", "PROTEINUR", "UROBILINOGEN", "NITRITE", "LEUKOCYTESUR"  Radiological Exams on Admission: CT HEAD CODE STROKE WO CONTRAST Result Date: 08/14/2023 CLINICAL DATA:  Code stroke. Neuro deficit, acute, stroke suspected. Slurred speech beginning 1 hour ago. EXAM: CT HEAD WITHOUT CONTRAST TECHNIQUE: Contiguous axial images were obtained from the base of the skull through the vertex without intravenous contrast. RADIATION DOSE REDUCTION: This exam was performed according to the departmental dose-optimization program which includes automated exposure control, adjustment of the mA and/or kV according to patient size and/or use of iterative reconstruction technique. COMPARISON:  07/23/2007 FINDINGS: Brain: Mild age related volume loss. Mild small vessel ischemic change of the hemispheric white matter. No sign of acute infarction, mass lesion, hemorrhage, hydrocephalus or extra-axial collection. Vascular: There is atherosclerotic calcification of the major vessels at the base of the brain. Skull: Negative Sinuses/Orbits: Clear/normal Other: None ASPECTS (Alberta Stroke Program Early CT Score) - Ganglionic level infarction (caudate, lentiform nuclei, internal capsule, insula, M1-M3 cortex): 7 - Supraganglionic infarction (M4-M6 cortex): 3 Total score (0-10 with 10 being normal): 10 IMPRESSION: 1. No acute CT finding. Mild age related volume loss and small vessel ischemic change of the hemispheric white  matter. 2. Aspects is 10. These results were called by telephone at the time of interpretation on 08/14/2023 at 12:16 pm to provider [facility] , who verbally acknowledged these results. Electronically Signed   By: Bettylou Brunner M.D.   On: 08/14/2023 12:17    EKG: Independently reviewed. 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   COPD -no exacerbation -prn albuterol  -not on long acting inhalers as outpatient   Depression -resume Wellbutrin    GERD -ppi   RA -continue with arava /plaquenil    PVD -no active issues     DVT prophylaxis: heparin  Code Status: full/ as discussed per patient wishes in event of cardiac arrest  Family Communication: none at bedside Disposition Plan: patient  expected to be admitted greater than 2 midnights  Consults called: Neurology  Admission status: progressive care   Sabas Cradle MD [facility]   If 7PM-7AM, please contact night-coverage [email or web address] Password [facility]  08/15/2023, 12:42 AM

## 2023-08-15 NOTE — Discharge Summary (Signed)
 Physician Discharge Summary  Donna [name] WJX:[id number] DOB: Jul 20, 1946 DOA: 08/14/2023  PCP: Imelda Man, MD  Admit date: 08/14/2023 Discharge date: 08/15/2023    Admitted From: Home Disposition:  Home  Recommendations for Outpatient Follow-up:  Follow up with PCP in 1-2 weeks Please obtain BMP/CBC in one week Follow-up with neurology in 4 to 6 weeks Follow-up with cardiology for loop recorder readings Please follow up with your PCP on the following pending results: Unresulted Labs (From admission, onward)    None         Home Health: None Equipment/Devices: None  Discharge Condition: Stable CODE STATUS: Full Diet recommendation: Cardiac  Subjective: Seen and examined.  No complaints.  No recurrence of the symptoms.  Brief/Interim Summary: Donna [name] is a 78 y.o. female with medical history significant of CKDIII, COPD, Depression,GERD, HLD, HTN,Rheumatoid Arthritis,PVD Who presented to [facility] ED with episode of difficulty with speech that lasted for 20 minutes.  And occurred an hour prior to presentation.  Patient states no prior episodes like this in the past. She also note no recurrence.  She was back to baseline at the time of admission.  However admitted for further stroke workup per neurology recommendations.  Upon arrival, she was hemodynamically stable as well.  CT head note no acute findings.  She was started on aspirin  and Plavix , previously she was taking only aspirin .  She was then formally seen by neurology once she arrived to [facility].  Although MRI was negative and a stroke was ruled out, TIA was considered the cause of her symptoms and neurology was concerned about possible atrial fibrillation so they consulted cardiology and patient underwent loop recorder placement.  Neurology cleared the patient for discharge with recommendations to continue DAPT/aspirin  and Plavix  for 21 days followed by Plavix  alone.  She was seen by PT OT who recommended outpatient PT for  vestibular therapies.  Echo did not show PFO, had normal ejection fraction.  No diastolic dysfunction.  LDL 43 and hemoglobin A1c 5.9.  Resuming previous home dose of Crestor .  She will follow-up with cardiology and neurology.  Discharge Diagnoses:  Principal Problem:   TIA (transient ischemic attack) Active Problems:   Primary hypertension   PAD (peripheral artery disease) [facility])    Discharge Instructions  Discharge Instructions     Ambulatory referral to Physical Therapy   Complete by: As directed    Vestibular therapy      Allergies as of 08/15/2023       Reactions   Thorazine [chlorpromazine] Anaphylaxis        Medication List     STOP taking these medications    hydroxychloroquine  200 MG tablet Commonly known as: PLAQUENIL    potassium chloride  10 MEQ CR capsule Commonly known as: MICRO-K    potassium chloride  SA 20 MEQ tablet Commonly known as: KLOR-CON  M   predniSONE  5 MG tablet Commonly known as: DELTASONE        TAKE these medications    acetaminophen  325 MG tablet Commonly known as: TYLENOL  Take 650 mg by mouth daily as needed for moderate pain (pain score 4-6) or mild pain (pain score 1-3).   albuterol  108 (90 Base) MCG/ACT inhaler Commonly known as: VENTOLIN  HFA Inhale 2 puffs into the lungs every 4 (four) hours as needed for wheezing or shortness of breath.   amLODipine  10 MG tablet Commonly known as: NORVASC  Take 1 tablet (10 mg total) by mouth daily. What changed: Another medication with the same name was removed. Continue taking this  medication, and follow the directions you see here.   aspirin  81 MG chewable tablet Commonly known as: Aspirin  Childrens Chew 1 tablet (81 mg total) by mouth daily for 21 days.   bisoprolol  5 MG tablet Commonly known as: ZEBETA  TAKE 1 TABLET BY MOUTH DAILY What changed:  how much to take additional instructions   Breztri  Aerosphere 160-9-4.8 MCG/ACT Aero Generic drug:  Budeson-Glycopyrrol-Formoterol Inhale 2 puffs into the lungs in the morning and at bedtime.   buPROPion  300 MG 24 hr tablet Commonly known as: WELLBUTRIN  XL Take 300 mg by mouth daily.   chlorthalidone 25 MG tablet Commonly known as: HYGROTON Take 25 mg by mouth daily.   Cimzia  2 X 200 MG Kit Generic drug: certolizumab pegol  Inject into the skin every 30 (thirty) days.   clopidogrel  75 MG tablet Commonly known as: Plavix  Take 1 tablet (75 mg total) by mouth daily.   DULoxetine  20 MG capsule Commonly known as: CYMBALTA  Take 20 mg by mouth daily.   famotidine  20 MG tablet Commonly known as: PEPCID  Take 20 mg by mouth daily.   ipratropium 0.06 % nasal spray Commonly known as: ATROVENT  Place 1 spray into both nostrils in the morning. What changed:  how much to take additional instructions   leflunomide  20 MG tablet Commonly known as: ARAVA  Take 20 mg by mouth in the morning.   Multivitamin Women Tabs Take 2 tablets by mouth in the morning.   olmesartan-hydrochlorothiazide  40-25 MG tablet Commonly known as: BENICAR HCT Take 1 tablet by mouth daily.   pantoprazole  40 MG tablet Commonly known as: PROTONIX  Take 40 mg by mouth daily.   rosuvastatin  5 MG tablet Commonly known as: CRESTOR  Take 5 mg by mouth in the morning.        Follow-up Information     [facility]. Schedule an appointment as soon as possible for a visit.   Specialty: Rehabilitation Contact information: [address] [place], [address] [place]  [address] [phone]        Imelda Man, MD Follow up in 1 week(s).   Specialty: Internal Medicine Contact information: [address] [place] [place] [address] [phone]                Allergies  Allergen Reactions   Thorazine [Chlorpromazine] Anaphylaxis    Consultations: Cardiology and neurology   Procedures/Studies: ECHOCARDIOGRAM COMPLETE Result Date:  08/15/2023    ECHOCARDIOGRAM REPORT   Patient Name:   Donna [name] Date of Exam: 08/15/2023 Medical Rec #:  [id number]     Height:       66.5 in Accession #:    [id number]    Weight:       159.6 lb Date of Birth:  12-18-1945     BSA:          1.827 m Patient Age:    77 years      BP:           108/57 mmHg Patient Gender: F             HR:           80 bpm. Exam Location:  Inpatient Procedure: 2D Echo, Cardiac Doppler and Color Doppler Indications:    TIA  History:        Patient has prior history of Echocardiogram examinations, most                 recent 05/25/2022. PAD and COPD, Signs/Symptoms:Dyspnea; Risk  Factors:Hypertension and Diabetes.  Sonographer:    Calleen Catena Referring Phys: [id number] SARA-MAIZ A THOMAS IMPRESSIONS  1. Left ventricular ejection fraction, by estimation, is 60 to 65%. The left ventricle has normal function. The left ventricle has no regional wall motion abnormalities. Left ventricular diastolic parameters are indeterminate.  2. Right ventricular systolic function is normal. The right ventricular size is normal. There is normal pulmonary artery systolic pressure. The estimated right ventricular systolic pressure is 32.4 mmHg.  3. The mitral valve is normal in structure. No evidence of mitral valve regurgitation. No evidence of mitral stenosis.  4. The aortic valve was not well visualized. Aortic valve regurgitation is not visualized. No aortic stenosis is present.  5. The inferior vena cava is normal in size with greater than 50% respiratory variability, suggesting right atrial pressure of 3 mmHg. FINDINGS  Left Ventricle: Left ventricular ejection fraction, by estimation, is 60 to 65%. The left ventricle has normal function. The left ventricle has no regional wall motion abnormalities. The left ventricular internal cavity size was small. There is no left ventricular hypertrophy. Left ventricular diastolic parameters are indeterminate. Right Ventricle: The right  ventricular size is normal. No increase in right ventricular wall thickness. Right ventricular systolic function is normal. There is normal pulmonary artery systolic pressure. The tricuspid regurgitant velocity is 2.71 m/s, and  with an assumed right atrial pressure of 3 mmHg, the estimated right ventricular systolic pressure is 32.4 mmHg. Left Atrium: Left atrial size was normal in size. Right Atrium: Right atrial size was normal in size. Pericardium: There is no evidence of pericardial effusion. Presence of epicardial fat layer. Mitral Valve: The mitral valve is normal in structure. No evidence of mitral valve regurgitation. No evidence of mitral valve stenosis. Tricuspid Valve: The tricuspid valve is not well visualized. Tricuspid valve regurgitation is trivial. Aortic Valve: The aortic valve was not well visualized. Aortic valve regurgitation is not visualized. No aortic stenosis is present. Pulmonic Valve: The pulmonic valve was not well visualized. Pulmonic valve regurgitation is not visualized. Aorta: The aortic root is normal in size and structure. Venous: The inferior vena cava is normal in size with greater than 50% respiratory variability, suggesting right atrial pressure of 3 mmHg. IAS/Shunts: The atrial septum is grossly normal.  LEFT VENTRICLE PLAX 2D LVIDd:         1.73 cm     Diastology LVIDs:         2.40 cm     LV e' medial:    6.37 cm/s LV PW:         2.60 cm     LV E/e' medial:  13.7 LV IVS:        0.90 cm     LV e' lateral:   6.06 cm/s LVOT diam:     1.60 cm     LV E/e' lateral: 14.4 LV SV:         39 LV SV Index:   21 LVOT Area:     2.01 cm  LV Volumes (MOD) LV vol d, MOD A2C: 45.3 ml LV vol d, MOD A4C: 32.9 ml LV vol s, MOD A2C: 31.0 ml LV vol s, MOD A4C: 16.7 ml LV SV MOD A2C:     14.3 ml LV SV MOD A4C:     32.9 ml LV SV MOD BP:      15.8 ml RIGHT VENTRICLE             IVC RV Basal diam:  3.40 cm  IVC diam: 0.80 cm RV S prime:     15.70 cm/s TAPSE (M-mode): 2.2 cm LEFT ATRIUM              Index       RIGHT ATRIUM          Index LA diam:        2.00 cm 1.09 cm/m  RA Area:     8.12 cm LA Vol (A2C):   10.5 ml 5.75 ml/m  RA Volume:   16.00 ml 8.76 ml/m LA Vol (A4C):   16.1 ml 8.81 ml/m LA Biplane Vol: 13.3 ml 7.28 ml/m  AORTIC VALVE LVOT Vmax:   87.30 cm/s LVOT Vmean:  59.900 cm/s LVOT VTI:    0.195 m  AORTA Ao Root diam: 2.70 cm MITRAL VALVE               TRICUSPID VALVE MV Area (PHT): 5.02 cm    TR Peak grad:   29.4 mmHg MV Decel Time: 151 msec    TR Vmax:        271.00 cm/s MV E velocity: 87.00 cm/s MV A velocity: 84.70 cm/s  SHUNTS MV E/A ratio:  1.03        Systemic VTI:  0.20 m                            Systemic Diam: 1.60 cm Carson Clara MD Electronically signed by Carson Clara MD Signature Date/Time: 08/15/2023/2:36:22 PM    Final    EEG adult Result Date: 08/15/2023 Arleene Lack, MD     08/15/2023 10:44 AM Patient Name: Donna [name] MRN: [id number] Epilepsy Attending: Arleene Lack Referring Physician/Provider: Colon Dear, NP Date: 08/15/2023 Duration: 22.33 mins Patient history: 78yo F with speech difficulties x 5 minutes, assc with headache. EEG to evaluate for seizure Level of alertness: Awake AEDs during EEG study: None Technical aspects: This EEG study was done with scalp electrodes positioned according to the 10-20 International system of electrode placement. Electrical activity was reviewed with band pass filter of 1-70Hz , sensitivity of 7 uV/mm, display speed of 9mm/sec with a 60Hz  notched filter applied as appropriate. EEG data were recorded continuously and digitally stored.  Video monitoring was available and reviewed as appropriate. Description: The posterior dominant rhythm consists of 8-9 Hz activity of moderate voltage (25-35 uV) seen predominantly in posterior head regions, symmetric and reactive to eye opening and eye closing. Physiologic photic driving was not seen during photic stimulation.  Hyperventilation was not performed.    IMPRESSION: This study is within normal limits. No seizures or epileptiform discharges were seen throughout the recording. A normal interictal EEG does not exclude the diagnosis of epilepsy. Arleene Lack   DG Chest 2 View Result Date: 08/15/2023 CLINICAL DATA:  [id number] Acute CVA (cerebrovascular accident) [facility]) [id number] EXAM: CHEST - 2 VIEW COMPARISON:  CT 02/07/2023 FINDINGS: The heart size and mediastinal contours are within normal limits. Aortic atherosclerosis. No focal airspace consolidation, pleural effusion, or pneumothorax. The visualized skeletal structures are unremarkable. IMPRESSION: No active cardiopulmonary disease. Electronically Signed   By: Leverne Reading D.O.   On: 08/15/2023 10:22   US  ELASTOGRAPHY LIVER Result Date: 08/15/2023 CLINICAL DATA:  Cirrhosis EXAM: US  ELASTOGRAPHY HEPATIC TECHNIQUE: Sonography of the liver was performed. In addition, ultrasound elastography evaluation of the liver was performed. A region of interest was placed within the right lobe of the liver. Following application of a  compressive sonographic pulse, tissue compressibility was assessed. Multiple assessments were performed at the selected site. Median tissue compressibility was determined. Previously, hepatic stiffness was assessed by shear wave velocity. Based on recently published Society of Radiologists in Ultrasound consensus article, reporting is now recommended to be performed in the SI units of pressure (kiloPascals) representing hepatic stiffness/elasticity. The obtained result is compared to the published reference standards. (cACLD = compensated Advanced Chronic Liver Disease) COMPARISON:  None Available. FINDINGS: Liver: No focal lesion identified. Coarsened parenchymal echogenicity. Portal vein is patent on color Doppler imaging with normal direction of blood flow towards the liver. ULTRASOUND HEPATIC ELASTOGRAPHY Device: Siemens Helix VTQ Patient position: Oblique Transducer: 6C1 Number of  measurements: 10 Hepatic segment:  8 Median kPa: 30.6 IQR: 6.2 IQR/Median kPa ratio: 0.2 Data quality:  Good Diagnostic category: > or =17 kPa: highly suggestive of cACLD with an increased probability of clinically significant portal hypertension The use of hepatic elastography is applicable to patients with viral hepatitis and non-alcoholic fatty liver disease. At this time, there is insufficient data for the referenced cut-off values and use in other causes of liver disease, including alcoholic liver disease. Patients, however, may be assessed by elastography and serve as their own reference standard/baseline. In patients with non-alcoholic liver disease, the values suggesting compensated advanced chronic liver disease (cACLD) may be lower, and patients may need additional testing with elasticity results of 7-9 kPa. Please note that abnormal hepatic elasticity and shear wave velocities may also be identified in clinical settings other than with hepatic fibrosis, such as: acute hepatitis, elevated right heart and central venous pressures including use of beta blockers, veno-occlusive disease (Budd-Chiari), infiltrative processes such as mastocytosis/amyloidosis/infiltrative tumor/lymphoma, extrahepatic cholestasis, with hyperemia in the post-prandial state, and with liver transplantation. Correlation with patient history, laboratory data, and clinical condition recommended. Diagnostic Categories: < or =5 kPa: high probability of being normal < or =9 kPa: in the absence of other known clinical signs, rules out cACLD >9 kPa and ?13 kPa: suggestive of cACLD, but needs further testing >13 kPa: highly suggestive of cACLD > or =17 kPa: highly suggestive of cACLD with an increased probability of clinically significant portal hypertension IMPRESSION: ULTRASOUND LIVER: Increased hepatic echotexture, most commonly seen with steatosis. No mass. Correlation with elevated liver enzymes is recommended. ULTRASOUND HEPATIC  ELASTOGRAPHY: Median kPa: 30.6 Diagnostic category: > or =17 kPa: highly suggestive of cACLD with an increased probability of clinically significant portal hypertension Electronically Signed   By: Beula Brunswick M.D.   On: 08/15/2023 07:46   MR BRAIN WO CONTRAST Result Date: 08/15/2023 CLINICAL DATA:  Transient ischemic attack EXAM: MRI HEAD WITHOUT CONTRAST MRA HEAD WITHOUT CONTRAST MRA NECK WITHOUT AND WITH CONTRAST TECHNIQUE: Multiplanar, multiecho pulse sequences of the brain and surrounding structures were obtained without intravenous contrast. Angiographic images of the Circle of Willis were obtained using MRA technique without intravenous contrast. Angiographic images of the neck were obtained using MRA technique without and with intravenous contrast. Carotid stenosis measurements (when applicable) are obtained utilizing NASCET criteria, using the distal internal carotid diameter as the denominator. CONTRAST:  7mL GADAVIST  GADOBUTROL  1 MMOL/ML IV SOLN COMPARISON:  None Available. FINDINGS: MRI HEAD FINDINGS Brain: No acute infarct, mass effect or extra-axial collection. No acute or chronic hemorrhage. There is multifocal hyperintense T2-weighted signal within the white matter. Parenchymal volume and CSF spaces are normal. The midline structures are normal. Vascular: Normal flow voids. Skull and upper cervical spine: Normal calvarium and skull base. Visualized upper cervical spine and soft  tissues are normal. Sinuses/Orbits:No paranasal sinus fluid levels or advanced mucosal thickening. No mastoid or middle ear effusion. Normal orbits. MRA HEAD FINDINGS POSTERIOR CIRCULATION: Vertebral arteries are normal. No proximal occlusion of the anterior or inferior cerebellar arteries. Basilar artery is normal. Superior cerebellar arteries are normal. Posterior cerebral arteries are normal. ANTERIOR CIRCULATION: Intracranial internal carotid arteries are normal. Anterior cerebral arteries are normal. Middle cerebral  arteries are normal. Anatomic Variants: Fetal origin of the right PCA. MRA NECK FINDINGS Normal 3 vessel branching pattern of the aortic arch. The visualized subclavian arteries are normal. The vertebral system is codominant and normal. The carotid systems are normal without stenosis of either ICA. IMPRESSION: 1. Chronic ischemic microangiopathy without acute intracranial abnormality. 2. Normal intracranial MRA. 3. Normal MRA of the neck. Electronically Signed   By: Juanetta Nordmann M.D.   On: 08/15/2023 04:00   MR ANGIO HEAD WO CONTRAST Result Date: 08/15/2023 CLINICAL DATA:  Transient ischemic attack EXAM: MRI HEAD WITHOUT CONTRAST MRA HEAD WITHOUT CONTRAST MRA NECK WITHOUT AND WITH CONTRAST TECHNIQUE: Multiplanar, multiecho pulse sequences of the brain and surrounding structures were obtained without intravenous contrast. Angiographic images of the Circle of Willis were obtained using MRA technique without intravenous contrast. Angiographic images of the neck were obtained using MRA technique without and with intravenous contrast. Carotid stenosis measurements (when applicable) are obtained utilizing NASCET criteria, using the distal internal carotid diameter as the denominator. CONTRAST:  7mL GADAVIST  GADOBUTROL  1 MMOL/ML IV SOLN COMPARISON:  None Available. FINDINGS: MRI HEAD FINDINGS Brain: No acute infarct, mass effect or extra-axial collection. No acute or chronic hemorrhage. There is multifocal hyperintense T2-weighted signal within the white matter. Parenchymal volume and CSF spaces are normal. The midline structures are normal. Vascular: Normal flow voids. Skull and upper cervical spine: Normal calvarium and skull base. Visualized upper cervical spine and soft tissues are normal. Sinuses/Orbits:No paranasal sinus fluid levels or advanced mucosal thickening. No mastoid or middle ear effusion. Normal orbits. MRA HEAD FINDINGS POSTERIOR CIRCULATION: Vertebral arteries are normal. No proximal occlusion of the  anterior or inferior cerebellar arteries. Basilar artery is normal. Superior cerebellar arteries are normal. Posterior cerebral arteries are normal. ANTERIOR CIRCULATION: Intracranial internal carotid arteries are normal. Anterior cerebral arteries are normal. Middle cerebral arteries are normal. Anatomic Variants: Fetal origin of the right PCA. MRA NECK FINDINGS Normal 3 vessel branching pattern of the aortic arch. The visualized subclavian arteries are normal. The vertebral system is codominant and normal. The carotid systems are normal without stenosis of either ICA. IMPRESSION: 1. Chronic ischemic microangiopathy without acute intracranial abnormality. 2. Normal intracranial MRA. 3. Normal MRA of the neck. Electronically Signed   By: Juanetta Nordmann M.D.   On: 08/15/2023 04:00   MR MRA NECK W CONTRAST Result Date: 08/15/2023 CLINICAL DATA:  Transient ischemic attack EXAM: MRI HEAD WITHOUT CONTRAST MRA HEAD WITHOUT CONTRAST MRA NECK WITHOUT AND WITH CONTRAST TECHNIQUE: Multiplanar, multiecho pulse sequences of the brain and surrounding structures were obtained without intravenous contrast. Angiographic images of the Circle of Willis were obtained using MRA technique without intravenous contrast. Angiographic images of the neck were obtained using MRA technique without and with intravenous contrast. Carotid stenosis measurements (when applicable) are obtained utilizing NASCET criteria, using the distal internal carotid diameter as the denominator. CONTRAST:  7mL GADAVIST  GADOBUTROL  1 MMOL/ML IV SOLN COMPARISON:  None Available. FINDINGS: MRI HEAD FINDINGS Brain: No acute infarct, mass effect or extra-axial collection. No acute or chronic hemorrhage. There is multifocal hyperintense T2-weighted signal within the  white matter. Parenchymal volume and CSF spaces are normal. The midline structures are normal. Vascular: Normal flow voids. Skull and upper cervical spine: Normal calvarium and skull base. Visualized upper  cervical spine and soft tissues are normal. Sinuses/Orbits:No paranasal sinus fluid levels or advanced mucosal thickening. No mastoid or middle ear effusion. Normal orbits. MRA HEAD FINDINGS POSTERIOR CIRCULATION: Vertebral arteries are normal. No proximal occlusion of the anterior or inferior cerebellar arteries. Basilar artery is normal. Superior cerebellar arteries are normal. Posterior cerebral arteries are normal. ANTERIOR CIRCULATION: Intracranial internal carotid arteries are normal. Anterior cerebral arteries are normal. Middle cerebral arteries are normal. Anatomic Variants: Fetal origin of the right PCA. MRA NECK FINDINGS Normal 3 vessel branching pattern of the aortic arch. The visualized subclavian arteries are normal. The vertebral system is codominant and normal. The carotid systems are normal without stenosis of either ICA. IMPRESSION: 1. Chronic ischemic microangiopathy without acute intracranial abnormality. 2. Normal intracranial MRA. 3. Normal MRA of the neck. Electronically Signed   By: Juanetta Nordmann M.D.   On: 08/15/2023 04:00   CT HEAD CODE STROKE WO CONTRAST Result Date: 08/14/2023 CLINICAL DATA:  Code stroke. Neuro deficit, acute, stroke suspected. Slurred speech beginning 1 hour ago. EXAM: CT HEAD WITHOUT CONTRAST TECHNIQUE: Contiguous axial images were obtained from the base of the skull through the vertex without intravenous contrast. RADIATION DOSE REDUCTION: This exam was performed according to the departmental dose-optimization program which includes automated exposure control, adjustment of the mA and/or kV according to patient size and/or use of iterative reconstruction technique. COMPARISON:  07/23/2007 FINDINGS: Brain: Mild age related volume loss. Mild small vessel ischemic change of the hemispheric white matter. No sign of acute infarction, mass lesion, hemorrhage, hydrocephalus or extra-axial collection. Vascular: There is atherosclerotic calcification of the major vessels at  the base of the brain. Skull: Negative Sinuses/Orbits: Clear/normal Other: None ASPECTS ([place]berta Stroke Program Early CT Score) - Ganglionic level infarction (caudate, lentiform nuclei, internal capsule, insula, M1-M3 cortex): 7 - Supraganglionic infarction (M4-M6 cortex): 3 Total score (0-10 with 10 being normal): 10 IMPRESSION: 1. No acute CT finding. Mild age related volume loss and small vessel ischemic change of the hemispheric white matter. 2. Aspects is 10. These results were called by telephone at the time of interpretation on 08/14/2023 at 12:16 pm to provider [facility] , who verbally acknowledged these results. Electronically Signed   By: Bettylou Brunner M.D.   On: 08/14/2023 12:17     Discharge Exam: Vitals:   08/15/23 0801 08/15/23 1102  BP: 113/70 112/60  Pulse: 71 71  Resp: 17 16  Temp: 97.8 F (36.6 C) 98 F (36.7 C)  SpO2: 98% 98%   Vitals:   08/15/23 0100 08/15/23 0404 08/15/23 0801 08/15/23 1102  BP:  (!) 108/57 113/70 112/60  Pulse:  72 71 71  Resp:  16 17 16   Temp:  (!) 97.5 F (36.4 C) 97.8 F (36.6 C) 98 F (36.7 C)  TempSrc:  Oral Oral Oral  SpO2:  97% 98% 98%  Weight:  72.4 kg    Height: 5' 6.5" (1.689 m)       General: Pt is alert, awake, not in acute distress Cardiovascular: RRR, S1/S2 +, no rubs, no gallops Respiratory: CTA bilaterally, no wheezing, no rhonchi Abdominal: Soft, NT, ND, bowel sounds + Extremities: no edema, no cyanosis    The results of significant diagnostics from this hospitalization (including imaging, microbiology, ancillary and laboratory) are listed below for reference.     Microbiology:  No results found for this or any previous visit (from the past 240 hours).   Labs: BNP (last 3 results) No results for input(s): "BNP" in the last 8760 hours. Basic Metabolic Panel: Recent Labs  Lab 08/14/23 1216 08/15/23 0546 08/15/23 0551  NA 135 141  --   K 4.5 4.7  --   CL 102 109  --   CO2 23 24  --   GLUCOSE 101* 100*   --   BUN 49* 39*  --   CREATININE 1.84* 1.59* 1.63*  CALCIUM  9.3 8.8*  --    Liver Function Tests: Recent Labs  Lab 08/14/23 1216  AST 19  ALT 21  ALKPHOS 40  BILITOT 0.5  PROT 6.7  ALBUMIN  4.1   No results for input(s): "LIPASE", "AMYLASE" in the last 168 hours. No results for input(s): "AMMONIA" in the last 168 hours. CBC: Recent Labs  Lab 08/14/23 1216 08/15/23 0551  WBC 6.8 6.6  NEUTROABS 3.4  --   HGB 11.8* 10.9*  HCT 35.1* 33.5*  MCV 91.9 93.8  PLT 198 186   Cardiac Enzymes: No results for input(s): "CKTOTAL", "CKMB", "CKMBINDEX", "TROPONINI" in the last 168 hours. BNP: Invalid input(s): "POCBNP" CBG: Recent Labs  Lab 08/14/23 1207  GLUCAP 100*   D-Dimer No results for input(s): "DDIMER" in the last 72 hours. Hgb A1c Recent Labs    08/15/23 0551  HGBA1C 5.9*   Lipid Profile Recent Labs    08/15/23 0551  CHOL 143  HDL 43  LDLCALC 43  TRIG 283*  CHOLHDL 3.3   Thyroid  function studies No results for input(s): "TSH", "T4TOTAL", "T3FREE", "THYROIDAB" in the last 72 hours.  Invalid input(s): "FREET3" Anemia work up No results for input(s): "VITAMINB12", "FOLATE", "FERRITIN", "TIBC", "IRON", "RETICCTPCT" in the last 72 hours. Urinalysis No results found for: "COLORURINE", "APPEARANCEUR", "LABSPEC", "PHURINE", "GLUCOSEU", "HGBUR", "BILIRUBINUR", "KETONESUR", "PROTEINUR", "UROBILINOGEN", "NITRITE", "LEUKOCYTESUR" Sepsis Labs Recent Labs  Lab 08/14/23 1216 08/15/23 0551  WBC 6.8 6.6   Microbiology No results found for this or any previous visit (from the past 240 hours).  FURTHER DISCHARGE INSTRUCTIONS:   Get Medicines reviewed and adjusted: Please take all your medications with you for your next visit with your Primary MD   Laboratory/radiological data: Please request your Primary MD to go over all hospital tests and procedure/radiological results at the follow up, please ask your Primary MD to get all Hospital records sent to his/her  office.   In some cases, they will be blood work, cultures and biopsy results pending at the time of your discharge. Please request that your primary care M.D. goes through all the records of your hospital data and follows up on these results.   Also Note the following: If you experience worsening of your admission symptoms, develop shortness of breath, life threatening emergency, suicidal or homicidal thoughts you must seek medical attention immediately by calling 911 or calling your MD immediately  if symptoms less severe.   You must read complete instructions/literature along with all the possible adverse reactions/side effects for all the Medicines you take and that have been prescribed to you. Take any new Medicines after you have completely understood and accpet all the possible adverse reactions/side effects.    Do not drive when taking Pain medications or sleeping medications (Benzodaizepines)   Do not take more than prescribed Pain, Sleep and Anxiety Medications. It is not advisable to combine anxiety,sleep and pain medications without talking with your primary care practitioner   Special Instructions: If  you have smoked or chewed Tobacco  in the last 2 yrs please stop smoking, stop any regular Alcohol  and or any Recreational drug use.   Wear Seat belts while driving.   Please note: You were cared for by a hospitalist during your hospital stay. Once you are discharged, your primary care physician will handle any further medical issues. Please note that NO REFILLS for any discharge medications will be authorized once you are discharged, as it is imperative that you return to your primary care physician (or establish a relationship with a primary care physician if you do not have one) for your post hospital discharge needs so that they can reassess your need for medications and monitor your lab values  Time coordinating discharge: Over 30 minutes  SIGNED:   Modena Andes, MD  [facility] 08/15/2023, 3:38 PM *Please note that this is a verbal dictation therefore any spelling or grammatical errors are due to the "[employer] One" system interpretation. If 7PM-7AM, please contact night-coverage [email or web address]

## 2023-08-15 NOTE — Plan of Care (Signed)
  Problem: Education: Goal: Knowledge of General Education information will improve Description: Including pain rating scale, medication(s)/side effects and non-pharmacologic comfort measures 08/15/2023 0440 by Auther Legacy, RN Outcome: Progressing 08/15/2023 0440 by Auther Legacy, RN Outcome: Progressing   Problem: Health Behavior/Discharge Planning: Goal: Ability to manage health-related needs will improve 08/15/2023 0440 by Auther Legacy, RN Outcome: Progressing 08/15/2023 0440 by Auther Legacy, RN Outcome: Progressing   Problem: Clinical Measurements: Goal: Ability to maintain clinical measurements within normal limits will improve Outcome: Progressing Goal: Will remain free from infection 08/15/2023 0440 by Auther Legacy, RN Outcome: Progressing 08/15/2023 0440 by Auther Legacy, RN Outcome: Progressing Goal: Diagnostic test results will improve Outcome: Progressing Goal: Respiratory complications will improve Outcome: Progressing Goal: Cardiovascular complication will be avoided Outcome: Progressing   Problem: Activity: Goal: Risk for activity intolerance will decrease 08/15/2023 0440 by Auther Legacy, RN Outcome: Progressing 08/15/2023 0440 by Auther Legacy, RN Outcome: Progressing   Problem: Nutrition: Goal: Adequate nutrition will be maintained 08/15/2023 0440 by Auther Legacy, RN Outcome: Progressing 08/15/2023 0440 by Auther Legacy, RN Outcome: Progressing   Problem: Coping: Goal: Level of anxiety will decrease 08/15/2023 0440 by Auther Legacy, RN Outcome: Progressing 08/15/2023 0440 by Auther Legacy, RN Outcome: Progressing   Problem: Elimination: Goal: Will not experience complications related to bowel motility 08/15/2023 0440 by Auther Legacy, RN Outcome: Progressing 08/15/2023 0440 by Auther Legacy, RN Outcome: Progressing Goal: Will not experience complications related to  urinary retention Outcome: Progressing   Problem: Pain Management: Goal: General experience of comfort will improve 08/15/2023 0440 by Auther Legacy, RN Outcome: Progressing 08/15/2023 0440 by Auther Legacy, RN Outcome: Progressing   Problem: Safety: Goal: Ability to remain free from injury will improve 08/15/2023 0440 by Auther Legacy, RN Outcome: Progressing 08/15/2023 0440 by Auther Legacy, RN Outcome: Progressing   Problem: Skin Integrity: Goal: Risk for impaired skin integrity will decrease Outcome: Progressing   Problem: Education: Goal: Knowledge of disease or condition will improve Outcome: Progressing Goal: Knowledge of secondary prevention will improve (MUST DOCUMENT ALL) Outcome: Progressing Goal: Knowledge of patient specific risk factors will improve Lavonia Powers N/A or DELETE if not current risk factor) Outcome: Progressing   Problem: Ischemic Stroke/TIA Tissue Perfusion: Goal: Complications of ischemic stroke/TIA will be minimized Outcome: Progressing   Problem: Coping: Goal: Will verbalize positive feelings about self Outcome: Progressing Goal: Will identify appropriate support needs Outcome: Progressing   Problem: Health Behavior/Discharge Planning: Goal: Ability to manage health-related needs will improve Outcome: Progressing Goal: Goals will be collaboratively established with patient/family Outcome: Progressing   Problem: Self-Care: Goal: Ability to participate in self-care as condition permits will improve Outcome: Progressing Goal: Verbalization of feelings and concerns over difficulty with self-care will improve Outcome: Progressing Goal: Ability to communicate needs accurately will improve Outcome: Progressing   Problem: Nutrition: Goal: Risk of aspiration will decrease Outcome: Progressing Goal: Dietary intake will improve Outcome: Progressing

## 2023-08-15 NOTE — Procedures (Signed)
 Patient Name: Donna [name]  MRN: [id number]  Epilepsy Attending: Arleene Lack  Referring Physician/Provider: Colon Dear, NP Date: 08/15/2023 Duration: 22.33 mins  Patient history: 78yo F with speech difficulties x 5 minutes, assc with headache. EEG to evaluate for seizure  Level of alertness: Awake  AEDs during EEG study: None  Technical aspects: This EEG study was done with scalp electrodes positioned according to the 10-20 International system of electrode placement. Electrical activity was reviewed with band pass filter of 1-70Hz , sensitivity of 7 uV/mm, display speed of 90mm/sec with a 60Hz  notched filter applied as appropriate. EEG data were recorded continuously and digitally stored.  Video monitoring was available and reviewed as appropriate.  Description: The posterior dominant rhythm consists of 8-9 Hz activity of moderate voltage (25-35 uV) seen predominantly in posterior head regions, symmetric and reactive to eye opening and eye closing. Physiologic photic driving was not seen during photic stimulation.  Hyperventilation was not performed.     IMPRESSION: This study is within normal limits. No seizures or epileptiform discharges were seen throughout the recording.  A normal interictal EEG does not exclude the diagnosis of epilepsy.  [name] O [name]

## 2023-08-15 NOTE — Progress Notes (Signed)
Pt back on the unit

## 2023-08-15 NOTE — Progress Notes (Signed)
 Pt back from MRI. Headache that was reported earlier, completely resolved with Tylenol .

## 2023-08-15 NOTE — Progress Notes (Signed)
 PT Cancellation Note  Patient Details Name: Donna [name] MRN: [id number] DOB: [birth year]   Cancelled Treatment:    Reason Eval/Treat Not Completed: Patient at procedure or test/unavailable  Patient eating breakfast. Will return to attempt evaluation. RN reports pt experiencing dizziness when she gets up.    [name], PT Acute Rehabilitation Services  Office [phone]  [name] 08/15/2023, 8:56 AM

## 2023-08-15 NOTE — Consult Note (Addendum)
 ELECTROPHYSIOLOGY CONSULT NOTE  Patient ID: Donna [name] MRN: [id number], DOB/AGE: [birth year]   Admit date: 08/14/2023 Date of Consult: 08/15/2023  Primary Physician: Imelda Man, MD Primary Cardiologist: None  Primary Electrophysiologist: New to Dr. Lawana Pray Reason for Consultation: Cryptogenic stroke; recommendations regarding Implantable Loop Recorder Insurance: Medicare  History of Present Illness EP has been asked to evaluate Donna [name] for placement of an implantable loop recorder to monitor for atrial fibrillation by Dr Janett Medin.  The patient was admitted on 08/14/2023 with speech difficulty.    She has undergone workup for stroke including:  Code Stroke CT head No acute abnormality. Small vessel disease. Atrophy. ASPECTS 10.    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  Inpatient stroke work-up Viha Kriegel not require a TEE per Neurology.   Echocardiogram as above. Lab work is reviewed.  Prior to admission, the patient denies chest pain, shortness of breath, dizziness, palpitations, or syncope.  She is recovering from her stroke with plans to return home  at discharge.  Allergies, Past Medical, Surgical, Social, and Family Histories have been reviewed and are referenced here-in when relevant for medical decision making.   Inpatient Medications:   [START ON 08/16/2023]  stroke: early stages of recovery book   Does not apply Once   aspirin  EC  81 mg Oral Daily   clopidogrel   75 mg Oral Daily   heparin   5,000 Units  Subcutaneous Q8H   [START ON 08/16/2023] influenza vaccine adjuvanted  0.5 mL Intramuscular Tomorrow-1000   ipratropium  1 spray Each Nare Daily   rosuvastatin   5 mg Oral Daily    Physical Exam: Vitals:   08/14/23 2348 08/15/23 0100 08/15/23 0404 08/15/23 0801  BP: 115/63  (!) 108/57 113/70  Pulse: 72  72 71  Resp: 16  16 17   Temp: 97.6 F (36.4 C)  (!) 97.5 F (36.4 C) 97.8 F (36.6 C)  TempSrc: Oral  Oral Oral  SpO2: 95%  97% 98%  Weight:   72.4 kg   Height:  5' 6.5" (1.689 m)      GEN- NAD. A&O x 3. Normal affect. HEENT: Normocephalic, atraumatic Lungs- CTAB, Normal effort.  Heart- Regular rate and rhythm rate and rhythm. No M/G/R.  Extremities- No peripheral edema. no clubbing or cyanosis Skin- warm and dry, no rash or lesion. Neuro - No residual deficit   12-lead ECG on arrival showed NSR in 70s (personally reviewed) All prior EKG's in EPIC reviewed with no documented atrial fibrillation  Telemetry NSR 60-80s (personally reviewed)  Assessment and Plan:  1. Cryptogenic stroke The patient presents with cryptogenic stroke.  The patient does not have a TEE planned for this AM.  I spoke at length with the patient about monitoring for afib with an implantable loop recorder.  Risks, benefits, and alteratives to implantable loop recorder were discussed with the patient today.   At this time, the patient is very clear in their decision to proceed with implantable loop recorder.   Wound care was reviewed with the patient (keep incision clean and dry for 3 days). Please  call with questions.   Tylene Galla, PA-C 08/15/2023 12:47 PM  I have seen and examined this patient with Michaelle Adolphus.  Agree with above, note added to reflect my findings.  On exam, RRR, no murmurs, lungs clear.  Patient presented to the hospital with cryptogenic stroke. To date, no cause has been found. Brenley Priore plan for LINQ monitor to look for atrial fibrillation. Risks and benefits discussed. Risks  include but not limited to bleeding and infection. The patient understands the risks and has agreed to the procedure.  Iveliz Garay M. Vannessa Godown MD 08/15/2023 3:31 PM

## 2023-08-15 NOTE — Progress Notes (Signed)
Pt going down for an xray

## 2023-08-15 NOTE — Progress Notes (Signed)
 Pt went off the floor for MRI. Per order tele taken off while in MRI.

## 2023-08-15 NOTE — Plan of Care (Signed)
 Discharge instructions reviewed with patient.  Problem: Education: Goal: Knowledge of General Education information will improve Description: Including pain rating scale, medication(s)/side effects and non-pharmacologic comfort measures Outcome: Adequate for Discharge   Problem: Health Behavior/Discharge Planning: Goal: Ability to manage health-related needs will improve Outcome: Adequate for Discharge   Problem: Clinical Measurements: Goal: Ability to maintain clinical measurements within normal limits will improve Outcome: Adequate for Discharge Goal: Will remain free from infection Outcome: Adequate for Discharge Goal: Diagnostic test results will improve Outcome: Adequate for Discharge Goal: Respiratory complications will improve Outcome: Adequate for Discharge Goal: Cardiovascular complication will be avoided Outcome: Adequate for Discharge   Problem: Activity: Goal: Risk for activity intolerance will decrease Outcome: Adequate for Discharge   Problem: Nutrition: Goal: Adequate nutrition will be maintained Outcome: Adequate for Discharge   Problem: Coping: Goal: Level of anxiety will decrease Outcome: Adequate for Discharge   Problem: Elimination: Goal: Will not experience complications related to bowel motility Outcome: Adequate for Discharge Goal: Will not experience complications related to urinary retention Outcome: Adequate for Discharge   Problem: Pain Management: Goal: General experience of comfort will improve Outcome: Adequate for Discharge   Problem: Safety: Goal: Ability to remain free from injury will improve Outcome: Adequate for Discharge   Problem: Skin Integrity: Goal: Risk for impaired skin integrity will decrease Outcome: Adequate for Discharge   Problem: Education: Goal: Knowledge of disease or condition will improve Outcome: Adequate for Discharge Goal: Knowledge of secondary prevention will improve (MUST DOCUMENT ALL) Outcome:  Adequate for Discharge Goal: Knowledge of patient specific risk factors will improve Lavonia Powers N/A or DELETE if not current risk factor) Outcome: Adequate for Discharge   Problem: Ischemic Stroke/TIA Tissue Perfusion: Goal: Complications of ischemic stroke/TIA will be minimized Outcome: Adequate for Discharge   Problem: Coping: Goal: Will verbalize positive feelings about self Outcome: Adequate for Discharge Goal: Will identify appropriate support needs Outcome: Adequate for Discharge   Problem: Health Behavior/Discharge Planning: Goal: Ability to manage health-related needs will improve Outcome: Adequate for Discharge Goal: Goals will be collaboratively established with patient/family Outcome: Adequate for Discharge   Problem: Self-Care: Goal: Ability to participate in self-care as condition permits will improve Outcome: Adequate for Discharge Goal: Verbalization of feelings and concerns over difficulty with self-care will improve Outcome: Adequate for Discharge Goal: Ability to communicate needs accurately will improve Outcome: Adequate for Discharge   Problem: Nutrition: Goal: Risk of aspiration will decrease Outcome: Adequate for Discharge Goal: Dietary intake will improve Outcome: Adequate for Discharge

## 2023-08-15 NOTE — TOC Transition Note (Signed)
 Transition of Care [facility]) - Discharge Note   Patient Details  Name: Donna [name] MRN: [id number] Date of Birth: 10-Aug-1945  [facility] CM/SW Contact:  Jonathan Neighbor, RN Phone Number: 08/15/2023, 4:05 PM   Clinical Narrative:     Pt is discharging home. Outpatient therapy added to AVS for patient to decide if she wants to attend for vestibular therapy. Pt will call to schedule the first appointment. Pt has transportation home.  Final next level of care: OP Rehab Barriers to Discharge: No Barriers Identified   Patient Goals and CMS Choice            Discharge Placement                       Discharge Plan and Services Additional resources added to the After Visit Summary for                                       Social Drivers of Health (SDOH) Interventions SDOH Screenings   Food Insecurity: No Food Insecurity (08/15/2023)  Housing: Low Risk  (08/15/2023)  Transportation Needs: No Transportation Needs (08/15/2023)  Utilities: Not At Risk (08/15/2023)  Social Connections: Socially Isolated (08/15/2023)  Tobacco Use: Medium Risk (08/15/2023)     Readmission Risk Interventions    07/07/2022   12:41 PM  Readmission Risk Prevention Plan  Transportation Screening Complete  PCP or Specialist Appt within 5-7 Days Complete  Home Care Screening Complete  Medication Review (RN CM) Complete

## 2023-08-15 NOTE — Progress Notes (Signed)
 Received her back from having Loop Recorder placed. Dressing intact and dry, no drainage, no bleeding.

## 2023-08-15 NOTE — Evaluation (Signed)
 Physical Therapy Evaluation and Discharge Patient Details Name: Donna [name] MRN: [id number] DOB: 1946/02/04 Today's Date: 08/15/2023  History of Present Illness  78 y.o. female Who presents 08/14/23 to ED with episode of difficulty with speech x 4 minutes associated dizziness as well as HA with episodes, which have also resolved. MRI brain no acute changes  PMH  significant of CKDIII, COPD, Depression,GERD, HLD, HTN,Rheumatoid Arthritis,PVD  Clinical Impression   Patient evaluated by Physical Therapy with no further acute PT needs identified. All education has been completed and the patient has no further questions. Pt with no imbalance with ambulation, however did have dizziness with 180 degree turn ("world doesn't move as fast as my head.") Noted +head thrust test to rt indicative of rt vestibular hypofunction. Educated on potential role of OPPT vestibular rehab if symptoms worsen or persist. Patient does not want to pursue at this time, and aware she can seek referral from PCP. See below for any follow-up Physical Therapy or equipment needs. PT is signing off. Thank you for this referral.         If plan is discharge home, recommend the following: A little help with walking and/or transfers;Assistance with cooking/housework;Help with stairs or ramp for entrance   Can travel by private vehicle        Equipment Recommendations None recommended by PT  Recommendations for Other Services       Functional Status Assessment Patient has not had a recent decline in their functional status     Precautions / Restrictions Precautions Precautions: None      Mobility  Bed Mobility Overal bed mobility: Independent                  Transfers Overall transfer level: Independent                 General transfer comment: denied dizziness    Ambulation/Gait Ambulation/Gait assistance: Contact guard assist Gait Distance (Feet): 140 Feet Assistive device: None Gait  Pattern/deviations: WFL(Within Functional Limits)   Gait velocity interpretation: >2.62 ft/sec, indicative of community ambulatory   General Gait Details: no dizziness with head turns; + dizziness (world doesn't move as fast as my head) when turned 180 degrees; no imbalance  Stairs            Wheelchair Mobility     Tilt Bed    Modified Rankin (Stroke Patients Only)       Balance Overall balance assessment: No apparent balance deficits (not formally assessed)                                           Pertinent Vitals/Pain Pain Assessment Pain Assessment: Faces Faces Pain Scale: Hurts a little bit Pain Location: feet Pain Descriptors / Indicators: Aching Pain Intervention(s): Limited activity within patient's tolerance    Home Living Family/patient expects to be discharged to:: Private residence Living Arrangements: Spouse/significant other Available Help at Discharge: Family;Available 24 hours/day Type of Home: House Home Access: Stairs to enter Entrance Stairs-Rails: Right (garage) Entrance Stairs-Number of Steps: 4   Home Layout: Two level;Able to live on main level with bedroom/bathroom Home Equipment: Cane - single point;Grab bars - tub/shower;Rolling Walker (2 wheels);Shower seat - built in      Prior Function Prior Level of Function : Needs assist             Mobility Comments: independent; occasional  use of cane due to arthritis flare up ADLs Comments: pt reports using button hook for shirt buttons, ind with ADLs     Extremity/Trunk Assessment   Upper Extremity Assessment Upper Extremity Assessment: Overall WFL for tasks assessed    Lower Extremity Assessment Lower Extremity Assessment: Overall WFL for tasks assessed    Cervical / Trunk Assessment Cervical / Trunk Assessment: Normal  Communication   Communication Communication: No apparent difficulties  Cognition Arousal: Alert Behavior During Therapy: WFL for tasks  assessed/performed Overall Cognitive Status: Within Functional Limits for tasks assessed                                          General Comments General comments (skin integrity, edema, etc.): Based on pt description of dizziness, brief vestibular evaluation completed with +head thrust to her right indicative of rt vestibular hypofunction. Reports she has been battling "sinus drainage due to allergies" Explained role of inner ear with balance and role PT can play. She has many things on her plate right now and does not want to add OPPT, however she knows if symptoms worsen or persist to ask PCP for OPPT referral.    Exercises     Assessment/Plan    PT Assessment All further PT needs can be met in the next venue of care  PT Problem List Decreased mobility       PT Treatment Interventions      PT Goals (Current goals can be found in the Care Plan section)  Acute Rehab PT Goals Patient Stated Goal: reeturn home today PT Goal Formulation: All assessment and education complete, DC therapy    Frequency       Co-evaluation               AM-PAC PT "6 Clicks" Mobility  Outcome Measure Help needed turning from your back to your side while in a flat bed without using bedrails?: None Help needed moving from lying on your back to sitting on the side of a flat bed without using bedrails?: None Help needed moving to and from a bed to a chair (including a wheelchair)?: None Help needed standing up from a chair using your arms (e.g., wheelchair or bedside chair)?: None Help needed to walk in hospital room?: A Little Help needed climbing 3-5 steps with a railing? : None 6 Click Score: 23    End of Session Equipment Utilized During Treatment: Gait belt Activity Tolerance: Patient tolerated treatment well Patient left: in bed;with call bell/phone within reach;with bed alarm set;with family/visitor present Nurse Communication: Mobility status;Other (comment) (mild  dizziness wiht turning) PT Visit Diagnosis: Dizziness and giddiness (R42)    Time: 2703-5009 PT Time Calculation (min) (ACUTE ONLY): 28 min   Charges:   PT Evaluation $PT Eval Moderate Complexity: 1 Mod PT Treatments $Self Care/Home Management: 8-22 PT General Charges $$ ACUTE PT VISIT: 1 Visit          [name], PT Acute Rehabilitation Services  Office [phone]   [name] 08/15/2023, 11:48 AM

## 2023-08-15 NOTE — Progress Notes (Signed)
 EEG complete - results pending

## 2023-08-15 NOTE — Discharge Instructions (Signed)

## 2023-08-15 NOTE — Consult Note (Addendum)
 Stroke Team Consultation  Reason for Consult: Transient episode of aphasia Referring Physician: Dr. Lilyan Remedies  CC: Transient episode of aphasia  History is obtained from: Patient and chart  HPI: Donna [name] is a 78 y.o. female with history of CKD stage III, COPD, depression, GERD, hyperlipidemia, hypertension, rheumatoid arthritis and PVD who presents with a transient episode of aphasia with word finding difficulties.  Symptoms resolved spontaneously after 15 to 20 minutes while patient was en route to the ED.  MRI demonstrates no acute abnormalities MRA brain shows no significant large vessel stenosis or occlusion.  LKW: 1/14 1100 TNK given?: no, symptoms resolved Premorbid modified Rankin scale (mRS):  0-Completely asymptomatic and back to baseline post-stroke  ROS: Full ROS was performed and is negative except as noted in the HPI.   Past Medical History:  Diagnosis Date   Arthritis    lumbar, toes, L knee, cerv. spine & hands  - rheumatoid   Cancer (HCC)    basal cell removed fr. 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 Lander Pines, but prev. saw Dr. Berry Bristol   Peripheral vascular disease (HCC)    Seasonal allergies       Family History  Problem Relation Age of Onset   Kidney cancer Mother    Lung cancer Sister    Heart disease Maternal Aunt    Heart disease Maternal Aunt    Kidney cancer Maternal Aunt    Kidney cancer Maternal Uncle      Social History:   reports that she quit smoking about 14 years ago. Her smoking use included cigarettes. She started smoking about 38 years ago. She has a 16.8  pack-year smoking history. She quit smokeless tobacco use about 24 years ago. She reports current alcohol use. She reports that she does not use drugs.  Medications  Current Facility-Administered Medications:    [START ON 08/16/2023]  stroke: early stages of recovery book, , Does not apply, Once, Sabas Cradle, MD   acetaminophen  (TYLENOL ) tablet 1,000 mg, 1,000 mg, Oral, Q6H PRN, Kingsley, Victoria K, DO, 1,000 mg at 08/15/23 0119   albuterol  (PROVENTIL ) (2.5 MG/3ML) 0.083% nebulizer solution 2.5 mg, 2.5 mg, Nebulization, Q4H PRN, Kingsley, Victoria K, DO   aspirin  EC tablet 81 mg, 81 mg, Oral, Daily, Kingsley, Victoria K, DO, 81 mg at 08/15/23 1054   clopidogrel  (PLAVIX ) tablet 75 mg, 75 mg, Oral, Daily, Kingsley, Victoria K, DO, 75 mg at 08/15/23 1053   heparin  injection 5,000 Units, 5,000 Units, Subcutaneous, Q8H, Sabas Cradle, MD   [START ON 08/16/2023] influenza vaccine adjuvanted (FLUAD) injection 0.5 mL, 0.5 mL, Intramuscular, Tomorrow-1000, Tera Fellows A, MD   ipratropium (ATROVENT ) 0.06 % nasal spray 1 spray, 1 spray, Each Nare, Daily, Kingsley, Victoria K, DO, 1 spray at 08/15/23 1054   perflutren  lipid microspheres (DEFINITY ) IV suspension, 1-10 mL, Intravenous, PRN, Tera Fellows A, MD, 1 mL at 08/15/23 1213   rosuvastatin  (CRESTOR ) tablet 5 mg, 5 mg, Oral, Daily, Kingsley, Victoria K, DO, 5 mg  at 08/15/23 1053   senna-docusate (Senokot-S) tablet 1 tablet, 1 tablet, Oral, QHS PRN, Sabas Cradle, MD  Exam: Current vital signs: BP 113/70 (BP Location: Left Arm)   Pulse 71   Temp 97.8 F (36.6 C) (Oral)   Resp 17   Ht 5' 6.5" (1.689 m) Comment: stated  Wt 72.4 kg   SpO2 98%   BMI 25.37 kg/m  Vital signs in last 24 hours: Temp:  [97.5 F (36.4 C)-98.1 F (36.7 C)] 97.8 F (36.6 C) (01/15 0801) Pulse Rate:  [65-72] 71 (01/15 0801) Resp:  [13-22] 17 (01/15 0801) BP: (102-122)/(44-70) 113/70 (01/15 0801) SpO2:  [95 %-100 %] 98 % (01/15  0801) Weight:  [72.4 kg] 72.4 kg (01/15 0404)  GENERAL: Awake, alert in NAD HEENT: - Normocephalic and atraumatic, moist mm LUNGS -regular, unlabored respirations on room air CV - S1S2 RRR, no m/r/g, equal pulses bilaterally. Ext: warm, well perfused, no edema  NEURO: Mental Status: AA&Ox3  Language: speech is clear.  Naming, repetition, fluency, and comprehension intact. Cranial Nerves: PERRL EOMI, visual fields full, no facial asymmetry, facial sensation intact, hearing intact, tongue/uvula/soft palate midline,  No evidence of tongue atrophy or fibrillations Motor: Able to move all 4 extremities with good antigravity strength Tone: is normal and bulk is normal Sensation- Intact to light touch bilaterally Coordination: FTN intact bilaterally Gait- deferred  1a Level of Conscious.: 0 1b LOC Questions: 0 1c LOC Commands: 0 2 Best Gaze: 0 3 Visual: 0 4 Facial Palsy: 0 5a Motor Arm - left: 0 5b Motor Arm - Right: 0 6a Motor Leg - Left: 0 6b Motor Leg - Right: 0 7 Limb Ataxia: 0 8 Sensory: 0 9 Best Language: 0 10 Dysarthria: 0  11 Extinct. and Inatten.: 0 TOTAL: 0  Labs I have reviewed labs in epic and the results pertinent to this consultation are:   CBC    Component Value Date/Time   WBC 6.6 08/15/2023 0551   RBC 3.57 (L) 08/15/2023 0551   HGB 10.9 (L) 08/15/2023 0551   HGB 12.9 01/16/2023 1001   HCT 33.5 (L) 08/15/2023 0551   HCT 38.5 01/16/2023 1001   PLT 186 08/15/2023 0551   PLT 218 01/16/2023 1001   MCV 93.8 08/15/2023 0551   MCV 93 01/16/2023 1001   MCH 30.5 08/15/2023 0551   MCHC 32.5 08/15/2023 0551   RDW 13.6 08/15/2023 0551   RDW 13.7 01/16/2023 1001   LYMPHSABS 1.9 08/14/2023 1216   MONOABS 1.1 (H) 08/14/2023 1216   EOSABS 0.3 08/14/2023 1216   BASOSABS 0.1 08/14/2023 1216    CMP     Component Value Date/Time   NA 141 08/15/2023 0546   NA 139 01/16/2023 1001   K 4.7 08/15/2023 0546   CL 109 08/15/2023 0546   CO2 24 08/15/2023 0546    GLUCOSE 100 (H) 08/15/2023 0546   BUN 39 (H) 08/15/2023 0546   BUN 20 01/16/2023 1001   CREATININE 1.63 (H) 08/15/2023 0551   CALCIUM  8.8 (L) 08/15/2023 0546   PROT 6.7 08/14/2023 1216   PROT 5.8 (L) 05/19/2022 0959   ALBUMIN  4.1 08/14/2023 1216   ALBUMIN  3.8 05/19/2022 0959   AST 19 08/14/2023 1216   ALT 21 08/14/2023 1216   ALKPHOS 40 08/14/2023 1216   BILITOT 0.5 08/14/2023 1216   BILITOT 0.3 05/19/2022 0959   GFRNONAA 32 (L) 08/15/2023 0551   GFRAA >60 10/01/2015 1029    Lipid Panel     Component Value Date/Time  CHOL 143 08/15/2023 0551   TRIG 283 (H) 08/15/2023 0551   HDL 43 08/15/2023 0551   CHOLHDL 3.3 08/15/2023 0551   VLDL 57 (H) 08/15/2023 0551   LDLCALC 43 08/15/2023 0551     TIA: Left-sided TIA Code Stroke CT head No acute abnormality. Small vessel disease. Atrophy. ASPECTS 10.    MRI no acute abnormality, chronic ischemic microangiopathy MRA head and neck no LVO or hemodynamically significant stenosis 2D Echo pending Patient will need loop recorder placement prior to discharge, cardiology consulted LDL 43 HgbA1c 5.9 VTE prophylaxis -subcutaneous heparin     Diet   Diet Heart Room service appropriate? Yes; Fluid consistency: Thin   aspirin  81 mg daily prior to admission, now on aspirin  81 mg daily and clopidogrel  75 mg daily for 3 weeks then Plavix  alone Therapy recommendations: No PT follow-up needed Disposition: Pending, likely home  Hypertension Home meds: Amlodipine  10 mg daily, bisoprolol  10 mg daily, chlorthalidone 25 mg daily Stable Permissive hypertension (OK if < 220/120) but gradually normalize in 48-72 hours Long-term BP goal normotensive  Hyperlipidemia Home meds: Rosuvastatin  5 mg daily, resumed in hospital LDL 43, goal < 70 High intensity statin not indicated as LDL below goal Continue statin at discharge   Other Stroke Risk Factors Advanced Age >/= 70   Other Active Problems None  Hospital day # 0   Patient seen and  examined by NP/APP with MD. MD to update note as needed.   Cortney E Bucky Cardinal , MSN, AGACNP-BC [facility] See Amion for schedule and pager information 08/15/2023 1:14 PM    STROKE MD NOTE :  I have personally obtained history,examined this patient, reviewed notes, independently viewed imaging studies, participated in medical decision making and plan of care.ROS completed by me personally and pertinent positives fully documented  I have made any additions or clarifications directly to the above note. Agree with note above.  Patient presented with transient 20-minute episode of expressive aphasia which has resolved.  No prior history of TIA or stroke.  Continue ongoing stroke workup.  She will likely need loop recorder at discharge to look for paroxysmal A-fib.  Recommend aspirin  Plavix  for 3 weeks followed by Plavix  alone and aggressive risk factor modification.  Long discussion patient and husband at the bedside and answered questions.  Discussed with Dr. Martha Slack.  Greater than 50% time during this 50-minute visit was spent in counseling and coordination of care and discussion patient and care team and husband and answering questions.  Ardella Beaver, MD Medical Director [facility] Pager: [phone] 08/15/2023 3:21 PM

## 2023-08-15 NOTE — Progress Notes (Signed)
 OT Cancellation Note  Patient Details Name: Donna [name] MRN: [id number] DOB: 1946-07-13   Cancelled Treatment:    Reason Eval/Treat Not Completed: OT screened, no needs identified, will sign off (per PT pt with no acute OT needs, will s/o. Please reconsult if there is a change in pt status)  Saheed Carrington K Koonce 08/15/2023, 12:08 PM

## 2023-08-16 ENCOUNTER — Encounter (HOSPITAL_COMMUNITY): Payer: Self-pay | Admitting: Student

## 2023-08-21 ENCOUNTER — Ambulatory Visit: Payer: Medicare Other | Attending: Family Medicine

## 2023-08-21 DIAGNOSIS — R42 Dizziness and giddiness: Secondary | ICD-10-CM | POA: Insufficient documentation

## 2023-08-21 DIAGNOSIS — R2681 Unsteadiness on feet: Secondary | ICD-10-CM | POA: Diagnosis not present

## 2023-08-21 DIAGNOSIS — G459 Transient cerebral ischemic attack, unspecified: Secondary | ICD-10-CM | POA: Diagnosis not present

## 2023-08-21 NOTE — Therapy (Unsigned)
OUTPATIENT PHYSICAL THERAPY NEURO EVALUATION   Patient Name: Donna [name] MRN: [id number] DOB:04-13-1946, 78 y.o., female Today's Date: 08/22/2023   PCP: Merri Brunette, MD REFERRING PROVIDER: Hughie Closs, MD  END OF SESSION:  PT End of Session - 08/21/23 1614     Visit Number 1    Number of Visits 12    Date for PT Re-Evaluation 10/03/23    Authorization Type Medicare    Progress Note Due on Visit 10    PT Start Time 1615    PT Stop Time 1700    PT Time Calculation (min) 45 min             Past Medical History:  Diagnosis Date   Arthritis    lumbar, toes, L knee, cerv. spine & hands  - rheumatoid   Cancer (HCC)    basal cell removed fr. 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 Gillermina Phy, but prev. saw Dr. Jacinto Halim   Peripheral vascular disease [facility])    Seasonal allergies    Past Surgical History:  Procedure Laterality Date   ABDOMINAL AORTOGRAM W/LOWER EXTREMITY N/A 05/30/2022   Procedure: ABDOMINAL AORTOGRAM W/LOWER EXTREMITY;  Surgeon: Yates Decamp, MD;  Location: [facility];  Service: Cardiovascular;  Laterality: N/A;   APPLICATION OF WOUND VAC Left 07/05/2022   Procedure: APPLICATION OF WOUND VAC;  Surgeon: Nada Libman, MD;  Location: [facility] OR;  Service: Vascular;  Laterality: Left;   APPLICATION OF WOUND VAC Left 07/05/2022   Procedure: APPLICATION OF WOUND VAC;  Surgeon: Nada Libman, MD;  Location: [facility] OR;  Service: Vascular;  Laterality: Left;   BACK SURGERY  07/31/1992   cerv. spine fusion    BREAST ENHANCEMENT SURGERY     BREAST SURGERY  04/01/1979   implants-  bilateral    CATARACT EXTRACTION Left 12/2022   CERVICAL SPINE SURGERY  07/31/1990   fusion   ENDARTERECTOMY FEMORAL Left 06/02/2022   Procedure: LEFT ENDARTERECTOMY FEMORAL with BOVINE PATCH;  Surgeon: Nada Libman, MD;  Location: [facility] OR;  Service: Vascular;  Laterality: Left;   GROIN DEBRIDEMENT Left 07/05/2022   Procedure: LEFT Marden Noble WITH STIMULAN ANTIBIOTIC BEAD AND KERECIS FISH SKIN GRAFT APPLICATION;  Surgeon: Nada Libman, MD;  Location: [facility] OR;  Service: Vascular;  Laterality: Left;   INCISION AND DRAINAGE Left 07/05/2022   Procedure: LEFT GROIN WASHOUT;  Surgeon: Nada Libman, MD;  Location: [facility] OR;  Service: Vascular;  Laterality: Left;   JOINT REPLACEMENT Right 07/31/2005   LOOP RECORDER INSERTION N/A 08/15/2023   Procedure: LOOP RECORDER INSERTION;  Surgeon: Graciella Freer, PA-C;  Location: [facility];  Service: Cardiovascular;  Laterality: N/A;   LOWER EXTREMITY ANGIOGRAPHY N/A 01/26/2023   Procedure: Lower Extremity Angiography;  Surgeon: Yates Decamp, MD;  Location: [facility] INVASIVE CV LAB;  Service: Cardiovascular;  Laterality: N/A;   LUMBAR LAMINECTOMY/DECOMPRESSION MICRODISCECTOMY Left 10/07/2015   Procedure: Lumbar three-five Decompressive lumbar Laminectomy & Left Lumbar four-five Microdiscectomy;  Surgeon: Shirlean Kelly,  MD;  Location: [facility];  Service: Neurosurgery;  Laterality: Left;   LUMBAR SPINE SURGERY  2016   PATCH ANGIOPLASTY Left 07/05/2022   Procedure: VEIN PATCH ANGIOPLASTY;  Surgeon: Nada Libman, MD;  Location: [facility] OR;  Service: Vascular;  Laterality: Left;   TONSILLECTOMY     TOTAL HIP ARTHROPLASTY  07/31/2005   Right   TUBAL LIGATION     Patient Active Problem List   Diagnosis Date Noted   TIA (transient ischemic attack) 08/14/2023   Solitary pulmonary nodule on lung CT 02/12/2023   Postoperative seroma involving circulatory system after other circulatory system procedure 07/05/2022   H/O left femoral artery  endarterectomy 06/02/2022 06/05/2022   PAD (peripheral artery disease) (HCC) 06/02/2022   Claudication (HCC)    Peripheral artery disease (HCC)    Rheumatoid arthritis (HCC) 12/21/2021   Primary hypertension 12/25/2019   Lumbar stenosis with neurogenic claudication 10/07/2015   COPD GOLD 2  01/20/2011   DOE (dyspnea on exertion) 12/29/2010    ONSET DATE: 08/14/23  REFERRING DIAG: G45.9 (ICD-10-CM) - TIA (transient ischemic attack)  THERAPY DIAG:  Dizziness and giddiness  Unsteadiness on feet  Rationale for Evaluation and Treatment: Rehabilitation  SUBJECTIVE:                                                                                                                                                                                             SUBJECTIVE STATEMENT: Went to ED on 08/14/23 due to sudden onset dizziness and difficulty speaking. Hospitalist conclude TIA may have occurred.  Speech difficulty resolved but dizziness has remained. Notes sit to stand can trigger dizziness and notes with head turns and bending over as well as lying down and arising from bed. Reports chronic neck issues which she has been treating by chiropractor, no report of migraine hx, denies photo/phonophobia, wears hearing aids but no worsening of tinnitus/hearing loss Pt accompanied by: self  PERTINENT HISTORY: 78 y.o. female with medical history significant of CKDIII, COPD, Depression,GERD, HLD, HTN,Rheumatoid Arthritis,PVD Who presented to [facility] ED with episode of difficulty with speech that lasted for 20 minutes  PAIN:  Pt reports multiple joints involved from RA, "no worse than usual"  PRECAUTIONS: None  RED FLAGS: None   WEIGHT BEARING RESTRICTIONS: No  FALLS: Has patient fallen in last 6 months? No  LIVING ENVIRONMENT: Lives with: lives with their spouse Lives in: House/apartment Stairs:  yes, flight to 2nd floor but has ground-floor set-up. Exterior stairs with BHR Has following equipment  at home: Single point cane and typically does not use AD  PLOF: Independent with basic ADLs  PATIENT GOALS:  eliminate symptoms to PLOF  OBJECTIVE:  Note: Objective measures were completed at Evaluation unless otherwise noted.  Vitals (Siting):  107/63 mmHg, 78 bpm,   DIAGNOSTIC FINDINGS:  IMPRESSION: 1. Chronic ischemic microangiopathy without acute intracranial abnormality. 2. Normal intracranial MRA. 3. Normal MRA of the neck.  COGNITION: Overall cognitive status: Within functional limits for tasks assessed   SENSATION: Not tested  COORDINATION: WFL  EDEMA:  none  MUSCLE TONE: NT    DTRs:  NT  POSTURE: rounded shoulders and forward head  LOWER EXTREMITY ROM:     WFL  LOWER EXTREMITY MMT:    Grossly 4/5 seated resisted tests  BED MOBILITY:  indep  TRANSFERS: indep   STAIRS: NT  GAIT: Gait pattern: antalgic Distance walked:  Assistive device utilized: None Level of assistance: Complete Independence Comments:         VESTIBULAR ASSESSMENT   GENERAL OBSERVATION: wears progressive lenses    SYMPTOM BEHAVIOR:   Subjective history: sudden onset speech difficulty and dizziness, speech aspect resolved, dizziness remains   Non-Vestibular symptoms: none noted, feels nauseated with onset of symptoms   Type of dizziness: Blurred Vision, Diplopia, Spinning/Vertigo, and "Funny feeling in the head"   Frequency: daily   Duration: seconds-minutes   Aggravating factors: Spontaneous and Induced by position change: lying supine, rolling to the right, rolling to the left, supine to sit, and sit to stand   Relieving factors: slow movements   Progression of symptoms: unchanged   OCULOMOTOR EXAM:   Ocular Alignment: normal   Ocular ROM: No Limitations   Spontaneous Nystagmus: absent   Gaze-Induced Nystagmus: absent   Smooth Pursuits: intact   Saccades: intact   Convergence/Divergence: 8 cm      VESTIBULAR - OCULAR REFLEX:    Slow VOR: Comment:  feeling of dizziness horizontal > vertical   VOR Cancellation: Normal, dizziness after completion   Head-Impulse Test: negative   Dynamic Visual Acuity: Not able to be assessed      POSITIONAL TESTING: Right Dix-Hallpike: upbeating, right nystagmus Left Dix-Hallpike: no nystagmus Right Roll Test: no nystagmus Left Roll Test: no nystagmus    MOTION SENSITIVITY: --symptomatic with arising from test positions   OTHOSTATICS: not done  FUNCTIONAL GAIT: TBD   VESTIBULAR TREATMENT:  Canalith Repositioning:   Epley Right: Number of Reps: 1 Gaze Adaptation:   TBD Habituation:   TBD Other:                                                                                                                     TREATMENT DATE: 08/21/23    PATIENT EDUCATION: Education details: assessment details, rationale of intervention Person educated: Patient Education method: Explanation Education comprehension: verbalized understanding  HOME EXERCISE PROGRAM: TBD  GOALS: Goals reviewed with patient? Yes  SHORT TERM GOALS: Target date: same as LTG    LONG TERM GOALS: Target date: 10/03/2023    Patient will be independent in HEP to improve functional outcomes Baseline:  Goal status: INITIAL  2.  Patient to  be free of positional dizziness to reduce discomfort and risk for falls from dizziness Baseline: +R Dix-Hallpike Goal status: INITIAL  3.  Demo improved postural stability per mild sway x 30 sec Condition 4 M-CTSIB to improve safety with ADL Baseline: TBD Goal status: INITIAL  4.  Ambulate with low risk for falls per score 20/24 Dynamic Gait Index for improved safety Baseline: TBD Goal status: INITIAL    ASSESSMENT:  CLINICAL IMPRESSION: Patient is a 78 y.o. lady who was seen today for physical therapy evaluation and treatment for deficits following TIA.  Pt denies any focal weakness reporting general weakness/malaise from her hx of RA.  Pt notes onset of dizziness at time  of event and this symptom has remained present daily since that time.  Notes movement and position changes are triggering.  Pt symptomatic with VOR testing and no abnormalities noted with oculomotor exam. Positional testing reveals right upbeating nystagmus when in Right Dix-Hallpike position. Treatment initiated with Right Epley maneuver and pt education on condition of BPPV and rationale of tx.  Continued sessions indicated to address symptoms and resultant balance deficits to improve safety with mobility   OBJECTIVE IMPAIRMENTS: Abnormal gait, decreased activity tolerance, decreased balance, difficulty walking, dizziness, and postural dysfunction.   ACTIVITY LIMITATIONS: carrying, bending, sleeping, bed mobility, reach over head, and locomotion level  PARTICIPATION LIMITATIONS: meal prep, cleaning, driving, and community activity  PERSONAL FACTORS: Age, Time since onset of injury/illness/exacerbation, and 3+ comorbidities: PMH  are also affecting patient's functional outcome.   REHAB POTENTIAL: Excellent  CLINICAL DECISION MAKING: Evolving/moderate complexity  EVALUATION COMPLEXITY: Moderate  PLAN:  PT FREQUENCY: 1-2x/week  PT DURATION: 6 weeks  PLANNED INTERVENTIONS: 97110-Therapeutic exercises, 97530- Therapeutic activity, 97112- Neuromuscular re-education, 97535- Self Care, [id number]- Manual therapy, [phone]- Gait training, [phone]- Canalith repositioning, [phone]- Aquatic Therapy, Patient/Family education, Balance training, Stair training, Taping, Dry Needling, Joint mobilization, Spinal mobilization, Vestibular training, and DME instructions  PLAN FOR NEXT SESSION: re-assess positional dizziness, DGI, M-CTSIB, relevant HEP activities   7:47 AM, 08/22/23 M. Shary Decamp, PT, DPT Physical Therapist- [place] Office Number: [phone]

## 2023-08-22 ENCOUNTER — Other Ambulatory Visit: Payer: Self-pay

## 2023-08-23 DIAGNOSIS — G459 Transient cerebral ischemic attack, unspecified: Secondary | ICD-10-CM | POA: Diagnosis not present

## 2023-08-23 DIAGNOSIS — Z09 Encounter for follow-up examination after completed treatment for conditions other than malignant neoplasm: Secondary | ICD-10-CM | POA: Diagnosis not present

## 2023-08-23 DIAGNOSIS — I10 Essential (primary) hypertension: Secondary | ICD-10-CM | POA: Diagnosis not present

## 2023-08-23 DIAGNOSIS — N1832 Chronic kidney disease, stage 3b: Secondary | ICD-10-CM | POA: Diagnosis not present

## 2023-08-23 NOTE — Therapy (Signed)
OUTPATIENT PHYSICAL THERAPY NEURO TREATMENT   Patient Name: Donna [name] MRN: [id number] DOB:September 16, 1945, 78 y.o., female Today's Date: 08/28/2023   PCP: Merri Brunette, MD REFERRING PROVIDER: Hughie Closs, MD  END OF SESSION:  PT End of Session - 08/28/23 1612     Visit Number 2    Number of Visits 12    Date for PT Re-Evaluation 10/03/23    Authorization Type Medicare    Progress Note Due on Visit 10    PT Start Time 1533    PT Stop Time 1612    PT Time Calculation (min) 39 min    Equipment Utilized During Treatment Gait belt    Activity Tolerance Patient tolerated treatment well    Behavior During Therapy WFL for tasks assessed/performed              Past Medical History:  Diagnosis Date   Arthritis    lumbar, toes, L knee, cerv. spine & hands  - rheumatoid   Cancer (HCC)    basal cell removed fr. 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 Gillermina Phy, but prev. saw Dr. Jacinto Halim   Peripheral vascular disease [facility])    Seasonal allergies    Past Surgical History:  Procedure Laterality Date   ABDOMINAL AORTOGRAM W/LOWER EXTREMITY N/A 05/30/2022   Procedure: ABDOMINAL AORTOGRAM W/LOWER EXTREMITY;  Surgeon: Yates Decamp, MD;  Location: [facility];  Service: Cardiovascular;  Laterality: N/A;   APPLICATION OF WOUND VAC Left 07/05/2022   Procedure: APPLICATION OF WOUND VAC;  Surgeon: Nada Libman, MD;  Location: [facility] OR;  Service: Vascular;  Laterality: Left;   APPLICATION OF WOUND VAC Left 07/05/2022   Procedure: APPLICATION OF WOUND VAC;  Surgeon: Nada Libman, MD;  Location: [facility] OR;   Service: Vascular;  Laterality: Left;   BACK SURGERY  07/31/1992   cerv. spine fusion    BREAST ENHANCEMENT SURGERY     BREAST SURGERY  04/01/1979   implants- bilateral    CATARACT EXTRACTION Left 12/2022   CERVICAL SPINE SURGERY  07/31/1990   fusion   ENDARTERECTOMY FEMORAL Left 06/02/2022   Procedure: LEFT ENDARTERECTOMY FEMORAL with BOVINE PATCH;  Surgeon: Nada Libman, MD;  Location: [facility] OR;  Service: Vascular;  Laterality: Left;   GROIN DEBRIDEMENT Left 07/05/2022   Procedure: LEFT Marden Noble WITH STIMULAN ANTIBIOTIC BEAD AND KERECIS FISH SKIN GRAFT APPLICATION;  Surgeon: Nada Libman, MD;  Location: [facility] OR;  Service: Vascular;  Laterality: Left;   INCISION AND DRAINAGE Left 07/05/2022   Procedure: LEFT GROIN WASHOUT;  Surgeon: Nada Libman, MD;  Location: [facility] OR;  Service: Vascular;  Laterality: Left;   JOINT REPLACEMENT Right 07/31/2005   LOOP RECORDER INSERTION N/A 08/15/2023   Procedure: LOOP RECORDER INSERTION;  Surgeon: Graciella Freer, PA-C;  Location: [facility];  Service: Cardiovascular;  Laterality: N/A;   LOWER EXTREMITY ANGIOGRAPHY N/A 01/26/2023   Procedure: Lower Extremity Angiography;  Surgeon: Yates Decamp, MD;  Location: [facility] INVASIVE CV LAB;  Service: Cardiovascular;  Laterality: N/A;   LUMBAR LAMINECTOMY/DECOMPRESSION MICRODISCECTOMY Left 10/07/2015   Procedure: Lumbar three-five Decompressive lumbar Laminectomy & Left Lumbar four-five Microdiscectomy;  Surgeon: Shirlean Kelly, MD;  Location: [facility];  Service: Neurosurgery;  Laterality: Left;   LUMBAR SPINE SURGERY  2016   PATCH ANGIOPLASTY Left 07/05/2022   Procedure: VEIN PATCH ANGIOPLASTY;  Surgeon: Nada Libman, MD;  Location: [facility];  Service: Vascular;  Laterality: Left;   TONSILLECTOMY     TOTAL HIP ARTHROPLASTY  07/31/2005   Right   TUBAL LIGATION     Patient Active Problem List   Diagnosis Date Noted   TIA (transient ischemic attack) 08/14/2023   Solitary pulmonary  nodule on lung CT 02/12/2023   Postoperative seroma involving circulatory system after other circulatory system procedure 07/05/2022   H/O left femoral artery endarterectomy 06/02/2022 06/05/2022   PAD (peripheral artery disease) (HCC) 06/02/2022   Claudication (HCC)    Peripheral artery disease (HCC)    Rheumatoid arthritis (HCC) 12/21/2021   Primary hypertension 12/25/2019   Lumbar stenosis with neurogenic claudication 10/07/2015   COPD GOLD 2  01/20/2011   DOE (dyspnea on exertion) 12/29/2010    ONSET DATE: 08/14/23  REFERRING DIAG: G45.9 (ICD-10-CM) - TIA (transient ischemic attack)  THERAPY DIAG:  Dizziness and giddiness  Unsteadiness on feet  Rationale for Evaluation and Treatment: Rehabilitation  SUBJECTIVE:                                                                                                                                                                                             SUBJECTIVE STATEMENT: Having a bad day today. Reports a HA and dizziness when turning her head. Reports that she was sitting at the computer before this appointment.   Pt accompanied by: self  PERTINENT HISTORY: 78 y.o. female with medical history significant of CKDIII, COPD, Depression,GERD, HLD, HTN,Rheumatoid Arthritis,PVD Who presented to [facility] ED with episode of difficulty with speech that lasted for 20 minutes  PAIN:  Are you having pain? Yes: NPRS scale: 4/10 Pain location: L temple Pain description: headache Aggravating factors: "movement" Relieving factors: nothing   PRECAUTIONS: None  RED FLAGS: None   WEIGHT BEARING RESTRICTIONS: No  FALLS: Has patient fallen in last 6 months? No  LIVING ENVIRONMENT: Lives with: lives with their spouse Lives in: House/apartment Stairs:  yes, flight to 2nd floor but has ground-floor set-up. Exterior stairs with BHR Has following equipment at home: Single point cane and typically does not use AD  PLOF: Independent with basic  ADLs  PATIENT GOALS: eliminate symptoms to PLOF  OBJECTIVE:  TODAY'S TREATMENT: 08/28/23 Activity Comments  L sidelying test Negative; dizziness upon sitting up  R sidelying test Negative; less dizziness upon sitting up  DGI 13/24   Gait with SPC 10ft Good sequencing, improved stability   Sitting head turns to targets 2x30" No dizziness; cues to pick up pace to allow for mild symptoms (pt reported 5/10 at quicker pace)  Sitting head nods to targets 2x30" No dizziness; cues to pick up pace to allow for mild symptoms (pt reported 4-5/10 at quicker pace)        M-CTSIB  Condition 1: Firm Surface, EO 30 Sec, Mild and Moderate Sway  Condition 2: Firm Surface, EC 30 Sec, Mild and Moderate Sway  Condition 3: Foam Surface, EO 30 Sec, Mild and Moderate Sway  Condition 4: Foam Surface, EC 13 Sec, Severe Sway      [facility] PT Assessment - 08/28/23 0001       Standardized Balance Assessment   Standardized Balance Assessment Dynamic Gait Index      Dynamic Gait Index   Level Surface Mild Impairment    Change in Gait Speed Moderate Impairment    Gait with Horizontal Head Turns Moderate Impairment    Gait with Vertical Head Turns Mild Impairment    Gait and Pivot Turn Mild Impairment   c/o dizziness   Step Over Obstacle Moderate Impairment    Step Around Obstacles Mild Impairment    Steps Mild Impairment    Total Score 13    DGI comment: required sit break d/t B LE pain             HOME EXERCISE PROGRAM Last updated: 08/28/23 Access Code: [email or web address] URL: [email or web address] Date: 08/28/2023 Prepared by: [facility] - Outpatient  Rehab - [facility]  Exercises - Narrow Stance with Counter Support  - 1 x daily - 5 x weekly - 2 sets - 30 sec hold - Brandt-Daroff Vestibular Exercise  - 1 x daily - 5 x weekly - 2 sets - 3-5 reps - Seated Left Head Turns Vestibular Habituation  - 1 x daily - 5 x weekly - 2-3 sets - 30 sec hold - Seated Head Nods Vestibular  Habituation  - 1 x daily - 5 x weekly - 2-3 sets - 30 sec hold   PATIENT EDUCATION: Education details: edu on fall risk and encouraged use of cane, edu on intended severity of symptoms with HEP, HEP with edu for safety Person educated: Patient Education method: Explanation, Demonstration, Tactile cues, Verbal cues, and Handouts Education comprehension: verbalized understanding and returned demonstration    Note: Objective measures were completed at Evaluation unless otherwise noted.  Vitals (Siting):  107/63 mmHg, 78 bpm,   DIAGNOSTIC FINDINGS:  IMPRESSION: 1. Chronic ischemic microangiopathy without acute intracranial abnormality. 2. Normal intracranial MRA. 3. Normal MRA of the neck.  COGNITION: Overall cognitive status: Within functional limits for tasks assessed   SENSATION: Not tested  COORDINATION: WFL  EDEMA:  none  MUSCLE TONE: NT    DTRs:  NT  POSTURE: rounded shoulders and forward head  LOWER EXTREMITY ROM:     WFL  LOWER EXTREMITY MMT:    Grossly 4/5 seated resisted tests  BED MOBILITY:  indep  TRANSFERS: indep   STAIRS: NT  GAIT: Gait pattern: antalgic Distance walked:  Assistive device utilized: None Level of assistance: Complete Independence Comments:         VESTIBULAR ASSESSMENT   GENERAL OBSERVATION: wears progressive lenses    SYMPTOM BEHAVIOR:   Subjective history:  sudden onset speech difficulty and dizziness, speech aspect resolved, dizziness remains   Non-Vestibular symptoms: none noted, feels nauseated with onset of symptoms   Type of dizziness: Blurred Vision, Diplopia, Spinning/Vertigo, and "Funny feeling in the head"   Frequency: daily   Duration: seconds-minutes   Aggravating factors: Spontaneous and Induced by position change: lying supine, rolling to the right, rolling to the left, supine to sit, and sit to stand   Relieving factors: slow movements   Progression of symptoms: unchanged   OCULOMOTOR  EXAM:   Ocular Alignment: normal   Ocular ROM: No Limitations   Spontaneous Nystagmus: absent   Gaze-Induced Nystagmus: absent   Smooth Pursuits: intact   Saccades: intact   Convergence/Divergence: 8 cm      VESTIBULAR - OCULAR REFLEX:    Slow VOR: Comment: feeling of dizziness horizontal > vertical   VOR Cancellation: Normal, dizziness after completion   Head-Impulse Test: negative   Dynamic Visual Acuity: Not able to be assessed      POSITIONAL TESTING: Right Dix-Hallpike: upbeating, right nystagmus Left Dix-Hallpike: no nystagmus Right Roll Test: no nystagmus Left Roll Test: no nystagmus    MOTION SENSITIVITY: --symptomatic with arising from test positions   OTHOSTATICS: not done  FUNCTIONAL GAIT: TBD   VESTIBULAR TREATMENT:  Canalith Repositioning:   Epley Right: Number of Reps: 1 Gaze Adaptation:   TBD Habituation:   TBD Other:                                                                                                                     TREATMENT DATE: 08/21/23    PATIENT EDUCATION: Education details: assessment details, rationale of intervention Person educated: Patient Education method: Explanation Education comprehension: verbalized understanding  HOME EXERCISE PROGRAM: TBD  GOALS: Goals reviewed with patient? Yes  SHORT TERM GOALS: Target date: same as LTG    LONG TERM GOALS: Target date: 10/03/2023    Patient will be independent in HEP to improve functional outcomes Baseline:  Goal status: IN PROGRESS  2.  Patient to be free of positional dizziness to reduce discomfort and risk for falls from dizziness Baseline: +R Dix-Hallpike Goal status:  IN PROGRESS  3.  Demo improved postural stability per mild sway x 30 sec Condition 4 M-CTSIB to improve safety with ADL Baseline: 13 sec 08/28/23 Goal status: IN PROGRESS 08/28/23  4.  Ambulate with low risk for falls per score 20/24 Dynamic Gait Index for improved safety Baseline: 13/24  08/28/23 Goal status:  IN PROGRESS 08/28/23    ASSESSMENT:  CLINICAL IMPRESSION: Patient arrived to session with report of a mild HA and dizziness with head turns today. Positional retesting was negative but revealed motion sensitivity. Balance testing revealed increased risk of falls according to DGI and decreased use of vestibular system with multisensory balance testing. Worked on habituating head movements and finding intended level of symptoms. Patient reported understanding of HEP and without complaints upon leaving.   OBJECTIVE IMPAIRMENTS: Abnormal gait, decreased activity tolerance, decreased balance, difficulty walking,  dizziness, and postural dysfunction.   ACTIVITY LIMITATIONS: carrying, bending, sleeping, bed mobility, reach over head, and locomotion level  PARTICIPATION LIMITATIONS: meal prep, cleaning, driving, and community activity  PERSONAL FACTORS: Age, Time since onset of injury/illness/exacerbation, and 3+ comorbidities: PMH  are also affecting patient's functional outcome.   REHAB POTENTIAL: Excellent  CLINICAL DECISION MAKING: Evolving/moderate complexity  EVALUATION COMPLEXITY: Moderate  PLAN:  PT FREQUENCY: 1-2x/week  PT DURATION: 6 weeks  PLANNED INTERVENTIONS: 97110-Therapeutic exercises, 97530- Therapeutic activity, 97112- Neuromuscular re-education, 97535- Self Care, [id number]- Manual therapy, [phone]- Gait training, [phone]- Canalith repositioning, [phone]- Aquatic Therapy, Patient/Family education, Balance training, Stair training, Taping, Dry Needling, Joint mobilization, Spinal mobilization, Vestibular training, and DME instructions  PLAN FOR NEXT SESSION: review/progress HEP    Baldemar Friday, PT, DPT 08/28/23 4:13 PM  [facility] at [facility] [address] [place], [place] [address] Phone # [phone] Fax # [phone]

## 2023-08-28 ENCOUNTER — Ambulatory Visit: Payer: Medicare Other | Admitting: Cardiology

## 2023-08-28 ENCOUNTER — Encounter: Payer: Self-pay | Admitting: Physical Therapy

## 2023-08-28 ENCOUNTER — Ambulatory Visit: Payer: Medicare Other | Admitting: Physical Therapy

## 2023-08-28 DIAGNOSIS — R2681 Unsteadiness on feet: Secondary | ICD-10-CM | POA: Diagnosis not present

## 2023-08-28 DIAGNOSIS — G459 Transient cerebral ischemic attack, unspecified: Secondary | ICD-10-CM | POA: Diagnosis not present

## 2023-08-28 DIAGNOSIS — R42 Dizziness and giddiness: Secondary | ICD-10-CM | POA: Diagnosis not present

## 2023-08-30 ENCOUNTER — Ambulatory Visit: Payer: Medicare Other

## 2023-08-30 DIAGNOSIS — R42 Dizziness and giddiness: Secondary | ICD-10-CM | POA: Diagnosis not present

## 2023-08-30 DIAGNOSIS — M810 Age-related osteoporosis without current pathological fracture: Secondary | ICD-10-CM | POA: Diagnosis not present

## 2023-08-30 DIAGNOSIS — R2681 Unsteadiness on feet: Secondary | ICD-10-CM | POA: Diagnosis not present

## 2023-08-30 DIAGNOSIS — E785 Hyperlipidemia, unspecified: Secondary | ICD-10-CM | POA: Diagnosis not present

## 2023-08-30 DIAGNOSIS — R7309 Other abnormal glucose: Secondary | ICD-10-CM | POA: Diagnosis not present

## 2023-08-30 DIAGNOSIS — G459 Transient cerebral ischemic attack, unspecified: Secondary | ICD-10-CM | POA: Diagnosis not present

## 2023-08-30 NOTE — Therapy (Signed)
OUTPATIENT PHYSICAL THERAPY NEURO TREATMENT   Patient Name: Donna [name] MRN: [id number] DOB:11-14-1945, 78 y.o., female Today's Date: 08/30/2023   PCP: Merri Brunette, MD REFERRING PROVIDER: Hughie Closs, MD  END OF SESSION:  PT End of Session - 08/30/23 1534     Visit Number 3    Number of Visits 12    Date for PT Re-Evaluation 10/03/23    Authorization Type Medicare    Progress Note Due on Visit 10    PT Start Time 1530    PT Stop Time 1615    PT Time Calculation (min) 45 min    Equipment Utilized During Treatment Gait belt    Activity Tolerance Patient tolerated treatment well    Behavior During Therapy WFL for tasks assessed/performed              Past Medical History:  Diagnosis Date   Arthritis    lumbar, toes, L knee, cerv. spine & hands  - rheumatoid   Cancer (HCC)    basal cell removed fr. 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 Gillermina Phy, but prev. saw Dr. Jacinto Halim   Peripheral vascular disease [facility])    Seasonal allergies    Past Surgical History:  Procedure Laterality Date   ABDOMINAL AORTOGRAM W/LOWER EXTREMITY N/A 05/30/2022   Procedure: ABDOMINAL AORTOGRAM W/LOWER EXTREMITY;  Surgeon: Yates Decamp, MD;  Location: [facility] IN[facility];  Service: Cardiovascular;  Laterality: N/A;   APPLICATION OF WOUND VAC Left 07/05/2022   Procedure: APPLICATION OF WOUND VAC;  Surgeon: Nada Libman, MD;  Location: [facility] OR;  Service: Vascular;  Laterality: Left;   APPLICATION OF WOUND VAC Left 07/05/2022   Procedure: APPLICATION OF WOUND VAC;  Surgeon: Nada Libman, MD;  Location: [facility] OR;   Service: Vascular;  Laterality: Left;   BACK SURGERY  07/31/1992   cerv. spine fusion    BREAST ENHANCEMENT SURGERY     BREAST SURGERY  04/01/1979   implants- bilateral    CATARACT EXTRACTION Left 12/2022   CERVICAL SPINE SURGERY  07/31/1990   fusion   ENDARTERECTOMY FEMORAL Left 06/02/2022   Procedure: LEFT ENDARTERECTOMY FEMORAL with BOVINE PATCH;  Surgeon: Nada Libman, MD;  Location: [facility] OR;  Service: Vascular;  Laterality: Left;   GROIN DEBRIDEMENT Left 07/05/2022   Procedure: LEFT Marden Noble WITH STIMULAN ANTIBIOTIC BEAD AND KERECIS FISH SKIN GRAFT APPLICATION;  Surgeon: Nada Libman, MD;  Location: [facility] OR;  Service: Vascular;  Laterality: Left;   INCISION AND DRAINAGE Left 07/05/2022   Procedure: LEFT GROIN WASHOUT;  Surgeon: Nada Libman, MD;  Location: [facility] OR;  Service: Vascular;  Laterality: Left;   JOINT REPLACEMENT Right 07/31/2005   LOOP RECORDER INSERTION N/A 08/15/2023   Procedure: LOOP RECORDER INSERTION;  Surgeon: Graciella Freer, PA-C;  Location: [facility];  Service: Cardiovascular;  Laterality: N/A;   LOWER EXTREMITY ANGIOGRAPHY N/A 01/26/2023   Procedure: Lower Extremity Angiography;  Surgeon: Yates Decamp, MD;  Location: [facility] INVASIVE CV LAB;  Service: Cardiovascular;  Laterality: N/A;   LUMBAR LAMINECTOMY/DECOMPRESSION MICRODISCECTOMY Left 10/07/2015   Procedure: Lumbar three-five Decompressive lumbar Laminectomy & Left Lumbar four-five Microdiscectomy;  Surgeon: Shirlean Kelly, MD;  Location: [facility] NEURO [facility];  Service: Neurosurgery;  Laterality: Left;   LUMBAR SPINE SURGERY  2016   PATCH ANGIOPLASTY Left 07/05/2022   Procedure: VEIN PATCH ANGIOPLASTY;  Surgeon: Nada Libman, MD;  Location: [facility] OR;  Service: Vascular;  Laterality: Left;   TONSILLECTOMY     TOTAL HIP ARTHROPLASTY  07/31/2005   Right   TUBAL LIGATION     Patient Active Problem List   Diagnosis Date Noted   TIA (transient ischemic attack) 08/14/2023   Solitary pulmonary  nodule on lung CT 02/12/2023   Postoperative seroma involving circulatory system after other circulatory system procedure 07/05/2022   H/O left femoral artery endarterectomy 06/02/2022 06/05/2022   PAD (peripheral artery disease) (HCC) 06/02/2022   Claudication (HCC)    Peripheral artery disease (HCC)    Rheumatoid arthritis (HCC) 12/21/2021   Primary hypertension 12/25/2019   Lumbar stenosis with neurogenic claudication 10/07/2015   COPD GOLD 2  01/20/2011   DOE (dyspnea on exertion) 12/29/2010    ONSET DATE: 08/14/23  REFERRING DIAG: G45.9 (ICD-10-CM) - TIA (transient ischemic attack)  THERAPY DIAG:  Dizziness and giddiness  Unsteadiness on feet  Rationale for Evaluation and Treatment: Rehabilitation  SUBJECTIVE:                                                                                                                                                                                             SUBJECTIVE STATEMENT: Having dizziness/lightheadedness when arising  Pt accompanied by: self  PERTINENT HISTORY: 78 y.o. female with medical history significant of CKDIII, COPD, Depression,GERD, HLD, HTN,Rheumatoid Arthritis,PVD Who presented to [facility] ED with episode of difficulty with speech that lasted for 20 minutes  PAIN:  Are you having pain? Yes: NPRS scale: 4/10 Pain location: L temple Pain description: headache Aggravating factors: "movement" Relieving factors: nothing   PRECAUTIONS: None  RED FLAGS: None   WEIGHT BEARING RESTRICTIONS: No  FALLS: Has patient fallen in last 6 months? No  LIVING ENVIRONMENT: Lives with: lives with their spouse Lives in: House/apartment Stairs:  yes, flight to 2nd floor but has ground-floor set-up. Exterior stairs with BHR Has following equipment at home: Single point cane and typically does not use AD  PLOF: Independent with basic ADLs  PATIENT GOALS: eliminate symptoms to PLOF  OBJECTIVE:   TODAY'S TREATMENT:  08/30/23 Activity Comments  orthostatics Supine x 5 min: 104/62 mmHg, 73 bpm Standing x 1 min: 101/55 mmHg, 82 bpm Standing x  3 min: 100/63 mmHg, 85 bpm  Right Dix-Hallpike negative  Left Dix-Hallpike negative  Seated VOR x 1 Use of metronome for frequency notes blurred vision with 100-120 bps, reduced to 80 with improved gaze stabilization  HEP review   Standing balance Eyes closed x 30 sec Head turns Eo/EC 2x requiring hand support for eyes closed conditions        TODAY'S TREATMENT: 08/28/23 Activity Comments  L sidelying test Negative; dizziness upon sitting up  R sidelying test Negative; less dizziness upon sitting up  DGI 13/24   Gait with SPC 24ft Good sequencing, improved stability   Sitting head turns to targets 2x30" No dizziness; cues to pick up pace to allow for mild symptoms (pt reported 5/10 at quicker pace)  Sitting head nods to targets 2x30" No dizziness; cues to pick up pace to allow for mild symptoms (pt reported 4-5/10 at quicker pace)        M-CTSIB  Condition 1: Firm Surface, EO 30 Sec, Mild and Moderate Sway  Condition 2: Firm Surface, EC 30 Sec, Mild and Moderate Sway  Condition 3: Foam Surface, EO 30 Sec, Mild and Moderate Sway  Condition 4: Foam Surface, EC 13 Sec, Severe Sway        HOME EXERCISE PROGRAM Last updated: 08/28/23 Access Code: [email or web address] URL: [email or web address] Date: 08/28/2023 Prepared by: [facility] - Outpatient  Rehab - [facility]  Exercises - Narrow Stance with Counter Support  - 1 x daily - 5 x weekly - 2 sets - 30 sec hold - Brandt-Daroff Vestibular Exercise  - 1 x daily - 5 x weekly - 2 sets - 3-5 reps - Seated Left Head Turns Vestibular Habituation  - 1 x daily - 5 x weekly - 2-3 sets - 30 sec hold - Seated Head Nods Vestibular Habituation  - 1 x daily - 5 x weekly - 2-3 sets - 30 sec hold - Corner Balance Feet Together With Eyes Closed  - 1 x daily - 7 x weekly - 3 sets - 15-30 sec hold - Corner  Balance Feet Together: Eyes Open With Head Turns  - 1 x daily - 7 x weekly - 3 sets - 2-3 reps - Corner Balance Feet Together: Eyes Closed With Head Turns  - 1 x daily - 7 x weekly - 3 sets - 2-3 reps  PATIENT EDUCATION: Education details: edu on fall risk and encouraged use of cane, edu on intended severity of symptoms with HEP, HEP with edu for safety Person educated: Patient Education method: Explanation, Demonstration, Tactile cues, Verbal cues, and Handouts Education comprehension: verbalized understanding and returned demonstration    Note: Objective measures were completed at Evaluation unless otherwise noted.  Vitals (Siting):  107/63 mmHg, 78 bpm,   DIAGNOSTIC FINDINGS:  IMPRESSION: 1. Chronic ischemic microangiopathy without acute intracranial abnormality. 2. Normal intracranial MRA. 3. Normal MRA of the neck.  COGNITION: Overall cognitive status: Within functional limits for tasks assessed   SENSATION: Not tested  COORDINATION: WFL  EDEMA:  none  MUSCLE TONE: NT    DTRs:  NT  POSTURE: rounded shoulders and forward head  LOWER EXTREMITY ROM:     WFL  LOWER EXTREMITY MMT:    Grossly 4/5 seated resisted tests  BED MOBILITY:  indep  TRANSFERS: indep   STAIRS: NT  GAIT: Gait pattern: antalgic Distance walked:  Assistive device utilized: None Level of assistance: Complete Independence Comments:         VESTIBULAR ASSESSMENT   GENERAL OBSERVATION:  wears progressive lenses    SYMPTOM BEHAVIOR:   Subjective history: sudden onset speech difficulty and dizziness, speech aspect resolved, dizziness remains   Non-Vestibular symptoms: none noted, feels nauseated with onset of symptoms   Type of dizziness: Blurred Vision, Diplopia, Spinning/Vertigo, and "Funny feeling in the head"   Frequency: daily   Duration: seconds-minutes   Aggravating factors: Spontaneous and Induced by position change: lying supine, rolling to the right, rolling to the  left, supine to sit, and sit to stand   Relieving factors: slow movements   Progression of symptoms: unchanged   OCULOMOTOR EXAM:   Ocular Alignment: normal   Ocular ROM: No Limitations   Spontaneous Nystagmus: absent   Gaze-Induced Nystagmus: absent   Smooth Pursuits: intact   Saccades: intact   Convergence/Divergence: 8 cm      VESTIBULAR - OCULAR REFLEX:    Slow VOR: Comment: feeling of dizziness horizontal > vertical   VOR Cancellation: Normal, dizziness after completion   Head-Impulse Test: negative   Dynamic Visual Acuity: Not able to be assessed      POSITIONAL TESTING: Right Dix-Hallpike: upbeating, right nystagmus Left Dix-Hallpike: no nystagmus Right Roll Test: no nystagmus Left Roll Test: no nystagmus    MOTION SENSITIVITY: --symptomatic with arising from test positions   OTHOSTATICS: not done  FUNCTIONAL GAIT: TBD   VESTIBULAR TREATMENT:  Canalith Repositioning:   Epley Right: Number of Reps: 1 Gaze Adaptation:   TBD Habituation:   TBD Other:                                                                                                                     TREATMENT DATE: 08/21/23    PATIENT EDUCATION: Education details: assessment details, rationale of intervention Person educated: Patient Education method: Explanation Education comprehension: verbalized understanding  HOME EXERCISE PROGRAM: TBD  GOALS: Goals reviewed with patient? Yes  SHORT TERM GOALS: Target date: same as LTG    LONG TERM GOALS: Target date: 10/03/2023    Patient will be independent in HEP to improve functional outcomes Baseline:  Goal status: IN PROGRESS  2.  Patient to be free of positional dizziness to reduce discomfort and risk for falls from dizziness Baseline: +R Dix-Hallpike Goal status:  IN PROGRESS  3.  Demo improved postural stability per mild sway x 30 sec Condition 4 M-CTSIB to improve safety with ADL Baseline: 13 sec 08/28/23 Goal status: IN  PROGRESS 08/28/23  4.  Ambulate with low risk for falls per score 20/24 Dynamic Gait Index for improved safety Baseline: 13/24 08/28/23 Goal status:  IN PROGRESS 08/28/23    ASSESSMENT:  CLINICAL IMPRESSION: Reports experience of lightheadedness/dizziness with positional changes, thus checking orthostatics with unremarkable measures other than lower than average pressure at baseline but no demonstrable drop.  Repeat of Dix-Hallpike position without dizziness or nystagmus present.  VOR x 1 at higher speed causes blurred vision/oscillopsia which is improved by slower speed of 80 bps.  Difficulty with static balance under eyes closed and head movement conditions  requiring hand support.  Progressing well with activities and anticipate D/C after next session to instruct in activity progression   OBJECTIVE IMPAIRMENTS: Abnormal gait, decreased activity tolerance, decreased balance, difficulty walking, dizziness, and postural dysfunction.   ACTIVITY LIMITATIONS: carrying, bending, sleeping, bed mobility, reach over head, and locomotion level  PARTICIPATION LIMITATIONS: meal prep, cleaning, driving, and community activity  PERSONAL FACTORS: Age, Time since onset of injury/illness/exacerbation, and 3+ comorbidities: PMH  are also affecting patient's functional outcome.   REHAB POTENTIAL: Excellent  CLINICAL DECISION MAKING: Evolving/moderate complexity  EVALUATION COMPLEXITY: Moderate  PLAN:  PT FREQUENCY: 1-2x/week  PT DURATION: 6 weeks  PLANNED INTERVENTIONS: 97110-Therapeutic exercises, 97530- Therapeutic activity, 97112- Neuromuscular re-education, 97535- Self Care, [id number]- Manual therapy, [phone]- Gait training, [phone]- Canalith repositioning, [phone]- Aquatic Therapy, Patient/Family education, Balance training, Stair training, Taping, Dry Needling, Joint mobilization, Spinal mobilization, Vestibular training, and DME instructions  PLAN FOR NEXT SESSION: review/progress HEP and anticipate D/C  due to more pressing medical procedures upcoming    4:28 PM, 08/30/23 M. Shary Decamp, PT, DPT Physical Therapist- [place] Office Number: [phone]

## 2023-09-04 ENCOUNTER — Ambulatory Visit: Payer: Medicare Other | Admitting: Physical Therapy

## 2023-09-04 NOTE — Therapy (Incomplete)
OUTPATIENT PHYSICAL THERAPY NEURO TREATMENT   Patient Name: Donna [name] MRN: [id number] DOB:[birth year], 78 y.o., female Today's Date: 09/04/2023   PCP: Merri Brunette, MD REFERRING PROVIDER: Hughie Closs, MD  END OF SESSION:     Past Medical History:  Diagnosis Date   Arthritis    lumbar, toes, L knee, cerv. spine & hands  - rheumatoid   Cancer (HCC)    basal cell removed fr. 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 Gillermina Phy, but prev. saw Dr. Jacinto Halim   Peripheral vascular disease [facility])    Seasonal allergies    Past Surgical History:  Procedure Laterality Date   ABDOMINAL AORTOGRAM W/LOWER EXTREMITY N/A 05/30/2022   Procedure: ABDOMINAL AORTOGRAM W/LOWER EXTREMITY;  Surgeon: Yates Decamp, MD;  Location: [facility] INVASIVE CV LAB;  Service: Cardiovascular;  Laterality: N/A;   APPLICATION OF WOUND VAC Left 07/05/2022   Procedure: APPLICATION OF WOUND VAC;  Surgeon: Nada Libman, MD;  Location: [facility] OR;  Service: Vascular;  Laterality: Left;   APPLICATION OF WOUND VAC Left 07/05/2022   Procedure: APPLICATION OF WOUND VAC;  Surgeon: Nada Libman, MD;  Location: [facility] OR;  Service: Vascular;  Laterality: Left;   BACK SURGERY  07/31/1992   cerv. spine fusion    BREAST ENHANCEMENT SURGERY     BREAST SURGERY  04/01/1979   implants- bilateral    CATARACT EXTRACTION Left 12/2022   CERVICAL SPINE SURGERY  07/31/1990   fusion   ENDARTERECTOMY FEMORAL Left 06/02/2022   Procedure: LEFT ENDARTERECTOMY FEMORAL with BOVINE PATCH;  Surgeon: Nada Libman, MD;  Location: [facility] OR;  Service: Vascular;  Laterality: Left;    GROIN DEBRIDEMENT Left 07/05/2022   Procedure: LEFT Marden Noble WITH STIMULAN ANTIBIOTIC BEAD AND KERECIS FISH SKIN GRAFT APPLICATION;  Surgeon: Nada Libman, MD;  Location: [facility] OR;  Service: Vascular;  Laterality: Left;   INCISION AND DRAINAGE Left 07/05/2022   Procedure: LEFT GROIN WASHOUT;  Surgeon: Nada Libman, MD;  Location: [facility] OR;  Service: Vascular;  Laterality: Left;   JOINT REPLACEMENT Right 07/31/2005   LOOP RECORDER INSERTION N/A 08/15/2023   Procedure: LOOP RECORDER INSERTION;  Surgeon: Graciella Freer, PA-C;  [facility]: [facility];  Service: Cardiovascular;  Laterality: N/A;   LOWER EXTREMITY ANGIOGRAPHY N/A 01/26/2023   Procedure: Lower Extremity Angiography;  Surgeon: Yates Decamp, MD;  Location: [facility] INVASIVE CV LAB;  Service: Cardiovascular;  Laterality: N/A;   LUMBAR LAMINECTOMY/DECOMPRESSION MICRODISCECTOMY Left 10/07/2015   Procedure: Lumbar three-five Decompressive lumbar Laminectomy & Left Lumbar four-five Microdiscectomy;  Surgeon: Shirlean Kelly, MD;  Location: [facility] NEURO [facility]S;  Service: Neurosurgery;  Laterality: Left;   LUMBAR SPINE SURGERY  2016   [facility] ANGIOPLASTY Left 07/05/2022   Procedure: VEIN PATCH ANGIOPLASTY;  Surgeon: Nada Libman, MD;  Location: [facility] OR;  Service: Vascular;  Laterality: Left;   TONSILLECTOMY     TOTAL HIP ARTHROPLASTY  07/31/2005   Right   TUBAL LIGATION     Patient Active Problem List  Diagnosis Date Noted   TIA (transient ischemic attack) 08/14/2023   Solitary pulmonary nodule on lung CT 02/12/2023   Postoperative seroma involving circulatory system after other circulatory system procedure 07/05/2022   H/O left femoral artery endarterectomy 06/02/2022 06/05/2022   PAD (peripheral artery disease) (HCC) 06/02/2022   Claudication (HCC)    Peripheral artery disease (HCC)    Rheumatoid arthritis (HCC) 12/21/2021   Primary hypertension 12/25/2019   Lumbar stenosis with neurogenic claudication 10/07/2015   COPD  GOLD 2  01/20/2011   DOE (dyspnea on exertion) 12/29/2010    ONSET DATE: 08/14/23  REFERRING DIAG: G45.9 (ICD-10-CM) - TIA (transient ischemic attack)  THERAPY DIAG:  No diagnosis found.  Rationale for Evaluation and Treatment: Rehabilitation  SUBJECTIVE:                                                                                                                                                                                             SUBJECTIVE STATEMENT: ***Having dizziness/lightheadedness when arising  Pt accompanied by: self  PERTINENT HISTORY: 78 y.o. female with medical history significant of CKDIII, COPD, Depression,GERD, HLD, HTN,Rheumatoid Arthritis,PVD Who presented to [facility] ED with episode of difficulty with speech that lasted for 20 minutes  PAIN:  Are you having pain? Yes: NPRS scale: 4/10 Pain location: L temple Pain description: headache Aggravating factors: "movement" Relieving factors: nothing   PRECAUTIONS: None  RED FLAGS: None   WEIGHT BEARING RESTRICTIONS: No  FALLS: Has patient fallen in last 6 months? No  LIVING ENVIRONMENT: Lives with: lives with their spouse Lives in: House/apartment Stairs:  yes, flight to 2nd floor but has ground-floor set-up. Exterior stairs with BHR Has following equipment at home: Single point cane and typically does not use AD  PLOF: Independent with basic ADLs  PATIENT GOALS: eliminate symptoms to PLOF  OBJECTIVE:    TODAY'S TREATMENT: 09/04/2023 Activity Comments                      TODAY'S TREATMENT: 08/30/23 Activity Comments  orthostatics Supine x 5 min: 104/62 mmHg, 73 bpm Standing x 1 min: 101/55 mmHg, 82 bpm Standing x 3 min: 100/63 mmHg, 85 bpm  Right Dix-Hallpike negative  Left Dix-Hallpike negative  Seated VOR x 1 Use of metronome for frequency notes blurred vision with 100-120 bps, reduced to 80 with improved gaze stabilization  HEP review   Standing balance Eyes closed x 30 sec Head  turns Eo/EC 2x requiring hand support for eyes closed conditions        TODAY'S TREATMENT: 08/28/23 Activity Comments  L sidelying test Negative; dizziness upon sitting up  R sidelying test Negative; less dizziness upon sitting up  DGI 13/24   Gait with SPC 44ft Good sequencing, improved stability   Sitting head turns to targets 2x30" No dizziness; cues to pick up pace to allow for mild symptoms (pt reported 5/10 at quicker pace)  Sitting head nods to targets 2x30" No dizziness; cues to pick up pace to allow for mild symptoms (pt reported 4-5/10 at quicker pace)        M-CTSIB  Condition 1: Firm Surface, EO 30 Sec, Mild and Moderate Sway  Condition 2: Firm Surface, EC 30 Sec, Mild and Moderate Sway  Condition 3: Foam Surface, EO 30 Sec, Mild and Moderate Sway  Condition 4: Foam Surface, EC 13 Sec, Severe Sway        HOME EXERCISE PROGRAM Last updated: 08/28/23 Access Code: [email or web address] URL: [email or web address] Date: 08/28/2023 Prepared by: [facility] - Outpatient  Rehab - [facility]  Exercises - Narrow Stance with Counter Support  - 1 x daily - 5 x weekly - 2 sets - 30 sec hold - Brandt-Daroff Vestibular Exercise  - 1 x daily - 5 x weekly - 2 sets - 3-5 reps - Seated Left Head Turns Vestibular Habituation  - 1 x daily - 5 x weekly - 2-3 sets - 30 sec hold - Seated Head Nods Vestibular Habituation  - 1 x daily - 5 x weekly - 2-3 sets - 30 sec hold - Corner Balance Feet Together With Eyes Closed  - 1 x daily - 7 x weekly - 3 sets - 15-30 sec hold - Corner Balance Feet Together: Eyes Open With Head Turns  - 1 x daily - 7 x weekly - 3 sets - 2-3 reps - Corner Balance Feet Together: Eyes Closed With Head Turns  - 1 x daily - 7 x weekly - 3 sets - 2-3 reps  PATIENT EDUCATION: Education details: edu on fall risk and encouraged use of cane, edu on intended severity of symptoms with HEP, HEP with edu for safety Person educated: Patient Education method:  Explanation, Demonstration, Tactile cues, Verbal cues, and Handouts Education comprehension: verbalized understanding and returned demonstration    Note: Objective measures were completed at Evaluation unless otherwise noted.  Vitals (Siting):  107/63 mmHg, 78 bpm,   DIAGNOSTIC FINDINGS:  IMPRESSION: 1. Chronic ischemic microangiopathy without acute intracranial abnormality. 2. Normal intracranial MRA. 3. Normal MRA of the neck.  COGNITION: Overall cognitive status: Within functional limits for tasks assessed   SENSATION: Not tested  COORDINATION: WFL  EDEMA:  none  MUSCLE TONE: NT    DTRs:  NT  POSTURE: rounded shoulders and forward head  LOWER EXTREMITY ROM:     WFL  LOWER EXTREMITY MMT:    Grossly 4/5 seated resisted tests  BED MOBILITY:  indep  TRANSFERS: indep   STAIRS: NT  GAIT: Gait pattern: antalgic Distance walked:  Assistive device utilized: None Level of assistance: Complete Independence Comments:         VESTIBULAR ASSESSMENT   GENERAL OBSERVATION: wears progressive lenses    SYMPTOM BEHAVIOR:   Subjective history: sudden onset speech difficulty and dizziness, speech aspect resolved, dizziness remains   Non-Vestibular symptoms: none noted, feels nauseated with onset of symptoms   Type of dizziness: Blurred Vision, Diplopia, Spinning/Vertigo, and "Funny feeling in the head"   Frequency: daily   Duration: seconds-minutes   Aggravating factors: Spontaneous and Induced by position change: lying supine, rolling to the right, rolling to the left, supine to sit, and  sit to stand   Relieving factors: slow movements   Progression of symptoms: unchanged   OCULOMOTOR EXAM:   Ocular Alignment: normal   Ocular ROM: No Limitations   Spontaneous Nystagmus: absent   Gaze-Induced Nystagmus: absent   Smooth Pursuits: intact   Saccades: intact   Convergence/Divergence: 8 cm      VESTIBULAR - OCULAR REFLEX:    Slow VOR: Comment:  feeling of dizziness horizontal > vertical   VOR Cancellation: Normal, dizziness after completion   Head-Impulse Test: negative   Dynamic Visual Acuity: Not able to be assessed      POSITIONAL TESTING: Right Dix-Hallpike: upbeating, right nystagmus Left Dix-Hallpike: no nystagmus Right Roll Test: no nystagmus Left Roll Test: no nystagmus    MOTION SENSITIVITY: --symptomatic with arising from test positions   OTHOSTATICS: not done  FUNCTIONAL GAIT: TBD   VESTIBULAR TREATMENT:  Canalith Repositioning:   Epley Right: Number of Reps: 1 Gaze Adaptation:   TBD Habituation:   TBD Other:                                                                                                                     TREATMENT DATE: 08/21/23    PATIENT EDUCATION: Education details: assessment details, rationale of intervention Person educated: Patient Education method: Explanation Education comprehension: verbalized understanding  HOME EXERCISE PROGRAM: TBD  GOALS: Goals reviewed with patient? Yes  SHORT TERM GOALS: Target date: same as LTG    LONG TERM GOALS: Target date: 10/03/2023    Patient will be independent in HEP to improve functional outcomes Baseline:  Goal status: IN PROGRESS  2.  Patient to be free of positional dizziness to reduce discomfort and risk for falls from dizziness Baseline: +R Dix-Hallpike Goal status:  IN PROGRESS  3.  Demo improved postural stability per mild sway x 30 sec Condition 4 M-CTSIB to improve safety with ADL Baseline: 13 sec 08/28/23 Goal status: IN PROGRESS 08/28/23  4.  Ambulate with low risk for falls per score 20/24 Dynamic Gait Index for improved safety Baseline: 13/24 08/28/23 Goal status:  IN PROGRESS 08/28/23    ASSESSMENT:  CLINICAL IMPRESSION: Pt presents today ***. Skilled PT session focused on ***. Pt needs ***. Pt will continue to benefit from skilled PT towards goals for improved functional mobility and decreased fall  risk.   Reports experience of lightheadedness/dizziness with positional changes, thus checking orthostatics with unremarkable measures other than lower than average pressure at baseline but no demonstrable drop.  Repeat of Dix-Hallpike position without dizziness or nystagmus present.  VOR x 1 at higher speed causes blurred vision/oscillopsia which is improved by slower speed of 80 bps.  Difficulty with static balance under eyes closed and head movement conditions requiring hand support.  Progressing well with activities and anticipate D/C after next session to instruct in activity progression   OBJECTIVE IMPAIRMENTS: Abnormal gait, decreased activity tolerance, decreased balance, difficulty walking, dizziness, and postural dysfunction.   ACTIVITY LIMITATIONS: carrying, bending, sleeping, bed mobility, reach over head, and  locomotion level  PARTICIPATION LIMITATIONS: meal prep, cleaning, driving, and community activity  PERSONAL FACTORS: Age, Time since onset of injury/illness/exacerbation, and 3+ comorbidities: PMH  are also affecting patient's functional outcome.   REHAB POTENTIAL: Excellent  CLINICAL DECISION MAKING: Evolving/moderate complexity  EVALUATION COMPLEXITY: Moderate  PLAN:  PT FREQUENCY: 1-2x/week  PT DURATION: 6 weeks  PLANNED INTERVENTIONS: 97110-Therapeutic exercises, 97530- Therapeutic activity, 97112- Neuromuscular re-education, 97535- Self Care, [id number]- Manual therapy, [phone]- Gait training, [phone]- Canalith repositioning, [phone]- Aquatic Therapy, Patient/Family education, Balance training, Stair training, Taping, Dry Needling, Joint mobilization, Spinal mobilization, Vestibular training, and DME instructions  PLAN FOR NEXT SESSION: ***review/progress HEP and anticipate D/C due to more pressing medical procedures upcoming    Lonia Blood, PT 09/04/23 2:00 PM Phone: [phone] Fax: [phone]  [facility] Re[facility] at [facility] [address] [place], [place] [address] Phone # [phone] Fax # [phone]

## 2023-09-05 DIAGNOSIS — I700 Atherosclerosis of aorta: Secondary | ICD-10-CM | POA: Diagnosis not present

## 2023-09-05 DIAGNOSIS — R5383 Other fatigue: Secondary | ICD-10-CM | POA: Diagnosis not present

## 2023-09-05 DIAGNOSIS — F321 Major depressive disorder, single episode, moderate: Secondary | ICD-10-CM | POA: Diagnosis not present

## 2023-09-05 DIAGNOSIS — N1832 Chronic kidney disease, stage 3b: Secondary | ICD-10-CM | POA: Diagnosis not present

## 2023-09-05 DIAGNOSIS — M810 Age-related osteoporosis without current pathological fracture: Secondary | ICD-10-CM | POA: Diagnosis not present

## 2023-09-05 DIAGNOSIS — I10 Essential (primary) hypertension: Secondary | ICD-10-CM | POA: Diagnosis not present

## 2023-09-05 DIAGNOSIS — Z0000 Encounter for general adult medical examination without abnormal findings: Secondary | ICD-10-CM | POA: Diagnosis not present

## 2023-09-05 DIAGNOSIS — J449 Chronic obstructive pulmonary disease, unspecified: Secondary | ICD-10-CM | POA: Diagnosis not present

## 2023-09-05 DIAGNOSIS — M0579 Rheumatoid arthritis with rheumatoid factor of multiple sites without organ or systems involvement: Secondary | ICD-10-CM | POA: Diagnosis not present

## 2023-09-05 DIAGNOSIS — K219 Gastro-esophageal reflux disease without esophagitis: Secondary | ICD-10-CM | POA: Diagnosis not present

## 2023-09-05 DIAGNOSIS — G629 Polyneuropathy, unspecified: Secondary | ICD-10-CM | POA: Diagnosis not present

## 2023-09-05 DIAGNOSIS — D84821 Immunodeficiency due to drugs: Secondary | ICD-10-CM | POA: Diagnosis not present

## 2023-09-06 ENCOUNTER — Ambulatory Visit: Payer: Medicare Other | Admitting: Physical Therapy

## 2023-09-17 ENCOUNTER — Telehealth: Payer: Self-pay

## 2023-09-17 NOTE — Telephone Encounter (Signed)
5 AF episodes per counters,   1 on episode list, 09/15/23 at 16:01, 4 min, cannot exclude true atrial arrhythmia vs SR with PACs/sinus arrhythmia with noisy baseline, no discernible P waves; implant indication of cryptogenic stroke.  Not on [facility] Reviewed with Dr. Elberta Fortis - due to noisy baseline can't determine whether true event or not, as very short ( ) will continue to monitor.

## 2023-09-17 NOTE — Progress Notes (Unsigned)
Patient: Donna [name] Date of Birth: Apr 28, 1946  Reason for Visit: Stroke Clinic Follow Up History from: Patient Primary Neurologist: Pearlean Brownie  ASSESSMENT AND PLAN 78 y.o. year old female with left-sided TIA.  Presented with episode of aphasia and word finding.  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  Vascular risk factors: HLD, HTN.  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  Normal MRA head and neck.  -Follow-up today with primary care/urgent care about flu, cough, congestion. BP is low today, evidence of orthostatic changes today. Hold BP medications until can follow up with PCP, has been having dizziness -Continue Plavix 75 mg daily for secondary stroke prevention -Cardiology is following your loop recorder readings. I will forward over to Dr. Elberta Fortis. Recent reading showed possible AFIB episode but to much background activity to say for sure. At time of reading, recovering from the flu -Follow-up with me in 6 months or sooner if needed  HISTORY OF PRESENT ILLNESS: Today 09/18/23 Loop recorder report yesterday showed possible episode of AFIB but background artifact will continue to monitor. Has had the flu last week, still dealing with congestion, cough, PCP started Tessalon. Post admission, mentions having dizzy spells often, whether standing or sitting. Occasional headache nothing to worry her. Today BP 105/60, at home has been running 70-80's systolic. Before admission BP was high, struggling to get in a normal range.She has stopped the Norvasc, Benicar herself few days. Remains on bisoprolol, chlorthalidone. Needs refill on Plavix. No new neuro symptoms. Feels extremely tired, having to take a nap. Has severe RA, sometimes doesn't feel well from this. She drives, is social. Sees Dr. Jacinto Halim cardiology. Evidence of orthostatic changes today.   Orthostatic VS for the past 24 hrs (Last 3 readings):  BP- Lying Pulse- Lying BP- Standing at 3 minutes Pulse-  Standing at 3 minutes  09/18/23 1114 (!) 88/60 97 (!) 80/50 81   HISTORY  Presented 08/14/2023 with transient episode of aphasia and word finding difficulty.  Her husband drove her to the ER, symptoms resolved in 15 to 20 minutes.  MRI of the brain showed no acute stroke.  Cryptogenic stroke.  Loop recorder was placed.  -Code stroke CT head no acute abnormality.  Small vessel disease.  Atrophy. -MRI of the brain no acute abnormality.  Chronic ischemic microangiopathy -MRA head and neck no LVO or significant stenosis -2D echo EF 60 to 65%, -Loop recorder was placed 08/15/23 -LDL 43, on Crestor  -A1c 5.9 -Aspirin 81 mg daily prior to admission, DAPT aspirin 81 and Plavix 75 mg daily for 3 weeks, then Plavix alone -EEG was normal  REVIEW OF SYSTEMS: Out of a complete 14 system review of symptoms, the patient complains only of the following symptoms, and all other reviewed systems are negative.  See HPI  ALLERGIES: Allergies  Allergen Reactions   Thorazine [Chlorpromazine] Anaphylaxis    HOME MEDICATIONS: Outpatient Medications Prior to Visit  Medication Sig Dispense Refill   acetaminophen (TYLENOL) 325 MG tablet Take 650 mg by mouth daily as needed for moderate pain (pain score 4-6) or mild pain (pain score 1-3).     albuterol (PROVENTIL HFA;VENTOLIN HFA) 108 (90 Base) MCG/ACT inhaler Inhale 2 puffs into the lungs every 4 (four) hours as needed for wheezing or shortness of breath.     amLODipine (NORVASC) 10 MG tablet Take 1 tablet (10 mg total) by mouth daily. 90 tablet 3   bisoprolol (ZEBETA) 5 MG tablet TAKE 1 TABLET BY MOUTH DAILY (Patient taking  differently: Take 10 mg by mouth daily. TAKE 1 TABLET BY MOUTH DAILY) 90 tablet 3   BREZTRI AEROSPHERE 160-9-4.8 MCG/ACT AERO Inhale 2 puffs into the lungs in the morning and at bedtime.     buPROPion (WELLBUTRIN XL) 300 MG 24 hr tablet Take 300 mg by mouth daily.     certolizumab pegol (CIMZIA) 2 X 200 MG KIT Inject into the skin every 30  (thirty) days.     chlorthalidone (HYGROTON) 25 MG tablet Take 25 mg by mouth daily.     DULoxetine (CYMBALTA) 20 MG capsule Take 20 mg by mouth daily.     ipratropium (ATROVENT) 0.06 % nasal spray Place 1 spray into both nostrils in the morning. (Patient taking differently: Place 2-3 sprays into both nostrils in the morning. Running nose) 15 mL 11   leflunomide (ARAVA) 20 MG tablet Take 20 mg by mouth in the morning.     Multiple Vitamins-Minerals (MULTIVITAMIN WOMEN) TABS Take 2 tablets by mouth in the morning.     olmesartan-hydrochlorothiazide (BENICAR HCT) 40-25 MG tablet Take 1 tablet by mouth daily.     rosuvastatin (CRESTOR) 5 MG tablet Take 5 mg by mouth in the morning.     famotidine (PEPCID) 20 MG tablet Take 20 mg by mouth daily. (Patient not taking: Reported on 09/18/2023)     pantoprazole (PROTONIX) 40 MG tablet Take 40 mg by mouth daily. (Patient not taking: Reported on 09/18/2023)     No facility-administered medications prior to visit.    PAST MEDICAL HISTORY: Past Medical History:  Diagnosis Date   Arthritis    lumbar, toes, L knee, cerv. spine & hands  - rheumatoid   Cancer (HCC)    basal cell removed fr. 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 Gillermina Phy, but prev. saw Dr. Jacinto Halim   Peripheral vascular disease (HCC)    Seasonal allergies     PAST SURGICAL HISTORY: Past Surgical History:  Procedure Laterality Date   ABDOMINAL AORTOGRAM W/LOWER EXTREMITY N/A 05/30/2022   Procedure: ABDOMINAL AORTOGRAM W/LOWER EXTREMITY;  Surgeon: Yates Decamp, MD;  Location: [facility];  Service:  Cardiovascular;  Laterality: N/A;   APPLICATION OF WOUND VAC Left 07/05/2022   Procedure: APPLICATION OF WOUND VAC;  Surgeon: Nada Libman, MD;  Location: [facility] OR;  Service: Vascular;  Laterality: Left;   APPLICATION OF WOUND VAC Left 07/05/2022   Procedure: APPLICATION OF WOUND VAC;  Surgeon: Nada Libman, MD;  Location: [facility] OR;  Service: Vascular;  Laterality: Left;   BACK SURGERY  07/31/1992   cerv. spine fusion    BREAST ENHANCEMENT SURGERY     BREAST SURGERY  04/01/1979   implants- bilateral    CATARACT EXTRACTION Left 12/2022   CERVICAL SPINE SURGERY  07/31/1990   fusion   ENDARTERECTOMY FEMORAL Left 06/02/2022   Procedure: LEFT ENDARTERECTOMY FEMORAL with BOVINE PATCH;  Surgeon: Nada Libman, MD;  Location: [facility] OR;  Service: Vascular;  Laterality: Left;   GROIN DEBRIDEMENT Left 07/05/2022  Procedure: LEFT Marden Noble WITH STIMULAN ANTIBIOTIC BEAD AND KERECIS FISH SKIN GRAFT APPLICATION;  Surgeon: Nada Libman, MD;  Location: [facility] OR;  Service: Vascular;  Laterality: Left;   INCISION AND DRAINAGE Left 07/05/2022   Procedure: LEFT GROIN WASHOUT;  Surgeon: Nada Libman, MD;  Location[facility] OR;  Service: Vascular;  Laterality: Left;   JOINT REPLACEMENT Right 07/31/2005   LOOP RECORDER INSERTION N/A 08/15/2023   Procedure: LOOP RECORDER INSERTION;  Surgeon: Graciella Freer, PA-C;  Location: [facility];  Service: Cardiovascular;  Laterality: N/A;   LOWER EXTREMITY ANGIOGRAPHY N/A 01/26/2023   Procedure: Lower Extremity Angiography;  Surgeon: Yates Decamp, MD;  Location[facility] CV [facility];  Service: Cardiovascular;  Laterality: N/A;   LUMBAR LAMINECTOMY/DECOMPRESSION MICRODISCECTOMY Left 10/07/2015   Procedure: Lumbar three-five Decompressive lumbar Laminectomy & Left Lumbar four-five Microdiscectomy;  Surgeon: Shirlean Kelly, MD;  Location: [facility];  Service: Neurosurgery;  Laterality: Left;   LUMBAR SPINE SURGERY  2016   PATCH ANGIOPLASTY Left 07/05/2022    Procedure: VEIN PATCH ANGIOPLASTY;  Surgeon: Nada Libman, MD;  Location: [facility] OR;  Service: Vascular;  Laterality: Left;   TONSILLECTOMY     TOTAL HIP ARTHROPLASTY  07/31/2005   Right   TUBAL LIGATION      FAMILY HISTORY: Family History  Problem Relation Age of Onset   Kidney cancer Mother    Lung cancer Sister    Heart disease Maternal Aunt    Heart disease Maternal Aunt    Kidney cancer Maternal Aunt    Kidney cancer Maternal Uncle     SOCIAL HISTORY: Social History   Socioeconomic History   Marital status: Married    Spouse name: now divorced   Number of children: Y   Years of education: Not on file   Highest education level: Not on file  Occupational History   Occupation: Media planner    Comment: retired  Tobacco Use   Smoking status: Former    Current packs/day: 0.00    Average packs/day: 0.7 packs/day for 24.0 years (16.8 ttl pk-yrs)    Types: Cigarettes    Start date: 07/31/1985    Quit date: 07/31/2009    Years since quitting: 14.1   Smokeless tobacco: Former    Quit date: 10/01/1998  Vaping Use   Vaping status: Never Used  Substance and Sexual Activity   Alcohol use: Yes    Comment: wine- rarely   Drug use: No   Sexual activity: Not on file  Other Topics Concern   Not on file  Social History Narrative   ** Merged History Encounter **       Pt is divorced and now lives with [employer].    Social Drivers of Corporate investment banker Strain: Not on file  Food Insecurity: No Food Insecurity (08/15/2023)   Hunger Vital Sign    Worried About Running Out of Food in the Last Year: Never true    Ran Out of Food in the Last Year: Never true  Transportation Needs: No Transportation Needs (08/15/2023)   PRAPARE - Administrator, Civil Service (Medical): No    Lack of Transportation (Non-Medical): No  Physical Activity: Not on file  Stress: Not on file  Social Connections: Socially Isolated (08/15/2023)   Social Connection and Isolation  Panel [NHANES]    Frequency of Communication with Friends and Family: Never    Frequency of Social Gatherings with Friends and Family: Never    Attends  Religious Services: Never    Active Member of Clubs or Organizations: No    Attends Banker Meetings: Never    Marital Status: Never married  Intimate Partner Violence: Not At Risk (08/15/2023)   Humiliation, Afraid, Rape, and Kick questionnaire    Fear of Current or Ex-Partner: No    Emotionally Abused: No    Physically Abused: No    Sexually Abused: No   PHYSICAL EXAM  Vitals:   09/18/23 1031  BP: 105/60  Pulse: 73  Weight: 151 lb (68.5 kg)  Height: 5\' 6"  (1.676 m)   Body mass index is 24.37 kg/m.  Generalized: Well developed, in no acute distress, appears tired, coughing, congested cough Neurological examination  Mentation: Alert oriented to time, place, history taking. Follows all commands speech and language fluent Cranial nerve II-XII: Pupils were equal round reactive to light. Extraocular movements were full, visual field were full on confrontational test. Facial sensation and strength were normal. . Head turning and shoulder shrug  were normal and symmetric. Motor: The motor testing reveals 5 over 5 strength of all 4 extremities. Good symmetric motor tone is noted throughout.  Sensory: Sensory testing is intact to soft touch on all 4 extremities. No evidence of extinction is noted.  Coordination: Cerebellar testing reveals good finger-nose-finger and heel-to-shin bilaterally.  Gait and station: Gait is normal, little bit wide-based.  Slightly unsteady upon initial standing. Reflexes: Deep tendon reflexes are symmetric and normal bilaterally.   DIAGNOSTIC DATA (LABS, IMAGING, TESTING) - I reviewed patient records, labs, notes, testing and imaging myself where available.  Lab Results  Component Value Date   WBC 6.6 08/15/2023   HGB 10.9 (L) 08/15/2023   HCT 33.5 (L) 08/15/2023   MCV 93.8 08/15/2023   PLT  186 08/15/2023      Component Value Date/Time   NA 141 08/15/2023 0546   NA 139 01/16/2023 1001   K 4.7 08/15/2023 0546   CL 109 08/15/2023 0546   CO2 24 08/15/2023 0546   GLUCOSE 100 (H) 08/15/2023 0546   BUN 39 (H) 08/15/2023 0546   BUN 20 01/16/2023 1001   CREATININE 1.63 (H) 08/15/2023 0551   CALCIUM 8.8 (L) 08/15/2023 0546   PROT 6.7 08/14/2023 1216   PROT 5.8 (L) 05/19/2022 0959   ALBUMIN 4.1 08/14/2023 1216   ALBUMIN 3.8 05/19/2022 0959   AST 19 08/14/2023 1216   ALT 21 08/14/2023 1216   ALKPHOS 40 08/14/2023 1216   BILITOT 0.5 08/14/2023 1216   BILITOT 0.3 05/19/2022 0959   GFRNONAA 32 (L) 08/15/2023 0551   GFRAA >60 10/01/2015 1029   Lab Results  Component Value Date   CHOL 143 08/15/2023   HDL 43 08/15/2023   LDLCALC 43 08/15/2023   TRIG 283 (H) 08/15/2023   CHOLHDL 3.3 08/15/2023   Lab Results  Component Value Date   HGBA1C 5.9 (H) 08/15/2023   No results found for: "VITAMINB12" Lab Results  Component Value Date   TSH 0.51 10/26/2020    Margie Ege, AGNP-C, DNP 09/18/2023, 10:55 AM [facility] [address] [place], [place] [address] [phone]

## 2023-09-18 ENCOUNTER — Encounter: Payer: Self-pay | Admitting: Neurology

## 2023-09-18 ENCOUNTER — Ambulatory Visit (INDEPENDENT_AMBULATORY_CARE_PROVIDER_SITE_OTHER): Payer: Medicare Other | Admitting: Neurology

## 2023-09-18 VITALS — BP 105/60 | HR 73 | Ht 66.0 in | Wt 151.0 lb

## 2023-09-18 DIAGNOSIS — G459 Transient cerebral ischemic attack, unspecified: Secondary | ICD-10-CM

## 2023-09-18 DIAGNOSIS — Z95818 Presence of other cardiac implants and grafts: Secondary | ICD-10-CM

## 2023-09-18 MED ORDER — CLOPIDOGREL BISULFATE 75 MG PO TABS
75.0000 mg | ORAL_TABLET | Freq: Every day | ORAL | 0 refills | Status: DC
Start: 2023-09-18 — End: 2023-12-13

## 2023-09-18 NOTE — Patient Instructions (Addendum)
Recommend seeing primary care or urgent care today for cough, respiratory symptoms. Low BP readings. Continue Plavix for secondary stroke prevention. Strict management of vascular risk factors with a goal BP less than 130/90, A1c less than 7.0, LDL less than 70 for secondary stroke prevention. Follow up with me in 6 months.

## 2023-09-21 DIAGNOSIS — J441 Chronic obstructive pulmonary disease with (acute) exacerbation: Secondary | ICD-10-CM | POA: Diagnosis not present

## 2023-09-21 DIAGNOSIS — R0602 Shortness of breath: Secondary | ICD-10-CM | POA: Diagnosis not present

## 2023-09-21 DIAGNOSIS — R0989 Other specified symptoms and signs involving the circulatory and respiratory systems: Secondary | ICD-10-CM | POA: Diagnosis not present

## 2023-09-21 DIAGNOSIS — R051 Acute cough: Secondary | ICD-10-CM | POA: Diagnosis not present

## 2023-09-25 ENCOUNTER — Ambulatory Visit (INDEPENDENT_AMBULATORY_CARE_PROVIDER_SITE_OTHER): Payer: Medicare Other

## 2023-09-25 DIAGNOSIS — Z9862 Peripheral vascular angioplasty status: Secondary | ICD-10-CM | POA: Diagnosis not present

## 2023-09-26 LAB — CUP PACEART REMOTE DEVICE CHECK
Date Time Interrogation Session: 20250225163916
Implantable Pulse Generator Implant Date: 20250115

## 2023-10-01 DIAGNOSIS — M0579 Rheumatoid arthritis with rheumatoid factor of multiple sites without organ or systems involvement: Secondary | ICD-10-CM | POA: Diagnosis not present

## 2023-10-09 DIAGNOSIS — R229 Localized swelling, mass and lump, unspecified: Secondary | ICD-10-CM | POA: Diagnosis not present

## 2023-10-11 DIAGNOSIS — G5601 Carpal tunnel syndrome, right upper limb: Secondary | ICD-10-CM | POA: Diagnosis not present

## 2023-10-23 DIAGNOSIS — G5601 Carpal tunnel syndrome, right upper limb: Secondary | ICD-10-CM | POA: Diagnosis not present

## 2023-10-29 DIAGNOSIS — M0579 Rheumatoid arthritis with rheumatoid factor of multiple sites without organ or systems involvement: Secondary | ICD-10-CM | POA: Diagnosis not present

## 2023-10-30 ENCOUNTER — Ambulatory Visit (INDEPENDENT_AMBULATORY_CARE_PROVIDER_SITE_OTHER): Payer: Medicare Other

## 2023-10-30 DIAGNOSIS — I739 Peripheral vascular disease, unspecified: Secondary | ICD-10-CM | POA: Diagnosis not present

## 2023-10-31 LAB — CUP PACEART REMOTE DEVICE CHECK
Date Time Interrogation Session: 20250401163910
Implantable Pulse Generator Implant Date: 20250115

## 2023-11-01 NOTE — Progress Notes (Signed)
 [facility] Summary Report / Loop Recorder

## 2023-11-01 NOTE — Addendum Note (Signed)
 Addended by: Elease Etienne A on: 11/01/2023 11:08 AM   Modules accepted: Orders

## 2023-11-26 DIAGNOSIS — M810 Age-related osteoporosis without current pathological fracture: Secondary | ICD-10-CM | POA: Diagnosis not present

## 2023-11-29 DIAGNOSIS — G5602 Carpal tunnel syndrome, left upper limb: Secondary | ICD-10-CM | POA: Diagnosis not present

## 2023-12-04 ENCOUNTER — Ambulatory Visit (INDEPENDENT_AMBULATORY_CARE_PROVIDER_SITE_OTHER): Payer: Medicare Other

## 2023-12-04 DIAGNOSIS — G5602 Carpal tunnel syndrome, left upper limb: Secondary | ICD-10-CM | POA: Diagnosis not present

## 2023-12-04 DIAGNOSIS — Z4789 Encounter for other orthopedic aftercare: Secondary | ICD-10-CM | POA: Diagnosis not present

## 2023-12-04 DIAGNOSIS — I6523 Occlusion and stenosis of bilateral carotid arteries: Secondary | ICD-10-CM | POA: Diagnosis not present

## 2023-12-05 LAB — CUP PACEART REMOTE DEVICE CHECK
Date Time Interrogation Session: 20250506163916
Implantable Pulse Generator Implant Date: 20250115

## 2023-12-12 DIAGNOSIS — R2242 Localized swelling, mass and lump, left lower limb: Secondary | ICD-10-CM | POA: Diagnosis not present

## 2023-12-12 DIAGNOSIS — I824Y2 Acute embolism and thrombosis of unspecified deep veins of left proximal lower extremity: Secondary | ICD-10-CM | POA: Diagnosis not present

## 2023-12-12 DIAGNOSIS — R229 Localized swelling, mass and lump, unspecified: Secondary | ICD-10-CM | POA: Diagnosis not present

## 2023-12-13 ENCOUNTER — Other Ambulatory Visit: Payer: Self-pay | Admitting: Neurology

## 2023-12-13 DIAGNOSIS — M0579 Rheumatoid arthritis with rheumatoid factor of multiple sites without organ or systems involvement: Secondary | ICD-10-CM | POA: Diagnosis not present

## 2023-12-13 DIAGNOSIS — M79643 Pain in unspecified hand: Secondary | ICD-10-CM | POA: Diagnosis not present

## 2023-12-13 DIAGNOSIS — Z79899 Other long term (current) drug therapy: Secondary | ICD-10-CM | POA: Diagnosis not present

## 2023-12-13 DIAGNOSIS — N1832 Chronic kidney disease, stage 3b: Secondary | ICD-10-CM | POA: Diagnosis not present

## 2023-12-13 DIAGNOSIS — M1990 Unspecified osteoarthritis, unspecified site: Secondary | ICD-10-CM | POA: Diagnosis not present

## 2023-12-13 DIAGNOSIS — M797 Fibromyalgia: Secondary | ICD-10-CM | POA: Diagnosis not present

## 2023-12-13 DIAGNOSIS — R768 Other specified abnormal immunological findings in serum: Secondary | ICD-10-CM | POA: Diagnosis not present

## 2023-12-13 DIAGNOSIS — M25562 Pain in left knee: Secondary | ICD-10-CM | POA: Diagnosis not present

## 2023-12-13 NOTE — Addendum Note (Signed)
 Addended by: Lott Rouleau A on: 12/13/2023 09:40 AM   Modules accepted: Orders

## 2023-12-13 NOTE — Progress Notes (Signed)
 [facility] Summary Report / Loop Recorder

## 2023-12-25 DIAGNOSIS — M0579 Rheumatoid arthritis with rheumatoid factor of multiple sites without organ or systems involvement: Secondary | ICD-10-CM | POA: Diagnosis not present

## 2024-01-01 ENCOUNTER — Other Ambulatory Visit: Payer: Self-pay | Admitting: *Deleted

## 2024-01-01 DIAGNOSIS — I739 Peripheral vascular disease, unspecified: Secondary | ICD-10-CM

## 2024-01-07 LAB — CUP PACEART REMOTE DEVICE CHECK
Date Time Interrogation Session: 20250606165654
Implantable Pulse Generator Implant Date: 20250115

## 2024-01-08 ENCOUNTER — Ambulatory Visit (INDEPENDENT_AMBULATORY_CARE_PROVIDER_SITE_OTHER): Payer: Medicare Other

## 2024-01-08 DIAGNOSIS — I6523 Occlusion and stenosis of bilateral carotid arteries: Secondary | ICD-10-CM | POA: Diagnosis not present

## 2024-01-14 ENCOUNTER — Ambulatory Visit: Payer: Self-pay | Admitting: Cardiology

## 2024-01-14 DIAGNOSIS — Z79899 Other long term (current) drug therapy: Secondary | ICD-10-CM | POA: Diagnosis not present

## 2024-01-14 DIAGNOSIS — H524 Presbyopia: Secondary | ICD-10-CM | POA: Diagnosis not present

## 2024-01-14 DIAGNOSIS — H35372 Puckering of macula, left eye: Secondary | ICD-10-CM | POA: Diagnosis not present

## 2024-01-14 DIAGNOSIS — H2511 Age-related nuclear cataract, right eye: Secondary | ICD-10-CM | POA: Diagnosis not present

## 2024-01-16 NOTE — Progress Notes (Signed)
 [facility] Summary Report / Loop Recorder

## 2024-01-16 NOTE — Addendum Note (Signed)
 Addended by: Lott Rouleau A on: 01/16/2024 10:56 AM   Modules accepted: Orders

## 2024-01-21 ENCOUNTER — Ambulatory Visit (INDEPENDENT_AMBULATORY_CARE_PROVIDER_SITE_OTHER): Admitting: Physician Assistant

## 2024-01-21 ENCOUNTER — Ambulatory Visit (HOSPITAL_COMMUNITY)
Admission: RE | Admit: 2024-01-21 | Discharge: 2024-01-21 | Disposition: A | Discharge status: Home / Self Care | Source: Ambulatory Visit | Attending: Surgery | Admitting: Surgery

## 2024-01-21 VITALS — BP 120/79 | HR 68 | Temp 97.9°F | Ht 66.0 in | Wt 156.8 lb

## 2024-01-21 DIAGNOSIS — I739 Peripheral vascular disease, unspecified: Secondary | ICD-10-CM

## 2024-01-21 DIAGNOSIS — Z0189 Encounter for other specified special examinations: Secondary | ICD-10-CM | POA: Insufficient documentation

## 2024-01-21 DIAGNOSIS — M7989 Other specified soft tissue disorders: Secondary | ICD-10-CM

## 2024-01-21 NOTE — Progress Notes (Signed)
 [facility]           [facility] [place]  History and Physical   Donna [name] is a 78 y.o. female who presents with concerns for blood clot in left proximal thigh.   She has hx of left femoral endarterectomy with bovine patch placement on 06/02/2022 by Dr. Serene for left leg claudication.   Post operatively, she developed a seroma in the left groin.  At her visit with Dr. Serene on 06/26/2022, the seroma was opened slightly with a CTA and drained several hundred cc's of clear fluid.  She was given a week of Keflex .  She was seen back on 07/03/2022 and she was continuing to have serous fluid from the the left groin that was saturating her dressing about every hour.   She underwent I&D of left groin seroma with Kerecis xenograft placement and abx beads and wound vac placement on 07/05/2022 by Dr. Serene.    Later that day, she had bleeding from the left groin and was taken back to the OR emergently for removal of left femoral bovine pericardial patch and replacement with GSV patch angioplasty, placement of Kerecis xenograft and abx beads with vac placement also by Dr. Serene.     Her intraoperative cultures grew pseudomonas and she had been on IV Cipro  and was sent home on po Cipro  for 10 days.   she was seen back on 08/28/2022 and she had completely healed.  She was going to see Dr. Ladona for the right leg later in the year and on 01/26/2023 she underwent angioplasty of the right SFA.     She has hx of loop recorder implantation for hx of cryptogenic stroke.   She states that she noticed a knot on the upper left thigh that was a little sore.  She had an u/s at her primary doctor and was told she had a blood clot and needed to follow up with a vascular surgeon.  She is here today for u/s and evaluation.   They are planning a trip to [place]  in a couple of weeks to visit their daughter.   The pt is on a statin for cholesterol management.  The pt is not on a daily aspirin .    Other AC:  Plavix  The pt is on CCB, BB, ARB, diuretic for hypertension.   The pt is not on medication for diabetes.   Tobacco hx:  former   Past Medical History:  Diagnosis Date   Arthritis    lumbar, toes, L knee, cerv. spine & hands  - rheumatoid   Cancer (HCC)    basal cell removed fr. 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 WENDI Clara, but prev. saw Dr. Ladona   Peripheral vascular disease [facility])    Seasonal allergies     Past Surgical History:  Procedure Laterality Date   ABDOMINAL AORTOGRAM W/LOWER EXTREMITY N/A 05/30/2022   Procedure: ABDOMINAL AORTOGRAM W/LOWER EXTREMITY;  Surgeon: [name], MD;  Location: [facility] IN[facility];  Service: Cardiovascular;  Laterality: N/A;   APPLICATION OF WOUND VAC Left 07/05/2022   Procedure: APPLICATION OF WOUND VAC;  Surgeon: Serene Gaile ORN, MD;  Location: [facility] OR;  Service: Vascular;  Laterality: Left;   APPLICATION OF WOUND VAC Left 07/05/2022   Procedure: APPLICATION OF WOUND VAC;  Surgeon: Serene Gaile ORN, MD;  Location: [facility] OR;  Service: Vascular;  Laterality: Left;   BACK SURGERY  07/31/1992   cerv. spine fusion    BREAST ENHANCEMENT SURGERY     BREAST SURGERY  04/01/1979   implants- bilateral    CATARACT EXTRACTION Left 12/2022   CERVICAL SPINE SURGERY  07/31/1990   fusion   ENDARTERECTOMY FEMORAL Left 06/02/2022   Procedure: LEFT ENDARTERECTOMY FEMORAL with BOVINE PATCH;  Surgeon: Serene Gaile ORN, MD;  Location: [facility] OR;  Service: Vascular;  Laterality: Left;   GROIN DEBRIDEMENT Left 07/05/2022   Procedure: LEFT GROIN WASHOUT WITH STIMULAN ANTIBIOTIC  BEAD AND KERECIS FISH SKIN GRAFT APPLICATION;  Surgeon: Serene Gaile ORN, MD;  Location: [facility] OR;  Service: Vascular;  Laterality: Left;   INCISION AND DRAINAGE Left 07/05/2022   Procedure: LEFT GROIN WASHOUT;  Surgeon: Serene Gaile ORN, MD;  Location: [facility] OR;  Service: Vascular;  Laterality: Left;   JOINT REPLACEMENT Right 07/31/2005   LOOP RECORDER INSERTION N/A 08/15/2023   Procedure: LOOP RECORDER INSERTION;  Surgeon: Lesia Ozell Barter, PA-C;  Location: [facility];  Service: Cardiovascular;  Laterality: N/A;   LOWER EXTREMITY ANGIOGRAPHY N/A 01/26/2023   Procedure: Lower Extremity Angiography;  Surgeon: [name], MD;  Location: [facility] INVASIVE CV LAB;  Service: Cardiovascular;  Laterality: N/A;   LUMBAR LAMINECTOMY/DECOMPRESSION MICRODISCECTOMY Left 10/07/2015   Procedure: Lumbar three-five Decompressive lumbar Laminectomy & Left Lumbar four-five Microdiscectomy;  Surgeon: Lamar Peaches, MD;  Location: [facility]URO [facility];  Service: Neurosurgery;  Laterality: Left;   LUMBAR SPINE SURGERY  2016   PATCH ANGIOPLASTY Left 07/05/2022   Procedure: VEIN PATCH ANGIOPLASTY;  Surgeon: Serene Gaile ORN, MD;  Location: [facility] OR;  Service: Vascular;  Laterality: Left;   TONSILLECTOMY     TOTAL HIP ARTHROPLASTY  07/31/2005   Right   TUBAL LIGATION      Social History   Socioeconomic History   Marital status: Married    Spouse name: now divorced   Number of children: Y   Years of education: Not on file   Highest education level: Not on file  Occupational History   Occupation: Media planner    Comment: retired  Tobacco Use   Smoking status: Former    Current packs/day: 0.00    Average packs/day: 0.7 packs/day for 24.0 years (16.8 ttl pk-yrs)    Types: Cigarettes    Start date: 07/31/1985    Quit date: 07/31/2009    Years since quitting: 14.4   Smokeless tobacco: Former    Quit date: 10/01/1998  Vaping Use   Vaping status: Never Used  Substance and Sexual Activity   Alcohol use: Yes     Comment: wine- rarely   Drug use: No   Sexual activity: Not on file  Other Topics Concern   Not on file  Social History Narrative   ** Merged History Encounter **       Pt is divorced and now lives with [employer].    Social Drivers of Corporate investment banker Strain: Not on file  Food Insecurity: No Food Insecurity (08/15/2023)   Hunger Vital Sign    Worried About  Running Out of Food in the Last Year: Never true    Ran Out of Food in the Last Year: Never true  Transportation Needs: No Transportation Needs (08/15/2023)   PRAPARE - Administrator, Civil Service (Medical): No    Lack of Transportation (Non-Medical): No  Physical Activity: Not on file  Stress: Not on file  Social Connections: Socially Isolated (08/15/2023)   Social Connection and Isolation Panel    Frequency of Communication with Friends and Family: Never    Frequency of Social Gatherings with Friends and Family: Never    Attends Religious Services: Never    Database administrator or Organizations: No    Attends Banker Meetings: Never    Marital Status: Never married  Intimate Partner Violence: Not At Risk (08/15/2023)   Humiliation, Afraid, Rape, and Kick questionnaire    Fear of Current or Ex-Partner: No    Emotionally Abused: No    Physically Abused: No    Sexually Abused: No     Family History  Problem Relation Age of Onset   Kidney cancer Mother    Lung cancer Sister    Heart disease Maternal Aunt    Heart disease Maternal Aunt    Kidney cancer Maternal Aunt    Kidney cancer Maternal Uncle     Current Outpatient Medications  Medication Sig Dispense Refill   acetaminophen  (TYLENOL ) 325 MG tablet Take 650 mg by mouth daily as needed for moderate pain (pain score 4-6) or mild pain (pain score 1-3).     albuterol  (PROVENTIL  HFA;VENTOLIN  HFA) 108 (90 Base) MCG/ACT inhaler Inhale 2 puffs into the lungs every 4 (four) hours as needed for wheezing or shortness of breath.      amLODipine  (NORVASC ) 10 MG tablet Take 1 tablet (10 mg total) by mouth daily. 90 tablet 3   bisoprolol  (ZEBETA ) 5 MG tablet TAKE 1 TABLET BY MOUTH DAILY (Patient taking differently: Take 10 mg by mouth daily. TAKE 1 TABLET BY MOUTH DAILY) 90 tablet 3   BREZTRI  AEROSPHERE 160-9-4.8 MCG/ACT AERO Inhale 2 puffs into the lungs in the morning and at bedtime.     buPROPion  (WELLBUTRIN  XL) 300 MG 24 hr tablet Take 300 mg by mouth daily.     certolizumab pegol  (CIMZIA ) 2 X 200 MG KIT Inject into the skin every 30 (thirty) days.     chlorthalidone (HYGROTON) 25 MG tablet Take 25 mg by mouth daily.     clopidogrel  (PLAVIX ) 75 MG tablet Take 1 tablet by mouth once daily 90 tablet 0   DULoxetine  (CYMBALTA ) 20 MG capsule Take 20 mg by mouth daily.     famotidine  (PEPCID ) 20 MG tablet Take 20 mg by mouth daily. (Patient not taking: Reported on 09/18/2023)     ipratropium (ATROVENT ) 0.06 % nasal spray Place 1 spray into both nostrils in the morning. (Patient taking differently: Place 2-3 sprays into both nostrils in the morning. Running nose) 15 mL 11   leflunomide  (ARAVA ) 20 MG tablet Take 20 mg by mouth in the morning.     Multiple Vitamins-Minerals (MULTIVITAMIN WOMEN) TABS Take 2 tablets by mouth in the morning.     olmesartan-hydrochlorothiazide  (BENICAR HCT) 40-25 MG tablet Take 1 tablet by mouth daily.     pantoprazole  (PROTONIX ) 40 MG tablet Take 40 mg by mouth daily. (Patient not taking: Reported on 09/18/2023)     rosuvastatin  (CRESTOR ) 5 MG tablet Take 5 mg by mouth in the morning.     No  current facility-administered medications for this visit.    Allergies  Allergen Reactions   Thorazine [Chlorpromazine] Anaphylaxis    REVIEW OF SYSTEMS:   [X]  denotes positive finding, [ ]  denotes negative finding Cardiac  Comments:  Chest pain or chest pressure:    Shortness of breath upon exertion:    Short of breath when lying flat:    Irregular heart rhythm:        Vascular    Pain in calf, thigh,  or hip brought on by ambulation:    Pain in feet at night that wakes you up from your sleep:     Blood clot in your veins: ? See HPI  Leg swelling:  x       Pulmonary    Oxygen  at home:    Productive cough:     Wheezing:         Neurologic    Sudden weakness in arms or legs:     Sudden numbness in arms or legs:     Sudden onset of difficulty speaking or slurred speech:    Temporary loss of vision in one eye:     Problems with dizziness:         Gastrointestinal    Blood in stool:     Vomited blood:         Genitourinary    Burning when urinating:     Blood in urine:        Psychiatric    Major depression:         Hematologic    Bleeding problems:    Problems with blood clotting too easily:        Skin    Rashes or ulcers:        Constitutional    Fever or chills:      PHYSICAL EXAMINATION:  Today's Vitals   01/21/24 1012  BP: 120/79  Pulse: 68  Temp: 97.9 F (36.6 C)  TempSrc: Temporal  SpO2: 92%  Weight: 156 lb 12.8 oz (71.1 kg)  Height: 5' 6 (1.676 m)  PainSc: 0-No pain   Body mass index is 25.31 kg/m.   General:  WDWN in NAD; vital signs documented above Gait: Not observed HENT: WNL, normocephalic Pulmonary: normal non-labored breathing without wheezing Cardiac: regular HR; without carotid bruits Skin: without rashes; well healed left groin and proximal thigh incisions. Vascular Exam/Pulses:  Right Left  Radial 2+ (normal) 2+ (normal)  DP 2+ (normal) brisk doppler flow Brisk doppler flow monophasic  PT Brisk doppler flow monophasic Brisk doppler flow monophasic   Extremities: no non healing wounds. No swelling present.  Small palpable mass right at saphenectomy incision mildly tender to palpation Neurologic: A&O X 3;  moving all extremities equally Psychiatric:  The pt has Normal affect.   Non-Invasive Vascular Imaging:   Venous duplex on 01/21/2024: - There is no evidence of deep vein thrombosis in the lower extremity.  - There is no  evidence of deep vein thrombosis proximal to the inguinal ligament or in the common femoral vein.  - Left Greater saphenous vein harvested for endarterectomy.  - 1.46 x 0.77 structure with no flow in the proximal to mid thigh with echoes noted within.  - No pseudoaneurysm visualized.  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 female who presents with: concerns for DVT/blood clot     -pt has palpable left DP pedal pulse and brisk doppler flow bilateral DP/PT -pt does  have evidence of DVT.  She does a 1.46cm x 0.77cm in the proximal thigh at her saphenectomy site.  Most likely where the saphenous vein was ligated for her vein patch angioplasty.   -discussed with pt about wearing knee high 15-20 mmHg compression stockings when she flies to [place]  and pt was measured for these today.   Discussed getting up about every hour on the plan to walk as well.   -Dr. Ladona follows pt PAD.  She is on asa/statin.   pt will f/u with [facility] as needed.    Lucie Apt, [facility] [phone]  Clinic MD:  Serene

## 2024-01-22 DIAGNOSIS — M0579 Rheumatoid arthritis with rheumatoid factor of multiple sites without organ or systems involvement: Secondary | ICD-10-CM | POA: Diagnosis not present

## 2024-02-06 ENCOUNTER — Other Ambulatory Visit: Payer: Self-pay | Admitting: Internal Medicine

## 2024-02-06 DIAGNOSIS — R911 Solitary pulmonary nodule: Secondary | ICD-10-CM

## 2024-02-08 ENCOUNTER — Ambulatory Visit

## 2024-02-08 DIAGNOSIS — I6523 Occlusion and stenosis of bilateral carotid arteries: Secondary | ICD-10-CM

## 2024-02-08 LAB — CUP PACEART REMOTE DEVICE CHECK
Date Time Interrogation Session: 20250710234519
Implantable Pulse Generator Implant Date: 20250115

## 2024-02-11 ENCOUNTER — Ambulatory Visit: Payer: Self-pay | Admitting: Cardiology

## 2024-02-11 ENCOUNTER — Other Ambulatory Visit: Payer: Self-pay | Admitting: Internal Medicine

## 2024-02-27 ENCOUNTER — Ambulatory Visit
Admission: RE | Admit: 2024-02-27 | Discharge: 2024-02-27 | Disposition: A | Discharge status: Home / Self Care | Source: Ambulatory Visit | Attending: Internal Medicine | Admitting: Internal Medicine

## 2024-02-27 DIAGNOSIS — R053 Chronic cough: Secondary | ICD-10-CM | POA: Diagnosis not present

## 2024-02-27 DIAGNOSIS — R911 Solitary pulmonary nodule: Secondary | ICD-10-CM

## 2024-02-27 DIAGNOSIS — J449 Chronic obstructive pulmonary disease, unspecified: Secondary | ICD-10-CM | POA: Diagnosis not present

## 2024-03-06 ENCOUNTER — Ambulatory Visit: Payer: Self-pay | Admitting: Internal Medicine

## 2024-03-10 ENCOUNTER — Ambulatory Visit: Payer: Self-pay | Admitting: Cardiology

## 2024-03-10 ENCOUNTER — Ambulatory Visit (INDEPENDENT_AMBULATORY_CARE_PROVIDER_SITE_OTHER)

## 2024-03-10 DIAGNOSIS — I739 Peripheral vascular disease, unspecified: Secondary | ICD-10-CM | POA: Diagnosis not present

## 2024-03-10 LAB — CUP PACEART REMOTE DEVICE CHECK
Date Time Interrogation Session: 20250810234626
Implantable Pulse Generator Implant Date: 20250115

## 2024-03-11 ENCOUNTER — Other Ambulatory Visit: Payer: Self-pay | Admitting: Internal Medicine

## 2024-03-11 NOTE — Progress Notes (Signed)
 [facility] Summary Report / Loop Recorder

## 2024-03-13 DIAGNOSIS — R768 Other specified abnormal immunological findings in serum: Secondary | ICD-10-CM | POA: Diagnosis not present

## 2024-03-13 DIAGNOSIS — M0579 Rheumatoid arthritis with rheumatoid factor of multiple sites without organ or systems involvement: Secondary | ICD-10-CM | POA: Diagnosis not present

## 2024-03-13 DIAGNOSIS — M1990 Unspecified osteoarthritis, unspecified site: Secondary | ICD-10-CM | POA: Diagnosis not present

## 2024-03-13 DIAGNOSIS — N1832 Chronic kidney disease, stage 3b: Secondary | ICD-10-CM | POA: Diagnosis not present

## 2024-03-13 DIAGNOSIS — M797 Fibromyalgia: Secondary | ICD-10-CM | POA: Diagnosis not present

## 2024-03-13 DIAGNOSIS — Z79899 Other long term (current) drug therapy: Secondary | ICD-10-CM | POA: Diagnosis not present

## 2024-03-13 DIAGNOSIS — M79643 Pain in unspecified hand: Secondary | ICD-10-CM | POA: Diagnosis not present

## 2024-03-13 DIAGNOSIS — M25562 Pain in left knee: Secondary | ICD-10-CM | POA: Diagnosis not present

## 2024-03-14 ENCOUNTER — Other Ambulatory Visit: Payer: Self-pay | Admitting: Internal Medicine

## 2024-03-18 DIAGNOSIS — M0579 Rheumatoid arthritis with rheumatoid factor of multiple sites without organ or systems involvement: Secondary | ICD-10-CM | POA: Diagnosis not present

## 2024-03-24 ENCOUNTER — Telehealth: Payer: Self-pay | Admitting: Neurology

## 2024-03-24 NOTE — Telephone Encounter (Signed)
 Patient schedule appointment due to being out of town. Will call back to reschedule.

## 2024-03-25 ENCOUNTER — Ambulatory Visit: Payer: Medicare Other | Admitting: Neurology

## 2024-03-27 ENCOUNTER — Other Ambulatory Visit: Payer: Self-pay | Admitting: Internal Medicine

## 2024-04-04 DIAGNOSIS — Z23 Encounter for immunization: Secondary | ICD-10-CM | POA: Diagnosis not present

## 2024-04-06 ENCOUNTER — Other Ambulatory Visit: Payer: Self-pay | Admitting: Neurology

## 2024-04-07 NOTE — Progress Notes (Unsigned)
 Patient: Donna [name] Date of Birth: 01/27/1946  Reason for Visit: Stroke Clinic Follow Up History from: Patient Primary Neurologist: Rosemarie  ASSESSMENT AND PLAN 78 y.o. year old female with left-sided TIA.  Presented with episode of aphasia and word finding.  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  Vascular risk factors: HLD, HTN.  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  Normal MRA head and neck.  -Follow-up today with primary care/urgent care about flu, cough, congestion. BP is low today, evidence of orthostatic changes today. Hold BP medications until can follow up with PCP, has been having dizziness -Continue Plavix  75 mg daily for secondary stroke prevention -Cardiology is following your loop recorder readings. I will forward over to Dr. Inocencio. Recent reading showed possible AFIB episode but to much background activity to say for sure. At time of reading, recovering from the flu -Follow-up with me in 6 months or sooner if needed  HISTORY OF PRESENT ILLNESS: Today 04/07/24 04/08/24 SS:   09/18/23 SS: Loop recorder report yesterday showed possible episode of AFIB but background artifact will continue to monitor. Has had the flu last week, still dealing with congestion, cough, PCP started Tessalon. Post admission, mentions having dizzy spells often, whether standing or sitting. Occasional headache nothing to worry her. Today BP 105/60, at home has been running 70-80's systolic. Before admission BP was high, struggling to get in a normal range.She has stopped the Norvasc , Benicar herself few days. Remains on bisoprolol , chlorthalidone. Needs refill on Plavix . No new neuro symptoms. Feels extremely tired, having to take a nap. Has severe RA, sometimes doesn't feel well from this. She drives, is social. Sees Dr. Ladona cardiology. Evidence of orthostatic changes today.   No data found.  HISTORY  Presented 08/14/2023 with transient episode of aphasia and  word finding difficulty.  Her husband drove her to the ER, symptoms resolved in 15 to 20 minutes.  MRI of the brain showed no acute stroke.  Cryptogenic stroke.  Loop recorder was placed.  -Code stroke CT head no acute abnormality.  Small vessel disease.  Atrophy. -MRI of the brain no acute abnormality.  Chronic ischemic microangiopathy -MRA head and neck no LVO or significant stenosis -2D echo EF 60 to 65%, -Loop recorder was placed 08/15/23 -LDL 43, on Crestor   -A1c 5.9 -Aspirin  81 mg daily prior to admission, DAPT aspirin  81 and Plavix  75 mg daily for 3 weeks, then Plavix  alone -EEG was normal  REVIEW OF SYSTEMS: Out of a complete 14 system review of symptoms, the patient complains only of the following symptoms, and all other reviewed systems are negative.  See HPI  ALLERGIES: Allergies  Allergen Reactions   Thorazine [Chlorpromazine] Anaphylaxis    HOME MEDICATIONS: Outpatient Medications Prior to Visit  Medication Sig Dispense Refill   acetaminophen  (TYLENOL ) 325 MG tablet Take 650 mg by mouth daily as needed for moderate pain (pain score 4-6) or mild pain (pain score 1-3).     albuterol  (PROVENTIL  HFA;VENTOLIN  HFA) 108 (90 Base) MCG/ACT inhaler Inhale 2 puffs into the lungs every 4 (four) hours as needed for wheezing or shortness of breath.     amLODipine  (NORVASC ) 10 MG tablet Take 1 tablet (10 mg total) by mouth daily. 90 tablet 3   bisoprolol  (ZEBETA ) 5 MG tablet TAKE 1 TABLET BY MOUTH DAILY (Patient taking differently: Take 10 mg by mouth daily. TAKE 1 TABLET BY MOUTH DAILY) 90 tablet 3   BREZTRI  AEROSPHERE 160-9-4.8 MCG/ACT AERO Inhale 2 puffs  into the lungs in the morning and at bedtime.     buPROPion  (WELLBUTRIN  XL) 300 MG 24 hr tablet Take 300 mg by mouth daily.     certolizumab pegol  (CIMZIA ) 2 X 200 MG KIT Inject into the skin every 30 (thirty) days. (Patient not taking: Reported on 01/21/2024)     chlorthalidone (HYGROTON) 25 MG tablet Take 25 mg by mouth daily.      clopidogrel  (PLAVIX ) 75 MG tablet Take 1 tablet by mouth once daily 90 tablet 0   DULoxetine  (CYMBALTA ) 20 MG capsule Take 20 mg by mouth daily.     famotidine  (PEPCID ) 20 MG tablet Take 20 mg by mouth daily. (Patient not taking: Reported on 01/21/2024)     ipratropium (ATROVENT ) 0.06 % nasal spray USE 1 SPRAY(S) IN EACH NOSTRIL ONCE DAILY IN THE MORNING 15 mL 0   leflunomide  (ARAVA ) 20 MG tablet Take 20 mg by mouth in the morning. (Patient not taking: Reported on 01/21/2024)     Multiple Vitamins-Minerals (MULTIVITAMIN WOMEN) TABS Take 2 tablets by mouth in the morning.     olmesartan-hydrochlorothiazide  (BENICAR HCT) 40-25 MG tablet Take 1 tablet by mouth daily.     pantoprazole  (PROTONIX ) 40 MG tablet Take 40 mg by mouth daily.     rosuvastatin  (CRESTOR ) 5 MG tablet Take 5 mg by mouth in the morning.     No facility-administered medications prior to visit.    PAST MEDICAL HISTORY: Past Medical History:  Diagnosis Date   Arthritis    lumbar, toes, L knee, cerv. spine & hands  - rheumatoid   Cancer (HCC)    basal cell removed fr. 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 WENDI Clara, but prev. saw Dr. Ladona   Peripheral vascular disease (HCC)    Seasonal allergies     PAST SURGICAL HISTORY: Past Surgical History:  Procedure Laterality Date   ABDOMINAL AORTOGRAM W/LOWER EXTREMITY N/A 05/30/2022   Procedure: ABDOMINAL AORTOGRAM W/LOWER EXTREMITY;  Surgeon: [name], MD;  Location: [facility] INVASIVE CV LAB;  Service: Cardiovascular;  Laterality: N/A;   APPLICATION OF WOUND VAC Left 07/05/2022   Procedure: APPLICATION  OF WOUND VAC;  Surgeon: Serene Gaile ORN, MD;  Location: [facility] OR;  Service: Vascular;  Laterality: Left;   APPLICATION OF WOUND VAC Left 07/05/2022   Procedure: APPLICATION OF WOUND VAC;  Surgeon: Serene Gaile ORN, MD;  Location: [facility] OR;  Service: Vascular;  Laterality: Left;   BACK SURGERY  07/31/1992   cerv. spine fusion    BREAST ENHANCEMENT SURGERY     BREAST SURGERY  04/01/1979   implants- bilateral    CATARACT EXTRACTION Left 12/2022   CERVICAL SPINE SURGERY  07/31/1990   fusion   ENDARTERECTOMY FEMORAL Left 06/02/2022   Procedure: LEFT ENDARTERECTOMY FEMORAL with BOVINE PATCH;  Surgeon: Serene Gaile ORN, MD;  Location: [facility] OR;  Service: Vascular;  Laterality: Left;   GROIN DEBRIDEMENT Left 07/05/2022   Procedure: LEFT GROIN WASHOUT WITH STIMULAN ANTIBIOTIC BEAD AND KERECIS FISH SKIN GRAFT APPLICATION;  Surgeon: Serene Gaile ORN, MD;  Location: [facility] OR;  Service: Vascular;  Laterality: Left;   INCISION AND DRAINAGE Left 07/05/2022   Procedure: LEFT GROIN WASHOUT;  Surgeon: Serene Gaile ORN, MD;  Location[facility];  Service: Vascular;  Laterality: Left;   JOINT REPLACEMENT Right 07/31/2005   LOOP RECORDER INSERTION N/A 08/15/2023   Procedure: LOOP RECORDER INSERTION;  Surgeon: Lesia Ozell Barter, PA-C;  Location: [facility] INVASIVE CV LAB;  Service: Cardiovascular;  Laterality: N/A;   LOWER EXTREMITY ANGIOGRAPHY N/A 01/26/2023   Procedure: Lower Extremity Angiography;  Surgeon: [name], MD;  Location[facility] INVASIVE CV LAB;  Service: Cardiovascular;  Laterality: N/A;   LUMBAR LAMINECTOMY/DECOMPRESSION MICRODISCECTOMY Left 10/07/2015   Procedure: Lumbar three-five Decompressive lumbar Laminectomy & Left Lumbar four-five Microdiscectomy;  Surgeon: Lamar Peaches, MD;  Location: [facility] NEURO ORS;  Service: Neurosurgery;  Laterality: Left;   LUMBAR SPINE SURGERY  2016   PATCH ANGIOPLASTY Left 07/05/2022   Procedure: VEIN PATCH ANGIOPLASTY;  Surgeon: Serene Gaile ORN, MD;  Location: [facility] OR;  Service:  Vascular;  Laterality: Left;   TONSILLECTOMY     TOTAL HIP ARTHROPLASTY  07/31/2005   Right   TUBAL LIGATION      FAMILY HISTORY: Family History  Problem Relation Age of Onset   Kidney cancer Mother    Lung cancer Sister    Heart disease Maternal Aunt    Heart disease Maternal Aunt    Kidney cancer Maternal Aunt    Kidney cancer Maternal Uncle     SOCIAL HISTORY: Social History   Socioeconomic History   Marital status: Married    Spouse name: now divorced   Number of children: Y   Years of education: Not on file   Highest education level: Not on file  Occupational History   Occupation: Media planner    Comment: retired  Tobacco Use   Smoking status: Former    Current packs/day: 0.00    Average packs/day: 0.7 packs/day for 24.0 years (16.8 ttl pk-yrs)    Types: Cigarettes    Start date: 07/31/1985    Quit date: 07/31/2009    Years since quitting: 14.6   Smokeless tobacco: Former    Quit date: 10/01/1998  Vaping Use   Vaping status: Never Used  Substance and Sexual Activity   Alcohol use: Yes    Comment: wine- rarely   Drug use: No   Sexual activity: Not on file  Other Topics Concern   Not on file  Social History Narrative   ** Merged History Encounter **       Pt is divorced and now lives with [employer].    Social Drivers of Corporate investment banker Strain: Not on file  Food Insecurity: No Food Insecurity (08/15/2023)   Hunger Vital Sign    Worried About Running Out of Food in the Last Year: Never true    Ran Out of Food in the Last Year: Never true  Transportation Needs: No Transportation Needs (08/15/2023)   PRAPARE - Administrator, Civil Service (Medical): No    Lack of Transportation (Non-Medical): No  Physical Activity: Not on file  Stress: Not on file  Social Connections: Socially Isolated (08/15/2023)   Social Connection and Isolation Panel    Frequency of Communication with Friends and Family: Never    Frequency of Social  Gatherings with Friends and Family: Never    Attends Religious Services: Never    Database administrator or Organizations: No  Attends Banker Meetings: Never    Marital Status: Never married  Intimate Partner Violence: Not At Risk (08/15/2023)   Humiliation, Afraid, Rape, and Kick questionnaire    Fear of Current or Ex-Partner: No    Emotionally Abused: No    Physically Abused: No    Sexually Abused: No   PHYSICAL EXAM  There were no vitals filed for this visit.  There is no height or weight on file to calculate BMI.  Generalized: Well developed, in no acute distress, appears tired, coughing, congested cough Neurological examination  Mentation: Alert oriented to time, place, history taking. Follows all commands speech and language fluent Cranial nerve II-XII: Pupils were equal round reactive to light. Extraocular movements were full, visual field were full on confrontational test. Facial sensation and strength were normal. . Head turning and shoulder shrug  were normal and symmetric. Motor: The motor testing reveals 5 over 5 strength of all 4 extremities. Good symmetric motor tone is noted throughout.  Sensory: Sensory testing is intact to soft touch on all 4 extremities. No evidence of extinction is noted.  Coordination: Cerebellar testing reveals good finger-nose-finger and heel-to-shin bilaterally.  Gait and station: Gait is normal, little bit wide-based.  Slightly unsteady upon initial standing. Reflexes: Deep tendon reflexes are symmetric and normal bilaterally.   DIAGNOSTIC DATA (LABS, IMAGING, TESTING) - I reviewed patient records, labs, notes, testing and imaging myself where available.  Lab Results  Component Value Date   WBC 6.6 08/15/2023   HGB 10.9 (L) 08/15/2023   HCT 33.5 (L) 08/15/2023   MCV 93.8 08/15/2023   PLT 186 08/15/2023      Component Value Date/Time   NA 141 08/15/2023 0546   NA 139 01/16/2023 1001   K 4.7 08/15/2023 0546   CL 109  08/15/2023 0546   CO2 24 08/15/2023 0546   GLUCOSE 100 (H) 08/15/2023 0546   BUN 39 (H) 08/15/2023 0546   BUN 20 01/16/2023 1001   CREATININE 1.63 (H) 08/15/2023 0551   CALCIUM  8.8 (L) 08/15/2023 0546   PROT 6.7 08/14/2023 1216   PROT 5.8 (L) 05/19/2022 0959   ALBUMIN  4.1 08/14/2023 1216   ALBUMIN  3.8 05/19/2022 0959   AST 19 08/14/2023 1216   ALT 21 08/14/2023 1216   ALKPHOS 40 08/14/2023 1216   BILITOT 0.5 08/14/2023 1216   BILITOT 0.3 05/19/2022 0959   GFRNONAA 32 (L) 08/15/2023 0551   GFRAA >60 10/01/2015 1029   Lab Results  Component Value Date   CHOL 143 08/15/2023   HDL 43 08/15/2023   LDLCALC 43 08/15/2023   TRIG 283 (H) 08/15/2023   CHOLHDL 3.3 08/15/2023   Lab Results  Component Value Date   HGBA1C 5.9 (H) 08/15/2023   No results found for: [id number] Lab Results  Component Value Date   TSH 0.51 10/26/2020    Lauraine Born, AGNP-C, DNP 04/07/2024, 9:17 PM [facility] [address] [place], [place] [address] [phone]

## 2024-04-08 ENCOUNTER — Ambulatory Visit (INDEPENDENT_AMBULATORY_CARE_PROVIDER_SITE_OTHER): Admitting: Neurology

## 2024-04-08 ENCOUNTER — Encounter: Payer: Self-pay | Admitting: Neurology

## 2024-04-08 VITALS — BP 125/74 | HR 115 | Resp 15 | Ht 66.0 in | Wt 156.0 lb

## 2024-04-08 DIAGNOSIS — G459 Transient cerebral ischemic attack, unspecified: Secondary | ICD-10-CM | POA: Diagnosis not present

## 2024-04-08 NOTE — Patient Instructions (Signed)
 Great to see you today! Continue Plavix  for secondary stroke prevention Strict management of vascular risk factors with a goal BP less than 130/90, A1c less than 7.0, LDL less than 70 for secondary stroke prevention Exercise, be active, engage in brain stimulating activities to promote brain health Keep close follow-up with primary care.  Return here as needed.  Thanks!!

## 2024-04-10 ENCOUNTER — Ambulatory Visit (INDEPENDENT_AMBULATORY_CARE_PROVIDER_SITE_OTHER)

## 2024-04-10 ENCOUNTER — Ambulatory Visit: Payer: Self-pay | Admitting: Cardiology

## 2024-04-10 DIAGNOSIS — I739 Peripheral vascular disease, unspecified: Secondary | ICD-10-CM | POA: Diagnosis not present

## 2024-04-10 LAB — CUP PACEART REMOTE DEVICE CHECK
Date Time Interrogation Session: 20250910234027
Implantable Pulse Generator Implant Date: 20250115

## 2024-04-17 NOTE — Progress Notes (Signed)
 Remote Loop Recorder Transmission

## 2024-04-22 ENCOUNTER — Other Ambulatory Visit: Payer: Self-pay | Admitting: Internal Medicine

## 2024-04-24 NOTE — Progress Notes (Signed)
 Remote Loop Recorder Transmission

## 2024-04-29 DIAGNOSIS — Z1231 Encounter for screening mammogram for malignant neoplasm of breast: Secondary | ICD-10-CM | POA: Diagnosis not present

## 2024-04-30 ENCOUNTER — Telehealth: Payer: Self-pay | Admitting: Cardiology

## 2024-04-30 NOTE — Telephone Encounter (Signed)
 Pt states her PAD symptoms have been worsening. She is having difficulty walking. Please advise.

## 2024-04-30 NOTE — Telephone Encounter (Signed)
 Spoke with Pt. Pt has PAD and states her legs hurt all the time and starting to turn purple again. Legs ache and pt states she can barely walk and can not stand over 4 minutes at a time. Pt is using a cane. Pt states this has happened before.  Takes BP everyday and normally runs 120/65 HR in the 70's. Appt set with Dr Ladona 10/3.

## 2024-05-02 ENCOUNTER — Ambulatory Visit: Attending: Cardiology | Admitting: Cardiology

## 2024-05-02 ENCOUNTER — Encounter: Payer: Self-pay | Admitting: Cardiology

## 2024-05-02 VITALS — BP 112/62 | HR 65 | Resp 16 | Ht 66.0 in | Wt 158.8 lb

## 2024-05-02 DIAGNOSIS — N1832 Chronic kidney disease, stage 3b: Secondary | ICD-10-CM | POA: Insufficient documentation

## 2024-05-02 DIAGNOSIS — I10 Essential (primary) hypertension: Secondary | ICD-10-CM | POA: Insufficient documentation

## 2024-05-02 DIAGNOSIS — I739 Peripheral vascular disease, unspecified: Secondary | ICD-10-CM | POA: Insufficient documentation

## 2024-05-02 DIAGNOSIS — Z9862 Peripheral vascular angioplasty status: Secondary | ICD-10-CM | POA: Diagnosis not present

## 2024-05-02 DIAGNOSIS — Z9889 Other specified postprocedural states: Secondary | ICD-10-CM | POA: Insufficient documentation

## 2024-05-02 NOTE — Patient Instructions (Signed)
 Medication Instructions:  Your physician recommends that you continue on your current medications as directed. Please refer to the Current Medication list given to you today.  *If you need a refill on your cardiac medications before your next appointment, please call your pharmacy*  Lab Work: None ordered.  If you have labs (blood work) drawn today and your tests are completely normal, you will receive your results only by: MyChart Message (if you have MyChart) OR A paper copy in the mail If you have any lab test that is abnormal or we need to change your treatment, we will call you to review the results.  Testing/Procedures: Your physician has requested that you have a lower or upper extremity arterial duplex. This test is an ultrasound of the arteries in the legs or arms. It looks at arterial blood flow in the legs and arms. Allow one hour for Lower and Upper Arterial scans. There are no restrictions or special instructions.  Your physician has requested that you have an ankle brachial index (ABI). During this test an ultrasound and blood pressure cuff are used to evaluate the arteries that supply the arms and legs with blood. Allow thirty minutes for this exam. There are no restrictions or special instructions.  Please note: We ask at that you not bring children with you during ultrasound (echo/ vascular) testing. Due to room size and safety concerns, children are not allowed in the ultrasound rooms during exams. Our front office staff cannot provide observation of children in our lobby area while testing is being conducted. An adult accompanying a patient to their appointment will only be allowed in the ultrasound room at the discretion of the ultrasound technician under special circumstances. We apologize for any inconvenience.   Please note: We ask at that you not bring children with you during ultrasound (echo/ vascular) testing. Due to room size and safety concerns, children are not  allowed in the ultrasound rooms during exams. Our front office staff cannot provide observation of children in our lobby area while testing is being conducted. An adult accompanying a patient to their appointment will only be allowed in the ultrasound room at the discretion of the ultrasound technician under special circumstances. We apologize for any inconvenience.   Follow-Up: At [facility], you and your health needs are our priority.  As part of our continuing mission to provide you with exceptional heart care, our providers are all part of one team.  This team includes your primary Cardiologist (physician) and Advanced Practice Providers or APPs (Physician Assistants and Nurse Practitioners) who all work together to provide you with the care you need, when you need it.  Your next appointment:   6 months with Dr Ganji

## 2024-05-02 NOTE — Progress Notes (Signed)
 Cardiology Office Note:  .   Date:  05/03/2024  ID:  Donna [name], DOB 1946-01-10, MRN [id number] PCP: Clarice Nottingham, MD  [facility] Providers Cardiologist:  None   History of Present Illness: .   Donna [name] is a 78 y.o. emale patient with hypertension, hyperlipidemia, hyperglycemia, stage IIIa chronic kidney disease, degenerative joint disease and chronic back pain, prior tobacco use disorder with approximately 20-pack-year history of smoking quit in 2000, left femoral endarterectomy on 06/02/2022 complicated by seroma and eventually healed after replacement with a saphenous vein patch and prolonged antibiotics due to Pseudomonas infection.   She also has history of Right SFA atherectomy followed by [facility] angioplasty on 01/18/2023.  She also presented with strokelike symptoms in January 2025 and MRI was negative for stroke workup, she underwent loop recorder implantation on 08/15/2023.   She now presents for evaluation of claudication, she has bilateral knee DJD and needs replacement but she is worried about wound healing.  She also wanted to make sure her leg discomfort is not related to progression of PAD.  Cardiac Studies relevent.    ECHOCARDIOGRAM COMPLETE 08/15/2023  1. Left ventricular ejection fraction, by estimation, is 60 to 65%. The left ventricle has normal function. The left ventricle has no regional wall motion abnormalities. Left ventricular diastolic parameters are indeterminate. 2. Right ventricular systolic function is normal. The right ventricular size is normal. There is normal pulmonary artery systolic pressure. The estimated right ventricular systolic pressure is 32.4 mmHg. 3.  No significant valvular abnormality.  Lexiscan  nuclear stress test performed using 1-day protocol. SPECT images show decreased tracer uptake in inferior myocardium, likely due to breast attenuation.  LVEF 65%.  Low risk.  Carotid artery duplex 05/25/2022: Duplex suggests stenosis in the  right internal carotid artery (1-15%). No evidence of significant stenosis in the right external carotid artery. Duplex suggests stenosis in the left internal carotid artery (1-15%). No evidence of significant stenosis in the left external carotid artery.  Left Femoral endarterectomy 06/02/2022: Extensive plaque from the inguinal ligament down in to the SFA and profunda.  The patch extends onto the SFA for 1.5 cm.   Eversion endarterectomy of the profunda   Peripheral arteriogram and angioplasty right SFA 01/18/2023:     ABI 05/10/2023:   Right ABIs appear increased compared to prior study on 06/16/2022. Left  ABIs appear decreased compared to prior study on 06/16/2022.     EKG:      Discussed the use of AI scribe software for clinical note transcription with the patient, who gave verbal consent to proceed.  History of Present Illness Donna [name] is a 78 year old female with peripheral artery disease who presents with leg weakness and pain. She is accompanied by her husband.  She experiences significant weakness and aching in her legs, with the left leg more affected. Episodes of leg weakness lead to staggering, and her toes occasionally turn purple. She uses a walker but is hesitant to use it regularly. She is on multiple medications, including diuretics, and finds it challenging to manage them. Her physical activity is limited to walking around the house and occasional social outings.  Labs   Lab Results  Component Value Date   CHOL 143 08/15/2023   HDL 43 08/15/2023   LDLCALC 43 08/15/2023   TRIG 283 (H) 08/15/2023   CHOLHDL 3.3 08/15/2023   Lipoprotein (a)  Date/Time Value Ref Range Status  01/16/2023 10:01 AM 11.0 <75.0 nmol/L Final    Comment:  Note:  Values greater than or equal to 75.0 nmol/L may        indicate an independent risk factor for CHD,        but must be evaluated with caution when applied        to non-Caucasian populations due to the         influence of genetic factors on Lp(a) across        ethnicities.     Recent Labs    08/14/23 1216 08/15/23 0546 08/15/23 0551  NA 135 141  --   K 4.5 4.7  --   CL 102 109  --   CO2 23 24  --   GLUCOSE 101* 100*  --   BUN 49* 39*  --   CREATININE 1.84* 1.59* 1.63*  CALCIUM  9.3 8.8*  --   GFRNONAA 28* 33* 32*    Lab Results  Component Value Date   ALT 21 08/14/2023   AST 19 08/14/2023   ALKPHOS 40 08/14/2023   BILITOT 0.5 08/14/2023      Latest Ref Rng & Units 08/15/2023    5:51 AM 08/14/2023   12:16 PM 01/26/2023    7:44 AM  CBC  WBC 4.0 - 10.5 K/uL 6.6  6.8    Hemoglobin 12.0 - 15.0 g/dL 89.0  88.1  88.0   Hematocrit 36.0 - 46.0 % 33.5  35.1  35.0   Platelets 150 - 400 K/uL 186  198     Lab Results  Component Value Date   HGBA1C 5.9 (H) 08/15/2023    Lab Results  Component Value Date   TSH 0.51 10/26/2020    Care everywhere/Faxed External Labs:  Labs 03/18/2024:  Serum glucose 105 mg, BUN 38, creatinine 1.58, eGFR 34 mL, potassium 4.8.  Hb 11.4/HCT 36.3, platelets 232.  ROS  Review of Systems  Cardiovascular:  Positive for claudication (mild and stable probably arthritis). Negative for chest pain, dyspnea on exertion and leg swelling.   Physical Exam:   VS:  BP 112/62 (BP Location: Left Arm, Patient Position: Sitting, Cuff Size: Normal)   Pulse 65   Resp 16   Ht 5' 6 (1.676 m)   Wt 158 lb 12.8 oz (72 kg)   SpO2 97%   BMI 25.63 kg/m    Wt Readings from Last 3 Encounters:  05/02/24 158 lb 12.8 oz (72 kg)  04/08/24 156 lb (70.8 kg)  01/21/24 156 lb 12.8 oz (71.1 kg)    BP Readings from Last 3 Encounters:  05/02/24 112/62  04/08/24 125/74  01/21/24 120/79   Physical Exam Neck:     Vascular: No carotid bruit or JVD.  Cardiovascular:     Rate and Rhythm: Normal rate and regular rhythm.     Pulses: Intact distal pulses.          Femoral pulses are 1+ on the right side and 2+ on the left side.      Dorsalis pedis pulses are 0 on the right side  and 1+ on the left side.       Posterior tibial pulses are 0 on the right side and 0 on the left side.     Heart sounds: Normal heart sounds. No murmur heard.    No gallop.  Pulmonary:     Effort: Pulmonary effort is normal.     Breath sounds: Normal breath sounds.  Abdominal:     General: Bowel sounds are normal.     Palpations: Abdomen is soft.  Musculoskeletal:     Right lower leg: No edema.     Left lower leg: No edema.    EKG:    EKG Interpretation Date/Time:  Friday May 02 2024 14:10:29 EDT Ventricular Rate:  67 PR Interval:    QRS Duration:  74 QT Interval:  402 QTC Calculation: 424 R Axis:   75  Text Interpretation: EKG 05/02/2024: Probable ectopic atrial rhythm at rate of 67 bpm, normal axis, no evidence of ischemia.  Compared to 08/14/2023, ectopic atrial rhythm is new. Reconfirmed by Sanjiv Castorena, Jagadeesh ([id number]) on 05/02/2024 2:54:37 PM    ASSESSMENT AND PLAN: .      ICD-10-CM   1. PAD (peripheral artery disease)  I73.9 EKG 12-Lead    VAS US  LOWER EXTREMITY ARTERIAL DUPLEX    VAS US  ABI WITH/WO TBI    2. H/O left femoral artery endarterectomy 06/02/2022  Z98.890     3. History of revascularization procedure of lower extremity: Right SFA angioplasty 01/18/23  Z98.62     4. Stage 3b chronic kidney disease (HCC)  N18.32     5. Primary hypertension  I10       Assessment and Plan Assessment & Plan Peripheral vascular disease Chronic peripheral vascular disease with leg weakness, aching, and intermittent purple discoloration of toes. Symptoms are not suggestive of claudication. Normal capillary refill time indicates good circulation. Acrocyanosis is present but not indicative of critical limb ischemia. Previous Doppler studies were conducted 18 months ago. - Order Doppler ultrasound + ABI to assess current circulation status.  She does have absent pedal pulses that is chronic. - Encourage use of stationary bicycle or elliptical to maintain muscle bulk and  flexibility - Review and update medication list to avoid duplicates - Advised her that she should not have any issue if she were to go through with knee replacement.  Implanted cardiac loop recorder for arrhythmia monitoring Implanted cardiac loop recorder in place for arrhythmia monitoring following stroke-like symptoms, MRI negative in January 2025. Recent transmission shows no irregular heart rhythms. - Continue monitoring loop recorder transmissions  Primary hypertension with stage IIIb chronic kidney disease With regard to hypertension and stage IIIb chronic kidney disease, blood pressure is well-controlled and presently on chlorthalidone 25 mg daily and also on Maxide 37.5/25 mg daily along with amlodipine  5 mg and bisoprolol  5 mg daily.  In view of advanced kidney disease, I have encouraged her to reestablish and discuss with her nephrologist regarding changes to medications and the need for medication reconsideration and also to consider renal sparing medications like Kerendia.  I did not make any changes to her medications.  I also encouraged her to bring blood pressure medications to her appointments.  Husband present and all questions answered.  This was a 40-minute office visit encounter, extremely difficult to reconciliate her medications, review of external labs, complexity of her vascular issues and coordination of care.  CC: Dr. Mateo Romney (Nephro); Dr. Ryan Hives  Follow up: 6 months  Signed,  Gordy Bergamo, MD, [facility] 05/03/2024, 12:17 PM [facility] [address] [place], [place] [address] Phone: [phone]. Fax:  [phone]

## 2024-05-07 DIAGNOSIS — R350 Frequency of micturition: Secondary | ICD-10-CM | POA: Diagnosis not present

## 2024-05-07 DIAGNOSIS — M8589 Other specified disorders of bone density and structure, multiple sites: Secondary | ICD-10-CM | POA: Diagnosis not present

## 2024-05-07 DIAGNOSIS — Z79899 Other long term (current) drug therapy: Secondary | ICD-10-CM | POA: Diagnosis not present

## 2024-05-07 DIAGNOSIS — Z8673 Personal history of transient ischemic attack (TIA), and cerebral infarction without residual deficits: Secondary | ICD-10-CM | POA: Diagnosis not present

## 2024-05-07 DIAGNOSIS — I129 Hypertensive chronic kidney disease with stage 1 through stage 4 chronic kidney disease, or unspecified chronic kidney disease: Secondary | ICD-10-CM | POA: Diagnosis not present

## 2024-05-07 DIAGNOSIS — M810 Age-related osteoporosis without current pathological fracture: Secondary | ICD-10-CM | POA: Diagnosis not present

## 2024-05-07 DIAGNOSIS — E785 Hyperlipidemia, unspecified: Secondary | ICD-10-CM | POA: Diagnosis not present

## 2024-05-08 NOTE — Progress Notes (Signed)
 Remote Loop Recorder Transmission

## 2024-05-09 ENCOUNTER — Telehealth: Payer: Self-pay | Admitting: *Deleted

## 2024-05-09 NOTE — Telephone Encounter (Signed)
 Called patient back and discussed Afib and risks associated with condition  Highly recommended appointment with Afib clinic  Patient agreeable to this  Messaged AF clinic schedulers to call patient and assist with making appointment  All questions and concerns addressed at this time  Patient very appreciative for the information and assistance provided by this RN

## 2024-05-09 NOTE — Telephone Encounter (Signed)
 Pt called back returning call from DC

## 2024-05-09 NOTE — Telephone Encounter (Signed)
 High Alert received from [employer]  Alert remote transmission:  AF 12 logged AF events, longest duration 2hrs , EGM's c/w likely AF/AFL, burden 0.2%, no OAC, route to triage high alert per protocol Follow up as scheduled. LA, CVRS ___________________________________________________________________________  [place]orence to speak with patient about alert transmission from ILR, and assess for symptoms during event  No answer. Left message for patient to call back to device clinic  Routing to device clinic pool for follow up to discuss need for AF clinic appointment

## 2024-05-12 ENCOUNTER — Ambulatory Visit (HOSPITAL_COMMUNITY)
Admission: RE | Admit: 2024-05-12 | Discharge: 2024-05-12 | Disposition: A | Discharge status: Home / Self Care | Source: Ambulatory Visit | Attending: Physician Assistant | Admitting: Physician Assistant

## 2024-05-12 ENCOUNTER — Ambulatory Visit

## 2024-05-12 ENCOUNTER — Encounter

## 2024-05-12 VITALS — BP 136/70 | HR 66 | Ht 66.0 in | Wt 163.2 lb

## 2024-05-12 DIAGNOSIS — Z8673 Personal history of transient ischemic attack (TIA), and cerebral infarction without residual deficits: Secondary | ICD-10-CM | POA: Diagnosis not present

## 2024-05-12 DIAGNOSIS — Z7901 Long term (current) use of anticoagulants: Secondary | ICD-10-CM | POA: Diagnosis not present

## 2024-05-12 DIAGNOSIS — N189 Chronic kidney disease, unspecified: Secondary | ICD-10-CM | POA: Insufficient documentation

## 2024-05-12 DIAGNOSIS — G459 Transient cerebral ischemic attack, unspecified: Secondary | ICD-10-CM

## 2024-05-12 DIAGNOSIS — I480 Paroxysmal atrial fibrillation: Secondary | ICD-10-CM | POA: Diagnosis not present

## 2024-05-12 DIAGNOSIS — E785 Hyperlipidemia, unspecified: Secondary | ICD-10-CM | POA: Diagnosis not present

## 2024-05-12 DIAGNOSIS — I4891 Unspecified atrial fibrillation: Secondary | ICD-10-CM

## 2024-05-12 DIAGNOSIS — D6869 Other thrombophilia: Secondary | ICD-10-CM | POA: Insufficient documentation

## 2024-05-12 DIAGNOSIS — Z7902 Long term (current) use of antithrombotics/antiplatelets: Secondary | ICD-10-CM | POA: Insufficient documentation

## 2024-05-12 DIAGNOSIS — I129 Hypertensive chronic kidney disease with stage 1 through stage 4 chronic kidney disease, or unspecified chronic kidney disease: Secondary | ICD-10-CM | POA: Insufficient documentation

## 2024-05-12 DIAGNOSIS — I739 Peripheral vascular disease, unspecified: Secondary | ICD-10-CM | POA: Insufficient documentation

## 2024-05-12 LAB — CUP PACEART REMOTE DEVICE CHECK
Date Time Interrogation Session: 20251012234532
Implantable Pulse Generator Implant Date: 20250115

## 2024-05-12 MED ORDER — APIXABAN 5 MG PO TABS: 5.0000 mg | ORAL_TABLET | Freq: Two times a day (BID) | ORAL | 3 refills | Status: AC

## 2024-05-12 NOTE — Progress Notes (Signed)
 Primary Care Physician: Clarice Nottingham, MD Primary Cardiologist: Gordy Bergamo, MD Electrophysiologist: Will Gladis Norton, MD  Referring Physician: Dr Norton Erminio Donna [name] is a 78 y.o. female with a history of HTN, HLD, CKD, PAD, TIA, atrial fibrillation who presents for follow up in the [facility].  The patient was admitted 08/2023 with stroke like symptoms. Brain imagining did not reveal any acute abnormality, felt to be left-sided TIA. EP was consulted and ILR placed. The device clinic received an alert for new onset afib occurring on 05/08/24. She had ~7 hours of afib total that day. She denies any awareness of her arrhythmia. There were no specific triggers that she could identify.    Patient presents today for follow up for atrial fibrillation. She is in SR today and feels well. No history of significant bleeding events. She does consume alcohol occasionally.   Today, she denies symptoms of palpitations, chest pain, shortness of breath, orthopnea, PND, lower extremity edema, dizziness, presyncope, syncope, snoring, daytime somnolence, bleeding, or neurologic sequela. The patient is tolerating medications without difficulties and is otherwise without complaint today.    Atrial Fibrillation Risk Factors:  she does not have symptoms or diagnosis of sleep apnea. she does not have a history of rheumatic fever. she does have a history of alcohol use. The patient does not have a history of early familial atrial fibrillation or other arrhythmias.  Atrial Fibrillation Management history:  Previous antiarrhythmic drugs: none Previous cardioversions: none Previous ablations: none Anticoagulation history: none  ROS- All systems are reviewed and negative except as per the HPI above.  Past Medical History:  Diagnosis Date   Arthritis    lumbar, toes, L knee, cerv. spine & hands  - rheumatoid   Cancer (HCC)    basal cell removed fr. 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 WENDI Clara, but prev. saw Dr. Bergamo   Peripheral vascular disease    Seasonal allergies     Current Outpatient Medications  Medication Sig Dispense Refill   acetaminophen  (TYLENOL ) 325 MG tablet Take 650 mg by mouth daily as needed for moderate pain (pain score 4-6) or mild pain (pain score 1-3).     albuterol  (PROVENTIL  HFA;VENTOLIN  HFA) 108 (90 Base) MCG/ACT inhaler Inhale 2 puffs into the lungs every 4 (four) hours as needed for wheezing or shortness of breath.     amLODipine  (NORVASC ) 5 MG tablet Take 5 mg by mouth daily.     bisoprolol  (ZEBETA ) 10 MG tablet Take 10 mg by mouth daily.     BREZTRI  AEROSPHERE 160-9-4.8 MCG/ACT AERO Inhale 2 puffs into the lungs in the morning and at bedtime.     buPROPion  (WELLBUTRIN  XL) 300 MG 24 hr tablet Take 300 mg by mouth daily. (Patient taking differently: Take 450 mg by mouth daily.)     chlorthalidone (HYGROTON) 25 MG tablet Take 25 mg by mouth daily.     clopidogrel  (PLAVIX ) 75 MG tablet Take 1 tablet by  mouth once daily 90 tablet 0   DULoxetine  (CYMBALTA ) 20 MG capsule Take 20 mg by mouth daily.     Golimumab  (SIMPONI  ARIA IV) Inject into the vein every 30 (thirty) days.     HYDROXYCHLOROQUINE  SULFATE PO Take 200 mg by mouth daily.     ipratropium (ATROVENT ) 0.06 % nasal spray USE 1 SPRAY(S) IN EACH NOSTRIL ONCE DAILY IN THE MORNING 15 mL 0   Multiple Vitamins-Minerals (MULTIVITAMIN WOMEN) TABS Take 2 tablets by mouth in the morning. (Patient taking differently: Taking 2 gummie's by mouth daily)     olmesartan (BENICAR) 40 MG tablet Take 40 mg by mouth  daily.     pantoprazole  (PROTONIX ) 40 MG tablet Take 40 mg by mouth daily.     rosuvastatin  (CRESTOR ) 5 MG tablet Take 5 mg by mouth in the morning.     folic acid (FOLVITE) 1 MG tablet Take 1 mg by mouth daily.     leflunomide  (ARAVA ) 20 MG tablet Take 20 mg by mouth daily.     No current facility-administered medications for this encounter.    Physical Exam: BP 136/70   Pulse 66   Ht 5' 6 (1.676 m)   Wt 74 kg   BMI 26.34 kg/m   GEN: Well nourished, well developed in no acute distress CARDIAC: Regular rate and rhythm, no murmurs, rubs, gallops RESPIRATORY:  Clear to auscultation without rales, wheezing or rhonchi  ABDOMEN: Soft, non-tender, non-distended EXTREMITIES:  No edema; No deformity   Wt Readings from Last 3 Encounters:  05/12/24 74 kg  05/02/24 72 kg  04/08/24 70.8 kg     EKG today demonstrates  SR Vent. rate 66 BPM PR interval 150 ms QRS duration 76 ms QT/QTcB 402/421 ms   Echo 08/15/23 demonstrated   1. Left ventricular ejection fraction, by estimation, is 60 to 65%. The  left ventricle has normal function. The left ventricle has no regional  wall motion abnormalities. Left ventricular diastolic parameters are  indeterminate.   2. Right ventricular systolic function is normal. The right ventricular  size is normal. There is normal pulmonary artery systolic pressure. The  estimated right ventricular systolic pressure is 32.4 mmHg.   3. The mitral valve is normal in structure. No evidence of mitral valve  regurgitation. No evidence of mitral stenosis.   4. The aortic valve was not well visualized. Aortic valve regurgitation  is not visualized. No aortic stenosis is present.   5. The inferior vena cava is normal in size with greater than 50%  respiratory variability, suggesting right atrial pressure of 3 mmHg.    CHA2DS2-VASc Score = 7  The patient's score is based upon: CHF History: 0 HTN History: 1 Diabetes History: 0 Stroke History: 2  (TIA) Vascular Disease History: 1 Age Score: 2 Gender Score: 1       ASSESSMENT AND PLAN: Paroxysmal Atrial Fibrillation (ICD10:  I48.0) The patient's CHA2DS2-VASc score is 7, indicating a 11.2% annual risk of stroke.   General education about afib provided and questions answered. We also discussed her stroke risk and the risks and benefits of anticoagulation. D/w Dr Ganji, will stop Plavix  and start Eliquis 5 mg BID. Continue to monitor afib burden on ILR.  Secondary Hypercoagulable State (ICD10:  D68.69) The patient is at significant risk for stroke/thromboembolism based upon her CHA2DS2-VASc Score of 7.  Start Apixaban (Eliquis). Lab work from 05/08/24 in care everywhere reviewed. Hgb 12.2, Cr 1.47.  HTN Stable on current regimen  PAD Followed by Dr  Ganji Has ABI scheduled for 10/17   Follow up in the AF clinic in one month.    [facility] [address] [place], [place] [address] [phone]

## 2024-05-12 NOTE — Patient Instructions (Signed)
Stop plavix   Start Eliquis 5mg twice a day 

## 2024-05-13 NOTE — Progress Notes (Signed)
 Remote Loop Recorder Transmission

## 2024-05-14 DIAGNOSIS — M810 Age-related osteoporosis without current pathological fracture: Secondary | ICD-10-CM | POA: Diagnosis not present

## 2024-05-14 DIAGNOSIS — M0579 Rheumatoid arthritis with rheumatoid factor of multiple sites without organ or systems involvement: Secondary | ICD-10-CM | POA: Diagnosis not present

## 2024-05-16 ENCOUNTER — Ambulatory Visit (HOSPITAL_COMMUNITY)
Admission: RE | Admit: 2024-05-16 | Discharge: 2024-05-16 | Disposition: A | Discharge status: Home / Self Care | Source: Ambulatory Visit | Attending: Cardiology | Admitting: Cardiology

## 2024-05-16 ENCOUNTER — Ambulatory Visit (HOSPITAL_BASED_OUTPATIENT_CLINIC_OR_DEPARTMENT_OTHER)
Admission: RE | Admit: 2024-05-16 | Discharge: 2024-05-16 | Disposition: A | Discharge status: Home / Self Care | Source: Ambulatory Visit | Attending: Cardiology | Admitting: Cardiology

## 2024-05-16 DIAGNOSIS — I739 Peripheral vascular disease, unspecified: Secondary | ICD-10-CM

## 2024-05-19 ENCOUNTER — Ambulatory Visit: Payer: Self-pay | Admitting: Cardiology

## 2024-05-19 LAB — VAS US ABI WITH/WO TBI
Left ABI: 0.64
Right ABI: 0.76

## 2024-05-19 NOTE — Progress Notes (Signed)
 Very mild abnormality and no large vessel blockages noted. Blockages previously worked on are all open

## 2024-05-21 ENCOUNTER — Encounter: Payer: Self-pay | Admitting: Cardiology

## 2024-05-21 NOTE — Progress Notes (Signed)
 Cardiology Office Note   Date:  05/26/2024  ID:  Donna [name] 1946-07-02, MRN [id number] PCP: Clarice Nottingham, MD  [facility] Providers Cardiologist:  Gordy Bergamo, MD Electrophysiologist:  Soyla Gladis Norton, MD     History of Present Illness Donna [name] is a 78 y.o. female with a past medical history of CAD noted on CT imaging, hypertension, dyslipidemia, CKD stage IIIa, prior tobacco abuse, PAD s/p femoral endarterectomy 2023--followed by vascular, carotid artery stenosis.  05/11/2024 device check AF burden 1.4% 03/06/2024 CT of the chest three-vessel coronary artery calcifications 08/15/2023 ILR implantation 07/25/2022 carotid duplex mild carotid artery stenosis  She is a longstanding patient of Dr. Bergamo initially establishing care with him in 2019 for the evaluation and management of palpitations.  She was then not seen again until 2023 for follow-up of her PAD, she had apparently recently underwent left femoral artery endarterectomy by Dr. Serene secondary to high-grade/occlusion of the left CFA >> 01/18/2023 successful atherectomy followed by balloon angioplasty of the right SFA.  In January 2025 she presented with strokelike symptoms, workup was negative, she ultimately underwent ILR implantation on 08/15/2023.   Evaluated in the atrial fibrillation clinic on 05/12/2024, maintaining sinus rhythm,  her Plavix  was stopped and she was started on Eliquis and advised to follow-up in 1 month.  She presents today Husband for follow-up of her atrial fibrillation.  She has been doing relatively well from a cardiac perspective since she was last evaluated in the atrial fibrillation clinic, has no formal complaints.  She is unaware of her atrial fibrillation and asymptomatic. She is most bothered by knee pain, is trying to get in with Dr. Beckie to see about TKR, currently having to use a walker for ambulation secondary to pain and this is a drastic change for her. We reviewed her  recent labs, earlier in the year her lipids were very well-controlled with LDL in the 40s, a few weeks ago it was 77--she has been eating more food and obviously decreased mobility secondary to her knee pain and is put on approximately 16 pounds which explains the increase in her LDL.  She has had a few episodes of hematochezia--both following a bowel movement she also mentions that she has hemorrhoids.  She denies chest pain, palpitations, dyspnea, pnd, orthopnea, n, v, dizziness, syncope, edema, weight gain, or early satiety.     ROS: Review of Systems  Musculoskeletal:  Positive for joint pain.  All other systems reviewed and are negative.    Studies Reviewed      Cardiac Studies & Procedures   ______________________________________________________________________________________________   STRESS TESTS  PCV MYOCARDIAL PERFUSION WITH LEXISCAN  05/22/2022  Interpretation Summary Lexiscan  Tetrofosmin  stress test 05/22/2022: Lexiscan  nuclear stress test performed using 1-day protocol. SPECT images show decreased tracer uptake in inferior myocardium, likely due to breast attenuation with imaging performed in the sitting position. Stress LVEF 65%.   ECHOCARDIOGRAM  ECHOCARDIOGRAM COMPLETE 08/15/2023  Narrative ECHOCARDIOGRAM REPORT    Patient Name:   Donna [name] Date of Exam: 08/15/2023 Medical Rec #:  [id number]     Height:       66.5 in Accession #:    [id number]    Weight:       159.6 lb Date of Birth:  24-May-1946     BSA:          1.827 m Patient Age:    77 years      BP:  108/57 mmHg Patient Gender: F             HR:           80 bpm. Exam Location:  Inpatient  Procedure: 2D Echo, Cardiac Doppler and Color Doppler  Indications:    TIA  History:        Patient has prior history of Echocardiogram examinations, most recent 05/25/2022. PAD and COPD, Signs/Symptoms:Dyspnea; Risk Factors:Hypertension and Diabetes.  Sonographer:    Lanell Maduro Referring  Phys: [id number] SARA-MAIZ A THOMAS  IMPRESSIONS   1. Left ventricular ejection fraction, by estimation, is 60 to 65%. The left ventricle has normal function. The left ventricle has no regional wall motion abnormalities. Left ventricular diastolic parameters are indeterminate. 2. Right ventricular systolic function is normal. The right ventricular size is normal. There is normal pulmonary artery systolic pressure. The estimated right ventricular systolic pressure is 32.4 mmHg. 3. The mitral valve is normal in structure. No evidence of mitral valve regurgitation. No evidence of mitral stenosis. 4. The aortic valve was not well visualized. Aortic valve regurgitation is not visualized. No aortic stenosis is present. 5. The inferior vena cava is normal in size with greater than 50% respiratory variability, suggesting right atrial pressure of 3 mmHg.  FINDINGS Left Ventricle: Left ventricular ejection fraction, by estimation, is 60 to 65%. The left ventricle has normal function. The left ventricle has no regional wall motion abnormalities. The left ventricular internal cavity size was small. There is no left ventricular hypertrophy. Left ventricular diastolic parameters are indeterminate.  Right Ventricle: The right ventricular size is normal. No increase in right ventricular wall thickness. Right ventricular systolic function is normal. There is normal pulmonary artery systolic pressure. The tricuspid regurgitant velocity is 2.71 m/s, and with an assumed right atrial pressure of 3 mmHg, the estimated right ventricular systolic pressure is 32.4 mmHg.  Left Atrium: Left atrial size was normal in size.  Right Atrium: Right atrial size was normal in size.  Pericardium: There is no evidence of pericardial effusion. Presence of epicardial fat layer.  Mitral Valve: The mitral valve is normal in structure. No evidence of mitral valve regurgitation. No evidence of mitral valve stenosis.  Tricuspid Valve:  The tricuspid valve is not well visualized. Tricuspid valve regurgitation is trivial.  Aortic Valve: The aortic valve was not well visualized. Aortic valve regurgitation is not visualized. No aortic stenosis is present.  Pulmonic Valve: The pulmonic valve was not well visualized. Pulmonic valve regurgitation is not visualized.  Aorta: The aortic root is normal in size and structure.  Venous: The inferior vena cava is normal in size with greater than 50% respiratory variability, suggesting right atrial pressure of 3 mmHg.  IAS/Shunts: The atrial septum is grossly normal.   LEFT VENTRICLE PLAX 2D LVIDd:         1.73 cm     Diastology LVIDs:         2.40 cm     LV e' medial:    6.37 cm/s LV PW:         2.60 cm     LV E/e' medial:  13.7 LV IVS:        0.90 cm     LV e' lateral:   6.06 cm/s LVOT diam:     1.60 cm     LV E/e' lateral: 14.4 LV SV:         39 LV SV Index:   21 LVOT Area:     2.01 cm  LV Volumes (MOD) LV vol d, MOD A2C: 45.3 ml LV vol d, MOD A4C: 32.9 ml LV vol s, MOD A2C: 31.0 ml LV vol s, MOD A4C: 16.7 ml LV SV MOD A2C:     14.3 ml LV SV MOD A4C:     32.9 ml LV SV MOD BP:      15.8 ml  RIGHT VENTRICLE             IVC RV Basal diam:  3.40 cm     IVC diam: 0.80 cm RV S prime:     15.70 cm/s TAPSE (M-mode): 2.2 cm  LEFT ATRIUM             Index       RIGHT ATRIUM          Index LA diam:        2.00 cm 1.09 cm/m  RA Area:     8.12 cm LA Vol (A2C):   10.5 ml 5.75 ml/m  RA Volume:   16.00 ml 8.76 ml/m LA Vol (A4C):   16.1 ml 8.81 ml/m LA Biplane Vol: 13.3 ml 7.28 ml/m AORTIC VALVE LVOT Vmax:   87.30 cm/s LVOT Vmean:  59.900 cm/s LVOT VTI:    0.195 m  AORTA Ao Root diam: 2.70 cm  MITRAL VALVE               TRICUSPID VALVE MV Area (PHT): 5.02 cm    TR Peak grad:   29.4 mmHg MV Decel Time: 151 msec    TR Vmax:        271.00 cm/s MV E velocity: 87.00 cm/s MV A velocity: 84.70 cm/s  SHUNTS MV E/A ratio:  1.03        Systemic VTI:  0.20 m Systemic Diam:  1.60 cm  Lonni Nanas MD Electronically signed by Lonni Nanas MD Signature Date/Time: 08/15/2023/2:36:22 PM    Final          ______________________________________________________________________________________________      Risk Assessment/Calculations  CHA2DS2-VASc Score = 7   This indicates a 11.2% annual risk of stroke. The patient's score is based upon: CHF History: 0 HTN History: 1 Diabetes History: 0 Stroke History: 2 (TIA) Vascular Disease History: 1 Age Score: 2 Gender Score: 1            Physical Exam VS:  BP 126/76   Pulse 66   Ht 5' 6 (1.676 m)   Wt 167 lb 6.4 oz (75.9 kg)   SpO2 93%   BMI 27.02 kg/m        Wt Readings from Last 3 Encounters:  05/26/24 167 lb 6.4 oz (75.9 kg)  05/12/24 163 lb 3.2 oz (74 kg)  05/02/24 158 lb 12.8 oz (72 kg)    GEN: Well-groomed appears younger than stated age NECK: No JVD; No carotid bruits CARDIAC: RRR, no murmurs, rubs, gallops RESPIRATORY:  Clear to auscultation without rales, wheezing or rhonchi  ABDOMEN: Soft, non-tender, non-distended EXTREMITIES:  No edema; No deformity   ASSESSMENT AND PLAN PAF/hypercoagulable state -CHA2DS2-VASc score is 7, most recent device check revealed AF burden of 1.4% and she is asymptomatic, continue Eliquis 5 mg twice daily--no indication for dose reduction as her most recent lab work earlier this month revealed hemoglobin 12.2 hematocrit 37.4 creatinine 1.47.  She does mention 2 episodes of hematochezia so we will repeat a CBC.  PAD -follows with [facility].  She is on low-dose Crestor  as well as Eliquis.  Presence of ILR-this was placed following  strokelike symptoms, most recent device check revealed AF burden 1.4%, she is anticoagulated with Eliquis, and overall asymptomatic.  Dyslipidemia-most recent LDL slightly elevated at 77, LP(a) normal, currently on Crestor  5 mg daily.  Discussed increasing her Crestor  however I do think her increased LDL is secondary to  her weight gain, will continue to watch for now.  She also mention she has been drinking wine and eating cheese almost every evening and she will cut back on this as well.  Carotid artery stenosis-mild per duplex in 2023, consider repeating at next office visit.       Dispo: CBC, c-Met, follow-up with Dr. Ladona in 6 months.  Signed, Delon JAYSON Hoover, NP

## 2024-05-23 ENCOUNTER — Ambulatory Visit: Payer: Self-pay | Admitting: Cardiology

## 2024-05-26 ENCOUNTER — Ambulatory Visit: Attending: Cardiology | Admitting: Cardiology

## 2024-05-26 ENCOUNTER — Encounter: Payer: Self-pay | Admitting: Cardiology

## 2024-05-26 VITALS — BP 126/76 | HR 66 | Ht 66.0 in | Wt 167.4 lb

## 2024-05-26 DIAGNOSIS — Z95818 Presence of other cardiac implants and grafts: Secondary | ICD-10-CM | POA: Diagnosis present

## 2024-05-26 DIAGNOSIS — I739 Peripheral vascular disease, unspecified: Secondary | ICD-10-CM

## 2024-05-26 DIAGNOSIS — E782 Mixed hyperlipidemia: Secondary | ICD-10-CM | POA: Diagnosis present

## 2024-05-26 DIAGNOSIS — I480 Paroxysmal atrial fibrillation: Secondary | ICD-10-CM

## 2024-05-26 DIAGNOSIS — D6859 Other primary thrombophilia: Secondary | ICD-10-CM

## 2024-05-26 LAB — CBC

## 2024-05-26 NOTE — Patient Instructions (Addendum)
 Medication Instructions:  Your physician recommends that you continue on your current medications as directed. Please refer to the Current Medication list given to you today. *If you need a refill on your cardiac medications before your next appointment, please call your pharmacy*  Lab Work: TODAY-CBC & CMET If you have labs (blood work) drawn today and your tests are completely normal, you will receive your results only by: MyChart Message (if you have MyChart) OR A paper copy in the mail If you have any lab test that is abnormal or we need to change your treatment, we will call you to review the results.  Testing/Procedures: None ordered  Follow-Up: At [facility], you and your health needs are our priority.  As part of our continuing mission to provide you with exceptional heart care, our providers are all part of one team.  This team includes your primary Cardiologist (physician) and Advanced Practice Providers or APPs (Physician Assistants and Nurse Practitioners) who all work together to provide you with the care you need, when you need it.  Your next appointment:   6 month(s)  Provider:   Gordy Bergamo, MD    We recommend signing up for the patient portal called MyChart.  Sign up information is provided on this After Visit Summary.  MyChart is used to connect with patients for Virtual Visits (Telemedicine).  Patients are able to view lab/test results, encounter notes, upcoming appointments, etc.  Non-urgent messages can be sent to your provider as well.   To learn more about what you can do with MyChart, go to [email or web address].   Other Instructions

## 2024-05-27 ENCOUNTER — Other Ambulatory Visit: Payer: Self-pay

## 2024-05-27 ENCOUNTER — Ambulatory Visit: Payer: Self-pay | Admitting: Cardiology

## 2024-05-27 DIAGNOSIS — R0609 Other forms of dyspnea: Secondary | ICD-10-CM

## 2024-05-27 LAB — COMPREHENSIVE METABOLIC PANEL WITH GFR
ALT: 15 IU/L (ref 0–32)
AST: 21 IU/L (ref 0–40)
Albumin: 4.4 g/dL (ref 3.8–4.8)
Alkaline Phosphatase: 61 IU/L (ref 49–135)
BUN/Creatinine Ratio: 23 (ref 12–28)
BUN: 36 mg/dL — ABNORMAL HIGH (ref 8–27)
Bilirubin Total: 0.2 mg/dL (ref 0.0–1.2)
CO2: 20 mmol/L (ref 20–29)
Calcium: 9.4 mg/dL (ref 8.7–10.3)
Chloride: 103 mmol/L (ref 96–106)
Creatinine, Ser: 1.58 mg/dL — ABNORMAL HIGH (ref 0.57–1.00)
Globulin, Total: 1.9 g/dL (ref 1.5–4.5)
Glucose: 96 mg/dL (ref 70–99)
Potassium: 5.4 mmol/L — ABNORMAL HIGH (ref 3.5–5.2)
Sodium: 139 mmol/L (ref 134–144)
Total Protein: 6.3 g/dL (ref 6.0–8.5)
eGFR: 33 mL/min/1.73 — ABNORMAL LOW

## 2024-05-27 LAB — CBC
Hematocrit: 38.5 % (ref 34.0–46.6)
Hemoglobin: 12.2 g/dL (ref 11.1–15.9)
MCH: 31.1 pg (ref 26.6–33.0)
MCHC: 31.7 g/dL (ref 31.5–35.7)
MCV: 98 fL — ABNORMAL HIGH (ref 79–97)
Platelets: 254 x10E3/uL (ref 150–450)
RBC: 3.92 x10E6/uL (ref 3.77–5.28)
RDW: 13.1 % (ref 11.7–15.4)
WBC: 6.4 x10E3/uL (ref 3.4–10.8)

## 2024-05-27 NOTE — Telephone Encounter (Signed)
 Patient is returning call.

## 2024-06-09 ENCOUNTER — Ambulatory Visit (HOSPITAL_COMMUNITY): Admitting: Physician Assistant

## 2024-06-10 DIAGNOSIS — R0609 Other forms of dyspnea: Secondary | ICD-10-CM | POA: Diagnosis not present

## 2024-06-11 ENCOUNTER — Ambulatory Visit: Payer: Self-pay | Admitting: Cardiology

## 2024-06-11 LAB — BASIC METABOLIC PANEL WITH GFR
BUN/Creatinine Ratio: 23 (ref 12–28)
BUN: 30 mg/dL — ABNORMAL HIGH (ref 8–27)
CO2: 22 mmol/L (ref 20–29)
Calcium: 9.3 mg/dL (ref 8.7–10.3)
Chloride: 105 mmol/L (ref 96–106)
Creatinine, Ser: 1.32 mg/dL — ABNORMAL HIGH (ref 0.57–1.00)
Glucose: 112 mg/dL — ABNORMAL HIGH (ref 70–99)
Potassium: 4.5 mmol/L (ref 3.5–5.2)
Sodium: 140 mmol/L (ref 134–144)
eGFR: 41 mL/min/1.73 — ABNORMAL LOW

## 2024-06-11 NOTE — Progress Notes (Signed)
 Spoke with patient regarding lab results. Understands that she is to continue to drink water.

## 2024-06-12 ENCOUNTER — Ambulatory Visit (INDEPENDENT_AMBULATORY_CARE_PROVIDER_SITE_OTHER)

## 2024-06-12 DIAGNOSIS — I480 Paroxysmal atrial fibrillation: Secondary | ICD-10-CM | POA: Diagnosis not present

## 2024-06-12 LAB — CUP PACEART REMOTE DEVICE CHECK
Date Time Interrogation Session: 20251112234239
Implantable Pulse Generator Implant Date: 20250115

## 2024-06-17 ENCOUNTER — Ambulatory Visit: Payer: Self-pay | Admitting: Cardiology

## 2024-06-17 DIAGNOSIS — Z8673 Personal history of transient ischemic attack (TIA), and cerebral infarction without residual deficits: Secondary | ICD-10-CM | POA: Diagnosis not present

## 2024-06-17 DIAGNOSIS — I129 Hypertensive chronic kidney disease with stage 1 through stage 4 chronic kidney disease, or unspecified chronic kidney disease: Secondary | ICD-10-CM | POA: Diagnosis not present

## 2024-06-17 DIAGNOSIS — E785 Hyperlipidemia, unspecified: Secondary | ICD-10-CM | POA: Diagnosis not present

## 2024-06-17 DIAGNOSIS — K921 Melena: Secondary | ICD-10-CM | POA: Diagnosis not present

## 2024-06-17 DIAGNOSIS — I480 Paroxysmal atrial fibrillation: Secondary | ICD-10-CM | POA: Diagnosis not present

## 2024-06-17 DIAGNOSIS — M810 Age-related osteoporosis without current pathological fracture: Secondary | ICD-10-CM | POA: Diagnosis not present

## 2024-06-17 DIAGNOSIS — I739 Peripheral vascular disease, unspecified: Secondary | ICD-10-CM | POA: Diagnosis not present

## 2024-06-17 DIAGNOSIS — Z79899 Other long term (current) drug therapy: Secondary | ICD-10-CM | POA: Diagnosis not present

## 2024-06-17 NOTE — Progress Notes (Signed)
 Remote Loop Recorder Transmission

## 2024-07-09 DIAGNOSIS — M0579 Rheumatoid arthritis with rheumatoid factor of multiple sites without organ or systems involvement: Secondary | ICD-10-CM | POA: Diagnosis not present

## 2024-07-10 DIAGNOSIS — M1712 Unilateral primary osteoarthritis, left knee: Secondary | ICD-10-CM | POA: Diagnosis not present

## 2024-07-10 DIAGNOSIS — M25562 Pain in left knee: Secondary | ICD-10-CM | POA: Diagnosis not present

## 2024-07-11 ENCOUNTER — Telehealth (HOSPITAL_BASED_OUTPATIENT_CLINIC_OR_DEPARTMENT_OTHER): Payer: Self-pay

## 2024-07-11 ENCOUNTER — Telehealth: Payer: Self-pay

## 2024-07-11 NOTE — Telephone Encounter (Signed)
 Preop tele appt now scheduled, med rec and consent done.

## 2024-07-11 NOTE — Telephone Encounter (Signed)
 Pharmacy please advise on holding Eliquis  prior to Left total knee arthroplasty scheduled for TBD. Thank you.

## 2024-07-11 NOTE — Telephone Encounter (Signed)
° °  Name: Donna [name]  DOB: 07-Apr-1946  MRN: [id number]  Primary Cardiologist: Gordy Bergamo, MD   Preoperative team, please contact this patient and set up a phone call appointment for further preoperative risk assessment. Please obtain consent and complete medication review. Thank you for your help.  I confirm that guidance regarding antiplatelet and oral anticoagulation therapy has been completed and, if necessary, noted below.  Recommendations on Eliquis  requested from pharmacy.  I also confirmed the patient resides in the state of [place] . As per [facility] Medical Board telemedicine laws, the patient must reside in the state in which the provider is licensed.   Lamarr Satterfield, NP 07/11/2024, 1:36 PM [facility]

## 2024-07-11 NOTE — Telephone Encounter (Signed)
° °  Pre-operative Risk Assessment    Patient Name: Donna [name]  DOB: 07-09-1946 MRN: [id number]   Date of last office visit: 05/26/24 with Carlin Date of next office visit: NA  Request for Surgical Clearance    Procedure:  Left total knee arthroplasty   Date of Surgery:  Clearance TBD                                 Surgeon:  Dr. Melodi Surgeon's Group or Practice Name:  [facility] Phone number:  [phone] Fax number:  [phone]   Type of Clearance Requested:   - Medical  - Pharmacy:  Hold Apixaban  (Eliquis ) not indicated   Type of Anesthesia:  choice   Additional requests/questions:    Bonney Augustin JONETTA Delores   07/11/2024, 1:30 PM

## 2024-07-11 NOTE — Telephone Encounter (Signed)
°  Patient Consent for Virtual Visit        Donna [name] has provided verbal consent on 07/11/2024 for a virtual visit (video or telephone).   CONSENT FOR VIRTUAL VISIT FOR:  Donna [name]  By participating in this virtual visit I agree to the following:  I hereby voluntarily request, consent and authorize [facility] and its employed or contracted physicians, physician assistants, nurse practitioners or other licensed health care professionals (the Practitioner), to provide me with telemedicine health care services (the Services) as deemed necessary by the treating Practitioner. I acknowledge and consent to receive the Services by the Practitioner via telemedicine. I understand that the telemedicine visit will involve communicating with the Practitioner through live audiovisual communication technology and the disclosure of certain medical information by electronic transmission. I acknowledge that I have been given the opportunity to request an in-person assessment or other available alternative prior to the telemedicine visit and am voluntarily participating in the telemedicine visit.  I understand that I have the right to withhold or withdraw my consent to the use of telemedicine in the course of my care at any time, without affecting my right to future care or treatment, and that the Practitioner or I may terminate the telemedicine visit at any time. I understand that I have the right to inspect all information obtained and/or recorded in the course of the telemedicine visit and may receive copies of available information for a reasonable fee.  I understand that some of the potential risks of receiving the Services via telemedicine include:  Delay or interruption in medical evaluation due to technological equipment failure or disruption; Information transmitted may not be sufficient (e.g. poor resolution of images) to allow for appropriate medical decision making by the Practitioner;  and/or  In rare instances, security protocols could fail, causing a breach of personal health information.  Furthermore, I acknowledge that it is my responsibility to provide information about my medical history, conditions and care that is complete and accurate to the best of my ability. I acknowledge that Practitioner's advice, recommendations, and/or decision may be based on factors not within their control, such as incomplete or inaccurate data provided by me or distortions of diagnostic images or specimens that may result from electronic transmissions. I understand that the practice of medicine is not an exact science and that Practitioner makes no warranties or guarantees regarding treatment outcomes. I acknowledge that a copy of this consent can be made available to me via my patient portal [facility] MyChart), or I can request a printed copy by calling the office of  [facility].    I understand that my insurance will be billed for this visit.   I have read or had this consent read to me. I understand the contents of this consent, which adequately explains the benefits and risks of the Services being provided via telemedicine.  I have been provided ample opportunity to ask questions regarding this consent and the Services and have had my questions answered to my satisfaction. I give my informed consent for the services to be provided through the use of telemedicine in my medical care

## 2024-07-13 ENCOUNTER — Ambulatory Visit

## 2024-07-13 DIAGNOSIS — I480 Paroxysmal atrial fibrillation: Secondary | ICD-10-CM | POA: Diagnosis not present

## 2024-07-15 LAB — CUP PACEART REMOTE DEVICE CHECK
Date Time Interrogation Session: 20251213233129
Implantable Pulse Generator Implant Date: 20250115

## 2024-07-16 NOTE — Telephone Encounter (Signed)
 Pt has a loop recorder.   NO EP clearance needed.

## 2024-07-16 NOTE — Telephone Encounter (Signed)
 Helping in preop today.  As below, chart is pending anticoagulation recommendations for pharm then has VV scheduled 09/01/24. Anticipate to clear out in prep for VV once pharm input received.  Per chart review, patient has device but not currently established with EP. Per preop algorithm will route to [facility] device pool to help get patient scheduled for EP evaluation.

## 2024-07-17 ENCOUNTER — Ambulatory Visit: Payer: Self-pay | Admitting: Cardiology

## 2024-07-18 NOTE — Progress Notes (Signed)
 Remote Loop Recorder Transmission

## 2024-07-23 DIAGNOSIS — K219 Gastro-esophageal reflux disease without esophagitis: Secondary | ICD-10-CM | POA: Insufficient documentation

## 2024-07-23 DIAGNOSIS — R1010 Upper abdominal pain, unspecified: Secondary | ICD-10-CM | POA: Insufficient documentation

## 2024-07-23 DIAGNOSIS — K625 Hemorrhage of anus and rectum: Secondary | ICD-10-CM | POA: Insufficient documentation

## 2024-07-23 DIAGNOSIS — R6881 Early satiety: Secondary | ICD-10-CM | POA: Insufficient documentation

## 2024-07-23 DIAGNOSIS — K589 Irritable bowel syndrome without diarrhea: Secondary | ICD-10-CM | POA: Insufficient documentation

## 2024-07-23 DIAGNOSIS — Z85828 Personal history of other malignant neoplasm of skin: Secondary | ICD-10-CM | POA: Insufficient documentation

## 2024-07-23 DIAGNOSIS — H8110 Benign paroxysmal vertigo, unspecified ear: Secondary | ICD-10-CM | POA: Insufficient documentation

## 2024-07-23 DIAGNOSIS — R112 Nausea with vomiting, unspecified: Secondary | ICD-10-CM | POA: Insufficient documentation

## 2024-07-23 DIAGNOSIS — K648 Other hemorrhoids: Secondary | ICD-10-CM | POA: Insufficient documentation

## 2024-07-23 DIAGNOSIS — D2370 Other benign neoplasm of skin of unspecified lower limb, including hip: Secondary | ICD-10-CM | POA: Insufficient documentation

## 2024-07-23 NOTE — Telephone Encounter (Signed)
 Patient with diagnosis of A Fib on Eliquis  for anticoagulation. TIA 08/14/23.  Procedure: Left total knee arthroplasty  Date of procedure: TBD   CHA2DS2-VASc Score = 7  This indicates a 11.2% annual risk of stroke. The patient's score is based upon: CHF History: 0 HTN History: 1 Diabetes History: 0 Stroke History: 2 (TIA) Vascular Disease History: 1 Age Score: 2 Gender Score: 1    CrCl 41 ml/min Platelet count 254k  Patient has not had an Afib/aflutter ablation in the last 3 months, DCCV within the last 4 weeks or a watchman implanted in the last 45 days   Patient will need to hold Eliquis  for 3 days for procedure. Due to elevated risk score with history of TIA, will route to Dr Inocencio for input   **This guidance is not considered finalized until pre-operative APP has relayed final recommendations.**

## 2024-07-26 ENCOUNTER — Inpatient Hospital Stay (HOSPITAL_COMMUNITY)

## 2024-07-26 ENCOUNTER — Encounter (HOSPITAL_COMMUNITY): Payer: Self-pay | Admitting: Internal Medicine

## 2024-07-26 ENCOUNTER — Inpatient Hospital Stay (HOSPITAL_COMMUNITY)
Admission: EM | Admit: 2024-07-26 | Discharge: 2024-07-31 | DRG: 871 | Disposition: A | Discharge status: Home / Self Care | Attending: Internal Medicine | Admitting: Internal Medicine

## 2024-07-26 ENCOUNTER — Other Ambulatory Visit: Payer: Self-pay

## 2024-07-26 ENCOUNTER — Emergency Department (HOSPITAL_COMMUNITY)

## 2024-07-26 DIAGNOSIS — F32A Depression, unspecified: Secondary | ICD-10-CM | POA: Diagnosis present

## 2024-07-26 DIAGNOSIS — E872 Acidosis, unspecified: Secondary | ICD-10-CM | POA: Diagnosis present

## 2024-07-26 DIAGNOSIS — Z8616 Personal history of COVID-19: Secondary | ICD-10-CM | POA: Diagnosis not present

## 2024-07-26 DIAGNOSIS — M069 Rheumatoid arthritis, unspecified: Secondary | ICD-10-CM | POA: Diagnosis present

## 2024-07-26 DIAGNOSIS — J9601 Acute respiratory failure with hypoxia: Secondary | ICD-10-CM | POA: Diagnosis present

## 2024-07-26 DIAGNOSIS — N189 Chronic kidney disease, unspecified: Secondary | ICD-10-CM | POA: Diagnosis not present

## 2024-07-26 DIAGNOSIS — I480 Paroxysmal atrial fibrillation: Secondary | ICD-10-CM | POA: Diagnosis present

## 2024-07-26 DIAGNOSIS — J441 Chronic obstructive pulmonary disease with (acute) exacerbation: Secondary | ICD-10-CM | POA: Diagnosis present

## 2024-07-26 DIAGNOSIS — Z95818 Presence of other cardiac implants and grafts: Secondary | ICD-10-CM

## 2024-07-26 DIAGNOSIS — Z1152 Encounter for screening for COVID-19: Secondary | ICD-10-CM | POA: Diagnosis not present

## 2024-07-26 DIAGNOSIS — R6520 Severe sepsis without septic shock: Secondary | ICD-10-CM | POA: Diagnosis present

## 2024-07-26 DIAGNOSIS — E861 Hypovolemia: Secondary | ICD-10-CM | POA: Diagnosis present

## 2024-07-26 DIAGNOSIS — A419 Sepsis, unspecified organism: Secondary | ICD-10-CM | POA: Diagnosis present

## 2024-07-26 DIAGNOSIS — N179 Acute kidney failure, unspecified: Secondary | ICD-10-CM | POA: Diagnosis present

## 2024-07-26 DIAGNOSIS — K219 Gastro-esophageal reflux disease without esophagitis: Secondary | ICD-10-CM | POA: Diagnosis present

## 2024-07-26 DIAGNOSIS — N183 Chronic kidney disease, stage 3 unspecified: Secondary | ICD-10-CM | POA: Diagnosis present

## 2024-07-26 DIAGNOSIS — E785 Hyperlipidemia, unspecified: Secondary | ICD-10-CM | POA: Diagnosis present

## 2024-07-26 DIAGNOSIS — Z85828 Personal history of other malignant neoplasm of skin: Secondary | ICD-10-CM

## 2024-07-26 DIAGNOSIS — J440 Chronic obstructive pulmonary disease with acute lower respiratory infection: Secondary | ICD-10-CM | POA: Diagnosis present

## 2024-07-26 DIAGNOSIS — Z7951 Long term (current) use of inhaled steroids: Secondary | ICD-10-CM

## 2024-07-26 DIAGNOSIS — I739 Peripheral vascular disease, unspecified: Secondary | ICD-10-CM | POA: Diagnosis present

## 2024-07-26 DIAGNOSIS — R7989 Other specified abnormal findings of blood chemistry: Secondary | ICD-10-CM | POA: Diagnosis not present

## 2024-07-26 DIAGNOSIS — E871 Hypo-osmolality and hyponatremia: Secondary | ICD-10-CM | POA: Diagnosis present

## 2024-07-26 DIAGNOSIS — I10 Essential (primary) hypertension: Secondary | ICD-10-CM | POA: Diagnosis not present

## 2024-07-26 DIAGNOSIS — I2510 Atherosclerotic heart disease of native coronary artery without angina pectoris: Secondary | ICD-10-CM | POA: Diagnosis present

## 2024-07-26 DIAGNOSIS — R7401 Elevation of levels of liver transaminase levels: Secondary | ICD-10-CM | POA: Diagnosis not present

## 2024-07-26 DIAGNOSIS — J189 Pneumonia, unspecified organism: Secondary | ICD-10-CM

## 2024-07-26 DIAGNOSIS — L814 Other melanin hyperpigmentation: Secondary | ICD-10-CM | POA: Insufficient documentation

## 2024-07-26 DIAGNOSIS — E876 Hypokalemia: Secondary | ICD-10-CM | POA: Diagnosis present

## 2024-07-26 DIAGNOSIS — Z888 Allergy status to other drugs, medicaments and biological substances status: Secondary | ICD-10-CM

## 2024-07-26 DIAGNOSIS — Z860100 Personal history of colon polyps, unspecified: Secondary | ICD-10-CM | POA: Insufficient documentation

## 2024-07-26 DIAGNOSIS — Z8249 Family history of ischemic heart disease and other diseases of the circulatory system: Secondary | ICD-10-CM

## 2024-07-26 DIAGNOSIS — Z7901 Long term (current) use of anticoagulants: Secondary | ICD-10-CM

## 2024-07-26 DIAGNOSIS — Z981 Arthrodesis status: Secondary | ICD-10-CM

## 2024-07-26 DIAGNOSIS — I129 Hypertensive chronic kidney disease with stage 1 through stage 4 chronic kidney disease, or unspecified chronic kidney disease: Secondary | ICD-10-CM | POA: Diagnosis present

## 2024-07-26 DIAGNOSIS — Z801 Family history of malignant neoplasm of trachea, bronchus and lung: Secondary | ICD-10-CM

## 2024-07-26 DIAGNOSIS — Z8051 Family history of malignant neoplasm of kidney: Secondary | ICD-10-CM

## 2024-07-26 DIAGNOSIS — E860 Dehydration: Secondary | ICD-10-CM | POA: Diagnosis present

## 2024-07-26 DIAGNOSIS — Z8673 Personal history of transient ischemic attack (TIA), and cerebral infarction without residual deficits: Secondary | ICD-10-CM

## 2024-07-26 DIAGNOSIS — Z96641 Presence of right artificial hip joint: Secondary | ICD-10-CM | POA: Diagnosis present

## 2024-07-26 DIAGNOSIS — J449 Chronic obstructive pulmonary disease, unspecified: Secondary | ICD-10-CM | POA: Diagnosis present

## 2024-07-26 DIAGNOSIS — Z87891 Personal history of nicotine dependence: Secondary | ICD-10-CM

## 2024-07-26 DIAGNOSIS — R748 Abnormal levels of other serum enzymes: Secondary | ICD-10-CM | POA: Diagnosis present

## 2024-07-26 DIAGNOSIS — Z79899 Other long term (current) drug therapy: Secondary | ICD-10-CM

## 2024-07-26 LAB — CBC WITH DIFFERENTIAL/PLATELET
Abs Immature Granulocytes: 0.23 K/uL — ABNORMAL HIGH (ref 0.00–0.07)
Basophils Absolute: 0.1 K/uL (ref 0.0–0.1)
Basophils Relative: 1 %
Eosinophils Absolute: 0 K/uL (ref 0.0–0.5)
Eosinophils Relative: 0 %
HCT: 38.8 % (ref 36.0–46.0)
Hemoglobin: 12.8 g/dL (ref 12.0–15.0)
Immature Granulocytes: 1 %
Lymphocytes Relative: 7 %
Lymphs Abs: 1.4 K/uL (ref 0.7–4.0)
MCH: 31.1 pg (ref 26.0–34.0)
MCHC: 33 g/dL (ref 30.0–36.0)
MCV: 94.4 fL (ref 80.0–100.0)
Monocytes Absolute: 2.2 K/uL — ABNORMAL HIGH (ref 0.1–1.0)
Monocytes Relative: 11 %
Neutro Abs: 16.5 K/uL — ABNORMAL HIGH (ref 1.7–7.7)
Neutrophils Relative %: 80 %
Platelets: 284 K/uL (ref 150–400)
RBC: 4.11 MIL/uL (ref 3.87–5.11)
RDW: 13.3 % (ref 11.5–15.5)
WBC: 20.4 K/uL — ABNORMAL HIGH (ref 4.0–10.5)
nRBC: 0 % (ref 0.0–0.2)

## 2024-07-26 LAB — COMPREHENSIVE METABOLIC PANEL WITH GFR
ALT: 49 U/L — ABNORMAL HIGH (ref 0–44)
AST: 56 U/L — ABNORMAL HIGH (ref 15–41)
Albumin: 3.7 g/dL (ref 3.5–5.0)
Alkaline Phosphatase: 87 U/L (ref 38–126)
Anion gap: 22 — ABNORMAL HIGH (ref 5–15)
BUN: 74 mg/dL — ABNORMAL HIGH (ref 8–23)
CO2: 17 mmol/L — ABNORMAL LOW (ref 22–32)
Calcium: 8.9 mg/dL (ref 8.9–10.3)
Chloride: 92 mmol/L — ABNORMAL LOW (ref 98–111)
Creatinine, Ser: 3.57 mg/dL — ABNORMAL HIGH (ref 0.44–1.00)
GFR, Estimated: 12 mL/min — ABNORMAL LOW
Glucose, Bld: 148 mg/dL — ABNORMAL HIGH (ref 70–99)
Potassium: 3.7 mmol/L (ref 3.5–5.1)
Sodium: 131 mmol/L — ABNORMAL LOW (ref 135–145)
Total Bilirubin: 0.4 mg/dL (ref 0.0–1.2)
Total Protein: 7.7 g/dL (ref 6.5–8.1)

## 2024-07-26 LAB — RESPIRATORY PANEL BY PCR

## 2024-07-26 LAB — URINALYSIS, W/ REFLEX TO CULTURE (INFECTION SUSPECTED)
Bilirubin Urine: NEGATIVE
Glucose, UA: NEGATIVE mg/dL
Ketones, ur: NEGATIVE mg/dL
Leukocytes,Ua: NEGATIVE
Nitrite: NEGATIVE
Protein, ur: 30 mg/dL — AB
Specific Gravity, Urine: 1.014 (ref 1.005–1.030)
pH: 5 (ref 5.0–8.0)

## 2024-07-26 LAB — RESP PANEL BY RT-PCR (RSV, FLU A&B, COVID)  RVPGX2
Influenza A by PCR: NEGATIVE
Influenza B by PCR: NEGATIVE
Resp Syncytial Virus by PCR: NEGATIVE
SARS Coronavirus 2 by RT PCR: NEGATIVE

## 2024-07-26 LAB — PRO BRAIN NATRIURETIC PEPTIDE: Pro Brain Natriuretic Peptide: 3895 pg/mL — ABNORMAL HIGH

## 2024-07-26 LAB — LACTIC ACID, PLASMA: Lactic Acid, Venous: 2.4 mmol/L (ref 0.5–1.9)

## 2024-07-26 LAB — BLOOD GAS, VENOUS
Acid-base deficit: 6.6 mmol/L — ABNORMAL HIGH (ref 0.0–2.0)
Bicarbonate: 19.1 mmol/L — ABNORMAL LOW (ref 20.0–28.0)
O2 Saturation: 55.9 %
Patient temperature: 37
pCO2, Ven: 38 mmHg — ABNORMAL LOW (ref 44–60)
pH, Ven: 7.31 (ref 7.25–7.43)
pO2, Ven: 37 mmHg (ref 32–45)

## 2024-07-26 LAB — PROCALCITONIN: Procalcitonin: 1.58 ng/mL

## 2024-07-26 LAB — I-STAT CG4 LACTIC ACID, ED: Lactic Acid, Venous: 3.4 mmol/L (ref 0.5–1.9)

## 2024-07-26 MED ORDER — SODIUM CHLORIDE 0.9 % IV SOLN
1.0000 g | INTRAVENOUS | Status: DC
  Administered 2024-07-27: 1 g via INTRAVENOUS
  Filled 2024-07-26 (×2): qty 10

## 2024-07-26 MED ORDER — ACETAMINOPHEN 325 MG PO TABS
650.0000 mg | ORAL_TABLET | Freq: Four times a day (QID) | ORAL | Status: DC | PRN
  Administered 2024-07-29: 650 mg via ORAL
  Filled 2024-07-26 (×4): qty 2

## 2024-07-26 MED ORDER — LORATADINE 10 MG PO TABS
10.0000 mg | ORAL_TABLET | Freq: Every day | ORAL | Status: DC | PRN
  Administered 2024-07-26 – 2024-07-28 (×2): 10 mg via ORAL
  Filled 2024-07-26 (×3): qty 1

## 2024-07-26 MED ORDER — IPRATROPIUM-ALBUTEROL 0.5-2.5 (3) MG/3ML IN SOLN
3.0000 mL | Freq: Once | RESPIRATORY_TRACT | Status: AC
  Administered 2024-07-26: 3 mL via RESPIRATORY_TRACT
  Filled 2024-07-26: qty 3

## 2024-07-26 MED ORDER — ALBUTEROL SULFATE (2.5 MG/3ML) 0.083% IN NEBU
2.5000 mg | INHALATION_SOLUTION | RESPIRATORY_TRACT | Status: DC | PRN
  Administered 2024-07-26 – 2024-07-27 (×2): 2.5 mg via RESPIRATORY_TRACT
  Filled 2024-07-26 (×2): qty 3

## 2024-07-26 MED ORDER — SODIUM CHLORIDE 0.9 % IV SOLN
500.0000 mg | INTRAVENOUS | Status: DC
  Administered 2024-07-27: 500 mg via INTRAVENOUS
  Filled 2024-07-26 (×2): qty 5

## 2024-07-26 MED ORDER — SODIUM CHLORIDE 0.9 % IV SOLN
2.0000 g | Freq: Once | INTRAVENOUS | Status: AC
  Administered 2024-07-26: 2 g via INTRAVENOUS
  Filled 2024-07-26: qty 20

## 2024-07-26 MED ORDER — APIXABAN 5 MG PO TABS
5.0000 mg | ORAL_TABLET | Freq: Two times a day (BID) | ORAL | Status: DC
  Administered 2024-07-26 – 2024-07-31 (×10): 5 mg via ORAL
  Filled 2024-07-26 (×8): qty 1

## 2024-07-26 MED ORDER — PREDNISONE 20 MG PO TABS
40.0000 mg | ORAL_TABLET | Freq: Every day | ORAL | Status: DC
  Filled 2024-07-26: qty 2

## 2024-07-26 MED ORDER — ACETAMINOPHEN 650 MG RE SUPP: 650.0000 mg | Freq: Four times a day (QID) | RECTAL | Status: DC | PRN

## 2024-07-26 MED ORDER — ONDANSETRON HCL 4 MG/2ML IJ SOLN: 4.0000 mg | Freq: Four times a day (QID) | INTRAMUSCULAR | Status: DC | PRN

## 2024-07-26 MED ORDER — HYDROXYCHLOROQUINE SULFATE 200 MG PO TABS
200.0000 mg | ORAL_TABLET | Freq: Every day | ORAL | Status: DC
  Administered 2024-07-26 – 2024-07-31 (×6): 200 mg via ORAL
  Filled 2024-07-26 (×5): qty 1

## 2024-07-26 MED ORDER — ORAL CARE MOUTH RINSE: 15.0000 mL | OROMUCOSAL | Status: DC | PRN

## 2024-07-26 MED ORDER — LACTATED RINGERS IV BOLUS (SEPSIS): 500.0000 mL | Freq: Once | INTRAVENOUS | Status: DC

## 2024-07-26 MED ORDER — LACTATED RINGERS IV SOLN: INTRAVENOUS | Status: AC

## 2024-07-26 MED ORDER — ONDANSETRON HCL 4 MG PO TABS
4.0000 mg | ORAL_TABLET | Freq: Four times a day (QID) | ORAL | Status: DC | PRN
  Filled 2024-07-26 (×4): qty 1

## 2024-07-26 MED ORDER — LACTATED RINGERS IV BOLUS (SEPSIS): 1000.0000 mL | Freq: Once | INTRAVENOUS | Status: DC

## 2024-07-26 MED ORDER — SODIUM CHLORIDE 0.9 % IV SOLN
500.0000 mg | Freq: Once | INTRAVENOUS | Status: AC
  Administered 2024-07-26: 500 mg via INTRAVENOUS
  Filled 2024-07-26: qty 5

## 2024-07-26 MED ORDER — GUAIFENESIN-DM 100-10 MG/5ML PO SYRP
5.0000 mL | ORAL_SOLUTION | ORAL | Status: DC | PRN
  Administered 2024-07-26 – 2024-07-30 (×9): 5 mL via ORAL
  Filled 2024-07-26: qty 10
  Filled 2024-07-26 (×7): qty 5
  Filled 2024-07-26 (×2): qty 10
  Filled 2024-07-26: qty 5

## 2024-07-26 MED ORDER — LACTATED RINGERS IV BOLUS (SEPSIS)
1000.0000 mL | Freq: Once | INTRAVENOUS | Status: AC
  Administered 2024-07-26: 1000 mL via INTRAVENOUS

## 2024-07-26 MED ORDER — LACTATED RINGERS IV BOLUS
1000.0000 mL | Freq: Once | INTRAVENOUS | Status: AC
  Administered 2024-07-26: 1000 mL via INTRAVENOUS

## 2024-07-26 NOTE — ED Provider Notes (Signed)
 "  EMERGENCY DEPARTMENT AT [facility] Provider Note   CSN: [id number] Arrival date & time: 07/26/24  1119     History No chief complaint on file.   HPI: Donna [name] is a 78 y.o. female with history pertinent for CAD, arthritis, COPD not on home oxygen , GERD, hypertension, hyperlipidemia, prior TIA who presents complaining of multiple symptoms. Patient arrived via EMS accompanied by husband.  History provided by patient.  No interpreter required during this encounter.  Patient has had 5 days of multiple symptoms, cough, congestion, rhinorrhea, fatigue, myalgias, diarrhea.  Is up-to-date on vaccinations, did receive a flu shot this year.  Reports that she has a history of COPD, does not use home nebulizers, does not wear oxygen  regularly at home, they do have some oxygen  indican that their daughter in [place]  sent them.  She has also had decreased urinary output over the past 24 hours.  Was placed on oxygen  and route with EMS.  Patient's recorded medical, surgical, social, medication list and allergies were reviewed in the Snapshot window as part of the initial history.   Prior to Admission medications  Medication Sig Start Date End Date Taking? Authorizing Provider  acetaminophen  (TYLENOL ) 325 MG tablet Take 650 mg by mouth daily as needed for moderate pain (pain score 4-6) or mild pain (pain score 1-3).    [provider]  albuterol  (PROVENTIL  HFA;VENTOLIN  HFA) 108 (90 Base) MCG/ACT inhaler Inhale 2 puffs into the lungs every 4 (four) hours as needed for wheezing or shortness of breath.    [provider]  amLODipine  (NORVASC ) 5 MG tablet Take 5 mg by mouth daily.    [provider]  apixaban  (ELIQUIS ) 5 MG TABS tablet Take 1 tablet (5 mg total) by mouth 2 (two) times daily. 05/12/24   Fenton, Clint R, PA  bisoprolol  (ZEBETA ) 10 MG tablet Take 10 mg by mouth daily. 04/29/24   [provider]  BREZTRI  AEROSPHERE 160-9-4.8 MCG/ACT AERO  Inhale 2 puffs into the lungs in the morning and at bedtime. 08/04/23   [provider]  buPROPion  (WELLBUTRIN  XL) 300 MG 24 hr tablet Take 300 mg by mouth daily. Patient taking differently: Take 450 mg by mouth daily.    [provider]  chlorthalidone  (HYGROTON ) 25 MG tablet Take 25 mg by mouth daily.    [provider]  DULoxetine  (CYMBALTA ) 20 MG capsule Take 20 mg by mouth daily.    [provider]  folic acid (FOLVITE) 1 MG tablet Take 1 mg by mouth daily.    [provider]  Golimumab  (SIMPONI  ARIA IV) Inject into the vein every 30 (thirty) days.    [provider]  HYDROXYCHLOROQUINE  SULFATE PO Take 200 mg by mouth daily.    [provider]  ipratropium (ATROVENT ) 0.06 % nasal spray USE 1 SPRAY(S) IN EACH NOSTRIL ONCE DAILY IN THE MORNING 02/11/24   [name], [name], MD  Multiple Vitamins-Minerals (MULTIVITAMIN WOMEN) TABS Take 2 tablets by mouth in the morning. Patient taking differently: Taking 2 gummie's by mouth daily    [provider]  olmesartan (BENICAR) 40 MG tablet Take 40 mg by mouth daily. 05/07/24   [provider]  pantoprazole  (PROTONIX ) 40 MG tablet Take 40 mg by mouth daily.    [provider]  rosuvastatin  (CRESTOR ) 5 MG tablet Take 5 mg by mouth in the morning. 04/11/22   [provider]  Vitamin D , Cholecalciferol , 25 MCG (1000 UT) CAPS 1 capsule. 05/08/24  [provider]     Allergies: Thorazine [chlorpromazine]   Review of Systems   ROS as per HPI  Physical Exam Updated Vital Signs BP 110/60   Pulse 87   Temp 98.9 F (37.2 C) (Oral)   Resp (!) 25   Ht 5' 6 (1.676 m)   SpO2 97%   BMI 27.02 kg/m  Physical Exam Vitals and nursing note reviewed.  Constitutional:      General: She is not in acute distress.    Appearance: She is well-developed.  HENT:     Head: Normocephalic and atraumatic.     Nose: Congestion and rhinorrhea present.  Eyes:      Conjunctiva/sclera: Conjunctivae normal.     Comments: Coryza  Cardiovascular:     Rate and Rhythm: Regular rhythm. Tachycardia present.     Heart sounds: No murmur heard. Pulmonary:     Effort: Pulmonary effort is normal. No respiratory distress.     Breath sounds: Wheezing and rhonchi present.     Comments: Diffuse rhonchi, scattered wheezes Abdominal:     Palpations: Abdomen is soft.     Tenderness: There is no abdominal tenderness.  Musculoskeletal:        General: No swelling.     Cervical back: Neck supple.  Skin:    General: Skin is warm and dry.     Capillary Refill: Capillary refill takes less than 2 seconds.  Neurological:     Mental Status: She is alert.  Psychiatric:        Mood and Affect: Mood normal.     ED Course/ Medical Decision Making/ A&P    Procedures Procedures   Medications Ordered in ED Medications  lactated ringers  infusion ( Intravenous New Bag/Given 07/26/24 1246)  lactated ringers  bolus 1,000 mL (1,000 mLs Intravenous New Bag/Given 07/26/24 1241)    And  lactated ringers  bolus 1,000 mL (has no administration in time range)    And  lactated ringers  bolus 500 mL (has no administration in time range)  predniSONE  (DELTASONE ) tablet 40 mg (has no administration in time range)  cefTRIAXone  (ROCEPHIN ) 1 g in sodium chloride  0.9 % 100 mL IVPB (has no administration in time range)  azithromycin  (ZITHROMAX ) 500 mg in sodium chloride  0.9 % 250 mL IVPB (has no administration in time range)  acetaminophen  (TYLENOL ) tablet 650 mg (has no administration in time range)    Or  acetaminophen  (TYLENOL ) suppository 650 mg (has no administration in time range)  ondansetron  (ZOFRAN ) tablet 4 mg (has no administration in time range)    Or  ondansetron  (ZOFRAN ) injection 4 mg (has no administration in time range)  albuterol  (PROVENTIL ) (2.5 MG/3ML) 0.083% nebulizer solution 2.5 mg (has no administration in time range)  lactated ringers  bolus 1,000 mL (1,000 mLs  Intravenous New Bag/Given 07/26/24 1218)  cefTRIAXone  (ROCEPHIN ) 2 g in sodium chloride  0.9 % 100 mL IVPB (0 g Intravenous Stopped 07/26/24 1340)  azithromycin  (ZITHROMAX ) 500 mg in sodium chloride  0.9 % 250 mL IVPB (500 mg Intravenous New Bag/Given 07/26/24 1343)  ipratropium-albuterol  (DUONEB) 0.5-2.5 (3) MG/3ML nebulizer solution 3 mL (3 mLs Nebulization Given 07/26/24 1347)    Medical Decision Making:   SHAMIAH KAHLER is a 78 y.o. female who presents for notable symptoms as per above.  Physical exam is pertinent for tachycardia, diffuse wheezing and rhonchi, coryza.   The differential includes but is not limited to sepsis, viral illness, influenza, pneumonia.  Independent historian: Spouse/partner  External data reviewed: Labs: reviewed prior labs for  baseline  Initial Plan:  Screening labs including CBC and Metabolic panel to evaluate for infectious or metabolic etiology of disease.  BNP to evaluate volume status COVID/flu/RSV given patient has multiple viral symptoms VBG to evaluate for hypercarbic respiratory failure Lactic acid evaluate for sepsis Urinalysis with reflex culture ordered to evaluate for UTI or relevant urologic/nephrologic pathology.  Chest x-ray to evaluate for structural/infectious intra-thoracic pathology.  EKG to evaluate for cardiac pathology Objective evaluation as below reviewed   Labs: Ordered, Independent interpretation, and Details: Lactic acid elevated at 3.4. VBG without acidosis or hypercarbia.  UA without UTI.  CBC with significant leukocytosis to 20 with left shift.  No anemia or thrombocytopenia.  CMP with significant AKI to 3.5 from baseline of 1-1/2.  COVID/flu/RSV negative.  Radiology: Ordered, Independent interpretation, Details: Chest x-ray without focal airspace opacification, cardiomediastinal silhouette gentian, pneumothorax, pleural effusion, bony derangement, and All images reviewed independently.  Agree with radiology report at this time.    CT Chest Wo Contrast Result Date: 07/26/2024 CLINICAL DATA:  Pneumonia, complication suspected. Flu-like symptoms. History of COPD. EXAM: CT CHEST WITHOUT CONTRAST TECHNIQUE: Multidetector CT imaging of the chest was performed following the standard protocol without IV contrast. RADIATION DOSE REDUCTION: This exam was performed according to the departmental dose-optimization program which includes automated exposure control, adjustment of the mA and/or kV according to patient size and/or use of iterative reconstruction technique. COMPARISON:  02/27/2024. FINDINGS: Cardiovascular: The heart is normal in size and there is a trace pericardial effusion. Multi-vessel coronary artery calcifications are noted. There is atherosclerotic calcification of the aorta without evidence of aneurysm. Pulmonary trunk is normal in caliber. Mediastinum/Nodes: No mediastinal or axillary lymphadenopathy is seen. Evaluation of the hila is limited due to lack of IV contrast. The thyroid  gland and trachea are within normal limits. There is thickening of the walls of the distal esophagus with likely small hiatal hernia, unchanged. Lungs/Pleura: Paraseptal and centrilobular emphysematous changes are present in the lungs. Apical pleural and parenchymal scarring is noted bilaterally. There are multifocal tree-in-bud nodular opacities, bronchial wall thickening, and scattered airspace disease bilaterally. Consolidation is present in the left lower lobe. There is a trace left pleural effusion. No pneumothorax is seen. Previously described nodular opacities are unchanged. Upper Abdomen: No acute abnormality. Musculoskeletal: Bilateral breast implants are noted. Degenerative changes are present in the thoracic spine. No acute osseous abnormality is seen. IMPRESSION: 1. Bilateral tree-in-bud nodules, bronchial wall thickening, and scattered airspace disease, with left basilar consolidation, suggesting multifocal pneumonia. 2. Trace left pleural  effusion. 3. Previously described nodular opacities are unchanged. Three to six-month follow-up is recommended to document resolution of new tree-in-bud nodular opacities. 4. Emphysema. 5. Stable thickening of the walls of the distal esophagus with small hiatal hernia. 6. Coronary artery calcifications. 7. Aortic atherosclerosis. Electronically Signed   By: Leita Birmingham M.D.   On: 07/26/2024 16:06   DG Chest Port 1 View Result Date: 07/26/2024 CLINICAL DATA:  Flu-like symptoms. EXAM: PORTABLE CHEST 1 VIEW COMPARISON:  08/15/2023 FINDINGS: Lungs are hyperexpanded. The lungs are clear without focal pneumonia, edema, pneumothorax or pleural effusion. Asymmetric soft tissue density noted in the right lung apex medially. Interstitial markings are diffusely coarsened with chronic features. The cardiopericardial silhouette is within normal limits for size. Telemetry leads overlie the chest. IMPRESSION: 1. Asymmetric soft tissue density in the right lung apex medially. CT chest without contrast recommended to further evaluate. 2. Hyperexpansion with chronic interstitial coarsening. Electronically Signed   By: Camellia Minus HERO.D.  On: 07/26/2024 13:38   CUP PACEART REMOTE DEVICE CHECK Result Date: 07/15/2024 ILR summary report received. Battery status OK. Normal device function. No new symptom, tachy, brady, or pause episodes. No new AF episodes. Monthly summary reports and ROV/PRN LA, CVRS   EKG/Medicine tests: Ordered and Independent interpretation EKG Interpretation:                  Interventions: LR bolus, DuoNeb, ceftriaxone , azithromycin   See the EMR for full details regarding lab and imaging results.  ***  {LSCOPA:33420}  Discussion of management or test interpretations with external provider(s): ***  Risk Drugs:{LSDRUGS:33399} Treatment: {LSTREATMENT:33409} Surgery:{LSSURGERY:33410} Critical Care: ***  Disposition: {LSDISPO:33388}  MDM generated using voice dictation software and  may contain dictation errors.  Please contact me for any clarification or with any questions.  Clinical Impression:  1. Sepsis with acute hypoxic respiratory failure without septic shock, due to unspecified organism [facility])      Admit   Final Clinical Impression(s) / ED Diagnoses Final diagnoses:  Sepsis with acute hypoxic respiratory failure without septic shock, due to unspecified organism [facility])    Rx / DC Orders ED Discharge Orders     None      "

## 2024-07-26 NOTE — ED Notes (Signed)
 Pt to CT

## 2024-07-26 NOTE — H&P (Signed)
 " History and Physical    Patient: Donna [name] FMW:[id number] DOB: January 01, 1946 DOA: 07/26/2024 DOS: the patient was seen and examined on 07/26/2024 PCP: Clarice Nottingham, MD  Patient coming from: Home  Chief Complaint: Viral symptoms or 5 days.  HPI: Donna [name] is a 78 y.o. female with medical history significant of osteoarthritis, CAD, facial basal cell carcinoma, stage III CKD, COPD, COVID-19, depression, GERD, hiatal hernia, hyperlipidemia, hypertension, peripheral vascular disease, seasonal allergies who presented to the emergency department complaints of mild dyspnea, wheezing, cough mostly nonproductive, sinus congestion, rhinorrhea, sore throat, arthralgias, myalgias, about 3 episodes of diarrhea, fatigue, malaise for the past 5 days and anuria for the past 24 hours.  No hemoptysis.  She thinks she got exposed to someone at church as they were several church members with URI symptoms.  No chest pain, palpitations, diaphoresis, PND, orthopnea or pitting edema of the lower extremities.  No abdominal pain, nausea, emesis, constipation, melena or hematochezia.  No flank pain, dysuria, frequency or hematuria.  No polyuria, polydipsia, polyphagia or blurred vision.   Lab work: CBC showed a white count of 20.4 with 80% neutrophils, hemoglobin 12.8 g/dL and platelets 715.  Lactic acid 3.4 mmol/L.  Venous gas showed a pH of 7.31, pCO2 38 and pO2 37 mmHg, acid-base deficit 6.6 and bicarbonate of 19.1 mmol/L.  Negative coronavirus, influenza and RSV PCR testing.  Procalcitonin 1.58 ng/mL.  proBNP 30 8095 pg/mL.  CMP showed a sodium 131, potassium 3.7, chloride 92 and CO2 17 mmol/L with an anion gap of 22.  AST was 56 and ALT 49 units/L with the rest of the hepatic functions within expected range.  Glucose 148, BUN 74, creatinine 3.57 and corrected calcium  9.1 mg/dL.  Imaging: Portable 1 view chest radiograph showing an asymmetric soft tissue density in the right lung apex medially.  CT chest without  contrast recommended to further evaluate.  Hyperexpansion with Cron interstitial coarsening.   ED course: Initial vital signs were temperature 98.9 F, pulse 97, respirations 16, BP 115/76 mmHg and O2 sat 99% on nasal cannula at 2 LPM.  The patient received 2000 mL of LR bolus followed by LR at 150 mL/h x 20 hours, a DuoNeb, ceftriaxone  1 g IVPB and azithromycin  500 mg IVPB.  Review of Systems: As mentioned in the history of present illness. All other systems reviewed and are negative.  Past Medical History:  Diagnosis Date   Arthritis    lumbar, toes, L knee, cerv. spine & hands  - rheumatoid   CAD (coronary artery disease) 2025   Noted on CT imaging in 2025   Cancer (HCC)    basal cell removed fr. 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 WENDI Clara, but prev. saw Dr. Ladona   Peripheral vascular disease    Seasonal allergies    Past Surgical History:  Procedure Laterality Date   ABDOMINAL AORTOGRAM W/LOWER EXTREMITY N/A 05/30/2022   Procedure: ABDOMINAL AORTOGRAM W/LOWER EXTREMITY;  Surgeon: [name], MD;  Location: [facility] INVASIVE  CV LAB;  Service: Cardiovascular;  Laterality: N/A;   APPLICATION OF WOUND VAC Left 07/05/2022   Procedure: APPLICATION OF WOUND VAC;  Surgeon: Serene Gaile ORN, MD;  Location: [facility] OR;  Service: Vascular;  Laterality: Left;   APPLICATION OF WOUND VAC Left 07/05/2022   Procedure: APPLICATION OF WOUND VAC;  Surgeon: Serene Gaile ORN, MD;  Location: [facility] OR;  Service: Vascular;  Laterality: Left;   BACK SURGERY  07/31/1992   cerv. spine fusion    BREAST ENHANCEMENT SURGERY     BREAST  SURGERY  04/01/1979   implants- bilateral    CATARACT EXTRACTION Left 12/2022   CERVICAL SPINE SURGERY  07/31/1990   fusion   ENDARTERECTOMY FEMORAL Left 06/02/2022   Procedure: LEFT ENDARTERECTOMY FEMORAL with BOVINE PATCH;  Surgeon: Serene Gaile ORN, MD;  Location: [facility] OR;  Service: Vascular;  Laterality: Left;   GROIN DEBRIDEMENT Left 07/05/2022   Procedure: LEFT GROIN WASHOUT WITH STIMULAN ANTIBIOTIC BEAD AND KERECIS FISH SKIN GRAFT APPLICATION;  Surgeon: Serene Gaile ORN, MD;  Location: [facility] OR;  Service: Vascular;  Laterality: Left;   INCISION AND DRAINAGE Left 07/05/2022   Procedure: LEFT GROIN WASHOUT;  Surgeon: Serene Gaile ORN, MD;  Location: [facility] OR;  Service: Vascular;  Laterality: Left;   JOINT REPLACEMENT Right 07/31/2005   LOOP RECORDER INSERTION N/A 08/15/2023   Procedure: LOOP RECORDER INSERTION;  Surgeon: Lesia Ozell Barter, PA-C;  Location: [facility] INVASIVE CV LAB;  Service: Cardiovascular;  Laterality: N/A;   LOWER EXTREMITY ANGIOGRAPHY N/A 01/26/2023   Procedure: Lower Extremity Angiography;  Surgeon: [name], MD;  Location: [facility] INVASIVE CV LAB;  Service: Cardiovascular;  Laterality: N/A;   LUMBAR LAMINECTOMY/DECOMPRESSION MICRODISCECTOMY Left 10/07/2015   Procedure: Lumbar three-five Decompressive lumbar Laminectomy & Left Lumbar four-five Microdiscectomy;  Surgeon: Lamar Peaches, MD;  Location: [facility] NEURO ORS;  Service: Neurosurgery;  Laterality: Left;   LUMBAR SPINE SURGERY  2016   [facility] ANGIOPLASTY Left 07/05/2022   Procedure: VEIN PATCH ANGIOPLASTY;  Surgeon: Serene Gaile ORN, MD;  Location: [facility] OR;  Service: Vascular;  Laterality: Left;   TONSILLECTOMY     TOTAL HIP ARTHROPLASTY  07/31/2005   Right   TUBAL LIGATION     Social History:  reports that she quit smoking about 14 years ago. Her smoking use included cigarettes. She started smoking about 39 years ago. She has a 16.8 pack-year smoking history. She quit smokeless tobacco use about 25 years ago. She reports  current alcohol use. She reports that she does not use drugs.  Allergies[1]  Family History  Problem Relation Age of Onset   Kidney cancer Mother    Lung cancer Sister    Heart disease Maternal Aunt    Heart disease Maternal Aunt    Kidney cancer Maternal Aunt    Kidney cancer Maternal Uncle     Prior to Admission medications  Medication Sig Start Date End Date Taking? Authorizing Provider  acetaminophen  (TYLENOL ) 325 MG tablet Take 650 mg by mouth daily as needed for moderate pain (pain score 4-6) or mild pain (pain score 1-3).    [provider]  albuterol  (PROVENTIL  HFA;VENTOLIN  HFA) 108 (90 Base) MCG/ACT inhaler Inhale 2 puffs into the lungs every 4 (four) hours as needed for wheezing or shortness of breath.    [provider]  amLODipine  (NORVASC ) 5 MG tablet Take 5 mg by mouth daily.    [provider]  apixaban  (ELIQUIS ) 5 MG TABS tablet Take 1 tablet (5 mg total) by mouth 2 (two) times  daily. 05/12/24   Fenton, Clint R, PA  bisoprolol  (ZEBETA ) 10 MG tablet Take 10 mg by mouth daily. 04/29/24   [provider]  BREZTRI  AEROSPHERE 160-9-4.8 MCG/ACT AERO Inhale 2 puffs into the lungs in the morning and at bedtime. 08/04/23   [provider]  buPROPion  (WELLBUTRIN  XL) 300 MG 24 hr tablet Take 300 mg by mouth daily. Patient taking differently: Take 450 mg by mouth daily.    [provider]  chlorthalidone  (HYGROTON ) 25 MG tablet Take 25 mg by mouth daily.    [provider]  DULoxetine  (CYMBALTA ) 20 MG capsule Take 20 mg by mouth daily.    [provider]  folic acid (FOLVITE) 1 MG tablet Take 1 mg by mouth daily.    [provider]  Golimumab  (SIMPONI  ARIA IV) Inject into the vein every 30 (thirty) days.    [provider]  HYDROXYCHLOROQUINE  SULFATE PO Take 200 mg by mouth daily.    [provider]  ipratropium (ATROVENT ) 0.06 % nasal spray USE 1 SPRAY(S) IN EACH NOSTRIL ONCE DAILY IN THE  MORNING 02/11/24   [name], [name], MD  Multiple Vitamins-Minerals (MULTIVITAMIN WOMEN) TABS Take 2 tablets by mouth in the morning. Patient taking differently: Taking 2 gummie's by mouth daily    [provider]  olmesartan (BENICAR) 40 MG tablet Take 40 mg by mouth daily. 05/07/24   [provider]  pantoprazole  (PROTONIX ) 40 MG tablet Take 40 mg by mouth daily.    [provider]  rosuvastatin  (CRESTOR ) 5 MG tablet Take 5 mg by mouth in the morning. 04/11/22   [provider]  Vitamin D , Cholecalciferol , 25 MCG (1000 UT) CAPS 1 capsule. 05/08/24   [provider]    Physical Exam: Vitals:   07/26/24 1127 07/26/24 1132 07/26/24 1200 07/26/24 1345  BP:  115/76 (!) 84/60 110/60  Pulse:  97 (!) 102 87  Resp:  16 (!) 31 (!) 25  Temp:  98.9 F (37.2 C)    TempSrc:  Oral    SpO2:  99% 96% 97%  Height: 5' 6 (1.676 m)      Physical Exam Vitals and nursing note reviewed.  Constitutional:      Appearance: Normal appearance. She is ill-appearing.  HENT:     Head: Normocephalic.     Nose: No rhinorrhea.     Mouth/Throat:     Mouth: Mucous membranes are moist.  Eyes:     General: No scleral icterus.    Pupils: Pupils are equal, round, and reactive to light.  Cardiovascular:     Rate and Rhythm: Normal rate and regular rhythm.  Pulmonary:     Effort: Pulmonary effort is normal.     Breath sounds: Normal breath sounds. No wheezing, rhonchi or rales.  Abdominal:     General: Bowel sounds are normal. There is no distension.     Palpations: Abdomen is soft.     Tenderness: There is no abdominal tenderness. There is no right CVA tenderness or left CVA tenderness.  Musculoskeletal:     Cervical back: Neck supple.     Right lower leg: No edema.     Left lower leg: No edema.  Skin:    General: Skin is warm and dry.  Neurological:     General: No focal deficit present.     Mental Status: She is alert and oriented to person, place, and time.   Psychiatric:        Mood and Affect:  Mood normal.        Behavior: Behavior normal.     Data Reviewed:  Results are pending, will review when available. 08/15/2023 transthoracic echocardiogram report. IMPRESSIONS:   1. Left ventricular ejection fraction, by estimation, is 60 to 65%. The  left ventricle has normal function. The left ventricle has no regional  wall motion abnormalities. Left ventricular diastolic parameters are  indeterminate.   2. Right ventricular systolic function is normal. The right ventricular  size is normal. There is normal pulmonary artery systolic pressure. The  estimated right ventricular systolic pressure is 32.4 mmHg.   3. The mitral valve is normal in structure. No evidence of mitral valve  regurgitation. No evidence of mitral stenosis.   4. The aortic valve was not well visualized. Aortic valve regurgitation  is not visualized. No aortic stenosis is present.   5. The inferior vena cava is normal in size with greater than 50%  respiratory variability, suggesting right atrial pressure of 3 mmHg.   Assessment and Plan: Principal Problem:   Lactic acidosis In the setting of:   Sepsis due to pneumonia (HCC) Associated with exacerbation of:   COPD GOLD 2  Admit to PCU/inpatient. Continue supplemental oxygen . Scheduled and as needed bronchodilators. Continue IV fluids. Continue ceftriaxone  1 g IVPB daily. Continue azithromycin  500 mg IVPB daily. Check strep pneumoniae urinary antigen. Check sputum Gram stain, culture and sensitivity. Follow-up blood culture and sensitivity. Follow-up CBC and chemistry in the morning.  Active Problems:   AKI (acute kidney injury)  Continue IV fluids. Hold ARB/ACE. Avoid hypotension. Avoid nephrotoxins. Monitor intake and output. Monitor renal function electrolytes.    Hyponatremia Secondary to GI losses. Continue LR infusion. Follow sodium level in AM.    Transaminitis Secondary to dehydration.     Elevated pro brain natriuretic peptide (pro BNP) level Heart size is normal and the patient is dehydrated. Likely decreased renal clearance or lab error.    Rheumatoid arthritis (HCC)  Continue hydroxychloroquine  200 mg p.o. daily.    Primary hypertension Hold antihypertensives today. May resume in a.m. depending on her BP measurements    PAD (peripheral artery disease) Continue statin and DOAC.    Gastroesophageal reflux disease Continue home PPI as needed.     Advance Care Planning:   Code Status: Full Code   Consults:   Family Communication: Her husband was at bedside.  Severity of Illness: The appropriate patient status for this patient is INPATIENT. Inpatient status is judged to be reasonable and necessary in order to provide the required intensity of service to ensure the patient's safety. The patient's presenting symptoms, physical exam findings, and initial radiographic and laboratory data in the context of their chronic comorbidities is felt to place them at high risk for further clinical deterioration. Furthermore, it is not anticipated that the patient will be medically stable for discharge from the hospital within 2 midnights of admission.   * I certify that at the point of admission it is my clinical judgment that the patient will require inpatient hospital care spanning beyond 2 midnights from the point of admission due to high intensity of service, high risk for further deterioration and high frequency of surveillance required.*  Author: Alm Dorn Castor, MD 07/26/2024 1:59 PM  For on call review [email or web address].   This document was prepared using Dragon voice recognition software and may contain some unintended transcription errors.     [1]  Allergies Allergen Reactions   Thorazine [Chlorpromazine] Anaphylaxis   "

## 2024-07-26 NOTE — Progress Notes (Signed)
 Elink following for sepsis protocol.

## 2024-07-26 NOTE — Plan of Care (Signed)

## 2024-07-26 NOTE — Progress Notes (Addendum)
 Bedside RN, Glendell Aurora, affirmed that second lactic acid being drawn, will f/u w/ unit RN F/U w/ unit RN, she is putting in orders for lab draw for lactic acid

## 2024-07-26 NOTE — ED Triage Notes (Signed)
 Patient BIB from home Flu like symptoms x 5 days H/o COPD Ronchi lower lung bases Wearing 2 liters o2 on arrival, does not wear o2 at baseline + orthostatics Has not voided in 24 hours

## 2024-07-27 ENCOUNTER — Encounter (HOSPITAL_COMMUNITY): Payer: Self-pay

## 2024-07-27 DIAGNOSIS — J9601 Acute respiratory failure with hypoxia: Secondary | ICD-10-CM

## 2024-07-27 DIAGNOSIS — R748 Abnormal levels of other serum enzymes: Secondary | ICD-10-CM | POA: Insufficient documentation

## 2024-07-27 LAB — CBC
HCT: 32.7 % — ABNORMAL LOW (ref 36.0–46.0)
Hemoglobin: 11.3 g/dL — ABNORMAL LOW (ref 12.0–15.0)
MCH: 31.9 pg (ref 26.0–34.0)
MCHC: 34.6 g/dL (ref 30.0–36.0)
MCV: 92.4 fL (ref 80.0–100.0)
Platelets: 263 K/uL (ref 150–400)
RBC: 3.54 MIL/uL — ABNORMAL LOW (ref 3.87–5.11)
RDW: 13.2 % (ref 11.5–15.5)
WBC: 16.3 K/uL — ABNORMAL HIGH (ref 4.0–10.5)
nRBC: 0 % (ref 0.0–0.2)

## 2024-07-27 LAB — COMPREHENSIVE METABOLIC PANEL WITH GFR
ALT: 68 U/L — ABNORMAL HIGH (ref 0–44)
AST: 96 U/L — ABNORMAL HIGH (ref 15–41)
Albumin: 3.2 g/dL — ABNORMAL LOW (ref 3.5–5.0)
Alkaline Phosphatase: 90 U/L (ref 38–126)
Anion gap: 16 — ABNORMAL HIGH (ref 5–15)
BUN: 58 mg/dL — ABNORMAL HIGH (ref 8–23)
CO2: 22 mmol/L (ref 22–32)
Calcium: 8.4 mg/dL — ABNORMAL LOW (ref 8.9–10.3)
Chloride: 100 mmol/L (ref 98–111)
Creatinine, Ser: 2.25 mg/dL — ABNORMAL HIGH (ref 0.44–1.00)
GFR, Estimated: 22 mL/min — ABNORMAL LOW
Glucose, Bld: 114 mg/dL — ABNORMAL HIGH (ref 70–99)
Potassium: 3.4 mmol/L — ABNORMAL LOW (ref 3.5–5.1)
Sodium: 137 mmol/L (ref 135–145)
Total Bilirubin: 0.2 mg/dL (ref 0.0–1.2)
Total Protein: 6.5 g/dL (ref 6.5–8.1)

## 2024-07-27 LAB — CK: Total CK: 297 U/L — ABNORMAL HIGH (ref 38–234)

## 2024-07-27 LAB — MAGNESIUM: Magnesium: 1.3 mg/dL — ABNORMAL LOW (ref 1.7–2.4)

## 2024-07-27 LAB — PHOSPHORUS: Phosphorus: 2.9 mg/dL (ref 2.5–4.6)

## 2024-07-27 MED ORDER — IPRATROPIUM-ALBUTEROL 0.5-2.5 (3) MG/3ML IN SOLN
3.0000 mL | Freq: Four times a day (QID) | RESPIRATORY_TRACT | Status: DC
  Administered 2024-07-27 – 2024-07-31 (×9): 3 mL via RESPIRATORY_TRACT
  Filled 2024-07-27 (×13): qty 3

## 2024-07-27 MED ORDER — PANTOPRAZOLE SODIUM 40 MG PO TBEC
40.0000 mg | DELAYED_RELEASE_TABLET | Freq: Every day | ORAL | Status: DC
  Administered 2024-07-27 – 2024-07-31 (×5): 40 mg via ORAL
  Filled 2024-07-27 (×4): qty 1

## 2024-07-27 MED ORDER — MAGNESIUM SULFATE 2 GM/50ML IV SOLN
2.0000 g | Freq: Once | INTRAVENOUS | Status: AC
  Administered 2024-07-27: 2 g via INTRAVENOUS
  Filled 2024-07-27: qty 50

## 2024-07-27 MED ORDER — METHYLPREDNISOLONE SODIUM SUCC 125 MG IJ SOLR
125.0000 mg | Freq: Once | INTRAMUSCULAR | Status: AC
  Administered 2024-07-27: 125 mg via INTRAVENOUS
  Filled 2024-07-27 (×2): qty 2

## 2024-07-27 MED ORDER — SODIUM CHLORIDE 0.9 % IV SOLN
500.0000 mg | INTRAVENOUS | Status: DC
  Filled 2024-07-27: qty 5

## 2024-07-27 MED ORDER — AMLODIPINE BESYLATE 10 MG PO TABS
5.0000 mg | ORAL_TABLET | Freq: Every day | ORAL | Status: DC
  Administered 2024-07-27: 5 mg via ORAL
  Filled 2024-07-27: qty 1

## 2024-07-27 MED ORDER — AMLODIPINE BESYLATE 5 MG PO TABS
5.0000 mg | ORAL_TABLET | Freq: Every day | ORAL | Status: DC
  Administered 2024-07-28 – 2024-07-31 (×4): 5 mg via ORAL
  Filled 2024-07-27 (×3): qty 1

## 2024-07-27 MED ORDER — VITAMIN D (CHOLECALCIFEROL) 25 MCG (1000 UT) PO CAPS: 1.0000 | ORAL_CAPSULE | Freq: Every day | ORAL | Status: DC

## 2024-07-27 MED ORDER — POTASSIUM CHLORIDE CRYS ER 20 MEQ PO TBCR
60.0000 meq | EXTENDED_RELEASE_TABLET | Freq: Once | ORAL | Status: AC
  Administered 2024-07-27: 60 meq via ORAL
  Filled 2024-07-27: qty 3

## 2024-07-27 MED ORDER — DULOXETINE HCL 20 MG PO CPEP
20.0000 mg | ORAL_CAPSULE | Freq: Every day | ORAL | Status: DC
  Administered 2024-07-27 – 2024-07-31 (×5): 20 mg via ORAL
  Filled 2024-07-27 (×4): qty 1

## 2024-07-27 MED ORDER — PREDNISONE 50 MG PO TABS
50.0000 mg | ORAL_TABLET | Freq: Every day | ORAL | Status: DC
  Administered 2024-07-28 – 2024-07-31 (×4): 50 mg via ORAL
  Filled 2024-07-27 (×3): qty 1

## 2024-07-27 MED ORDER — IPRATROPIUM-ALBUTEROL 0.5-2.5 (3) MG/3ML IN SOLN
3.0000 mL | RESPIRATORY_TRACT | Status: DC | PRN
  Administered 2024-07-28 – 2024-07-29 (×2): 3 mL via RESPIRATORY_TRACT
  Filled 2024-07-27 (×13): qty 3

## 2024-07-27 MED ORDER — IPRATROPIUM-ALBUTEROL 0.5-2.5 (3) MG/3ML IN SOLN: 3.0000 mL | RESPIRATORY_TRACT | Status: DC | PRN

## 2024-07-27 MED ORDER — BUPROPION HCL ER (XL) 300 MG PO TB24
450.0000 mg | ORAL_TABLET | Freq: Every day | ORAL | Status: DC
  Administered 2024-07-27 – 2024-07-31 (×5): 450 mg via ORAL
  Filled 2024-07-27 (×4): qty 1

## 2024-07-27 MED ORDER — BENZONATATE 100 MG PO CAPS
100.0000 mg | ORAL_CAPSULE | Freq: Two times a day (BID) | ORAL | Status: DC | PRN
  Administered 2024-07-27: 100 mg via ORAL
  Filled 2024-07-27: qty 1

## 2024-07-27 MED ORDER — BENZONATATE 100 MG PO CAPS
200.0000 mg | ORAL_CAPSULE | Freq: Three times a day (TID) | ORAL | Status: DC | PRN
  Administered 2024-07-28 – 2024-07-29 (×4): 200 mg via ORAL
  Filled 2024-07-27 (×5): qty 2

## 2024-07-27 MED ORDER — BISOPROLOL FUMARATE 10 MG PO TABS
10.0000 mg | ORAL_TABLET | Freq: Every day | ORAL | Status: DC
  Administered 2024-07-27 – 2024-07-31 (×4): 10 mg via ORAL
  Filled 2024-07-27 (×3): qty 1
  Filled 2024-07-27: qty 2

## 2024-07-27 MED ORDER — SODIUM CHLORIDE 0.9 % IV SOLN
1.0000 g | INTRAVENOUS | Status: AC
  Administered 2024-07-28 – 2024-07-30 (×3): 1 g via INTRAVENOUS
  Filled 2024-07-27 (×3): qty 10

## 2024-07-27 MED ORDER — IPRATROPIUM-ALBUTEROL 0.5-2.5 (3) MG/3ML IN SOLN
3.0000 mL | Freq: Three times a day (TID) | RESPIRATORY_TRACT | Status: DC
  Administered 2024-07-27: 3 mL via RESPIRATORY_TRACT
  Filled 2024-07-27: qty 3

## 2024-07-27 MED ORDER — ROSUVASTATIN CALCIUM 5 MG PO TABS
5.0000 mg | ORAL_TABLET | Freq: Every day | ORAL | Status: DC
  Administered 2024-07-27: 5 mg via ORAL
  Filled 2024-07-27 (×2): qty 1

## 2024-07-27 MED ORDER — VITAMIN D 25 MCG (1000 UNIT) PO TABS
1000.0000 [IU] | ORAL_TABLET | Freq: Every day | ORAL | Status: DC
  Administered 2024-07-27 – 2024-07-31 (×5): 1000 [IU] via ORAL
  Filled 2024-07-27 (×4): qty 1

## 2024-07-27 MED ORDER — METHYLPREDNISOLONE SODIUM SUCC 125 MG IJ SOLR: 80.0000 mg | Freq: Once | INTRAMUSCULAR | Status: DC

## 2024-07-27 MED ORDER — BUDESON-GLYCOPYRROL-FORMOTEROL 160-9-4.8 MCG/ACT IN AERO
2.0000 | INHALATION_SPRAY | Freq: Two times a day (BID) | RESPIRATORY_TRACT | Status: DC
  Administered 2024-07-27 – 2024-07-31 (×9): 2 via RESPIRATORY_TRACT
  Filled 2024-07-27: qty 5.9

## 2024-07-27 MED ORDER — CHLORTHALIDONE 25 MG PO TABS
25.0000 mg | ORAL_TABLET | Freq: Every day | ORAL | Status: DC
  Filled 2024-07-27: qty 1

## 2024-07-27 NOTE — Assessment & Plan Note (Signed)
 Creatinine 3.5 on presentation with baseline creatinine around 1.3 Baseline GFR in the 50s Suspect likely secondary to prerenal etiology Cr recovered with IV fluids  Cr trend: 3.5 >> 2.25 >> 1.75 >> 1.33 (baseline) today Hold nephrotoxic agents & renally dose meds Monitor daily

## 2024-07-27 NOTE — Progress Notes (Signed)
 Pt seen for [facility] pre-admission screening. Pt's husband is present. I explained to Pt more about how medications work while on the program and Pt and husband verbalize understanding. Pt does not have nay pets, Pt's husband reports having firearms in the home that he will secure prior to our arrival. Pt declines any provided meals. Address and phone numbers currently in epic are correct for Pt and husband.   Myself and Faith, EMT assisted Pt to bathroom while there. Pt needed some assistance to remain steady but was able to ambulate to bathroom. Pt did become SOB with audible wheezing but seemed to recover after a few minutes of sitting down. Slight expiratory wheezes heard in upper fields upon auscultation with lower fields being diminished.  Pt's nurse is familiar with [facility] program and was reminded to not take out any IV's and that virtual RN Darice will be in touch when we are coming to get Pt. Pt still has IV antibiotics to do before we will transport.

## 2024-07-27 NOTE — Progress Notes (Addendum)
 " PROGRESS NOTE  Donna [name] FMW:[id number] DOB: 12/16/1945   PCP: Clarice Nottingham, MD  Patient is from: Home.  Reports using rollator at baseline.  DOA: 07/26/2024 LOS: 1  Chief complaints No chief complaint on file.    Brief Narrative / Interim history: 78 year old F with PMH of CAD, COPD, CKD-3, PVD, HTN and HLD presented to ED with shortness of breath, dry cough, wheezing, rhinorrhea, nasal congestion, sore throat, arthralgia, myalgia, diarrhea, fatigue and malaise for about 5 days, and admitted with working diagnosis of sepsis due to pneumonia and AKI.  In ED, stable vitals.  Saturating at 99% on 2 L.  WBC 20.4 with left shift.  Pro-Cal 1.58.  proBNP 3895 (normal for age under 1800).  Na 131.  Cr 3.57.  BUN 74.  AST 56.  ALT 49. Lactic acid 3.4.  VBG 7.31/38/37/19.  COVID-19, influenza and RSV PCR nonreactive. A- 20 pathogen RVP negative.  Portable CXR showed asymmetric soft tissue density in right lung apex, hyperexpansion with chronic interstitial coarsening.  CT chest ordered.  Received 2 L LR bolus and started on ceftriaxone , Zithromax , IV fluid and DuoNebs and admitted.  CT chest without contrast showed bilateral tree-in-bud nodules, bronchial wall thickening and scattered airspace disease with left basilar consolidation suggesting multifocal pneumonia, trace left pleural effusion, nodular opacities, emphysema and a stable thickening of distal esophagus with small hiatal hernia.  The next day, patient continues to endorse chest tightness and productive cough.  Evaluated by therapy who recommended home health.  Transferred to hospitalist at home service for further care.  Subjective: Seen and examined earlier this morning.  No major events overnight or this morning.  Continues to endorse productive cough and chest tightness.  She denies hemoptysis.  Denies nausea, vomiting or abdominal pain.  Denies UTI symptoms.   Assessment and plan: Severe sepsis due to multifocal pneumonia:  Present on arrival with tachycardia, tachypnea, leukocytosis, lactic acidosis, hypotension and AKI.  She presents with flulike symptoms for about 5 days.  COVID-19 and 20 pathogen RVP nonreactive.  CXR and CT chest without contrast raises concern for multifocal pneumonia.  Procalcitonin elevated to 1.58.  Blood cultures NGTD.  Lactic acid improved. - Continue ceftriaxone  and Zithromax  - I agree with Solu-Medrol  followed by p.o. prednisone  - Continue scheduled and as needed nebs - Continue mucolytics and antitussive  COPD with acute exacerbation due to the above -Antibiotics, steroid, nebs as above. -Wean oxygen  as able.   AKI: In the setting of poor p.o. intake, sepsis, hypotension and diarrhea.  Not on nephrotoxic meds.  Improved. Recent Labs    08/14/23 1216 08/15/23 0546 08/15/23 0551 05/26/24 1008 06/10/24 0839 07/26/24 1208 07/27/24 0434  BUN 49* 39*  --  36* 30* 74* 58*  CREATININE 1.84* 1.59* 1.63* 1.58* 1.32* 3.57* 2.25*  - Continue monitoring - Avoid nephrotoxic meds - Avoid hypotension.   Hypovolemic hyponatremia: Resolved. -Monitor   Transaminitis: Likely due to sepsis.  Mild.  CK in upper side of normal. -Continue monitoring   Elevated proBNP: Likely due to poor renal clearance.  Appears euvolemic on exam. -Hold off IV fluid.   Rheumatoid arthritis -Continue home Plaquenil  -Monitor CBC   Essential hypertension: Normotensive. -Continue home amlodipine  and bisoprolol .  Paroxysmal A-fib: Currently in sinus rhythm. - Continue home bisoprolol  and Eliquis . - Optimize electrolytes  Hypomagnesemia/hypokalemia -IV magnesium  sulfate 2 g x 1 -P.o. KCl 60 mill equivalents x 1   PAD -Continue home statin and Eliquis .   GERD - Continue Protonix   Physical  deconditioning - PT/OT recommended home health.  Body mass index is 26.47 kg/m.          DVT prophylaxis:   apixaban  (ELIQUIS ) tablet 5 mg  Code Status: Full code Family Communication: None at  bedside Level of care: Hospital at Home Med-Surg Status is: Inpatient Remains inpatient appropriate because: Severe sepsis, multifocal pneumonia, AKI   Final disposition: Home   55 minutes with more than 50% spent in reviewing records, counseling patient/family and coordinating care.  Consultants:  None  Procedures: None  Microbiology summarized: Blood cultures NGTD COVID-19 PCR negative A-20 pathogen RVP negative  Objective: Vitals:   07/27/24 0025 07/27/24 0452 07/27/24 0921 07/27/24 1342  BP: 115/61 119/60  113/67  Pulse: 96   93  Resp: 19 20  20   Temp: 98.5 F (36.9 C) 99.5 F (37.5 C)  98.5 F (36.9 C)  TempSrc: Oral Oral  Oral  SpO2: 97% 92% 96%   Weight:      Height:        Examination:  GENERAL: No apparent distress.  Nontoxic. HEENT: MMM.  Vision and hearing grossly intact.  NECK: Supple.  No apparent JVD.  RESP:  No IWOB.  Frequent cough.  Wheezing.  Rhonchorous. CVS:  RRR. Heart sounds normal.  ABD/GI/GU: BS+. Abd soft, NTND.  MSK/EXT:  Moves extremities. No apparent deformity. No edema.  SKIN: no apparent skin lesion or wound NEURO: AA.  Oriented appropriately.  No apparent focal neuro deficit. PSYCH: Calm. Normal affect.   Sch Meds:  Scheduled Meds:  [START ON 07/28/2024] amLODipine   5 mg Oral Daily   apixaban   5 mg Oral BID   bisoprolol   10 mg Oral Daily   budesonide -glycopyrrolate -formoterol   2 puff Inhalation BID   buPROPion   450 mg Oral Daily   cholecalciferol   1,000 Units Oral Daily   hydroxychloroquine   200 mg Oral Daily   ipratropium-albuterol   3 mL Nebulization Q6H   pantoprazole   40 mg Oral Daily   [START ON 07/28/2024] predniSONE   50 mg Oral Q breakfast   rosuvastatin   5 mg Oral Daily   Continuous Infusions:  [START ON 07/28/2024] azithromycin      [START ON 07/28/2024] cefTRIAXone  (ROCEPHIN )  IV     PRN Meds:.acetaminophen  **OR** [DISCONTINUED] acetaminophen , benzonatate , guaiFENesin -dextromethorphan , ipratropium-albuterol ,  loratadine , ondansetron  **OR** [DISCONTINUED] ondansetron  (ZOFRAN ) IV, mouth rinse  Antimicrobials: Anti-infectives (From admission, onward)    Start     Dose/Rate Route Frequency Ordered Stop   07/28/24 1000  azithromycin  (ZITHROMAX ) 500 mg in sodium chloride  0.9 % 250 mL IVPB        500 mg 250 mL/hr over 60 Minutes Intravenous Every 24 hours 07/27/24 1627 07/31/24 0959   07/28/24 1000  cefTRIAXone  (ROCEPHIN ) 1 g in sodium chloride  0.9 % 100 mL IVPB        1 g 200 mL/hr over 30 Minutes Intravenous Every 24 hours 07/27/24 1627 07/31/24 0959   07/27/24 1330  azithromycin  (ZITHROMAX ) 500 mg in sodium chloride  0.9 % 250 mL IVPB  Status:  Discontinued        500 mg 250 mL/hr over 60 Minutes Intravenous Every 24 hours 07/26/24 1404 07/27/24 1627   07/27/24 1200  cefTRIAXone  (ROCEPHIN ) 1 g in sodium chloride  0.9 % 100 mL IVPB  Status:  Discontinued        1 g 200 mL/hr over 30 Minutes Intravenous Every 24 hours 07/26/24 1404 07/27/24 1627   07/26/24 1800  hydroxychloroquine  (PLAQUENIL ) tablet 200 mg        200  mg Oral Daily 07/26/24 1712     07/26/24 1230  cefTRIAXone  (ROCEPHIN ) 2 g in sodium chloride  0.9 % 100 mL IVPB        2 g 200 mL/hr over 30 Minutes Intravenous Once 07/26/24 1228 07/26/24 1340   07/26/24 1230  azithromycin  (ZITHROMAX ) 500 mg in sodium chloride  0.9 % 250 mL IVPB        500 mg 250 mL/hr over 60 Minutes Intravenous  Once 07/26/24 1228 07/26/24 2003        I have personally reviewed the following labs and images: CBC: Recent Labs  Lab 07/26/24 1208 07/27/24 0434  WBC 20.4* 16.3*  NEUTROABS 16.5*  --   HGB 12.8 11.3*  HCT 38.8 32.7*  MCV 94.4 92.4  PLT 284 263   BMP &GFR Recent Labs  Lab 07/26/24 1208 07/27/24 0434  NA 131* 137  K 3.7 3.4*  CL 92* 100  CO2 17* 22  GLUCOSE 148* 114*  BUN 74* 58*  CREATININE 3.57* 2.25*  CALCIUM  8.9 8.4*  MG  --  1.3*  PHOS  --  2.9   Estimated Creatinine Clearance: 20.9 mL/min (A) (by C-G formula based on SCr of  2.25 mg/dL (H)). Liver & Pancreas: Recent Labs  Lab 07/26/24 1208 07/27/24 0434  AST 56* 96*  ALT 49* 68*  ALKPHOS 87 90  BILITOT 0.4 0.2  PROT 7.7 6.5  ALBUMIN  3.7 3.2*   No results for input(s): LIPASE, AMYLASE in the last 168 hours. No results for input(s): AMMONIA in the last 168 hours. Diabetic: No results for input(s): HGBA1C in the last 72 hours. No results for input(s): GLUCAP in the last 168 hours. Cardiac Enzymes: Recent Labs  Lab 07/27/24 0524  CKTOTAL 297*   Recent Labs    07/26/24 1208  PROBNP 3,895.0*   Coagulation Profile: No results for input(s): INR, PROTIME in the last 168 hours. Thyroid  Function Tests: No results for input(s): TSH, T4TOTAL, FREET4, T3FREE, THYROIDAB in the last 72 hours. Lipid Profile: No results for input(s): CHOL, HDL, LDLCALC, TRIG, CHOLHDL, LDLDIRECT in the last 72 hours. Anemia Panel: No results for input(s): VITAMINB12, FOLATE, FERRITIN, TIBC, IRON, RETICCTPCT in the last 72 hours. Urine analysis:    Component Value Date/Time   COLORURINE YELLOW 07/26/2024 1558   APPEARANCEUR HAZY (A) 07/26/2024 1558   LABSPEC 1.014 07/26/2024 1558   PHURINE 5.0 07/26/2024 1558   GLUCOSEU NEGATIVE 07/26/2024 1558   HGBUR SMALL (A) 07/26/2024 1558   BILIRUBINUR NEGATIVE 07/26/2024 1558   KETONESUR NEGATIVE 07/26/2024 1558   PROTEINUR 30 (A) 07/26/2024 1558   NITRITE NEGATIVE 07/26/2024 1558   LEUKOCYTESUR NEGATIVE 07/26/2024 1558   Sepsis Labs: Invalid input(s): PROCALCITONIN, LACTICIDVEN  Microbiology: Recent Results (from the past 240 hours)  Culture, blood (single)     Status: None (Preliminary result)   Collection Time: 07/26/24 12:23 PM   Specimen: BLOOD  Result Value Ref Range Status   Specimen Description   Final    BLOOD BLOOD RIGHT FOREARM Performed at [facility], [address], [place], [place] [address]    Special Requests   Final    BOTTLES  DRAWN AEROBIC ONLY Blood Culture results may not be optimal due to an inadequate volume of blood received in culture bottles Performed at [facility], [address], [place], [place] [address]    Culture   Final    NO GROWTH < 24 HOURS Performed at [facility], [address], [place], [place]  [address]    Report Status PENDING  Incomplete  Resp panel by RT-PCR (RSV, Flu A&B, Covid) Anterior Nasal Swab     Status: None   Collection Time: 07/26/24 12:24 PM   Specimen: Anterior Nasal Swab  Result Value Ref Range Status   SARS Coronavirus 2 by RT PCR NEGATIVE NEGATIVE Final    Comment: (NOTE) SARS-CoV-2 target nucleic acids are NOT DETECTED.  The SARS-CoV-2 RNA is generally detectable in upper respiratory specimens during the acute phase of infection. The lowest concentration of SARS-CoV-2 viral copies this assay can detect is 138 copies/mL. A negative result does not preclude SARS-Cov-2 infection and should not be used as the sole basis for treatment or other patient management decisions. A negative result may occur with  improper specimen collection/handling, submission of specimen other than nasopharyngeal swab, presence of viral mutation(s) within the areas targeted by this assay, and inadequate number of viral copies(<138 copies/mL). A negative result must be combined with clinical observations, patient history, and epidemiological information. The expected result is Negative.  Fact Sheet for Patients:  bloggercourse.com  Fact Sheet for Healthcare Providers:  seriousbroker.it  This test is no t yet approved or cleared by the [place]  FDA and  has been authorized for detection and/or diagnosis of SARS-CoV-2 by FDA under an Emergency Use Authorization (EUA). This EUA will remain  in effect (meaning this test can be used) for the duration of the COVID-19 declaration under Section 564(b)(1) of the  Act, 21 U.S.C.section 360bbb-3(b)(1), unless the authorization is terminated  or revoked sooner.       Influenza A by PCR NEGATIVE NEGATIVE Final   Influenza B by PCR NEGATIVE NEGATIVE Final    Comment: (NOTE) The Xpert Xpress SARS-CoV-2/FLU/RSV plus assay is intended as an aid in the diagnosis of influenza from Nasopharyngeal swab specimens and should not be used as a sole basis for treatment. Nasal washings and aspirates are unacceptable for Xpert Xpress SARS-CoV-2/FLU/RSV testing.  Fact Sheet for Patients: bloggercourse.com  Fact Sheet for Healthcare Providers: seriousbroker.it  This test is not yet approved or cleared by the [place]  FDA and has been authorized for detection and/or diagnosis of SARS-CoV-2 by FDA under an Emergency Use Authorization (EUA). This EUA will remain in effect (meaning this test can be used) for the duration of the COVID-19 declaration under Section 564(b)(1) of the Act, 21 U.S.C. section 360bbb-3(b)(1), unless the authorization is terminated or revoked.     Resp Syncytial Virus by PCR NEGATIVE NEGATIVE Final    Comment: (NOTE) Fact Sheet for Patients: bloggercourse.com  Fact Sheet for Healthcare Providers: seriousbroker.it  This test is not yet approved or cleared by the [place]  FDA and has been authorized for detection and/or diagnosis of SARS-CoV-2 by FDA under an Emergency Use Authorization (EUA). This EUA will remain in effect (meaning this test can be used) for the duration of the COVID-19 declaration under Section 564(b)(1) of the Act, 21 U.S.C. section 360bbb-3(b)(1), unless the authorization is terminated or revoked.  Performed at [facility], [address] [place], [place] [address]   Respiratory (~20 pathogens) panel by PCR     Status: None   Collection Time: 07/26/24  2:35 PM   Specimen:  Nasopharyngeal Swab; Respiratory  Result Value Ref Range Status   Adenovirus NOT DETECTED NOT DETECTED Final   Coronavirus 229E NOT DETECTED NOT DETECTED Final    Comment: (NOTE) The Coronavirus on the Respiratory Panel, DOES NOT test for the novel  Coronavirus (2019 nCoV)  Coronavirus HKU1 NOT DETECTED NOT DETECTED Final   Coronavirus NL63 NOT DETECTED NOT DETECTED Final   Coronavirus OC43 NOT DETECTED NOT DETECTED Final   Metapneumovirus NOT DETECTED NOT DETECTED Final   Rhinovirus / Enterovirus NOT DETECTED NOT DETECTED Final   Influenza A NOT DETECTED NOT DETECTED Final   Influenza B NOT DETECTED NOT DETECTED Final   Parainfluenza Virus 1 NOT DETECTED NOT DETECTED Final   Parainfluenza Virus 2 NOT DETECTED NOT DETECTED Final   Parainfluenza Virus 3 NOT DETECTED NOT DETECTED Final   Parainfluenza Virus 4 NOT DETECTED NOT DETECTED Final   Respiratory Syncytial Virus NOT DETECTED NOT DETECTED Final   Bordetella pertussis NOT DETECTED NOT DETECTED Final   Bordetella Parapertussis NOT DETECTED NOT DETECTED Final   Chlamydophila pneumoniae NOT DETECTED NOT DETECTED Final   Mycoplasma pneumoniae NOT DETECTED NOT DETECTED Final    Comment: Performed at [facility], [address], [place], [place] [address]    Radiology Studies: No results found.    Agustus Mane T. Hennie Gosa [facility]  If 7PM-7AM, please contact night-coverage [email or web address] 07/27/2024, 4:33 PM   "

## 2024-07-27 NOTE — Progress Notes (Signed)
 Patient transported to home spouse is present and current health up and data populating. Field staff at location. Successful virtual encounter RN explained program process, in addition to why medications like chorthalidone are being held related to renal function. Patient aware she will receive IV magnesium  in addition to her Abx treatment. Patient and family made aware of daily visits minimum 2 and they can contact virtual RN at anytime. Also if PRN meds are needed. Informed patient and spouse none of communication is recorded.  Patient. Spouse, and son allowed time for questions all questions answered to their satisfaction.

## 2024-07-27 NOTE — Progress Notes (Addendum)
 1946-Contact via phone-RN introduction, patient identifiers, overnight monitoring, medication administration plan and notification of Paramedic arrival completed.     2006-Video Call- Patient identifiers reviewed. Patient sitting in chair with Paramedic at the home, Medication administration assistance for self-administration completed for 1 PO medication with RN oversight. Patient denies pain or respiratory distress. Patient confirmed all questions answered. Overnight monitoring plan confirmed again. Patient  encouraged to contact [facility] as needed.   2041-Video Call- Patient identifiers reviewed. Patient sitting in chair with Paramedic and spouse.Medication administration assistance for self-administration completed for 1 PO medication with RN oversight.  Patient and spouse confirmed all questions answered. Overnight monitoring plan confirmed again. Patient and spouse encouraged to contact [facility] as needed.    2229--Contact call via outbound call, per spouse patient in bed. Review of medication currently due. Spouse agreed to inform the patient and complete a video call.    2236--Video call completed- Medication administration assistance completed with patient for 1 PO medication and inhaler. Patient and spouse updated and informed per providers notification; Crestor  medication will be held due to elevated CK lab results and can be further discussed during upcoming provider virtual visit. All questions answered. Plan for upcoming scheduled medication at 0200 and [facility] contact plan reviewed.      2340-Outbound call to address disconnected wearable. Unable to reach patient or spouse.     2356--Outbound call completed, spouse confirmed disconnection of current health senor due to misplacement. Current health senor readjusted, data now backfilling and is connected, no respiratory distress. Spouse confirmed patient is sleeping and will hit the I need help button or call for 0200 nedbulizer medication is needed.

## 2024-07-27 NOTE — Evaluation (Signed)
 Physical Therapy Evaluation Patient Details Name: Donna [name] MRN: [id number] DOB: Feb 22, 1946 Today's Date: 07/27/2024  History of Present Illness  78 yo female admitted with Pna, sepsis, lactic acidosis. Hx of CAD, facial basal cell Ca, CKD, COPD, PVD, COVID, depression, hiatal hernia, THA, RA, back surgery, dizziness.  Clinical Impression  On eval, pt was CGA-Min A for mobility. She ambulated ~65 feet x 2 with use of a rollator. O2 92% on RA, dyspnea with ambulation. She tolerated activity fairly well. Husband present during session-reports pt is moving much better on today;very weak over the last week. He feels comfortable caring for her at home. Encouraged pt to sit up as tolerated. Will plan to follow pt during this hospital stay.         If plan is discharge home, recommend the following: A little help with walking and/or transfers;A little help with bathing/dressing/bathroom;Assistance with cooking/housework;Assist for transportation;Help with stairs or ramp for entrance   Can travel by private vehicle        Equipment Recommendations None recommended by PT  Recommendations for Other Services       Functional Status Assessment Patient has had a recent decline in their functional status and demonstrates the ability to make significant improvements in function in a reasonable and predictable amount of time.     Precautions / Restrictions Precautions Precautions: Fall Precaution/Restrictions Comments: monitor O2 Restrictions Weight Bearing Restrictions Per Provider Order: No      Mobility  Bed Mobility Overal bed mobility: Needs Assistance Bed Mobility: Supine to Sit     Supine to sit: Min assist     General bed mobility comments: Husband provided 1 HHA for trunk to upright into long sitting. Pt able to swing LEs off bed. Increased time and effort for scooting to EOB. Cues provided as needed.    Transfers Overall transfer level: Needs assistance Equipment used:  Rollator (4 wheels) Transfers: Sit to/from Stand Sit to Stand: Contact guard assist           General transfer comment: Cues for safety, technqiue, hand/LE placement. Increased time and effort.    Ambulation/Gait Ambulation/Gait assistance: Contact guard assist Gait Distance (Feet): 65 Feet (x2) Assistive device: Rollator (4 wheels) Gait Pattern/deviations: Step-through pattern, Decreased stride length       General Gait Details: Pt ambulatd ~65 feet with rollator until she requested to sit and rest. Dyspnea with ambulation. O2 92% on RA. Cues for pursed lip breathing provided throughout session.  Stairs            Wheelchair Mobility     Tilt Bed    Modified Rankin (Stroke Patients Only)       Balance Overall balance assessment: Needs assistance         Standing balance support: Bilateral upper extremity supported, During functional activity, Reliant on assistive device for balance Standing balance-Leahy Scale: Poor                               Pertinent Vitals/Pain Pain Assessment Pain Assessment: No/denies pain    Home Living Family/patient expects to be discharged to:: Private residence Living Arrangements: Spouse/significant other Available Help at Discharge: Family;Available 24 hours/day Type of Home: House Home Access: Stairs to enter Entrance Stairs-Rails: Right Entrance Stairs-Number of Steps: 4   Home Layout: Two level;Able to live on main level with bedroom/bathroom Home Equipment: Cane - single point;Grab bars - tub/shower;Rolling Walker (2 wheels);Shower seat - built  in;Rollator (4 wheels)      Prior Function Prior Level of Function : Needs assist             Mobility Comments: mod ind with rollator vs cane ADLs Comments: mod ind (husband assisting most recently due to illness)     Extremity/Trunk Assessment   Upper Extremity Assessment Upper Extremity Assessment: Generalized weakness    Lower Extremity  Assessment Lower Extremity Assessment: Generalized weakness    Cervical / Trunk Assessment Cervical / Trunk Assessment: Normal  Communication   Communication Communication: No apparent difficulties    Cognition Arousal: Alert Behavior During Therapy: WFL for tasks assessed/performed   PT - Cognitive impairments: No apparent impairments                         Following commands: Intact       Cueing Cueing Techniques: Verbal cues     General Comments      Exercises     Assessment/Plan    PT Assessment Patient needs continued PT services  PT Problem List Decreased strength;Decreased activity tolerance;Decreased balance;Decreased mobility;Decreased knowledge of use of DME       PT Treatment Interventions DME instruction;Gait training;Therapeutic activities;Therapeutic exercise;Balance training;Patient/family education;Functional mobility training    PT Goals (Current goals can be found in the Care Plan section)  Acute Rehab PT Goals Patient Stated Goal: regain plof/independence PT Goal Formulation: With patient/family Time For Goal Achievement: 08/10/24 Potential to Achieve Goals: Good    Frequency Min 3X/week     Co-evaluation               AM-PAC PT 6 Clicks Mobility  Outcome Measure Help needed turning from your back to your side while in a flat bed without using bedrails?: A Little Help needed moving from lying on your back to sitting on the side of a flat bed without using bedrails?: A Little Help needed moving to and from a bed to a chair (including a wheelchair)?: A Little Help needed standing up from a chair using your arms (e.g., wheelchair or bedside chair)?: A Little Help needed to walk in hospital room?: A Little Help needed climbing 3-5 steps with a railing? : A Little 6 Click Score: 18    End of Session Equipment Utilized During Treatment: Gait belt Activity Tolerance: Patient tolerated treatment well Patient left: in  chair;with call bell/phone within reach;with chair alarm set;with family/visitor present   PT Visit Diagnosis: Muscle weakness (generalized) (M62.81);Difficulty in walking, not elsewhere classified (R26.2);Unsteadiness on feet (R26.81)    Time: 8981-8961 PT Time Calculation (min) (ACUTE ONLY): 20 min   Charges:   PT Evaluation $PT Eval Low Complexity: 1 Low   PT General Charges $$ ACUTE PT VISIT: 1 Visit           Dannial SQUIBB, PT Acute Rehabilitation  Office: [phone]

## 2024-07-27 NOTE — Assessment & Plan Note (Addendum)
 COPD Exacerbation  PNA  Decompensated respiratory status requiring 2 to 3 L nasal cannula in setting of increased work of breathing and transient hypoxia into the upper 80s Noted multifocal pneumonia on CT imaging with underlying emphysema/COPD Respiratory panel, COVID flu RSV negative. Will place on IV Rocephin  and azithromycin  for infectious coverage IV Solu-Medrol  DuoNebs Supplemental oxygen  as needed Incentive spirometry Flutter valve Otherwise monitor respiratory status closely

## 2024-07-27 NOTE — Care Management (Signed)
 Transition of Care [facility]) - Inpatient Brief Assessment   Patient Details  Name: Donna [name] MRN: [id number] Date of Birth: [birth year]  Transition of Care [facility]) CM/SW Contact:    Corean JAYSON Canary, RN Phone Number: 07/27/2024, 5:26 PM   Clinical Narrative:  Patient presented with [facility] history  of COPD, not on home oxygen  Patient transitioned to the Hospital at home program PT assessed and recommended home health, PING revealed a previous history with [facility] for home health last year IPCM will follow for needs  Transition of Care Asessment: Insurance and Status: Insurance coverage has been reviewed Patient has primary care physician: Yes Home environment has been reviewed: Lives with spouse Prior level of function:: Inependent Prior/Current Home Services: No current home services   Readmission risk has been reviewed: Yes Transition of care needs: transition of care needs identified, TOC will continue to follow

## 2024-07-27 NOTE — Progress Notes (Signed)
"  H@H  medic arrives at pts residence and was greeted at the door by the patients husband. Assessment of lung sounds reveal diminished sounds in bilateral bases. Meds were administered according to [facility] including Duoneb. Lung sounds after Duoneb treatment revealed same findings as before, however, pt states that she feels like shes able to breathe better and her cough is looser. Education was provided on use of flutter valve device. No other tasks needed at this time. Pt left in care of herself and family. H@H  visit complete. "

## 2024-07-27 NOTE — Assessment & Plan Note (Signed)
 Na 131>>137>>143>>139 (resolved)

## 2024-07-27 NOTE — Progress Notes (Signed)
 Steffan Daring EMTP and Gaylan Rattler EMT met pt in [place] room. EMT Rattler assisted in getting the patient dressed. Patient's husband present and left to make sure we could be let in. Medic and EMT loaded patient into [facility] vehicle for transport, secured via seatbelt. Able to ambulate with minimal assistance, alert and oriented x4 and normal color and appearance.   Patient transported to home without incident. Patient assisted up steps and into her home, which appeared safe and free of trip hazards. Patient stated they would primarily spend time on the first floor of their home either in their home office or bedroom. Patient placed in office chair and field team continued to set up equipment and perform medicine reconciliation. Confirmed held and resumed medicines with Virtual RN and attending physician. Home medications sequestered and placed in home office near current health modem and filing cabinet.   Patient had clear ventilation, overall diminished with expiratory wheezes. The patient was also hoarse. No difficulty breathing. Non-productive cough present.   Ivs flushed and patent. Blood return noted on right IV. Both Ivs curos capped.   EMT Rattler reviewed current health system, medications, and facilitated first virtual visit with nurse. Patient requested tylenol  at the end of the visit, and patient demonstrated understanding how to use the tablet and when to call the nurse to take medication. Tylenol  administered.   Patient informed [facility] crew would return this evening to provide magnesium  and duloxetine . Crew returned to hub.

## 2024-07-27 NOTE — Assessment & Plan Note (Signed)
 CK level in 290s  Will hold statin  IVF hydration  Monitor

## 2024-07-27 NOTE — Assessment & Plan Note (Signed)
 PPI

## 2024-07-27 NOTE — Plan of Care (Signed)
 " Hospital at Home Interim Note    Donna [name]   FMW:[id number]  DOB: [birth year]  DOA: 07/26/2024     1 Date of Service: 07/27/2024   Brief Narrative:  Donna [name] is a 78 y.o. female with medical history significant of osteoarthritis, CAD, facial basal cell carcinoma, stage III CKD, COPD, COVID-19, depression, GERD, hiatal hernia, hyperlipidemia, hypertension, peripheral vascular disease, seasonal allergies who presented to the emergency department complaints of mild dyspnea, wheezing, cough mostly nonproductive, sinus congestion, rhinorrhea, sore throat, arthralgias, myalgias, about 3 episodes of diarrhea, fatigue, malaise for the past 5 days and anuria for the past 24 hours.  No hemoptysis.  She thinks she got exposed to someone at church as they were several church members with URI symptoms.  No chest pain, palpitations, diaphoresis, PND, orthopnea or pitting edema of the lower extremities.  No abdominal pain, nausea, emesis, constipation, melena or hematochezia.  No flank pain, dysuria, frequency or hematuria.  No polyuria, polydipsia, polyphagia or blurred vision.    Lab work: CBC showed a white count of 20.4 with 80% neutrophils, hemoglobin 12.8 g/dL and platelets 715.  Lactic acid 3.4 mmol/L.  Venous gas showed a pH of 7.31, pCO2 38 and pO2 37 mmHg, acid-base deficit 6.6 and bicarbonate of 19.1 mmol/L.  Negative coronavirus, influenza and RSV PCR testing.  Procalcitonin 1.58 ng/mL.  proBNP 30 8095 pg/mL.  CMP showed a sodium 131, potassium 3.7, chloride 92 and CO2 17 mmol/L with an anion gap of 22.  AST was 56 and ALT 49 units/L with the rest of the hepatic functions within expected range.  Glucose 148, BUN 74, creatinine 3.57 and corrected calcium  9.1 mg/dL.   Imaging: Portable 1 view chest radiograph showing an asymmetric soft tissue density in the right lung apex medially.  CT chest without contrast recommended to further evaluate showing multifocal pneumonia, unchanged nodular opacities,  emphysema and thickened distal esophagus with small hiatal hernia.    ED course: Initial vital signs were temperature 98.9 F, pulse 97, respirations 16, BP 115/76 mmHg and O2 sat 99% on nasal cannula at 2 LPM.  The patient received 2000 mL of LR bolus followed by LR at 150 mL/h x 20 hours, a DuoNeb, ceftriaxone  1 g IVPB and azithromycin  500 mg IVPB.  After discussion at the bedside, both patient and husband are in full agreement with transitioning to the hospital at home program.    Subjective:  Mild improvement in cough and weakness. Still with mild increased WOB.   Hospital Problems Assessment and Plan: Acute respiratory failure with hypoxia (HCC) Decompensated respiratory status requiring 2 to 3 L nasal cannula in setting of increased work of breathing and transient hypoxia into the upper 80s Noted multifocal pneumonia on CT imaging with underlying emphysema/COPD Respiratory panel, COVID flu RSV negative. Will place on IV Rocephin  and azithromycin  for infectious coverage IV Solu-Medrol  DuoNebs Supplemental oxygen  as needed Incentive spirometry Flutter valve Otherwise monitor respiratory status closely  Acute kidney injury superimposed on chronic kidney disease Creatinine 3.5 on presentation with baseline creatinine around 1.3 Baseline GFR in the 50s Creatinine improving today with IV fluids at around 2.25 Suspect likely secondary to prerenal etiology Continue IV fluid hydration Hold nephrotoxic agents Renal ultrasound Monitor  Elevated CK CK level in 290s  Will hold statin  IVF hydration  Monitor    Hyponatremia Na 131-->137 (resolved)  Monitor    Gastroesophageal reflux disease PPI  Primary hypertension SBP in 100s  Holding BP regimen for now w/  IVF hydration  Titrate          Objective Vital signs were reviewed and unremarkable. Vitals:   07/27/24 0452 07/27/24 0921 07/27/24 1342 07/27/24 1705  BP: 119/60  113/67 115/72  Pulse:   93 87  Temp:  99.5 F (37.5 C)  98.5 F (36.9 C)   Resp: 20  20 18   Height:      Weight:    73.9 kg  SpO2: 92% 96%  92%  TempSrc: Oral  Oral   BMI (Calculated):    26.32    Exam Physical Exam Constitutional:      Appearance: She is normal weight.  HENT:     Head: Normocephalic and atraumatic.     Nose: Nose normal.     Mouth/Throat:     Mouth: Mucous membranes are moist.  Eyes:     Pupils: Pupils are equal, round, and reactive to light.  Cardiovascular:     Rate and Rhythm: Normal rate and regular rhythm.  Pulmonary:     Effort: Pulmonary effort is normal.     Breath sounds: Wheezing and rales present.  Abdominal:     General: Bowel sounds are normal.  Musculoskeletal:        General: Normal range of motion.     Cervical back: Normal range of motion.  Skin:    General: Skin is warm.  Neurological:     General: No focal deficit present.  Psychiatric:        Mood and Affect: Mood normal.      Labs / Other Information There are no new results to review at this time. CT Chest Wo Contrast CLINICAL DATA:  Pneumonia, complication suspected. Flu-like symptoms. History of COPD.  EXAM: CT CHEST WITHOUT CONTRAST  TECHNIQUE: Multidetector CT imaging of the chest was performed following the standard protocol without IV contrast.  RADIATION DOSE REDUCTION: This exam was performed according to the departmental dose-optimization program which includes automated exposure control, adjustment of the mA and/or kV according to patient size and/or use of iterative reconstruction technique.  COMPARISON:  02/27/2024.  FINDINGS: Cardiovascular: The heart is normal in size and there is a trace pericardial effusion. Multi-vessel coronary artery calcifications are noted. There is atherosclerotic calcification of the aorta without evidence of aneurysm. Pulmonary trunk is normal in caliber.  Mediastinum/Nodes: No mediastinal or axillary lymphadenopathy is seen. Evaluation of the hila is  limited due to lack of IV contrast. The thyroid  gland and trachea are within normal limits. There is thickening of the walls of the distal esophagus with likely small hiatal hernia, unchanged.  Lungs/Pleura: Paraseptal and centrilobular emphysematous changes are present in the lungs. Apical pleural and parenchymal scarring is noted bilaterally. There are multifocal tree-in-bud nodular opacities, bronchial wall thickening, and scattered airspace disease bilaterally. Consolidation is present in the left lower lobe. There is a trace left pleural effusion. No pneumothorax is seen. Previously described nodular opacities are unchanged.  Upper Abdomen: No acute abnormality.  Musculoskeletal: Bilateral breast implants are noted. Degenerative changes are present in the thoracic spine. No acute osseous abnormality is seen.  IMPRESSION: 1. Bilateral tree-in-bud nodules, bronchial wall thickening, and scattered airspace disease, with left basilar consolidation, suggesting multifocal pneumonia. 2. Trace left pleural effusion. 3. Previously described nodular opacities are unchanged. Three to six-month follow-up is recommended to document resolution of new tree-in-bud nodular opacities. 4. Emphysema. 5. Stable thickening of the walls of the distal esophagus with small hiatal hernia. 6. Coronary artery calcifications. 7. Aortic atherosclerosis.  Electronically  Signed   By: Leita Birmingham M.D.   On: 07/26/2024 16:06 DG Chest Port 1 View CLINICAL DATA:  Flu-like symptoms.  EXAM: PORTABLE CHEST 1 VIEW  COMPARISON:  08/15/2023  FINDINGS: Lungs are hyperexpanded. The lungs are clear without focal pneumonia, edema, pneumothorax or pleural effusion. Asymmetric soft tissue density noted in the right lung apex medially. Interstitial markings are diffusely coarsened with chronic features. The cardiopericardial silhouette is within normal limits for size. Telemetry leads overlie the  chest.  IMPRESSION: 1. Asymmetric soft tissue density in the right lung apex medially. CT chest without contrast recommended to further evaluate. 2. Hyperexpansion with chronic interstitial coarsening.  Electronically Signed   By: Camellia Candle M.D.   On: 07/26/2024 13:38   Lab Results  Component Value Date   WBC 16.3 (H) 07/27/2024   HGB 11.3 (L) 07/27/2024   HCT 32.7 (L) 07/27/2024   MCV 92.4 07/27/2024   PLT 263 07/27/2024   Last metabolic panel Lab Results  Component Value Date   GLUCOSE 114 (H) 07/27/2024   NA 137 07/27/2024   K 3.4 (L) 07/27/2024   CL 100 07/27/2024   CO2 22 07/27/2024   BUN 58 (H) 07/27/2024   CREATININE 2.25 (H) 07/27/2024   GFRNONAA 22 (L) 07/27/2024   CALCIUM  8.4 (L) 07/27/2024   PHOS 2.9 07/27/2024   PROT 6.5 07/27/2024   ALBUMIN  3.2 (L) 07/27/2024   LABGLOB 1.9 05/26/2024   AGRATIO 1.9 05/19/2022   BILITOT 0.2 07/27/2024   ALKPHOS 90 07/27/2024   AST 96 (H) 07/27/2024   ALT 68 (H) 07/27/2024   ANIONGAP 16 (H) 07/27/2024     Hospital at Home Admission Criteria Checklist:  Formal consent explained in detail and signed at the bedside: yes Patient meets inpatient admission criteria (see below for further details) yes Is pt Medicare FFS/[employer] Medicare-Medicaid, [employer], [employer] Medicare, [employer], [employer] ( required for initial launch with plan to expand)? yes Lives within 25 mil/ 30 min from [facility] within [place](pt may stay with family member during admission who lives within 25 miles or 30 min from [facility] w/in [facility])? yes Hemodynamically stable with relatively low risk of clinical deterioration-not requiring ICU? yes Age >55? yes Does not require frequent touch-points or complex interventions/medications (ie Titrated Infusions (IV insulin, heparin  drips, vasoactive drips, use of infused or injectable controlled substances, patients on insulin)? no Any Behavioral Health comorbidities likely to increase risk  for in-home care (ie Acute delirium or experiencing a marked altered mental status and cause is not a treatable condition in the home)? no Has the patient been on BIPAP during course of ED evaluation or hospitalization? no IF YES, Has the patient been off of BIPAP for >24 hours(If NO-THEN PATIENT DOES MEET INCLUSION CRITERIA)? no On Room Air or Needs oxygen  at home (<6L)? is on oxygen  at 2-3 L/min per nasal cannula. Active safety concerns (ie Unable to use bedside commode independently and lacks caregiver support for safety- needs SNF placement, unable to obtain IV access)? no Has skin check been performed? yes  Has Physical Therapy screened the patient? yes  Common admission diagnoses including: CAP, COPD Exacerbation, Acute on chronic heart failure, Cellulitis, UTI , dehydration, acute resp failure with hypoxia (requiring <5L)   Social Screening:  - Has the family been directly contacted about Hospital at Home program with consent obtained (if yes- please document who was spoken to with name and phone number)? yes  -Was the family approached about the use of [facility] pharmacy  for medications at discharge? no Denies significant ETOH intake? no Does not smoke and understands may not smoke in the presence of oxygen ? not applicable Patient states able to use iPad/phone for communication/has family who is able to use? yes Patient has agreed to be compliant with medication and treatment regimen of the program? yes Any active drug use in patient or primary caregiver including daily dosing of methadone? no Stable home environment ( access to appropriate heating in cold conditions and/or appropriate air conditioning in hot conditions and/or no running water/electricity)? yes  No aggressive pets at home? no Firearm present? yes  With ability or willingness to store them unloaded in a locked case for duration of hospitalization? yes Ambulatory? yes  mild difficulty Bed bugs present on home evaluation?  no Family support system in place? yes Patient feels safe at home and does not endorse any violence? yes Any actively decompensated behavioral health issues including agitation/aggressive behavior? no  Patient requests food to be provided by hospital home program? no PT/OT eval completed and not requiring SNF, ALF, inpatient rehab? yes  To be admitted to the Hospital at [facility] program, a patient generally must meet the following: 1. Requirement for Inpatient Level of Care: The patient's condition must necessitate an inpatient level of care. This is typically indicated by one or more of the following, depending on their specific diagnosis:  Persistent tachycardia despite appropriate treatment (e.g., for Heart Failure, UTI). Persistent tachypnea (rapid breathing) or dyspnea (shortness of breath) that hasn't improved sufficiently with observation care (e.g., for Heart Failure, Pneumonia, Viral Illness, COVID). Hypoxemia (low oxygen  levels), such as a new need for oxygen , an increased need from baseline, or specific oxygen  saturation levels (e.g., SpO2 <90-94% depending on the condition) that persist despite observation (e.g., for Heart Failure, COPD, Pneumonia, Viral Illness, COVID). Need for Intravenous (IV) hydration due to an inability to maintain oral hydration, which persists despite observation care (e.g., for Cellulitis, UTI, Viral Illness, COVID). Specific to Heart Failure: Persistent pulmonary edema, indicated by a new oxygen  need, lack of improvement with IV diuretics, and ongoing tachypnea/dyspnea. Specific to COPD: A decrease in known baseline resting oxygen  saturation (SpO2) by 4% or more, or an increase in pre-existing supplemental oxygen  requirements, which persists despite observation and requires continued close monitoring. Specific to Pneumonia: A Pneumonia Severity Index (PSI) class IV (moderate risk). Specific to Cellulitis: Failure of outpatient antibiotic therapy (indicated by  progression or no improvement after a minimum of 48 hours on an adequate regimen) or a clinical presentation (like acuity or rapidity of progression) that requires the intensity of monitoring found in an inpatient setting. Specific to UTI: Persistence or worsening of clinical findings like fever, pain, or dehydration despite observation care; presence of significant uropathy; suspected infection of an indwelling prosthetic device, stent, implant, or graft; or pregnancy with suspected pyelonephritis.  2. Appropriateness for Hospital at Home Setting: The patient's overall clinical picture, including the severity of their illness, their care needs, and their medical history and comorbidities, must be suitable for management in the Hospital at Home environment. This essentially means that none of the exclusion criteria (listed below) are met.  Unified Exclusion Criteria for Hospital at Home Admission: A patient would not be eligible for Hospital at Home if any of the following are present: Hemodynamic Instability: Hypotension (low blood pressure) is present. Respiratory Instability or Needs Beyond Program Capability: There is a new need for invasive or noninvasive ventilatory assistance (like BiPAP or a ventilator). Oxygenation is not sufficient, generally  indicated if an FiO2 (fraction of inspired oxygen ) of 45% (which is about 6 Liters/minute via nasal cannula) or more is required to keep oxygen  saturation (SpO2) at 90% or greater. Monitoring or Procedural Needs Beyond Program Capability: There is a need for invasive monitoring, such as a pulmonary artery catheter or an arterial line. There is a need for immediate-response telemetry monitoring (for dangerous arrhythmia detection and subsequent immediate intervention). The required medication regimen is beyond the capabilities of Hospital at Home (e.g., dosing intervals are too frequent for home administration). There is a need for a procedure that  cannot be performed by the Hospital at [facility] team (e.g., significant wound debridement or abscess drainage for cellulitis, or percutaneous nephrostomy for a complicated UTI). Significant Organ Dysfunction or Markers of Severe Illness: Mental status is not at baseline, or there is altered mental status suggestive of inadequate perfusion. Renal (kidney) function is unstable or showing an ongoing decline. There is evidence of inadequate perfusion, such as metabolic acidosis or myocardial ischemia. Uncompensated acidosis is present. Condition-Specific Severity or Complications Making Home Care Unsuitable: For Heart Failure: Known severe cardiac valvular disease (e.g., aortic stenosis, mitral regurgitation); or severe peripheral edema that impairs the ability to urinate or ambulate. For COPD: Known concurrent comorbidity or finding that indicates a higher-risk COPD exacerbation (e.g., pulmonary fibrosis, cavitation, pleural effusion, pneumothorax, rib fracture). For Pneumonia: Pneumonia Severity Index (PSI) class V (indicating high risk for inpatient mortality); known concurrent comorbidity or finding that indicates higher-risk pneumonia (e.g., pulmonary fibrosis, cavitation, large or loculated pleural effusion); or a concomitant serious infectious process like endocarditis or empyema. For Cellulitis: Orbital, periorbital, or necrotizing infection is suspected; or a concomitant serious infectious process like endocarditis, septic emboli, or septic joint space infection. For UTI: Urinary tract obstruction (e.g., kidney stone, bladder outlet obstruction); or a concomitant serious infectious process like endocarditis or septic emboli. For Viral Illness & COVID-19: A concomitant serious infectious process like endocarditis or empyema.  General Comorbidities or Status:  The patient is significantly immunosuppressed (this applies to Pneumonia, Cellulitis, UTI, Viral Illness, and COVID-19). The patient meets  inpatient admission criteria for a second diagnosis, or has care needs beyond the capabilities of Hospital at Home due to an active clinically significant comorbidity. (This is a general exclusion across all listed conditions)  Time spent: >60 min [facility] 07/27/2024, 3:00 PM   "

## 2024-07-27 NOTE — Progress Notes (Signed)
 This EMT and EMTP Delores went to room 1441 at [facility] to transport pt home for the H@H  program. Upon arrival in the room the pt was being assisted on getting dressed by her husband at the bedside. This EMT helped the pt's husband dress the pt for transport. The floor RN was made aware that this H@H  crew had arrived and was ready for the pt. The pt then ambulated to a WC. The pt's husband obtained all belongings in the room and packed them all away in personal bags. The pt was very happy to be going home and was grateful for this program. The pt was then wheeled out of the room and taken outside to the H@H  QRV. Once outside at the vehicle the pt was assisted out of the [facility] and into the QRV. The pt was then restrained via shoulder and lap belt. The pt was then transported home. No incidents to report during transport. Upon arrival to the home the pt was assisted out of the vehicle and into her home. There are 5 stairs to get into the home. The pt went up these stairs very well with assistance. The pt was worried that she wouldn't be able to get up the stairs. This H@H  crew helped to encourage the pt and give positive reinforcement. The pt was very proud of herself for tackling the stairs into the home. Once in the home the pt ambulated with assistance from her husband to her bedroom to put on clean clothes. This H@H  crew began to bring in H@H  equipment and start the set up process. After the pt changed she then ambulated with the help of her rolator into her office space where she asked for all equipment to be set up. The tablet, armband and all other H@H  equipment was then calibrated and explained to both the pt and her husband. The pt and her husband were also informed on how to troubleshoot all H@H  equipment. Both the pt and her husband verbalized understandings on how to use and troubleshoot all equipment. The pt is not currently on 02 but a concentrator was left at the home as a precaution. It was left assembled in the  pt's bedroom. A nebulizer machine was also left with the pt in her office space, assembled.  All vital signs were obtained on the pt and documented into the flowchart by LESLI Delores. Virtual RN Amen was contacted and all H@H  equipment was verified to be working and transmitting properly to current health. The pt's son and daughter in law arrived at the home. All H@H  information was explained to them in the even the pt or the husband had any concerns later this PM. The son and his significant other both verbalized understandings of all H@H  equipment. At this time EMTP Delores finished the medications and the rest of the admission.   The pt and her husband are very nice and are thrilled to be apart of this program. The rest of the family is also very supportive and takes very good care of the pt and her husband. All members were informed on how to reach the Virtual RN by PHONE at any hour of the day or night.

## 2024-07-27 NOTE — Progress Notes (Addendum)
 Spoke to patients floor RN repeating to cover PIVs, have ID bracelet on, and to not prepare any discharge tasks as we would tranfer patient and remove from the unit census. She verbalized  with guaze to protect. IV Abx running and Floor RN informed patient will be ready for transport at 1530. Spouse is also aware of time of transport.

## 2024-07-27 NOTE — Assessment & Plan Note (Signed)
 SBP in 100s  Holding BP regimen for now w/ IVF hydration  Titrate

## 2024-07-28 ENCOUNTER — Telehealth: Payer: Self-pay

## 2024-07-28 DIAGNOSIS — N189 Chronic kidney disease, unspecified: Secondary | ICD-10-CM

## 2024-07-28 DIAGNOSIS — J441 Chronic obstructive pulmonary disease with (acute) exacerbation: Secondary | ICD-10-CM

## 2024-07-28 DIAGNOSIS — I10 Essential (primary) hypertension: Secondary | ICD-10-CM

## 2024-07-28 DIAGNOSIS — R7401 Elevation of levels of liver transaminase levels: Secondary | ICD-10-CM

## 2024-07-28 DIAGNOSIS — I129 Hypertensive chronic kidney disease with stage 1 through stage 4 chronic kidney disease, or unspecified chronic kidney disease: Secondary | ICD-10-CM

## 2024-07-28 DIAGNOSIS — J9601 Acute respiratory failure with hypoxia: Secondary | ICD-10-CM

## 2024-07-28 DIAGNOSIS — N179 Acute kidney failure, unspecified: Secondary | ICD-10-CM

## 2024-07-28 DIAGNOSIS — R7989 Other specified abnormal findings of blood chemistry: Secondary | ICD-10-CM

## 2024-07-28 DIAGNOSIS — E872 Acidosis, unspecified: Secondary | ICD-10-CM

## 2024-07-28 LAB — COMPREHENSIVE METABOLIC PANEL WITH GFR
ALT: 88 U/L — ABNORMAL HIGH (ref 0–44)
AST: 74 U/L — ABNORMAL HIGH (ref 15–41)
Albumin: 3.6 g/dL (ref 3.5–5.0)
Alkaline Phosphatase: 93 U/L (ref 38–126)
Anion gap: 16 — ABNORMAL HIGH (ref 5–15)
BUN: 44 mg/dL — ABNORMAL HIGH (ref 8–23)
CO2: 23 mmol/L (ref 22–32)
Calcium: 8.6 mg/dL — ABNORMAL LOW (ref 8.9–10.3)
Chloride: 104 mmol/L (ref 98–111)
Creatinine, Ser: 1.75 mg/dL — ABNORMAL HIGH (ref 0.44–1.00)
GFR, Estimated: 29 mL/min — ABNORMAL LOW
Glucose, Bld: 126 mg/dL — ABNORMAL HIGH (ref 70–99)
Potassium: 4.3 mmol/L (ref 3.5–5.1)
Sodium: 143 mmol/L (ref 135–145)
Total Bilirubin: <0.2 mg/dL (ref 0.0–1.2)
Total Protein: 7.2 g/dL (ref 6.5–8.1)

## 2024-07-28 LAB — CBC
HCT: 35.1 % — ABNORMAL LOW (ref 36.0–46.0)
Hemoglobin: 11.6 g/dL — ABNORMAL LOW (ref 12.0–15.0)
MCH: 31.5 pg (ref 26.0–34.0)
MCHC: 33 g/dL (ref 30.0–36.0)
MCV: 95.4 fL (ref 80.0–100.0)
Platelets: 340 K/uL (ref 150–400)
RBC: 3.68 MIL/uL — ABNORMAL LOW (ref 3.87–5.11)
RDW: 13.7 % (ref 11.5–15.5)
WBC: 14.3 K/uL — ABNORMAL HIGH (ref 4.0–10.5)
nRBC: 0 % (ref 0.0–0.2)

## 2024-07-28 LAB — CK: Total CK: 266 U/L — ABNORMAL HIGH (ref 38–234)

## 2024-07-28 LAB — LACTIC ACID, PLASMA
Lactic Acid, Venous: 4.2 mmol/L (ref 0.5–1.9)
Lactic Acid, Venous: 3.9 mmol/L (ref 0.5–1.9)

## 2024-07-28 LAB — PHOSPHORUS: Phosphorus: 2.1 mg/dL — ABNORMAL LOW (ref 2.5–4.6)

## 2024-07-28 LAB — MAGNESIUM: Magnesium: 1.8 mg/dL (ref 1.7–2.4)

## 2024-07-28 MED ORDER — LACTATED RINGERS IV BOLUS
1000.0000 mL | Freq: Once | INTRAVENOUS | Status: AC
  Administered 2024-07-28: 1000 mL via INTRAVENOUS

## 2024-07-28 MED ORDER — AZITHROMYCIN 500 MG PO TABS
500.0000 mg | ORAL_TABLET | Freq: Every day | ORAL | Status: AC
  Administered 2024-07-28 – 2024-07-30 (×3): 500 mg via ORAL
  Filled 2024-07-28 (×3): qty 1

## 2024-07-28 MED ORDER — HYDROCOD POLI-CHLORPHE POLI ER 10-8 MG/5ML PO SUER
5.0000 mL | Freq: Two times a day (BID) | ORAL | Status: DC | PRN
  Administered 2024-07-29 – 2024-07-30 (×2): 5 mL via ORAL
  Filled 2024-07-28 (×7): qty 5

## 2024-07-28 MED ORDER — POLYETHYLENE GLYCOL 3350 17 G PO PACK
17.0000 g | PACK | Freq: Every day | ORAL | Status: DC | PRN
  Administered 2024-07-29: 17 g via ORAL
  Filled 2024-07-28 (×2): qty 1

## 2024-07-28 MED ORDER — SALINE SPRAY 0.65 % NA SOLN
1.0000 | NASAL | Status: DC | PRN
  Filled 2024-07-28: qty 44

## 2024-07-28 MED ORDER — HYDROCOD POLI-CHLORPHE POLI ER 10-8 MG/5ML PO SUER: 5.0000 mL | Freq: Two times a day (BID) | ORAL | 0 refills | Status: DC | PRN

## 2024-07-28 MED ORDER — SENNOSIDES-DOCUSATE SODIUM 8.6-50 MG PO TABS
1.0000 | ORAL_TABLET | Freq: Two times a day (BID) | ORAL | Status: DC | PRN
  Administered 2024-07-28: 1 via ORAL
  Filled 2024-07-28 (×3): qty 1

## 2024-07-28 NOTE — Progress Notes (Signed)
 Arrived to find the PT sitting upright in a desk chair. No obvious trauma or distress noted. PT's work of breathing is increased and her voice is hoarse with a productive cough. PT stated that she slept okay the prior night and is feeling better than she did yesterday. However, she is still coughing and c/o pain under her breast area that happens when she coughs. It is not a persistent pain.   Physical exam negative trauma to head, neck, or face. PERRLA. Negative JVD or tracheal deviation. Chest expansion is equal with labored breathing. Audible wheezing and rhonchi noted. L/S were noted to be wheezing and rhonchi in all lobes. PT has a productive cough with thick, green mucus. ABD is soft, non-tender. LBM was 07/25/24. PT denied any ABD pain. Negative pedal or peripheral edema noted.   All medications were administered as prescribed. IV Rocephin  was administered in right forearm IV. Additional duo-neb was administered prior to personnel leaving due to minimal improvement with initial breathing treatment. Lab draw was performed by EMT GEANNIE Pepper and brought back to [facility] hospital. IV in left forearm was removed due to occlusion. New dressing placed on right forearm with coban wrapped around the arm.   PT and husband denied having any other questions or concerns. They were reminded if they needed anything to reach out to the virtual RN.

## 2024-07-28 NOTE — Progress Notes (Signed)
 On virtual call with patient, spouse, field team, provider, and Tax Inspector.  Discussed abnormal labs, breathing work elevated,  completed neb treatment C/O accessory muscle pain r/t cough..  Use of fluttervalve with neb treatments.  Educated patient on fall risk with new order of tussinex for cough.  No dual signed meds.

## 2024-07-28 NOTE — Progress Notes (Signed)
 "  Hospital at Home Daily Progress Note   Patient: Donna [name]  MRN: [id number]  DOB: [birth year]  DOA: 07/26/2024  DOS: the patient was seen and examined on @TODAY @    Patient identified themself as Donna [name]  DOB 1945/11/19  Medic Richerd Batty present in the home during encounter and performed the assessment and physical exam. Patient was seen today via video conference; my physical location [place]   Brief hospital course:  Donna [name] is a 78 y.o. female with medical history significant of osteoarthritis, CAD, facial basal cell carcinoma, stage III CKD, COPD, COVID-19, depression, GERD, hiatal hernia, hyperlipidemia, hypertension, peripheral vascular disease, seasonal allergies who presented to the emergency department complaints of mild dyspnea, wheezing, cough mostly nonproductive, sinus congestion, rhinorrhea, sore throat, arthralgias, myalgias, about 3 episodes of diarrhea, fatigue, malaise for the past 5 days and anuria for the past 24 hours.  No hemoptysis.  She thinks she got exposed to someone at church as they were several church members with URI symptoms.  No chest pain, palpitations, diaphoresis, PND, orthopnea or pitting edema of the lower extremities.  No abdominal pain, nausea, emesis, constipation, melena or hematochezia.  No flank pain, dysuria, frequency or hematuria.  No polyuria, polydipsia, polyphagia or blurred vision.    Lab work: CBC showed a white count of 20.4 with 80% neutrophils, hemoglobin 12.8 g/dL and platelets 715.  Lactic acid 3.4 mmol/L.  Venous gas showed a pH of 7.31, pCO2 38 and pO2 37 mmHg, acid-base deficit 6.6 and bicarbonate of 19.1 mmol/L.  Negative coronavirus, influenza and RSV PCR testing.  Procalcitonin 1.58 ng/mL.  proBNP 30 8095 pg/mL.  CMP showed a sodium 131, potassium 3.7, chloride 92 and CO2 17 mmol/L with an anion gap of 22.  AST was 56 and ALT 49 units/L with the rest of the hepatic functions within expected range.  Glucose 148,  BUN 74, creatinine 3.57 and corrected calcium  9.1 mg/dL.   Imaging: Portable 1 view chest radiograph showing an asymmetric soft tissue density in the right lung apex medially.  CT chest without contrast recommended to further evaluate showing multifocal pneumonia, unchanged nodular opacities, emphysema and thickened distal esophagus with small hiatal hernia.    ED course: Initial vital signs were temperature 98.9 F, pulse 97, respirations 16, BP 115/76 mmHg and O2 sat 99% on nasal cannula at 2 LPM.  The patient received 2000 mL of LR bolus followed by LR at 150 mL/h x 20 hours, a DuoNeb, ceftriaxone  1 g IVPB and azithromycin  500 mg IVPB.  07/26/24 - admitted for Sepsis due to Pneumonia 07/27/24 - transferred to Hospital [facility] Home program.   07/28/24 - continues with significant cough, dyspnea improving.  Recurrent lactic acidosis in setting of ongoing respiratory issues, good urine output reported.   1 L bolus fluids.  Day 3 antibiotics.    Assessment and Plan:  * Sepsis due to pneumonia [facility]) As outlined  Acute respiratory failure with hypoxia (HCC) COPD Exacerbation  PNA  Decompensated respiratory status requiring 2 to 3 L nasal cannula in setting of increased work of breathing and transient hypoxia into the upper 80s Noted multifocal pneumonia on CT imaging with underlying emphysema/COPD Respiratory panel, COVID flu RSV negative. --Continue IV Rocephin , transition Zithromax  to PO -- day 3 abx --IV Solu-Medrol  initially >> PO prednisone  50 mg this morning --DuoNebs --Supplemental oxygen  as needed --Incentive spirometry --Flutter valve --Monitor respiratory status closely  Acute kidney injury superimposed on chronic kidney disease Creatinine 3.5 on  presentation with baseline creatinine around 1.3 Baseline GFR in the 50s Suspect likely secondary to prerenal etiology Cr improving with IV fluids  Cr trend: 3.5 >> 2.25 >> 1.75 today --Plan for 1 L bolus LR today given recurrent  lactic acidosis --Encourage PO hydration --Hold nephrotoxic agents --Monitor daily  Elevated CK CK level 297 >> 266 today despite prior hydration.   --Continue hold statin  --IVF hydration  --Trend CK   Lactic acidosis Initial lactate 3.4 on admission >> improved to 2.4 with IV fluids. Repeat lactate today is up at 4.2 - likely due to ongoing respiratory issues.  Clinically pt showing no signs of septic shock, is clinically improving otherwise. --Will give 1 L bolus LR  --Repeat lactate this evening & trend --Encourage PO hydration  Transaminitis Mild AST 56 >> 96 >> 74 today ALT 49 >> 68 >> 88 today Normal alk phos & bili No abdominal pain/N/V reported. Suspect due to sepsis --Trending LFT's to monitor  Hyponatremia Na 131>>137>>143 (resolved)  --Monitor    Gastroesophageal reflux disease --PPI  PAD (peripheral artery disease) Stable  Primary hypertension On home amlodipine  5 mg, bisoprolol  10 mg.   12/29 - AM bisoprolol  was held to avoid hypotension  --Monitor BP's and titrate regimen --Hold meds if MAP < 65   COPD GOLD 2  .     Patient Active Problem List   Diagnosis Date Noted   Acute respiratory failure with hypoxia (HCC) 07/27/2024    Priority: 1.   Acute kidney injury superimposed on chronic kidney disease 07/26/2024    Priority: 2.   Elevated CK 07/27/2024   Sepsis due to pneumonia (HCC) 07/26/2024   History of colonic polyps 07/26/2024   Lentigo 07/26/2024   Hyponatremia 07/26/2024   Transaminitis 07/26/2024   Lactic acidosis 07/26/2024   Elevated pro brain natriuretic peptide (pro BNP) level 07/26/2024   Benign neoplasm of skin of lower limb 07/23/2024   Early satiety 07/23/2024   History of malignant neoplasm of skin 07/23/2024   Gastroesophageal reflux disease 07/23/2024   Irritable bowel syndrome 07/23/2024   Internal hemorrhoids 07/23/2024   Nausea and vomiting 07/23/2024   Rectal bleeding 07/23/2024   Vertigo, benign positional  07/23/2024   Upper abdominal pain 07/23/2024   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/29/2023   Implantable loop recorder present 09/18/2023   TIA (transient ischemic attack) 08/14/2023   Solitary pulmonary nodule on lung CT 02/12/2023   Acquired thrombophilia 10/07/2022   Postoperative seroma involving circulatory system after other circulatory system procedure 07/05/2022   H/O left femoral artery endarterectomy 06/02/2022 06/05/2022   PAD (peripheral artery disease) 06/02/2022   Claudication    Peripheral artery disease    Lumbar post-laminectomy syndrome 04/24/2022   Lumbar radiculopathy 04/24/2022   Rheumatoid arthritis (HCC) 12/21/2021   Pain, joint, knee, left 08/05/2021   Pain of left hip joint 08/05/2021   Primary hypertension 12/25/2019   Hematoma of groin 04/03/2019   Ulcer of right foot, with fat layer exposed (HCC) 03/26/2019   Cellulitis of right foot 02/28/2019   Osteoarthritis of left knee 08/15/2017   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 She reports feeling better.  Breathing still not good, but improving.  Continues to have a bad cough.  Voice has gotten hoarse.  States she is started on cough up phlegm.  Having hot/cold spells throughout the day and night.  Does report more energy today than in a long time and appetite is coming back.       Admission Labs:   Admission Imaging Studies:   Significant Labs:   Significant Imaging Studies:   Antibiotic Therapy: Anti-infectives (From admission, onward)    Start     Dose/Rate Route Frequency Ordered Stop   07/28/24 1600  azithromycin  (ZITHROMAX ) 500 mg in sodium chloride  0.9 % 250 mL IVPB  Status:  Discontinued        500 mg 250 mL/hr over 60 Minutes Intravenous Every 24 hours 07/27/24 1627 07/28/24 0738   07/28/24 1000   cefTRIAXone  (ROCEPHIN ) 1 g in sodium chloride  0.9 % 100 mL IVPB        1 g 200 mL/hr over 30 Minutes Intravenous Every 24 hours 07/27/24 1627 07/31/24 0959   07/28/24 1000  azithromycin  (ZITHROMAX ) tablet 500 mg        500 mg Oral Daily 07/28/24 0738 07/31/24 0959   07/27/24 1330  azithromycin  (ZITHROMAX ) 500 mg in sodium chloride  0.9 % 250 mL IVPB  Status:  Discontinued        500 mg 250 mL/hr over 60 Minutes Intravenous Every 24 hours 07/26/24 1404 07/27/24 1627   07/27/24 1200  cefTRIAXone  (ROCEPHIN ) 1 g in sodium chloride  0.9 % 100 mL IVPB  Status:  Discontinued        1 g 200 mL/hr over 30 Minutes Intravenous Every 24 hours 07/26/24 1404 07/27/24 1627   07/26/24 1800  hydroxychloroquine  (PLAQUENIL ) tablet 200 mg        200 mg Oral Daily 07/26/24 1712     07/26/24 1230  cefTRIAXone  (ROCEPHIN ) 2 g in sodium chloride  0.9 % 100 mL IVPB        2 g 200 mL/hr over 30 Minutes Intravenous Once 07/26/24 1228 07/26/24 1340   07/26/24 1230  azithromycin  (ZITHROMAX ) 500 mg in sodium chloride  0.9 % 250 mL IVPB        500 mg 250 mL/hr over 60 Minutes Intravenous  Once 07/26/24 1228 07/26/24 2003       Procedures: none  Consultants: none          Physical Exam:    07/28/2024   11:08 AM 07/28/2024    6:00 AM 07/27/2024    5:05 PM  Vitals with BMI  Weight   163 lbs  BMI   26.32  Systolic 125  115  Diastolic 73  72  Pulse 91 87 87    Bedside physical exam was performed by RN or Medic listed above. Below exam findings are based on their in person physical exam findings and my observations during virtual encounter.   General exam: awake, alert, no acute distress HEENT: PERRLA, atraumatic, hearing grossly normal  Respiratory system: wheezing and rhonchi throughout all lung fields, productive sounding cough, normal respiratory effort. Cardiovascular system: normal S1/S2, RRR, no JVD no peripheral edema.   Gastrointestinal system: soft, NT, ND Central nervous system: A&O x 3.  no gross focal neurologic deficits, normal speech Extremities: moves all, no edema Psychiatry: normal mood, congruent affect, judgement and insight appear normal    Data Reviewed:  Notable labs --  Glucose 126 Cr improved 2.25 >> 1.75 BUN 58 >> 44 Ca 8.6 Phos 2.9>>2.1 Mg 1.3 >> 1.8 AST 96 >> 74 ALT 68 >> 88 CK 297>>266 Lactcic acid 2.4 >> 4.2 WBC 16.3 >> 14.2 Hbg  11.3 >> 11.6  Family Communication: none known to be present in the room during my virtual encounter.   Disposition: Status is: Inpatient Remains inpatient appropriate because: remains on IV antibiotics and IV fluids pending further clinical improvement and stability of lab abnormalities outlined above    Planned Discharge Destination: Home    Time spent: 45 minutes  Author: Burnard DELENA Cunning, DO [facility]  03/20/2024 7:00 AM  For on call review [email or web address].   "

## 2024-07-28 NOTE — Assessment & Plan Note (Signed)
 Initial lactate 3.4 on admission >> improved to 2.4 with IV fluids. Repeat lactate yesterday was increased up to 4.2 - likely due to ongoing respiratory issues.  Clinically pt showing no signs of septic shock, is clinically improving otherwise.  Given 1 L bolus LR as Cr still elevated. 12/30 Lactate today up further to 4.3 despite fluids Points to respiratory issues. --Trend  --Monitor clinical status, fever curve, hemodynamics 12/31: checking lactic acid today, lab in process

## 2024-07-28 NOTE — Assessment & Plan Note (Signed)
 Stable

## 2024-07-28 NOTE — Telephone Encounter (Signed)
"  ° °  Patient Name: Donna [name]  DOB: [birth year] MRN: [id number]  Primary Cardiologist: Gordy Bergamo, MD  Clinical pharmacists have reviewed the patient's past medical history, labs, and current medications as part of preoperative protocol coverage. The following recommendations have been made:  Patient will need to hold Eliquis  for 3 days for procedure. Due to elevated risk score with history of TIA, will route to Dr Inocencio for input   Per Dr. Inocencio 07/28/2024 Ok to hold OAC for procedure   I will route this recommendation to the requesting party via Epic fax function and remove from pre-op pool. Patient has VV scheduled 09/01/2024  Please call with questions.  Lamarr Satterfield, NP 07/28/2024, 2:41 PM  "

## 2024-07-28 NOTE — Progress Notes (Signed)
 Virtual call   After completing identification patient  Self-administered Robitussin cough worse this am. In addition prednisone  50mg , amlodopine 5mg , plaquinel 300mg ,  Earlier BP 109/66 held bisopropolol notified provider. No dual signed meds. Medics arrived.

## 2024-07-28 NOTE — Progress Notes (Addendum)
 Initiated LR bolus. PT is resting comfortably in bed. Per MD, need to wait 2 hours before a blood draw is performed. Will get labs around 1800.

## 2024-07-28 NOTE — TOC Progression Note (Signed)
 Transition of Care [facility]) - Progression Note    Patient Details  Name: Donna [name] MRN: [id number] Date of Birth: 02/04/1946  Transition of Care [facility]) CM/SW Contact  Debarah Saunas, RN Phone Number: 07/28/2024, 11:37 AM  Clinical Narrative:    RNCM following for discharge needs. Unnamed Hino J. Debarah, BSN,  RN, [employer] [phone]                    Expected Discharge Plan and Services                                               Social Drivers of Health (SDOH) Interventions SDOH Screenings   Food Insecurity: No Food Insecurity (07/26/2024)  Housing: Low Risk (07/26/2024)  Transportation Needs: No Transportation Needs (07/26/2024)  Utilities: Not At Risk (07/26/2024)  Social Connections: Unknown (07/26/2024)  Tobacco Use: Medium Risk (05/26/2024)    Readmission Risk Interventions    07/07/2022   12:41 PM  Readmission Risk Prevention Plan  Transportation Screening Complete  PCP or Specialist Appt within 5-7 Days Complete  Home Care Screening Complete  Medication Review (RN CM) Complete

## 2024-07-28 NOTE — Assessment & Plan Note (Signed)
 SABRA

## 2024-07-28 NOTE — Plan of Care (Signed)
" °  Problem: Clinical Measurements: Goal: Ability to maintain clinical measurements within normal limits will improve Outcome: Progressing Goal: Will remain free from infection Outcome: Progressing Goal: Diagnostic test results will improve Outcome: Progressing Goal: Respiratory complications will improve Outcome: Progressing Goal: Cardiovascular complication will be avoided Outcome: Progressing   Problem: Nutrition: Goal: Adequate nutrition will be maintained Outcome: Progressing   Problem: Elimination: Goal: Will not experience complications related to bowel motility Outcome: Progressing Goal: Will not experience complications related to urinary retention Outcome: Progressing   Problem: Pain Managment: Goal: General experience of comfort will improve and/or be controlled Outcome: Progressing   Problem: Safety: Goal: Ability to remain free from injury will improve Outcome: Progressing   Problem: Skin Integrity: Goal: Risk for impaired skin integrity will decrease Outcome: Progressing   Problem: Respiratory: Goal: Ability to maintain a clear airway will improve Outcome: Progressing   "

## 2024-07-28 NOTE — Progress Notes (Signed)
 Arrived to find the PT sitting upright at her desk. No obvious trauma noted, labored breathing and difficulty breathing noted. PT is Aox4. PT stated that she had a good nap but woke up drenched in sweat. Skin is warm to the touch, temperature was taken and found to be afebrile.   Physical Ax found no trauma noted to head, neck, or face. PT's face is red in appearance and warm to the touch; she stated that she had woken up from her nap not long before personnel arrived. PERRLA. Negative JVD or tracheal deviation. Chest expansion is equal with labored breathing. L/S were noted to be rhonchi, wheezing in the right lobes and rhonchi and diminished in the left lobes. PT has a productive, strong cough. PT is c/o of pain underneath her left breast when she coughs. She is rating it a 2/10 on the pain scale. ABD is soft, non-tender. PT denied having a  BM, a stool softener was administered. PT stated that she has been keeping up with her water intake and has been voiding regularly. Negative pedal or peripheral edema noted.   All vitals were obtained and documented. All medications were administered as prescribed. PRN Benzonate and Senna were administered. Blood draw was performed.   PT denied having any other questions or concerns. She was reminded that if she needed anything to call the virtual RN.

## 2024-07-28 NOTE — Progress Notes (Addendum)
 Spoke with Donna [name] reporting critical lab Lactic acid 4.2 provider and filed staff notified IV fluids ordered.

## 2024-07-28 NOTE — Assessment & Plan Note (Signed)
 Mild AST 56 >> 96 >> 74 today ALT 49 >> 68 >> 88 today Normal alk phos & bili No abdominal pain/N/V reported. Suspect due to sepsis --Trending LFT's to monitor

## 2024-07-28 NOTE — Progress Notes (Signed)
 [id number]--Outbound call for scheduled medication administration. The patients spouse reported and confirmed that the patient is currently sleeping and is not experiencing signs or symptoms of respiratory distress or discomfort and currently does not appear to need a scheduled nebulizer and would prefer to let patient sleep. Spouse encouraged to call [facility] for treatment if the patients respiratory condition changes.    RR 16  Sp02 96% RA  HR 89 per Current Health data as of 12/29 0209.

## 2024-07-28 NOTE — Hospital Course (Addendum)
 Donna [name] is a 78 y.o. female with medical history significant of osteoarthritis, CAD, facial basal cell carcinoma, stage III CKD, COPD, COVID-19, depression, GERD, hiatal hernia, hyperlipidemia, hypertension, peripheral vascular disease, seasonal allergies who presented to the emergency department complaints of mild dyspnea, wheezing, cough mostly nonproductive, sinus congestion, rhinorrhea, sore throat, arthralgias, myalgias, about 3 episodes of diarrhea, fatigue, malaise for the past 5 days and anuria for the past 24 hours.  No hemoptysis.  She thinks she got exposed to someone at church as they were several church members with URI symptoms.  No chest pain, palpitations, diaphoresis, PND, orthopnea or pitting edema of the lower extremities.  No abdominal pain, nausea, emesis, constipation, melena or hematochezia.  No flank pain, dysuria, frequency or hematuria.  No polyuria, polydipsia, polyphagia or blurred vision.    Lab work: CBC showed a white count of 20.4 with 80% neutrophils, hemoglobin 12.8 g/dL and platelets 715.  Lactic acid 3.4 mmol/L.  Venous gas showed a pH of 7.31, pCO2 38 and pO2 37 mmHg, acid-base deficit 6.6 and bicarbonate of 19.1 mmol/L.  Negative coronavirus, influenza and RSV PCR testing.  Procalcitonin 1.58 ng/mL.  proBNP 30 8095 pg/mL.  CMP showed a sodium 131, potassium 3.7, chloride 92 and CO2 17 mmol/L with an anion gap of 22.  AST was 56 and ALT 49 units/L with the rest of the hepatic functions within expected range.  Glucose 148, BUN 74, creatinine 3.57 and corrected calcium  9.1 mg/dL.   Imaging: Portable 1 view chest radiograph showing an asymmetric soft tissue density in the right lung apex medially.  CT chest without contrast recommended to further evaluate showing multifocal pneumonia, unchanged nodular opacities, emphysema and thickened distal esophagus with small hiatal hernia.    ED course: Initial vital signs were temperature 98.9 F, pulse 97, respirations 16, BP  115/76 mmHg and O2 sat 99% on nasal cannula at 2 LPM.  The patient received 2000 mL of LR bolus followed by LR at 150 mL/h x 20 hours, a DuoNeb, ceftriaxone  1 g IVPB and azithromycin  500 mg IVPB.  Significant Events: 07/26/24 - admitted for Sepsis due to Pneumonia 07/27/24 - transferred to [facility] program.   07/28/24 - continues with significant cough, dyspnea improving.  Recurrent lactic acidosis > 4. 1 L bolus fluids.   Day 3 antibiotics.  07/29/24 - Day 4 antibiotics.  Cr recovered to baseline.  Increased coughing overnight, proBNP significantly elevated.  20 mg IV lasix  x 1 ordered for this evening.    12/31: I assumed care of patient. Day 5 of abx. She reports that she is feeling better today.  She had no complaints and she slept well.  She denies fever, chills, nausea, vomiting, chest pain, shortness of breath.  Check serum magnesium  level, CBC, CMP, CK level, lactic acid.  Discussed during IDR.  07/31/24: Reviewed labs from 12/31. Noted low magnesium  and serum phosphorus levels. Will replete and recheck labs. DME Nebulizer ordered and duo-neb prescribed on discharge.

## 2024-07-28 NOTE — Telephone Encounter (Signed)
 ILR alert transmission review: AF Events  In recent days patient has experienced numerous AF events with RVR. AF burden is 58%.  She is currently in the hospital with sepsis and respiratory failure likely contributing to uptick in AF events. She is on OAC.   Reviewed with Charlies Arthur, PA-C rounding in the hospital today.  She would like to be sure patient has follow up appointment for her AF in a couple weeks post discharge. Recommends either with AF clinic or gen cards.   Patient has been followed by Daril Kicks, PA for AF.  Will flag for their awareness and assist with plans for follow up.

## 2024-07-28 NOTE — Assessment & Plan Note (Signed)
As outlined  

## 2024-07-29 ENCOUNTER — Encounter (HOSPITAL_COMMUNITY): Payer: Self-pay | Admitting: Family Medicine

## 2024-07-29 LAB — CBC
HCT: 34 % — ABNORMAL LOW (ref 36.0–46.0)
Hemoglobin: 11.3 g/dL — ABNORMAL LOW (ref 12.0–15.0)
MCH: 32 pg (ref 26.0–34.0)
MCHC: 33.2 g/dL (ref 30.0–36.0)
MCV: 96.3 fL (ref 80.0–100.0)
Platelets: 360 K/uL (ref 150–400)
RBC: 3.53 MIL/uL — ABNORMAL LOW (ref 3.87–5.11)
RDW: 14.1 % (ref 11.5–15.5)
WBC: 15.7 K/uL — ABNORMAL HIGH (ref 4.0–10.5)
nRBC: 0.3 % — ABNORMAL HIGH (ref 0.0–0.2)

## 2024-07-29 LAB — COMPREHENSIVE METABOLIC PANEL WITH GFR
ALT: 83 U/L — ABNORMAL HIGH (ref 0–44)
AST: 68 U/L — ABNORMAL HIGH (ref 15–41)
Albumin: 3.4 g/dL — ABNORMAL LOW (ref 3.5–5.0)
Alkaline Phosphatase: 75 U/L (ref 38–126)
Anion gap: 17 — ABNORMAL HIGH (ref 5–15)
BUN: 37 mg/dL — ABNORMAL HIGH (ref 8–23)
CO2: 21 mmol/L — ABNORMAL LOW (ref 22–32)
Calcium: 8.3 mg/dL — ABNORMAL LOW (ref 8.9–10.3)
Chloride: 101 mmol/L (ref 98–111)
Creatinine, Ser: 1.33 mg/dL — ABNORMAL HIGH (ref 0.44–1.00)
GFR, Estimated: 40 mL/min — ABNORMAL LOW
Glucose, Bld: 96 mg/dL (ref 70–99)
Potassium: 3.6 mmol/L (ref 3.5–5.1)
Sodium: 139 mmol/L (ref 135–145)
Total Bilirubin: <0.2 mg/dL (ref 0.0–1.2)
Total Protein: 6.6 g/dL (ref 6.5–8.1)

## 2024-07-29 LAB — PRO BRAIN NATRIURETIC PEPTIDE: Pro Brain Natriuretic Peptide: 6840 pg/mL — ABNORMAL HIGH

## 2024-07-29 LAB — MAGNESIUM: Magnesium: 1.5 mg/dL — ABNORMAL LOW (ref 1.7–2.4)

## 2024-07-29 LAB — CK: Total CK: 567 U/L — ABNORMAL HIGH (ref 38–234)

## 2024-07-29 LAB — LACTIC ACID, PLASMA: Lactic Acid, Venous: 4.3 mmol/L (ref 0.5–1.9)

## 2024-07-29 MED ORDER — FUROSEMIDE 10 MG/ML IJ SOLN
20.0000 mg | Freq: Once | INTRAMUSCULAR | Status: AC
  Administered 2024-07-29: 20 mg via INTRAVENOUS
  Filled 2024-07-29: qty 2

## 2024-07-29 MED ORDER — FLUTICASONE PROPIONATE 50 MCG/ACT NA SUSP
2.0000 | Freq: Every day | NASAL | Status: DC
  Administered 2024-07-29 – 2024-07-31 (×3): 2 via NASAL
  Filled 2024-07-29: qty 16

## 2024-07-29 MED ORDER — MAGNESIUM SULFATE 2 GM/50ML IV SOLN
2.0000 g | Freq: Once | INTRAVENOUS | Status: AC
  Administered 2024-07-29: 2 g via INTRAVENOUS
  Filled 2024-07-29: qty 50

## 2024-07-29 MED ORDER — LORATADINE 10 MG PO TABS
10.0000 mg | ORAL_TABLET | Freq: Every day | ORAL | Status: DC
  Administered 2024-07-29 – 2024-07-31 (×3): 10 mg via ORAL
  Filled 2024-07-29 (×4): qty 1

## 2024-07-29 MED ORDER — ALBUTEROL SULFATE (2.5 MG/3ML) 0.083% IN NEBU
2.5000 mg | INHALATION_SOLUTION | RESPIRATORY_TRACT | Status: DC | PRN
  Filled 2024-07-29 (×7): qty 3

## 2024-07-29 NOTE — Progress Notes (Signed)
 Pt 's vitals not transmitting on current health.Called pt via ipad and phone with no answer. Pt's husband Carlin reached by his phone. He states he just left home and pt has been comfortable and sleeping.He states his son is at the home with the patient.

## 2024-07-29 NOTE — Progress Notes (Signed)
 Completed virtual rounds with MD,paramedic at patient bedside. POC reviewed and discussed ,patient voices understanding and agreement. Pt reminded to call RN for any needs, RN and MD available at all times. Pt voices understanding. Pt aware of next planned visit and next call from RN.

## 2024-07-29 NOTE — Progress Notes (Signed)
 SABRA

## 2024-07-29 NOTE — Progress Notes (Signed)
 Video call completed with patient and her husband. Introduced myself as the CHARITY FUNDRAISER for the night and verbalized that I can be reached via tablet or phone call if issues arise overnight. Patient found sitting at table enjoying some chocolate cake. States that she is feeling better but still endorses some pain with cough. Patient opted to take a dose of as needed Tylenol  at this time. Discussed earliest time that patient can take next cough medicine and encouraged her to call RN if she would like a dose at that time. All questions answered before ending call.

## 2024-07-29 NOTE — Progress Notes (Signed)
 Pt's husband called, he is back home. [employer] is transmitting data. Pt stable.

## 2024-07-29 NOTE — Progress Notes (Signed)
 "   Hospital [facility] Home Daily Progress Note   Patient: Donna [name]  MRN: [id number]  DOB: May 16, 1946  DOA: 07/26/2024  DOS: the patient was seen and examined on @TODAY @    Patient identified themself as Donna [name]  DOB 07-25-1946  Medic Richerd Batty present in the home during encounter and performed the assessment and physical exam. Patient was seen today via video conference; my physical location [place]    Brief hospital course:  Donna [name] is a 78 y.o. female with medical history significant of osteoarthritis, CAD, facial basal cell carcinoma, stage III CKD, COPD, COVID-19, depression, GERD, hiatal hernia, hyperlipidemia, hypertension, peripheral vascular disease, seasonal allergies who presented to the emergency department complaints of mild dyspnea, wheezing, cough mostly nonproductive, sinus congestion, rhinorrhea, sore throat, arthralgias, myalgias, about 3 episodes of diarrhea, fatigue, malaise for the past 5 days and anuria for the past 24 hours.  No hemoptysis.  She thinks she got exposed to someone at church as they were several church members with URI symptoms.  No chest pain, palpitations, diaphoresis, PND, orthopnea or pitting edema of the lower extremities.  No abdominal pain, nausea, emesis, constipation, melena or hematochezia.  No flank pain, dysuria, frequency or hematuria.  No polyuria, polydipsia, polyphagia or blurred vision.    Lab work: CBC showed a white count of 20.4 with 80% neutrophils, hemoglobin 12.8 g/dL and platelets 715.  Lactic acid 3.4 mmol/L.  Venous gas showed a pH of 7.31, pCO2 38 and pO2 37 mmHg, acid-base deficit 6.6 and bicarbonate of 19.1 mmol/L.  Negative coronavirus, influenza and RSV PCR testing.  Procalcitonin 1.58 ng/mL.  proBNP 30 8095 pg/mL.  CMP showed a sodium 131, potassium 3.7, chloride 92 and CO2 17 mmol/L with an anion gap of 22.  AST was 56 and ALT 49 units/L with the rest of the hepatic functions within expected range.  Glucose 148,  BUN 74, creatinine 3.57 and corrected calcium  9.1 mg/dL.   Imaging: Portable 1 view chest radiograph showing an asymmetric soft tissue density in the right lung apex medially.  CT chest without contrast recommended to further evaluate showing multifocal pneumonia, unchanged nodular opacities, emphysema and thickened distal esophagus with small hiatal hernia.    ED course: Initial vital signs were temperature 98.9 F, pulse 97, respirations 16, BP 115/76 mmHg and O2 sat 99% on nasal cannula at 2 LPM.  The patient received 2000 mL of LR bolus followed by LR at 150 mL/h x 20 hours, a DuoNeb, ceftriaxone  1 g IVPB and azithromycin  500 mg IVPB.   Significant Events: 07/26/24 - admitted for Sepsis due to Pneumonia 07/27/24 - transferred to [facility] program.   07/28/24 - continues with significant cough, dyspnea improving.  Recurrent lactic acidosis > 4. 1 L bolus fluids.   Day 3 antibiotics.  07/29/24 - Day 4 antibiotics.  Cr recovered to baseline.  Increased coughing overnight, proBNP significantly elevated.  20 mg IV lasix  x 1 ordered for this evening.      Assessment and Plan:  * Sepsis due to pneumonia [facility]) As outlined  Acute respiratory failure with hypoxia (HCC) COPD Exacerbation  PNA  Decompensated respiratory status requiring 2 to 3 L nasal cannula in setting of increased work of breathing and transient hypoxia into the upper 80s Noted multifocal pneumonia on CT imaging with underlying emphysema/COPD Respiratory panel, COVID flu RSV negative. 12/29--12/30 -- O2 sats stable on RA --Continue IV Rocephin , transition Zithromax  to PO -- day 4 abx --Initially on  IV Solu-Medrol   --Now on PO prednisone  50 mg --DuoNebs --Supplemental oxygen  as needed --Incentive spirometry --Flutter valve --Monitor respiratory status closely  Acute kidney injury superimposed on chronic kidney disease Creatinine 3.5 on presentation with baseline creatinine around 1.3 Baseline GFR in the  50s Suspect likely secondary to prerenal etiology Cr recovered with IV fluids  Cr trend: 3.5 >> 2.25 >> 1.75 >> 1.33 (baseline) today --Hold nephrotoxic agents & renally dose meds --Monitor daily  Elevated CK CK level 297 >> 266 >> 567 today despite prior hydration including bolus yesterday.   --Continue hold statin  --Trend CK   Elevated pro brain natriuretic peptide (pro BNP) level proBNP almost doubled since admission post-IV hydration for significant AKI.  Pt with increased coughing overnight & today --20 mg IV Lasix  x 1 --Daily weights --Further diuresis PRN, reassess daily --Monitor renal function closely given just recovered AKI  Lactic acidosis Initial lactate 3.4 on admission >> improved to 2.4 with IV fluids. Repeat lactate yesterday was increased up to 4.2 - likely due to ongoing respiratory issues.  Clinically pt showing no signs of septic shock, is clinically improving otherwise.  Given 1 L bolus LR as Cr still elevated. 12/30 Lactate today up further to 4.3 despite fluids Points to respiratory issues. --Trend  --Monitor clinical status, fever curve, hemodynamics  Transaminitis Mild AST 56 >> 96 >> 74 >> 68 today ALT 49 >> 68 >> 88 >> 83 today Normal alk phos & bili No abdominal pain/N/V reported. Suspect due to sepsis --Trending LFT's to monitor  Hyponatremia Na 131>>137>>143>>139 (resolved)  --Monitor    Gastroesophageal reflux disease --PPI  PAD (peripheral artery disease) Stable  Primary hypertension On home amlodipine  5 mg, bisoprolol  10 mg.   12/30 - BP's stable  --Monitor BP's and titrate regimen --Hold meds if MAP < 65   COPD GOLD 2  .     Patient Active Problem List   Diagnosis Date Noted   Acute respiratory failure with hypoxia (HCC) 07/27/2024    Priority: 1.   Acute kidney injury superimposed on chronic kidney disease 07/26/2024    Priority: 2.   Elevated CK 07/27/2024   Sepsis due to pneumonia (HCC) 07/26/2024   History of  colonic polyps 07/26/2024   Lentigo 07/26/2024   Hyponatremia 07/26/2024   Transaminitis 07/26/2024   Lactic acidosis 07/26/2024   Elevated pro brain natriuretic peptide (pro BNP) level 07/26/2024   Benign neoplasm of skin of lower limb 07/23/2024   Early satiety 07/23/2024   History of malignant neoplasm of skin 07/23/2024   Gastroesophageal reflux disease 07/23/2024   Irritable bowel syndrome 07/23/2024   Internal hemorrhoids 07/23/2024   Nausea and vomiting 07/23/2024   Rectal bleeding 07/23/2024   Vertigo, benign positional 07/23/2024   Upper abdominal pain 07/23/2024   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/29/2023   Implantable loop recorder present 09/18/2023   TIA (transient ischemic attack) 08/14/2023   Solitary pulmonary nodule on lung CT 02/12/2023   Acquired thrombophilia 10/07/2022   Postoperative seroma involving circulatory system after other circulatory system procedure 07/05/2022   H/O left femoral artery endarterectomy 06/02/2022 06/05/2022   PAD (peripheral artery disease) 06/02/2022   Claudication    Peripheral artery disease    Lumbar post-laminectomy syndrome 04/24/2022   Lumbar radiculopathy 04/24/2022   Rheumatoid arthritis (HCC) 12/21/2021   Pain, joint, knee, left 08/05/2021   Pain of left hip joint 08/05/2021   Primary hypertension 12/25/2019   Hematoma of groin 04/03/2019   Ulcer of right  foot, with fat layer exposed (HCC) 03/26/2019   Cellulitis of right foot 02/28/2019   Osteoarthritis of left knee 08/15/2017   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 She reports up coughing for about 4 hours overnight, feeling little better this AM.  No fever, but reports sweating a lot.  Denies phlegm production.  Reports her weight is up despite not eating very well. Appetite  slowly improving.  Reports she's been drinking like crazy.  No BM yet.     Admission Labs: As above  Admission Imaging Studies: As above  Significant Labs: Cr trend - 3.57 >> 2.25 >> 1.75 >> 1.33 CK trend - 27 >> 266 >> 567 Lactate trend - 3.4 >> 2.4 >> 4.2 >> 3.9 >> 4.3 WBC trend - 20.4 >> 16.3 >> 14.3 >> 15.7 proBNP - 3895.0 >> 6840.0 Mg 1.3 >> 1.8 >> 1.5 AST 96 >> 74 >> 68 ALT 68 >> 88 >> 83  Significant Imaging Studies:   Antibiotic Therapy: Anti-infectives (From admission, onward)    Start     Dose/Rate Route Frequency Ordered Stop   07/28/24 1600  azithromycin  (ZITHROMAX ) 500 mg in sodium chloride  0.9 % 250 mL IVPB  Status:  Discontinued        500 mg 250 mL/hr over 60 Minutes Intravenous Every 24 hours 07/27/24 1627 07/28/24 0738   07/28/24 1000  cefTRIAXone  (ROCEPHIN ) 1 g in sodium chloride  0.9 % 100 mL IVPB        1 g 200 mL/hr over 30 Minutes Intravenous Every 24 hours 07/27/24 1627 07/31/24 0959   07/28/24 1000  azithromycin  (ZITHROMAX ) tablet 500 mg        500 mg Oral Daily 07/28/24 0738 07/31/24 0959   07/27/24 1330  azithromycin  (ZITHROMAX ) 500 mg in sodium chloride  0.9 % 250 mL IVPB  Status:  Discontinued        500 mg 250 mL/hr over 60 Minutes Intravenous Every 24 hours 07/26/24 1404 07/27/24 1627   07/27/24 1200  cefTRIAXone  (ROCEPHIN ) 1 g in sodium chloride  0.9 % 100 mL IVPB  Status:  Discontinued        1 g 200 mL/hr over 30 Minutes Intravenous Every 24 hours 07/26/24 1404 07/27/24 1627   07/26/24 1800  hydroxychloroquine  (PLAQUENIL ) tablet 200 mg        200 mg Oral Daily 07/26/24 1712     07/26/24 1230  cefTRIAXone  (ROCEPHIN ) 2 g in sodium chloride  0.9 % 100 mL IVPB        2 g 200 mL/hr over 30 Minutes Intravenous Once 07/26/24 1228 07/26/24 1340   07/26/24 1230  azithromycin  (ZITHROMAX ) 500 mg in sodium chloride  0.9 % 250 mL IVPB        500 mg 250 mL/hr over 60 Minutes Intravenous  Once 07/26/24 1228 07/26/24 2003        Procedures: none  Consultants: none          Physical Exam:    07/29/2024   11:59 AM 07/29/2024   11:00 AM 07/28/2024    6:34 PM  Vitals with BMI  Height  5' 6   Weight  172 lbs 14 oz   BMI  27.92   Systolic 132 132 874  Diastolic 75 75 78  Pulse  91 93  Bedside physical exam was performed by RN or Medic listed above. Below exam findings are based on their in person physical exam findings and my observations during virtual encounter.  General exam: awake, alert, no acute distress HEENT: hoarse sounding voice, hearing grossly normal  Respiratory system: lungs diminished with rhonchi throughout all fields, normal respiratory effort at rest on room air. Productive-sounding cough Cardiovascular system: normal S1/S2, RRR, no JVD no peripheral edema.  2+ symmetric radial pulses Gastrointestinal system: soft, NT, ND, hypoactive bowel sounds Central nervous system: A&O x 3. no gross focal neurologic deficits, normal speech Extremities: moves all, no edema Psychiatry: normal mood, congruent affect, judgement and insight appear normal    Data Reviewed: As reviewed above  Family Communication: none known to be present in the room during my virtual encounter.   Disposition: Status is: Inpatient Remains inpatient appropriate because: remains on IV antibiotics and IV fluids pending further clinical improvement and stability of lab abnormalities outlined above    Planned Discharge Destination: Home    Time spent: 55 minutes  Author: Burnard DELENA Cunning, DO [facility]  03/20/2024 7:00 AM  For on call review [email or web address].   "

## 2024-07-29 NOTE — Plan of Care (Signed)
" °  Problem: Education: Goal: Knowledge of General Education information will improve Description: Including pain rating scale, medication(s)/side effects and non-pharmacologic comfort measures Outcome: Progressing   Problem: Health Behavior/Discharge Planning: Goal: Ability to manage health-related needs will improve Outcome: Progressing   Problem: Clinical Measurements: Goal: Ability to maintain clinical measurements within normal limits will improve Outcome: Not Progressing   Problem: Activity: Goal: Risk for activity intolerance will decrease Outcome: Not Progressing   Problem: Nutrition: Goal: Adequate nutrition will be maintained Outcome: Progressing   Problem: Coping: Goal: Level of anxiety will decrease Outcome: Progressing   Problem: Elimination: Goal: Will not experience complications related to bowel motility Outcome: Progressing Goal: Will not experience complications related to urinary retention Outcome: Progressing   Problem: Pain Managment: Goal: General experience of comfort will improve and/or be controlled Outcome: Progressing   Problem: Safety: Goal: Ability to remain free from injury will improve Outcome: Progressing   Problem: Skin Integrity: Goal: Risk for impaired skin integrity will decrease Outcome: Progressing   Problem: Education: Goal: Knowledge of disease or condition will improve Outcome: Progressing Goal: Knowledge of the prescribed therapeutic regimen will improve Outcome: Progressing Goal: Individualized Educational Video(s) Outcome: Progressing   Problem: Activity: Goal: Ability to tolerate increased activity will improve Outcome: Not Progressing   Problem: Respiratory: Goal: Ability to maintain a clear airway will improve Outcome: Not Progressing Goal: Levels of oxygenation will improve Outcome: Progressing Goal: Ability to maintain adequate ventilation will improve Outcome: Progressing   "

## 2024-07-29 NOTE — Progress Notes (Signed)
 Virtual call with Mr and Mrs Purk. She is coughing uncontrollably. She is maintaining her o2 sat of 97% on room air but it is hard for her to catch her breath. She is not producing any mucous. Instructed how to use the nebulizer machine and she received a duoneb. She displays some relief. Administered PRN tessalon  pearls.

## 2024-07-29 NOTE — Assessment & Plan Note (Signed)
 proBNP almost doubled since admission post-IV hydration for significant AKI.  Pt with increased coughing overnight & today 20 mg IV Lasix  x 1 Daily weights Further diuresis PRN, reassess daily Monitor renal function closely given just recovered AKI

## 2024-07-30 LAB — CBC
HCT: 33 % — ABNORMAL LOW (ref 36.0–46.0)
Hemoglobin: 11.2 g/dL — ABNORMAL LOW (ref 12.0–15.0)
MCH: 32 pg (ref 26.0–34.0)
MCHC: 33.9 g/dL (ref 30.0–36.0)
MCV: 94.3 fL (ref 80.0–100.0)
Platelets: 336 K/uL (ref 150–400)
RBC: 3.5 MIL/uL — ABNORMAL LOW (ref 3.87–5.11)
RDW: 14.2 % (ref 11.5–15.5)
WBC: 15.7 K/uL — ABNORMAL HIGH (ref 4.0–10.5)
nRBC: 0 % (ref 0.0–0.2)

## 2024-07-30 LAB — COMPREHENSIVE METABOLIC PANEL WITH GFR
ALT: 67 U/L — ABNORMAL HIGH (ref 0–44)
AST: 60 U/L — ABNORMAL HIGH (ref 15–41)
Albumin: 3 g/dL — ABNORMAL LOW (ref 3.5–5.0)
Alkaline Phosphatase: 66 U/L (ref 38–126)
Anion gap: 15 (ref 5–15)
BUN: 34 mg/dL — ABNORMAL HIGH (ref 8–23)
CO2: 22 mmol/L (ref 22–32)
Calcium: 8.4 mg/dL — ABNORMAL LOW (ref 8.9–10.3)
Chloride: 103 mmol/L (ref 98–111)
Creatinine, Ser: 1.28 mg/dL — ABNORMAL HIGH (ref 0.44–1.00)
GFR, Estimated: 42 mL/min — ABNORMAL LOW
Glucose, Bld: 106 mg/dL — ABNORMAL HIGH (ref 70–99)
Potassium: 4.5 mmol/L (ref 3.5–5.1)
Sodium: 140 mmol/L (ref 135–145)
Total Bilirubin: 0.3 mg/dL (ref 0.0–1.2)
Total Protein: 6.3 g/dL — ABNORMAL LOW (ref 6.5–8.1)

## 2024-07-30 LAB — PHOSPHORUS: Phosphorus: 2.3 mg/dL — ABNORMAL LOW (ref 2.5–4.6)

## 2024-07-30 LAB — CK: Total CK: 484 U/L — ABNORMAL HIGH (ref 38–234)

## 2024-07-30 LAB — LACTIC ACID, PLASMA: Lactic Acid, Venous: 2.5 mmol/L (ref 0.5–1.9)

## 2024-07-30 LAB — MAGNESIUM: Magnesium: 1.6 mg/dL — ABNORMAL LOW (ref 1.7–2.4)

## 2024-07-30 MED ORDER — MAGNESIUM SULFATE 2 GM/50ML IV SOLN
2.0000 g | Freq: Once | INTRAVENOUS | Status: DC
  Filled 2024-07-30: qty 50

## 2024-07-30 MED ORDER — POTASSIUM & SODIUM PHOSPHATES 280-160-250 MG PO PACK
1.0000 | PACK | Freq: Three times a day (TID) | ORAL | Status: DC
  Filled 2024-07-30 (×2): qty 1

## 2024-07-30 MED ORDER — HYDROCOD POLI-CHLORPHE POLI ER 10-8 MG/5ML PO SUER: 5.0000 mL | Freq: Two times a day (BID) | ORAL | 0 refills | Status: AC | PRN

## 2024-07-30 MED ORDER — MAGNESIUM SULFATE 2 GM/50ML IV SOLN
2.0000 g | Freq: Once | INTRAVENOUS | Status: AC
  Administered 2024-07-31: 2 g via INTRAVENOUS
  Filled 2024-07-30: qty 50

## 2024-07-30 MED ORDER — POTASSIUM & SODIUM PHOSPHATES 280-160-250 MG PO PACK
1.0000 | PACK | Freq: Three times a day (TID) | ORAL | Status: DC
  Administered 2024-07-31: 1 via ORAL
  Filled 2024-07-30 (×2): qty 1

## 2024-07-30 NOTE — Progress Notes (Addendum)
 " PROGRESS NOTE - Telemedicine  Donna [name]  FMW:[id number] DOB: 1945-09-16 DOA: 07/26/2024 PCP: Clarice Nottingham, MD   Donna [name] is a 78 y.o. female with medical history significant of osteoarthritis, CAD, facial basal cell carcinoma, stage III CKD, COPD, COVID-19, depression, GERD, hiatal hernia, hyperlipidemia, hypertension, peripheral vascular disease, seasonal allergies who presented to the emergency department complaints of mild dyspnea, wheezing, cough mostly nonproductive, sinus congestion, rhinorrhea, sore throat, arthralgias, myalgias, about 3 episodes of diarrhea, fatigue, malaise for the past 5 days and anuria for the past 24 hours.  No hemoptysis.  She thinks she got exposed to someone at church as they were several church members with URI symptoms.  No chest pain, palpitations, diaphoresis, PND, orthopnea or pitting edema of the lower extremities.  No abdominal pain, nausea, emesis, constipation, melena or hematochezia.  No flank pain, dysuria, frequency or hematuria.  No polyuria, polydipsia, polyphagia or blurred vision.    Lab work: CBC showed a white count of 20.4 with 80% neutrophils, hemoglobin 12.8 g/dL and platelets 715.  Lactic acid 3.4 mmol/L.  Venous gas showed a pH of 7.31, pCO2 38 and pO2 37 mmHg, acid-base deficit 6.6 and bicarbonate of 19.1 mmol/L.  Negative coronavirus, influenza and RSV PCR testing.  Procalcitonin 1.58 ng/mL.  proBNP 30 8095 pg/mL.  CMP showed a sodium 131, potassium 3.7, chloride 92 and CO2 17 mmol/L with an anion gap of 22.  AST was 56 and ALT 49 units/L with the rest of the hepatic functions within expected range.  Glucose 148, BUN 74, creatinine 3.57 and corrected calcium  9.1 mg/dL.   Imaging: Portable 1 view chest radiograph showing an asymmetric soft tissue density in the right lung apex medially.  CT chest without contrast recommended to further evaluate showing multifocal pneumonia, unchanged nodular opacities, emphysema and thickened distal esophagus  with small hiatal hernia.    ED course: Initial vital signs were temperature 98.9 F, pulse 97, respirations 16, BP 115/76 mmHg and O2 sat 99% on nasal cannula at 2 LPM.  The patient received 2000 mL of LR bolus followed by LR at 150 mL/h x 20 hours, a DuoNeb, ceftriaxone  1 g IVPB and azithromycin  500 mg IVPB.  Significant Events: 07/26/24 - admitted for Sepsis due to Pneumonia 07/27/24 - transferred to [facility] program.   07/28/24 - continues with significant cough, dyspnea improving.  Recurrent lactic acidosis > 4. 1 L bolus fluids.   Day 3 antibiotics.  07/29/24 - Day 4 antibiotics.  Cr recovered to baseline.  Increased coughing overnight, proBNP significantly elevated.  20 mg IV lasix  x 1 ordered for this evening.    12/31: I assumed care of patient. Day 5 of abx. She reports that she is feeling better today.  She had no complaints and she slept well.  She denies fever, chills, nausea, vomiting, chest pain, shortness of breath.  Check serum magnesium  level, CBC, CMP, CK level, lactic acid.  Discussed during IDR.  Assessment & Plan:   Principal Problem:   Sepsis due to pneumonia [facility]) Active Problems:   Acute respiratory failure with hypoxia (HCC)   Acute kidney injury superimposed on chronic kidney disease   COPD GOLD 2    Primary hypertension   PAD (peripheral artery disease)   Gastroesophageal reflux disease   Hyponatremia   Transaminitis   Lactic acidosis   Elevated pro brain natriuretic peptide (pro BNP) level   Elevated CK   Assessment and Plan:  * Sepsis due to pneumonia (HCC) As  outlined  Acute respiratory failure with hypoxia (HCC) COPD Exacerbation  PNA  Decompensated respiratory status requiring 2 to 3 L nasal cannula in setting of increased work of breathing and transient hypoxia into the upper 80s Noted multifocal pneumonia on CT imaging with underlying emphysema/COPD Respiratory panel, COVID flu RSV negative. 12/29--12/30 -- O2 sats stable on  RA Initially on IV Solu-Medrol   Now on PO prednisone  50 mg  DuoNebs, supplemental oxygen  as needed, IS, flutter valve Monitor respiratory status closely 12/31: Continue IV ceftriaxone  1 g IV dialy, Zithromax  to PO, complete day 5 abx  Acute kidney injury superimposed on chronic kidney disease Creatinine 3.5 on presentation with baseline creatinine around 1.3 Baseline GFR in the 50s Suspect likely secondary to prerenal etiology Cr recovered with IV fluids  Cr trend: 3.5 >> 2.25 >> 1.75 >> 1.33 (baseline) today Hold nephrotoxic agents & renally dose meds Monitor daily  Elevated CK CK level 297 >> 266 >> 567 today despite prior hydration including bolus yesterday.   Continue hold statin  Trend CK  Elevated pro brain natriuretic peptide (pro BNP) level proBNP almost doubled since admission post-IV hydration for significant AKI.  Pt with increased coughing overnight & today 20 mg IV Lasix  x 1 Daily weights Further diuresis PRN, reassess daily Monitor renal function closely given just recovered AKI  Lactic acidosis Initial lactate 3.4 on admission >> improved to 2.4 with IV fluids. Repeat lactate yesterday was increased up to 4.2 - likely due to ongoing respiratory issues.  Clinically pt showing no signs of septic shock, is clinically improving otherwise.  Given 1 L bolus LR as Cr still elevated. 12/30 Lactate today up further to 4.3 despite fluids Points to respiratory issues. --Trend  --Monitor clinical status, fever curve, hemodynamics 12/31: checking lactic acid today, lab in process  Transaminitis Mild AST 56 >> 96 >> 74 >> 68 today ALT 49 >> 68 >> 88 >> 83 today Normal alk phos & bili No abdominal pain/N/V reported. Suspect due to sepsis Trending LFT's to monitor  Hyponatremia Na 131>>137>>143>>139 (resolved)   Gastroesophageal reflux disease PPI  PAD (peripheral artery disease) Stable  Primary hypertension On home amlodipine  5 mg, bisoprolol  10 mg.   12/30  - BP's stable  Monitor BP's and titrate regimen Hold meds if MAP < 65   DVT prophylaxis: ambulation as tolerated  Code Status: full code Family Communication: no family present during telemedicine encounter Disposition Plan: pending clinical course Level of care: [facility] Med[facility]  Consultants:  TOC, PT, OT  Procedures:  none  Antimicrobials: 12/31: day 5 of abx   Subjective:  At bedside, via video telemedicine encounter, patient was able to tell me her first and last name, her date of birth.  She reports that she slept well.  She had no complaints.  We discussed that we are still monitoring and trending her labs to ensure appropriate improvement.  Objective: Vitals:   07/29/24 1100 07/29/24 1159 07/30/24 1419 07/30/24 1510  BP: 132/75 132/75  (!) 118/58  Pulse: 91  88 83  Resp: (!) 23  (!) 22 (!) 23  Temp: 97.9 F (36.6 C)     TempSrc: Oral     SpO2: 98%  93%   Weight: 78.4 kg  75.4 kg   Height: 5' 6 (1.676 m)  5' 6 (1.676 m)     Intake/Output Summary (Last 24 hours) at 07/30/2024 1702 Last data filed at 07/30/2024 1500 Gross per 24 hour  Intake 330 ml  Output --  Net 330 ml   Filed Weights   07/27/24 1705 07/29/24 1100 07/30/24 1419  Weight: 73.9 kg 78.4 kg 75.4 kg   Examination was completed with the assistance of: Victoria Huffman, paramedic, who was present in the house during the virtual encounter:  General exam: Appears calm and comfortable  Respiratory system: Clear to auscultation. Respiratory effort normal. Cardiovascular system: S1 & S2 heard, RRR. No murmurs. No pedal edema. Gastrointestinal system: Abdomen is nondistended, soft and nontender. No organomegaly or masses felt. Normal bowel sounds heard. Central nervous system: Alert and oriented. No focal neurological deficits. Extremities: Symmetric 5 x 5 power. Skin: No rashes, lesions or ulcers Psychiatry: Judgement and insight appear normal. Mood & affect appropriate.   Data  Reviewed: I have personally reviewed following labs and imaging studies  CBC: Recent Labs  Lab 07/26/24 1208 07/27/24 0434 07/28/24 1132 07/29/24 1208 07/30/24 1455  WBC 20.4* 16.3* 14.3* 15.7* 15.7*  NEUTROABS 16.5*  --   --   --   --   HGB 12.8 11.3* 11.6* 11.3* 11.2*  HCT 38.8 32.7* 35.1* 34.0* 33.0*  MCV 94.4 92.4 95.4 96.3 94.3  PLT 284 263 340 360 336   Basic Metabolic Panel: Recent Labs  Lab 07/26/24 1208 07/27/24 0434 07/28/24 1132 07/29/24 1208  NA 131* 137 143 139  K 3.7 3.4* 4.3 3.6  CL 92* 100 104 101  CO2 17* 22 23 21*  GLUCOSE 148* 114* 126* 96  BUN 74* 58* 44* 37*  CREATININE 3.57* 2.25* 1.75* 1.33*  CALCIUM  8.9 8.4* 8.6* 8.3*  MG  --  1.3* 1.8 1.5*  PHOS  --  2.9 2.1*  --    GFR: Estimated Creatinine Clearance: 35.6 mL/min (A) (by C-G formula based on SCr of 1.33 mg/dL (H)).  Liver Function Tests: Recent Labs  Lab 07/26/24 1208 07/27/24 0434 07/28/24 1132 07/29/24 1208  AST 56* 96* 74* 68*  ALT 49* 68* 88* 83*  ALKPHOS 87 90 93 75  BILITOT 0.4 0.2 <0.2 <0.2  PROT 7.7 6.5 7.2 6.6  ALBUMIN  3.7 3.2* 3.6 3.4*   Cardiac Enzymes: Recent Labs  Lab 07/27/24 0524 07/28/24 1132 07/29/24 1208  CKTOTAL 297* 266* 567*   BNP (last 3 results) Recent Labs    07/26/24 1208 07/29/24 1208  PROBNP 3,895.0* 6,840.0*   Sepsis Labs: Recent Labs  Lab 07/26/24 1208 07/26/24 1221 07/26/24 1703 07/28/24 1132 07/28/24 1850 07/29/24 1208  PROCALCITON 1.58  --   --   --   --   --   LATICACIDVEN  --    < > 2.4* 4.2* 3.9* 4.3*   < > = values in this interval not displayed.   Recent Results (from the past 240 hours)  Culture, blood (single)     Status: None (Preliminary result)   Collection Time: 07/26/24 12:23 PM   Specimen: BLOOD  Result Value Ref Range Status   Specimen Description   Final    BLOOD BLOOD RIGHT FOREARM Performed at [facility], [address], [place], [place] [address]    Special Requests   Final     BOTTLES DRAWN AEROBIC ONLY Blood Culture results may not be optimal due to an inadequate volume of blood received in culture bottles Performed at [facility], [address], [place], [place] [address]    Culture   Final    NO GROWTH 4 DAYS Performed at [facility], [address] [place], [place] [address]    Report Status  PENDING  Incomplete  Resp panel by RT-PCR (RSV, Flu A&B, Covid) Anterior Nasal Swab     Status: None   Collection Time: 07/26/24 12:24 PM   Specimen: Anterior Nasal Swab  Result Value Ref Range Status   SARS Coronavirus 2 by RT PCR NEGATIVE NEGATIVE Final    Comment: (NOTE) SARS-CoV-2 target nucleic acids are NOT DETECTED.  The SARS-CoV-2 RNA is generally detectable in upper respiratory specimens during the acute phase of infection. The lowest concentration of SARS-CoV-2 viral copies this assay can detect is 138 copies/mL. A negative result does not preclude SARS-Cov-2 infection and should not be used as the sole basis for treatment or other patient management decisions. A negative result may occur with  improper specimen collection/handling, submission of specimen other than nasopharyngeal swab, presence of viral mutation(s) within the areas targeted by this assay, and inadequate number of viral copies(<138 copies/mL). A negative result must be combined with clinical observations, patient history, and epidemiological information. The expected result is Negative.  Fact Sheet for Patients:  bloggercourse.com  Fact Sheet for Healthcare Providers:  seriousbroker.it  This test is no t yet approved or cleared by the [place]  FDA and  has been authorized for detection and/or diagnosis of SARS-CoV-2 by FDA under an Emergency Use Authorization (EUA). This EUA will remain  in effect (meaning this test can be used) for the duration of the COVID-19 declaration under Section 564(b)(1) of  the Act, 21 U.S.C.section 360bbb-3(b)(1), unless the authorization is terminated  or revoked sooner.       Influenza A by PCR NEGATIVE NEGATIVE Final   Influenza B by PCR NEGATIVE NEGATIVE Final    Comment: (NOTE) The Xpert Xpress SARS-CoV-2/FLU/RSV plus assay is intended as an aid in the diagnosis of influenza from Nasopharyngeal swab specimens and should not be used as a sole basis for treatment. Nasal washings and aspirates are unacceptable for Xpert Xpress SARS-CoV-2/FLU/RSV testing.  Fact Sheet for Patients: bloggercourse.com  Fact Sheet for Healthcare Providers: seriousbroker.it  This test is not yet approved or cleared by the [place]  FDA and has been authorized for detection and/or diagnosis of SARS-CoV-2 by FDA under an Emergency Use Authorization (EUA). This EUA will remain in effect (meaning this test can be used) for the duration of the COVID-19 declaration under Section 564(b)(1) of the Act, 21 U.S.C. section 360bbb-3(b)(1), unless the authorization is terminated or revoked.     Resp Syncytial Virus by PCR NEGATIVE NEGATIVE Final    Comment: (NOTE) Fact Sheet for Patients: bloggercourse.com  Fact Sheet for Healthcare Providers: seriousbroker.it  This test is not yet approved or cleared by the [place]  FDA and has been authorized for detection and/or diagnosis of SARS-CoV-2 by FDA under an Emergency Use Authorization (EUA). This EUA will remain in effect (meaning this test can be used) for the duration of the COVID-19 declaration under Section 564(b)(1) of the Act, 21 U.S.C. section 360bbb-3(b)(1), unless the authorization is terminated or revoked.  Performed at [facility], [address], [place], [place] [address]   Respiratory (~20 pathogens) panel by PCR     Status: None   Collection Time: 07/26/24  2:35 PM   Specimen:  Nasopharyngeal Swab; Respiratory  Result Value Ref Range Status   Adenovirus NOT DETECTED NOT DETECTED Final   Coronavirus 229E NOT DETECTED NOT DETECTED Final    Comment: (NOTE) The Coronavirus on the Respiratory Panel, DOES NOT test for the novel  Coronavirus (2019 nCoV)    Coronavirus HKU1 NOT  DETECTED NOT DETECTED Final   Coronavirus NL63 NOT DETECTED NOT DETECTED Final   Coronavirus OC43 NOT DETECTED NOT DETECTED Final   Metapneumovirus NOT DETECTED NOT DETECTED Final   Rhinovirus / Enterovirus NOT DETECTED NOT DETECTED Final   Influenza A NOT DETECTED NOT DETECTED Final   Influenza B NOT DETECTED NOT DETECTED Final   Parainfluenza Virus 1 NOT DETECTED NOT DETECTED Final   Parainfluenza Virus 2 NOT DETECTED NOT DETECTED Final   Parainfluenza Virus 3 NOT DETECTED NOT DETECTED Final   Parainfluenza Virus 4 NOT DETECTED NOT DETECTED Final   Respiratory Syncytial Virus NOT DETECTED NOT DETECTED Final   Bordetella pertussis NOT DETECTED NOT DETECTED Final   Bordetella Parapertussis NOT DETECTED NOT DETECTED Final   Chlamydophila pneumoniae NOT DETECTED NOT DETECTED Final   Mycoplasma pneumoniae NOT DETECTED NOT DETECTED Final    Comment: Performed at [facility], [address] [place], [place] [address]    Scheduled Meds:  amLODipine   5 mg Oral Daily   apixaban   5 mg Oral BID   bisoprolol   10 mg Oral Daily   budesonide -glycopyrrolate -formoterol   2 puff Inhalation BID   buPROPion   450 mg Oral Daily   cholecalciferol   1,000 Units Oral Daily   DULoxetine   20 mg Oral Daily   fluticasone   2 spray Each Nare Daily   hydroxychloroquine   200 mg Oral Daily   ipratropium-albuterol   3 mL Nebulization Q6H   loratadine   10 mg Oral Daily   pantoprazole   40 mg Oral Daily   predniSONE   50 mg Oral Q breakfast    LOS: 4 days   Time spent: 50 minutes  Dr. Sherre Location: [place]  [facility] If 7PM-7AM, please contact night-coverage 07/30/2024, 5:02 PM   "

## 2024-07-30 NOTE — Plan of Care (Signed)

## 2024-07-30 NOTE — Progress Notes (Signed)
 IMM letter signed on 07/30/24 @ 1515

## 2024-07-30 NOTE — Progress Notes (Signed)
 Pt seen for routine [facility] evening visit. Pt appears well and is sitting in chair in living room.  Vital signs and assessment obtained as noted, Pt's lung sounds reveal clear upper fields and diminished lower fields. Pt reports a productive cough with green/ yellow, thick sputum. Heart tones are s1, s2 and pulses are strong and irregular with hx of a-fib. Pt's abdomen is soft and non-tender with active bowel sounds. Pt is constipated and has not had a bm for 5 days. Pt does state she feels like she may have one tonight after eating. Pt denies any constant pain, but states she does have 8/10 pain while coughing that subsides as soon as she stops coughing.  Neb treatment administered as noted. IV care completed including new curos cap.   Pt encouraged to call with any problems or questions overnight.

## 2024-07-30 NOTE — Progress Notes (Signed)
 Arrived to find the PT lying down in bed, easily woken up by her husband. PT is alert and oriented to her baseline. PT stated that she has been sleeping and for the past several hours hasn't had a coughing fit. PT stated that when she does cough that she is getting a good amount of mucus production and spitting it out.   Physical exam showed negative trauma to the head, neck, or face. PERRLA. Negative JVD or tracheal deviation. Chest expansion is equal with slightly labored breathing. L/S were noted to be expiratory wheezing in the upper lobes and diminished/rhonchi in the lower lobes. Heart tones were noted to be consistent with a-fib. ABD is soft, non-tender. PT is passing gas. She has not had a bowel movement however stated that she is feeling that she will have one today due to the movement in her ABD being different. Negative pedal or peripheral edema noted.   Vitals were obtained and documented accordingly. PT stated that when she coughs she has pain in her sternum and rib cage that she rates an 8/10 on the pain scale. However, when she is not coughing she denied being in any pain. Weight was obtained and documented.   All medications were administered as prescribed. Blood work was obtained.   PT had virtual visit with DO Cox and discussed a possible d/c on 07/31/24 pending what her lab results were and how she was feeling. PT and PT's husband agreed with the plan. IMM letter was signed by the PT.   PT denied having any other questions or concerns. She was reminded that if she needed anything to call the virtual RN.

## 2024-07-30 NOTE — Progress Notes (Signed)
 Patient appears comfortable in the chair. Aox4. Productive cough noted. Denies SOB. Room air. Witnessed patient taking her scheduled AM meds over the video tablet. Plan of care reviewed with patient. Plan for paramedic visit between 1230-1330 today.

## 2024-07-30 NOTE — Plan of Care (Signed)
" °  Problem: Education: Goal: Knowledge of General Education information will improve Description: Including pain rating scale, medication(s)/side effects and non-pharmacologic comfort measures Outcome: Progressing   Problem: Health Behavior/Discharge Planning: Goal: Ability to manage health-related needs will improve Outcome: Progressing   Problem: Clinical Measurements: Goal: Ability to maintain clinical measurements within normal limits will improve Outcome: Progressing Goal: Respiratory complications will improve Outcome: Progressing   Problem: Activity: Goal: Risk for activity intolerance will decrease Outcome: Progressing   Problem: Nutrition: Goal: Adequate nutrition will be maintained Outcome: Progressing   Problem: Pain Managment: Goal: General experience of comfort will improve and/or be controlled Outcome: Progressing   Problem: Safety: Goal: Ability to remain free from injury will improve Outcome: Progressing   Problem: Skin Integrity: Goal: Risk for impaired skin integrity will decrease Outcome: Progressing   "

## 2024-07-31 LAB — CULTURE, BLOOD (SINGLE): Culture: NO GROWTH

## 2024-07-31 LAB — PHOSPHORUS: Phosphorus: 2.4 mg/dL — ABNORMAL LOW (ref 2.5–4.6)

## 2024-07-31 LAB — MAGNESIUM: Magnesium: 2.3 mg/dL (ref 1.7–2.4)

## 2024-07-31 MED ORDER — BENZONATATE 200 MG PO CAPS: 200.0000 mg | ORAL_CAPSULE | Freq: Three times a day (TID) | ORAL | 0 refills | Status: AC | PRN

## 2024-07-31 MED ORDER — IPRATROPIUM-ALBUTEROL 0.5-2.5 (3) MG/3ML IN SOLN: 3.0000 mL | Freq: Four times a day (QID) | RESPIRATORY_TRACT | 1 refills | Status: AC

## 2024-07-31 MED ORDER — BUPROPION HCL ER (XL) 450 MG PO TB24: 450.0000 mg | ORAL_TABLET | Freq: Every day | ORAL | 1 refills | Status: AC

## 2024-07-31 MED ORDER — SALINE SPRAY 0.65 % NA SOLN: 1.0000 | NASAL | 1 refills | Status: AC | PRN

## 2024-07-31 MED ORDER — PREDNISONE 20 MG PO TABS: 20.0000 mg | ORAL_TABLET | Freq: Every day | ORAL | 0 refills | Status: AC

## 2024-07-31 MED ORDER — ONDANSETRON HCL 4 MG PO TABS: 4.0000 mg | ORAL_TABLET | Freq: Four times a day (QID) | ORAL | 0 refills | Status: AC | PRN

## 2024-07-31 NOTE — TOC Progression Note (Signed)
 Transition of Care [facility]) - Progression Note    Patient Details  Name: Donna [name] MRN: [id number] Date of Birth: 11-03-1945  [facility]) CM/SW Contact  Debarah Saunas, RN Phone Number: 07/31/2024, 9:41 AM  Clinical Narrative:    ICM consulted regarding DME home nebulizer.     Barriers to Discharge: Continued Medical Work up (labs)               Expected Discharge Plan and Services    Pt qualifies for DME [facility]) nebulizer.  DME ordered through [employer].  Jermaine, liaison of [employer] notified to deliver DME to pt room prior to D/C home.  DME agency information placed on After Visit Summary (AVS) for pt to refer to with any DME questions after discharge.                                             Social Drivers of Health (SDOH) Interventions SDOH Screenings   Food Insecurity: No Food Insecurity (07/26/2024)  Housing: Low Risk (07/26/2024)  Transportation Needs: No Transportation Needs (07/26/2024)  Utilities: Not At Risk (07/26/2024)  Social Connections: Unknown (07/26/2024)  Tobacco Use: Medium Risk (07/29/2024)    Readmission Risk Interventions    07/31/2024    8:20 AM 07/07/2022   12:41 PM  Readmission Risk Prevention Plan  Transportation Screening Complete Complete  PCP or Specialist Appt within 5-7 Days  Complete  PCP or Specialist Appt within 3-5 Days Complete   Home Care Screening  Complete  Medication Review (RN CM)  Complete  HRI or Home Care Consult Complete   Social Work Consult for Recovery Care Planning/Counseling Complete   Palliative Care Screening Not Applicable   Medication Review [employer]) Complete

## 2024-07-31 NOTE — Discharge Summary (Addendum)
 " Physician Discharge Summary   Patient: Donna [name] MRN: [id number] DOB: 10-Apr-1946  Admit date:     07/26/2024  Discharge date: 07/31/2024  Discharge Physician: Dr. Sherre   Video Telemedicine encounter: Patient was able to verify her first and last name and DOB.  PCP: Clarice Nottingham, MD   Recommendations at discharge:   Follow up with PCP within 2 weeks of discharge. I recommend your outpatient provider to check a CMP, cbc, serum magnesium  and serum phosphorus level on outpatient followup appointment. Complete steroid taper, prednisone  20 mg with breakfast starting on 1/2 and last dose on 1/4.   Discharge Diagnoses:  Principal Problem:   Sepsis due to pneumonia [facility]) Active Problems:   Acute respiratory failure with hypoxia (HCC)   Acute kidney injury superimposed on chronic kidney disease   COPD GOLD 2    Primary hypertension   PAD (peripheral artery disease)   Gastroesophageal reflux disease   Hyponatremia   Transaminitis   Lactic acidosis   Elevated pro brain natriuretic peptide (pro BNP) level   Elevated CK   Hypomagnesemia  Resolved Problems:   * No resolved hospital problems. *  Hospital Course:  Donna [name] is a 79 y.o. female with medical history significant of osteoarthritis, CAD, facial basal cell carcinoma, stage III CKD, COPD, COVID-19, depression, GERD, hiatal hernia, hyperlipidemia, hypertension, peripheral vascular disease, seasonal allergies who presented to the emergency department complaints of mild dyspnea, wheezing, cough mostly nonproductive, sinus congestion, rhinorrhea, sore throat, arthralgias, myalgias, about 3 episodes of diarrhea, fatigue, malaise for the past 5 days and anuria for the past 24 hours.  No hemoptysis.  She thinks she got exposed to someone at church as they were several church members with URI symptoms.  No chest pain, palpitations, diaphoresis, PND, orthopnea or pitting edema of the lower extremities.  No abdominal pain, nausea,  emesis, constipation, melena or hematochezia.  No flank pain, dysuria, frequency or hematuria.  No polyuria, polydipsia, polyphagia or blurred vision.    Lab work: CBC showed a white count of 20.4 with 80% neutrophils, hemoglobin 12.8 g/dL and platelets 715.  Lactic acid 3.4 mmol/L.  Venous gas showed a pH of 7.31, pCO2 38 and pO2 37 mmHg, acid-base deficit 6.6 and bicarbonate of 19.1 mmol/L.  Negative coronavirus, influenza and RSV PCR testing.  Procalcitonin 1.58 ng/mL.  proBNP 30 8095 pg/mL.  CMP showed a sodium 131, potassium 3.7, chloride 92 and CO2 17 mmol/L with an anion gap of 22.  AST was 56 and ALT 49 units/L with the rest of the hepatic functions within expected range.  Glucose 148, BUN 74, creatinine 3.57 and corrected calcium  9.1 mg/dL.   Imaging: Portable 1 view chest radiograph showing an asymmetric soft tissue density in the right lung apex medially.  CT chest without contrast recommended to further evaluate showing multifocal pneumonia, unchanged nodular opacities, emphysema and thickened distal esophagus with small hiatal hernia.    ED course: Initial vital signs were temperature 98.9 F, pulse 97, respirations 16, BP 115/76 mmHg and O2 sat 99% on nasal cannula at 2 LPM.  The patient received 2000 mL of LR bolus followed by LR at 150 mL/h x 20 hours, a DuoNeb, ceftriaxone  1 g IVPB and azithromycin  500 mg IVPB.  Significant Events: 07/26/24 - admitted for Sepsis due to Pneumonia 07/27/24 - transferred to [facility] program.   07/28/24 - continues with significant cough, dyspnea improving.  Recurrent lactic acidosis > 4. 1 L bolus fluids.   Day  3 antibiotics.  07/29/24 - Day 4 antibiotics.  Cr recovered to baseline.  Increased coughing overnight, proBNP significantly elevated.  20 mg IV lasix  x 1 ordered for this evening.    12/31: I assumed care of patient. Day 5 of abx. She reports that she is feeling better today.  She had no complaints and she slept well.  She denies fever,  chills, nausea, vomiting, chest pain, shortness of breath.  Check serum magnesium  level, CBC, CMP, CK level, lactic acid.  Discussed during IDR.  07/31/24: Reviewed labs from 12/31. Noted low magnesium  and serum phosphorus levels. Will replete and recheck labs. DME Nebulizer ordered and duo-neb prescribed on discharge.   Assessment and Plan:  * Sepsis due to pneumonia (HCC) As outlined  07/31/24: blood culture results reviewed. NGTD at 5 days. Sepsis has been ruled out.  Acute respiratory failure with hypoxia (HCC) COPD Exacerbation  PNA  Decompensated respiratory status requiring 2 to 3 L nasal cannula in setting of increased work of breathing and transient hypoxia into the upper 80s Noted multifocal pneumonia on CT imaging with underlying emphysema/COPD Respiratory panel, COVID flu RSV negative. 12/29--12/30 -- O2 sats stable on RA Initially on IV Solu-Medrol   Now on PO prednisone  50 mg  DuoNebs, supplemental oxygen  as needed, IS, flutter valve Monitor respiratory status closely 12/31: Continue IV ceftriaxone  1 g IV dialy, Zithromax  to PO, complete day 5 abx  Acute kidney injury superimposed on chronic kidney disease Creatinine 3.5 on presentation with baseline creatinine around 1.3 Baseline GFR in the 50s Suspect likely secondary to prerenal etiology Cr recovered with IV fluids  Cr trend: 3.5 >> 2.25 >> 1.75 >> 1.33 (baseline) today Hold nephrotoxic agents & renally dose meds Monitor daily  Hypomagnesemia With hypophosphatemia Replete with magnesium  2 g IV once and phos nak 1 pack  07/31/24: discussed with patient that I recommend patient get magnesium  and phosphorus checked outpatient with pcp. Patient endorses understanding and compliance  Elevated CK CK level 297 >> 266 >> 567 today despite prior hydration including bolus yesterday.   Continue hold statin   12/31: downtrend to 484 from 567. Encourage oral hydration with water  Elevated pro brain natriuretic peptide (pro  BNP) level proBNP almost doubled since admission post-IV hydration for significant AKI.  Pt with increased coughing overnight & today 20 mg IV Lasix  x 1 Daily weights Further diuresis PRN, reassess daily Monitor renal function closely given just recovered AKI  Lactic acidosis Initial lactate 3.4 on admission >> improved to 2.4 with IV fluids. Repeat lactate yesterday was increased up to 4.2 - likely due to ongoing respiratory issues.  Clinically pt showing no signs of septic shock, is clinically improving otherwise.  Given 1 L bolus LR as Cr still elevated. 12/30 Lactate today up further to 4.3 despite fluids Points to respiratory issues.  12/31: checking lactic acid today, lab in process and resulted in appropriate downtrend to 2.5 from 4.3  Transaminitis Mild AST 56 >> 96 >> 74 >> 68 today ALT 49 >> 68 >> 88 >> 83 today Normal alk phos & bili No abdominal pain/N/V reported. Suspect due to sepsis  12/31: improving as appropriate. Recommend patient to have CMP checked on outpatient visit with pcp.  Hyponatremia Na 131>>137>>143>>139 (resolved)  Gastroesophageal reflux disease PPI  PAD (peripheral artery disease) Stable  Primary hypertension On home amlodipine  5 mg, bisoprolol  10 mg.   12/30 - BP's stable  Monitor BP's and titrate regimen Hold meds if MAP < 65  Pain control - [place]  Controlled Substance Reporting System database was reviewed. and patient was instructed, not to drive, operate heavy machinery, perform activities at heights, swimming or participation in water activities or provide baby-sitting services while on Pain, Sleep and Anxiety Medications; until their outpatient Physician has advised to do so again. Also recommended to not to take more than prescribed Pain, Sleep and Anxiety Medications.  Consultants: pt, ot, toc Procedures performed: none  Disposition: Home Diet recommendation:  Cardiac diet DISCHARGE MEDICATION: Allergies as of  07/31/2024       Reactions   Thorazine [chlorpromazine] Anaphylaxis        Medication List     PAUSE taking these medications    chlorthalidone  25 MG tablet Wait to take this until your doctor or other care provider tells you to start again. Commonly known as: HYGROTON  Take 25 mg by mouth daily.       TAKE these medications    acetaminophen  325 MG tablet Commonly known as: TYLENOL  Take 650 mg by mouth daily as needed for moderate pain (pain score 4-6) or mild pain (pain score 1-3).   albuterol  108 (90 Base) MCG/ACT inhaler Commonly known as: VENTOLIN  HFA Inhale 2 puffs into the lungs every 4 (four) hours as needed for wheezing or shortness of breath.   amLODipine  5 MG tablet Commonly known as: NORVASC  Take 5 mg by mouth daily.   apixaban  5 MG Tabs tablet Commonly known as: Eliquis  Take 1 tablet (5 mg total) by mouth 2 (two) times daily.   benzonatate  200 MG capsule Commonly known as: TESSALON  Take 1 capsule (200 mg total) by mouth 3 (three) times daily as needed for cough.   bisoprolol  10 MG tablet Commonly known as: ZEBETA  Take 10 mg by mouth daily.   Breztri  Aerosphere 160-9-4.8 MCG/ACT Aero inhaler Generic drug: budesonide -glycopyrrolate -formoterol  Inhale 2 puffs into the lungs in the morning and at bedtime.   buPROPion  HCl ER (XL) 450 MG Tb24 Take 1 tablet (450 mg total) by mouth daily. What changed:  medication strength how much to take   chlorpheniramine-HYDROcodone  10-8 MG/5ML Commonly known as: TUSSIONEX Take 5 mLs by mouth every 12 (twelve) hours as needed for up to 2 days (refractory cough).   DULoxetine  20 MG capsule Commonly known as: CYMBALTA  Take 20 mg by mouth daily.   folic acid 1 MG tablet Commonly known as: FOLVITE Take 1 mg by mouth daily.   HYDROXYCHLOROQUINE  SULFATE PO Take 200 mg by mouth daily.   ipratropium 0.06 % nasal spray Commonly known as: ATROVENT  USE 1 SPRAY(S) IN EACH NOSTRIL ONCE DAILY IN THE MORNING    ipratropium-albuterol  0.5-2.5 (3) MG/3ML Soln Commonly known as: DUONEB Take 3 mLs by nebulization every 6 (six) hours.   olmesartan 40 MG tablet Commonly known as: BENICAR Take 40 mg by mouth daily.   ondansetron  4 MG tablet Commonly known as: ZOFRAN  Take 1 tablet (4 mg total) by mouth every 6 (six) hours as needed for nausea.   pantoprazole  40 MG tablet Commonly known as: PROTONIX  Take 40 mg by mouth daily.   predniSONE  20 MG tablet Commonly known as: DELTASONE  Take 1 tablet (20 mg total) by mouth daily with breakfast for 3 days. Start taking on: August 01, 2024   rosuvastatin  5 MG tablet Commonly known as: CRESTOR  Take 5 mg by mouth in the morning.   SIMPONI  ARIA IV Inject into the vein every 30 (thirty) days.   sodium chloride  0.65 % Soln nasal spray Commonly known as: OCEAN  Place 1 spray into both nostrils as needed for congestion.   Vitamin D  (Cholecalciferol ) 25 MCG (1000 UT) Caps 1 capsule.               Durable Medical Equipment  (From admission, onward)           Start     Ordered   07/31/24 0920  For home use only DME Nebulizer machine  Once       Question Answer Comment  Patient needs a nebulizer to treat with the following condition Community acquired pneumonia   Length of Need 6 Months   Additional equipment included Administration kit   Additional equipment included Filter      07/31/24 0919            Contact information for after-discharge care     Durable Medical Equipment     [facility] (DME) .   Service: Durable Medical Equipment Contact information: [address] [place]  [address] [phone]                    Discharge Exam:  Physical exam was completed with the assistance of Lauraine Faes, paramedic who was present in the home at the time of the virtual encounter.  Filed Weights   07/29/24 1100 07/30/24 1419 07/31/24 1050  Weight: 78.4 kg 75.4 kg 76.2 kg    T 98.3, rr 22, hr 87, bp 140/87, spO2 94% on RA.  Physical Exam Constitutional:      Appearance: Normal appearance.  HENT:     Head: Normocephalic and atraumatic.     Right Ear: External ear normal.     Left Ear: External ear normal.  Eyes:     General: No scleral icterus.    Extraocular Movements: Extraocular movements intact.     Conjunctiva/sclera: Conjunctivae normal.  Neck:     Comments: And midline Cardiovascular:     Rate and Rhythm: Normal rate and regular rhythm.     Heart sounds: Normal heart sounds.  Pulmonary:     Effort: Pulmonary effort is normal. No respiratory distress.     Breath sounds: Normal breath sounds.  Abdominal:     General: Bowel sounds are normal.     Palpations: Abdomen is soft.  Musculoskeletal:        General: No swelling. Normal range of motion.     Cervical back: Normal range of motion.  Skin:    Coloration: Skin is not jaundiced or pale.  Neurological:     General: No focal deficit present.     Mental Status: She is alert and oriented to person, place, and time.  Psychiatric:        Mood and Affect: Mood normal.        Behavior: Behavior normal.   Condition at discharge: good  The results of significant diagnostics from this hospitalization (including imaging, microbiology, ancillary and laboratory) are listed below for reference.   Imaging Studies: CT Chest Wo Contrast Result Date: 07/26/2024 CLINICAL DATA:  Pneumonia, complication suspected. Flu-like symptoms. History of COPD. EXAM: CT CHEST WITHOUT CONTRAST TECHNIQUE: Multidetector CT imaging of the chest was performed following the standard protocol without IV contrast. RADIATION DOSE REDUCTION: This exam was performed according to the departmental dose-optimization program which includes automated exposure control, adjustment of the mA and/or kV according to patient size and/or use of iterative reconstruction technique. COMPARISON:  02/27/2024. FINDINGS: Cardiovascular: The heart  is normal in size and there is a trace  pericardial effusion. Multi-vessel coronary artery calcifications are noted. There is atherosclerotic calcification of the aorta without evidence of aneurysm. Pulmonary trunk is normal in caliber. Mediastinum/Nodes: No mediastinal or axillary lymphadenopathy is seen. Evaluation of the hila is limited due to lack of IV contrast. The thyroid  gland and trachea are within normal limits. There is thickening of the walls of the distal esophagus with likely small hiatal hernia, unchanged. Lungs/Pleura: Paraseptal and centrilobular emphysematous changes are present in the lungs. Apical pleural and parenchymal scarring is noted bilaterally. There are multifocal tree-in-bud nodular opacities, bronchial wall thickening, and scattered airspace disease bilaterally. Consolidation is present in the left lower lobe. There is a trace left pleural effusion. No pneumothorax is seen. Previously described nodular opacities are unchanged. Upper Abdomen: No acute abnormality. Musculoskeletal: Bilateral breast implants are noted. Degenerative changes are present in the thoracic spine. No acute osseous abnormality is seen. IMPRESSION: 1. Bilateral tree-in-bud nodules, bronchial wall thickening, and scattered airspace disease, with left basilar consolidation, suggesting multifocal pneumonia. 2. Trace left pleural effusion. 3. Previously described nodular opacities are unchanged. Three to six-month follow-up is recommended to document resolution of new tree-in-bud nodular opacities. 4. Emphysema. 5. Stable thickening of the walls of the distal esophagus with small hiatal hernia. 6. Coronary artery calcifications. 7. Aortic atherosclerosis. Electronically Signed   By: Leita Birmingham M.D.   On: 07/26/2024 16:06   DG Chest Port 1 View Result Date: 07/26/2024 CLINICAL DATA:  Flu-like symptoms. EXAM: PORTABLE CHEST 1 VIEW COMPARISON:  08/15/2023 FINDINGS: Lungs are hyperexpanded. The lungs are clear  without focal pneumonia, edema, pneumothorax or pleural effusion. Asymmetric soft tissue density noted in the right lung apex medially. Interstitial markings are diffusely coarsened with chronic features. The cardiopericardial silhouette is within normal limits for size. Telemetry leads overlie the chest. IMPRESSION: 1. Asymmetric soft tissue density in the right lung apex medially. CT chest without contrast recommended to further evaluate. 2. Hyperexpansion with chronic interstitial coarsening. Electronically Signed   By: Camellia Candle M.D.   On: 07/26/2024 13:38   CUP PACEART REMOTE DEVICE CHECK Result Date: 07/15/2024 ILR summary report received. Battery status OK. Normal device function. No new symptom, tachy, brady, or pause episodes. No new AF episodes. Monthly summary reports and ROV/PRN LA, CVRS   Microbiology: Results for orders placed or performed during the hospital encounter of 07/26/24  Culture, blood (single)     Status: None   Collection Time: 07/26/24 12:23 PM   Specimen: BLOOD  Result Value Ref Range Status   Specimen Description   Final    BLOOD BLOOD RIGHT FOREARM Performed at [facility], [address], [place], [place] [address]    Special Requests   Final    BOTTLES DRAWN AEROBIC ONLY Blood Culture results may not be optimal due to an inadequate volume of blood received in culture bottles Performed at [facility], [address] [place], [place] [address]    Culture   Final    NO GROWTH 5 DAYS Performed at [facility], [address] [place], [place] [address]    Report Status 07/31/2024 FINAL  Final  Resp panel by RT-PCR (RSV, Flu A&B, Covid) Anterior Nasal Swab     Status: None   Collection Time: 07/26/24 12:24 PM   Specimen: Anterior Nasal Swab  Result Value Ref Range Status   SARS Coronavirus 2 by RT PCR NEGATIVE NEGATIVE Final    Comment: (NOTE) SARS-CoV-2 target nucleic acids are NOT DETECTED.  The  SARS-CoV-2 RNA is generally detectable in upper respiratory specimens during the acute phase of infection. The lowest concentration of SARS-CoV-2 viral copies this assay can detect is 138 copies/mL. A negative result does not preclude SARS-Cov-2 infection and should not be used as the sole basis for treatment or other patient management decisions. A negative result may occur with  improper specimen collection/handling, submission of specimen other than nasopharyngeal swab, presence of viral mutation(s) within the areas targeted by this assay, and inadequate number of viral copies(<138 copies/mL). A negative result must be combined with clinical observations, patient history, and epidemiological information. The expected result is Negative.  Fact Sheet for Patients:  bloggercourse.com  Fact Sheet for Healthcare Providers:  seriousbroker.it  This test is no t yet approved or cleared by the [place]  FDA and  has been authorized for detection and/or diagnosis of SARS-CoV-2 by FDA under an Emergency Use Authorization (EUA). This EUA will remain  in effect (meaning this test can be used) for the duration of the COVID-19 declaration under Section 564(b)(1) of the Act, 21 U.S.C.section 360bbb-3(b)(1), unless the authorization is terminated  or revoked sooner.       Influenza A by PCR NEGATIVE NEGATIVE Final   Influenza B by PCR NEGATIVE NEGATIVE Final    Comment: (NOTE) The Xpert Xpress SARS-CoV-2/FLU/RSV plus assay is intended as an aid in the diagnosis of influenza from Nasopharyngeal swab specimens and should not be used as a sole basis for treatment. Nasal washings and aspirates are unacceptable for Xpert Xpress SARS-CoV-2/FLU/RSV testing.  Fact Sheet for Patients: bloggercourse.com  Fact Sheet for Healthcare Providers: seriousbroker.it  This test is not yet approved or  cleared by the [place]  FDA and has been authorized for detection and/or diagnosis of SARS-CoV-2 by FDA under an Emergency Use Authorization (EUA). This EUA will remain in effect (meaning this test can be used) for the duration of the COVID-19 declaration under Section 564(b)(1) of the Act, 21 U.S.C. section 360bbb-3(b)(1), unless the authorization is terminated or revoked.     Resp Syncytial Virus by PCR NEGATIVE NEGATIVE Final    Comment: (NOTE) Fact Sheet for Patients: bloggercourse.com  Fact Sheet for Healthcare Providers: seriousbroker.it  This test is not yet approved or cleared by the [place]  FDA and has been authorized for detection and/or diagnosis of SARS-CoV-2 by FDA under an Emergency Use Authorization (EUA). This EUA will remain in effect (meaning this test can be used) for the duration of the COVID-19 declaration under Section 564(b)(1) of the Act, 21 U.S.C. section 360bbb-3(b)(1), unless the authorization is terminated or revoked.  Performed at [facility], [address] [place], [place] [address]   Respiratory (~20 pathogens) panel by PCR     Status: None   Collection Time: 07/26/24  2:35 PM   Specimen: Nasopharyngeal Swab; Respiratory  Result Value Ref Range Status   Adenovirus NOT DETECTED NOT DETECTED Final   Coronavirus 229E NOT DETECTED NOT DETECTED Final    Comment: (NOTE) The Coronavirus on the Respiratory Panel, DOES NOT test for the novel  Coronavirus (2019 nCoV)    Coronavirus HKU1 NOT DETECTED NOT DETECTED Final   Coronavirus NL63 NOT DETECTED NOT DETECTED Final   Coronavirus OC43 NOT DETECTED NOT DETECTED Final   Metapneumovirus NOT DETECTED NOT DETECTED Final   Rhinovirus / Enterovirus NOT DETECTED NOT DETECTED Final   Influenza A NOT DETECTED NOT DETECTED Final   Influenza B NOT DETECTED NOT DETECTED Final   Parainfluenza Virus 1 NOT DETECTED NOT  DETECTED Final    Parainfluenza Virus 2 NOT DETECTED NOT DETECTED Final   Parainfluenza Virus 3 NOT DETECTED NOT DETECTED Final   Parainfluenza Virus 4 NOT DETECTED NOT DETECTED Final   Respiratory Syncytial Virus NOT DETECTED NOT DETECTED Final   Bordetella pertussis NOT DETECTED NOT DETECTED Final   Bordetella Parapertussis NOT DETECTED NOT DETECTED Final   Chlamydophila pneumoniae NOT DETECTED NOT DETECTED Final   Mycoplasma pneumoniae NOT DETECTED NOT DETECTED Final    Comment: Performed at [facility], [address], [place], [place] [address]   Labs: CBC: Recent Labs  Lab 07/26/24 1208 07/27/24 0434 07/28/24 1132 07/29/24 1208 07/30/24 1455  WBC 20.4* 16.3* 14.3* 15.7* 15.7*  NEUTROABS 16.5*  --   --   --   --   HGB 12.8 11.3* 11.6* 11.3* 11.2*  HCT 38.8 32.7* 35.1* 34.0* 33.0*  MCV 94.4 92.4 95.4 96.3 94.3  PLT 284 263 340 360 336   Basic Metabolic Panel: Recent Labs  Lab 07/26/24 1208 07/27/24 0434 07/28/24 1132 07/29/24 1208 07/30/24 1455 07/31/24 1200  NA 131* 137 143 139 140  --   K 3.7 3.4* 4.3 3.6 4.5  --   CL 92* 100 104 101 103  --   CO2 17* 22 23 21* 22  --   GLUCOSE 148* 114* 126* 96 106*  --   BUN 74* 58* 44* 37* 34*  --   CREATININE 3.57* 2.25* 1.75* 1.33* 1.28*  --   CALCIUM  8.9 8.4* 8.6* 8.3* 8.4*  --   MG  --  1.3* 1.8 1.5* 1.6* 2.3  PHOS  --  2.9 2.1*  --  2.3* 2.4*   Liver Function Tests: Recent Labs  Lab 07/26/24 1208 07/27/24 0434 07/28/24 1132 07/29/24 1208 07/30/24 1455  AST 56* 96* 74* 68* 60*  ALT 49* 68* 88* 83* 67*  ALKPHOS 87 90 93 75 66  BILITOT 0.4 0.2 <0.2 <0.2 0.3  PROT 7.7 6.5 7.2 6.6 6.3*  ALBUMIN  3.7 3.2* 3.6 3.4* 3.0*   Discharge time spent: greater than 30 minutes.  Signed: Dr. Sherre Location: [place] 07/31/2024 "

## 2024-07-31 NOTE — Plan of Care (Signed)
   Problem: Education: Goal: Knowledge of General Education information will improve Description: Including pain rating scale, medication(s)/side effects and non-pharmacologic comfort measures Outcome: Adequate for Discharge   Problem: Health Behavior/Discharge Planning: Goal: Ability to manage health-related needs will improve Outcome: Adequate for Discharge   Problem: Clinical Measurements: Goal: Ability to maintain clinical measurements within normal limits will improve Outcome: Adequate for Discharge Goal: Will remain free from infection Outcome: Adequate for Discharge Goal: Diagnostic test results will improve Outcome: Adequate for Discharge Goal: Respiratory complications will improve Outcome: Adequate for Discharge Goal: Cardiovascular complication will be avoided Outcome: Adequate for Discharge   Problem: Activity: Goal: Risk for activity intolerance will decrease Outcome: Adequate for Discharge   Problem: Nutrition: Goal: Adequate nutrition will be maintained Outcome: Adequate for Discharge   Problem: Coping: Goal: Level of anxiety will decrease Outcome: Adequate for Discharge   Problem: Elimination: Goal: Will not experience complications related to bowel motility Outcome: Adequate for Discharge Goal: Will not experience complications related to urinary retention Outcome: Adequate for Discharge   Problem: Pain Managment: Goal: General experience of comfort will improve and/or be controlled Outcome: Adequate for Discharge   Problem: Safety: Goal: Ability to remain free from injury will improve Outcome: Adequate for Discharge   Problem: Skin Integrity: Goal: Risk for impaired skin integrity will decrease Outcome: Adequate for Discharge   Problem: Education: Goal: Knowledge of disease or condition will improve Outcome: Adequate for Discharge Goal: Knowledge of the prescribed therapeutic regimen will improve Outcome: Adequate for Discharge Goal:  Individualized Educational Video(s) Outcome: Adequate for Discharge   Problem: Activity: Goal: Ability to tolerate increased activity will improve Outcome: Adequate for Discharge Goal: Will verbalize the importance of balancing activity with adequate rest periods Outcome: Adequate for Discharge   Problem: Respiratory: Goal: Ability to maintain a clear airway will improve Outcome: Adequate for Discharge Goal: Levels of oxygenation will improve Outcome: Adequate for Discharge Goal: Ability to maintain adequate ventilation will improve Outcome: Adequate for Discharge   Problem: Activity: Goal: Ability to tolerate increased activity will improve Outcome: Adequate for Discharge   Problem: Clinical Measurements: Goal: Ability to maintain a body temperature in the normal range will improve Outcome: Adequate for Discharge   Problem: Respiratory: Goal: Ability to maintain adequate ventilation will improve Outcome: Adequate for Discharge Goal: Ability to maintain a clear airway will improve Outcome: Adequate for Discharge

## 2024-07-31 NOTE — Plan of Care (Signed)
   Problem: Clinical Measurements: Goal: Ability to maintain clinical measurements within normal limits will improve Outcome: Progressing Goal: Will remain free from infection Outcome: Progressing Goal: Diagnostic test results will improve Outcome: Progressing Goal: Respiratory complications will improve Outcome: Progressing Goal: Cardiovascular complication will be avoided Outcome: Progressing   Problem: Nutrition: Goal: Adequate nutrition will be maintained Outcome: Progressing   Problem: Elimination: Goal: Will not experience complications related to bowel motility Outcome: Progressing Goal: Will not experience complications related to urinary retention Outcome: Progressing   Problem: Safety: Goal: Ability to remain free from injury will improve Outcome: Progressing   Problem: Skin Integrity: Goal: Risk for impaired skin integrity will decrease Outcome: Progressing

## 2024-07-31 NOTE — Progress Notes (Signed)
 Communicated with patient via video tablet. Patient appears comfortable sitting on the chair. AVS paperwork given to the patient. Discharge instructions discussed with the patient and her spouse, Carlin. Patients question answered to satisfaction. Patient agreeable with discharge plan.

## 2024-07-31 NOTE — Assessment & Plan Note (Addendum)
 With hypophosphatemia Replete with magnesium  2 g IV once and phos nak 1 pack  07/31/24: discussed with patient that I recommend patient get magnesium  and phosphorus checked outpatient with pcp. Patient endorses understanding and compliance

## 2024-07-31 NOTE — Progress Notes (Signed)
 1917--Outreach to Spouse-Unable to reach, left voicemail to call.    1918-Outreach to patient, N introduction, patient identifiers, overnight monitoring, medication administration plan and notification of Paramedic arrival completed.   2105-Outreach to discuss plan for medication administration, -spouse reported patient is having dinner and unavailable.     2050--outreach to complete assistance with scheduled 2200 medications-unable to reach.   2308--- Video call completed- Medication administration assistance completed with patient for 1 PO medication and inhaler. Patient and spouse updated and informed per providers notification; IVPB Mag and PHOS-NAK will be added to medications 07/31/24, no additional questions reported at time of updated. Support with further discussion during upcoming provider virtual visit confirmed. [facility] contact plan reviewed.

## 2024-07-31 NOTE — Progress Notes (Signed)
 Pt seen for routine Hath morning visit. Pt appears well and found seated in chair in office. Pt states she is feeling well and has been coughing up more sputum this morning. Pt denies any pain except pain with coughing that subsides as soon as she stops.  Vital signs and assessment obtained as noted. Pt's lung sounds reveal clear but diminished upper fields and diminished lower fields. Pt reports a productive cough with increased sputum that she says is slightly brown. Pt's heart tones are s1, s2 and pulses are strong and irregular with history of a-fib. Pt's abdomen is soft and non-tender with active bowel sounds. Pt has still not had a bm since 07/25/24 but feels it could be related to previously having the norovirus before admission.  Pt had virtual visit with RN and Dr. Sherre. Pt's husband present for encounter.  Medications administered as noted. IV care completed including new curos cap. Labs drawn.   Pt encouraged to call with any problems or questions throughout the day.

## 2024-07-31 NOTE — TOC Progression Note (Signed)
 Transition of Care [facility]) - Progression Note    Patient Details  Name: Donna [name] MRN: [id number] Date of Birth: 1946-01-30  [facility] CM/SW Contact  Debarah Saunas, RN Phone Number: 07/31/2024, 9:01 AM  Clinical Narrative:    RNCM following for discharge needs. Noemi Bellissimo J. Debarah, BSN,  RN, [facility] [phone]     Barriers to Discharge: Continued Medical Work up (labs)               Expected Discharge Plan and Services                                               Social Drivers of Health (SDOH) Interventions SDOH Screenings   Food Insecurity: No Food Insecurity (07/26/2024)  Housing: Low Risk (07/26/2024)  Transportation Needs: No Transportation Needs (07/26/2024)  Utilities: Not At Risk (07/26/2024)  Social Connections: Unknown (07/26/2024)  Tobacco Use: Medium Risk (07/29/2024)    Readmission Risk Interventions    07/31/2024    8:20 AM 07/07/2022   12:41 PM  Readmission Risk Prevention Plan  Transportation Screening Complete Complete  PCP or Specialist Appt within 5-7 Days  Complete  PCP or Specialist Appt within 3-5 Days Complete   Home Care Screening  Complete  Medication Review (RN CM)  Complete  HRI or Home Care Consult Complete   Social Work Consult for Recovery Care Planning/Counseling Complete   Palliative Care Screening Not Applicable   Medication Review Ocean[employer]) Complete

## 2024-08-01 ENCOUNTER — Telehealth (HOSPITAL_COMMUNITY): Payer: Self-pay

## 2024-08-01 ENCOUNTER — Other Ambulatory Visit (HOSPITAL_COMMUNITY): Payer: Self-pay

## 2024-08-01 ENCOUNTER — Telehealth: Payer: Self-pay

## 2024-08-01 NOTE — Telephone Encounter (Signed)
 RNCM received message that pt had not received DME nebulizer from [employer].  RNCM contacted liaison, Jermaine who was apologetic and states that it will be delivered within the hour.  RNCM will follow-up with pt.  Meckenzie Balsley J. Debarah, BSN, RN, [facility]  Inpatient Care Management  Nurse Case Manager  [facility] Emergency Departments  Operative Services  [phone]

## 2024-08-01 NOTE — Transitions of Care (Post Inpatient/ED Visit) (Signed)
" ° °  08/01/2024  Name: Donna [name] MRN: [id number] DOB: April 19, 1946  Today's TOC FU Call Status: Today's TOC FU Call Status:: Unsuccessful Call (1st Attempt) Unsuccessful Call (1st Attempt) Date: 08/01/24  Attempted to reach the patient regarding the most recent Inpatient/ED visit.  Follow Up Plan: Additional outreach attempts will be made to reach the patient to complete the Transitions of Care (Post Inpatient/ED visit) call.   Ikaika Showers J. Mozel Burdett RN, MSN [facility], [facility] RN Care Manager Direct Dial: [phone]  Fax: [phone] Website: [email or web address]   "

## 2024-08-04 NOTE — Telephone Encounter (Signed)
 Pharmacy Patient Advocate Encounter  Received notification from [facility] that Prior Authorization for buPROPion  HCl ER (XL) 450MG  er tablets has been DENIED.  Full denial letter will be uploaded to the media tab. See denial reason below.   PA #/Case ID/Reference #: [id number]

## 2024-08-08 ENCOUNTER — Other Ambulatory Visit: Payer: Self-pay | Admitting: Internal Medicine

## 2024-08-08 DIAGNOSIS — R918 Other nonspecific abnormal finding of lung field: Secondary | ICD-10-CM

## 2024-08-08 NOTE — Progress Notes (Unsigned)
 " Cardiology Office Note:  .   Date:  08/08/2024  ID:  Donna [name], DOB Nov 01, 1945, MRN [id number] PCP: Clarice Nottingham, MD  [facility] Providers Cardiologist:  Gordy Bergamo, MD Electrophysiologist:  Soyla Gladis Norton, MD {  History of Present Illness: .   Donna [name] is a 79 y.o. 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     Coronary artery calcifications 03/06/2024 CT of the chest three-vessel coronary artery calcifications  Atrial fibrillation 05/11/2024 device check AF burden 1.4% Low burden.  No evidence of atrial fibrillation on most recent device check 06/2024  PAD 2023 left femoral artery endarterectomy by Dr. Serene secondary to high-grade/occlusion of the left CFA  01/18/2023 successful atherectomy followed by balloon angioplasty of the right SFA  04/2024 lower extremity arterial Dopplers with no large vessel occlusions.  Previous interventions with continued patent results.  Strokelike symptoms 08/2023 ILR implanted  Social history  Former smoker.  30+ years.  Quit smoking 10+ years ago Occasionally drinks wine 2-3 times per week. No drugs, no exercise.     Patient with history of coronary artery calcifications noted on noncardiac CT.  Has history of atrial fibrillation with very low burden.  Has ILR for history of strokelike symptoms and PAD followed by Dr. Bergamo and vascular surgery.  Last seen by cardiology 04/2024 doing well from a cardiac perspective.  No complaints.  Patient recently admitted to the hospital 06/2024 for sepsis secondary to multifocal pneumonia with underlying emphysema/COPD.  Respiratory panel was negative.  Had lactic acidosis as high as 3.4 during admission.  Complicated by AKI but improved at the time of discharge.  Had mild transaminitis.   Chlorthalidone  was paused.  Today patient presents after hospitalization and accompanied with her husband today.  They have been reporting chronic shortness of breath however since discharge from the hospital feels that this has gotten worse and having shortness of breath with short distances although was walking with a walker today and got up to our floor without needing oxygen .  Does not require oxygen  at baseline.  She reports prior history of COPD/emphysema and previously followed by pulmonologist but now reports primary care is now managing.  She reports no symptoms of weight gain, peripheral edema, orthopnea, chest pain.  Audibly wheezing in the room today.  She is walking without any claudication symptoms, only has knee pain.  ROS: Denies: Chest pain, orthopnea, peripheral edema, palpitations, syncope, fatigue, dizziness.   + coughing  Studies Reviewed: .         Risk Assessment/Calculations:    CHA2DS2-VASc Score = 7   This indicates a 11.2% annual risk of stroke. The patient's score is based upon: CHF History: 0 HTN History: 1 Diabetes History: 0 Stroke History: 2 (TIA) Vascular Disease History: 1 Age Score: 2 Gender Score: 1      Physical Exam:   VS:  There were no vitals taken for this visit.   Wt Readings from Last 3 Encounters:  07/31/24 168 lb 1.6 oz (76.2 kg)  05/26/24 167 lb 6.4 oz (75.9 kg)  05/12/24 163 lb 3.2 oz (74 kg)    GEN: Well nourished, well developed in no acute distress NECK: No JVD; No carotid bruits CARDIAC: RRR, no murmurs, rubs, gallops RESPIRATORY: Diminished breath sounds, audible wheezing  ABDOMEN: Soft, non-tender, non-distended EXTREMITIES:  No edema; No deformity  ASSESSMENT AND PLAN: .    SOB History of COPD In the setting of known emphysema/COPD with recent admission with multifocal pneumonia.  She has shortness of breath with short distances.  No clear clinical findings of CHF although did have elevated proBNP during  hospitalization which likely is nonspecific in the setting of acute illness.  Suspect her shortness of breath is primarily driven by pulmonary issues.  She is coughing, has decreased respiratory effort and diminished breath sounds on exam.  Recommend follow-up with her PCP. Will order echocardiogram to ensure no evidence of CHF though. Initially she has another CT scan ordered although just had 1 in December.  Will defer to PCP if they feel this is still necessary.  Report had recommended 3 to 21-month follow-up for nodular opacities that were unchanged 06/2024. Would recommend follow-up with pulmonology.  Coronary artery calcifications Hyperlipidemia -03/06/2024 CT of the chest three-vessel coronary artery calcifications If she has persistent shortness of breath despite treatment of her underlying pulmonary disease and echocardiogram is unremarkable then at that time would consider further evaluation of her coronary artery disease. No aspirin  with DOAC. Continue with rosuvastatin  20 mg daily.  Had mild transaminitis that seems to be stable without upward trend.  Likely reactive from her sepsis.  Check LFTs. LDL 77 04/2024, close to goal less than 70.  Atrial fibrillation Low burden.  No evidence of atrial fibrillation on most recent device check 06/2024.  Based on exam in sinus rhythm on today's exam. Continue Eliquis  5 mg twice daily and bisoprolol  10 mg daily.  PAD -01/18/2023 successful atherectomy followed by balloon angioplasty of the right SFA  Followed by Dr. Ladona.  04/2024 Dopplers show continued good results.  She is without claudication symptoms.  Strokelike symptoms with ILR.  Hypertension Blood pressure well-controlled.  Continue amlodipine  5 mg, bisoprolol  10 mg.  Continue to hold chlorthalidone .    Dispo: Follow-up in 3 to 4 months for shortness of breath and PAD.  This will give her time to get evaluated by pulmonary/PCP.  Signed, Thom LITTIE Sluder, PA-C  "

## 2024-08-12 ENCOUNTER — Encounter: Payer: Self-pay | Admitting: Cardiology

## 2024-08-12 ENCOUNTER — Ambulatory Visit: Attending: Cardiology | Admitting: Cardiology

## 2024-08-12 VITALS — BP 106/50 | HR 72 | Ht 66.0 in | Wt 161.0 lb

## 2024-08-12 DIAGNOSIS — R0602 Shortness of breath: Secondary | ICD-10-CM | POA: Diagnosis present

## 2024-08-12 LAB — HEPATIC FUNCTION PANEL
ALT: 20 IU/L (ref 0–32)
AST: 26 IU/L (ref 0–40)
Albumin: 4.1 g/dL (ref 3.8–4.8)
Alkaline Phosphatase: 57 IU/L (ref 49–135)
Bilirubin Total: 0.3 mg/dL (ref 0.0–1.2)
Bilirubin, Direct: 0.1 mg/dL (ref 0.00–0.40)
Total Protein: 6.4 g/dL (ref 6.0–8.5)

## 2024-08-12 NOTE — Patient Instructions (Signed)
 Medication Instructions:  Your physician recommends that you continue on your current medications as directed. Please refer to the Current Medication list given to you today.  *If you need a refill on your cardiac medications before your next appointment, please call your pharmacy*  Testing/Procedures: Your physician has requested that you have an echocardiogram. Echocardiography is a painless test that uses sound waves to create images of your heart. It provides your doctor with information about the size and shape of your heart and how well your hearts chambers and valves are working. This procedure takes approximately one hour. There are no restrictions for this procedure. Please do NOT wear cologne, perfume, aftershave, or lotions (deodorant is allowed). Please arrive 15 minutes prior to your appointment time.  Please note: We ask at that you not bring children with you during ultrasound (echo/ vascular) testing. Due to room size and safety concerns, children are not allowed in the ultrasound rooms during exams. Our front office staff cannot provide observation of children in our lobby area while testing is being conducted. An adult accompanying a patient to their appointment will only be allowed in the ultrasound room at the discretion of the ultrasound technician under special circumstances. We apologize for any inconvenience.   Follow-Up: At [facility], you and your health needs are our priority.  As part of our continuing mission to provide you with exceptional heart care, our providers are all part of one team.  This team includes your primary Cardiologist (physician) and Advanced Practice Providers or APPs (Physician Assistants and Nurse Practitioners) who all work together to provide you with the care you need, when you need it.  Your next appointment:   3-4 month(s)  Provider:   Gordy Bergamo, MD

## 2024-08-13 ENCOUNTER — Encounter

## 2024-08-13 ENCOUNTER — Ambulatory Visit: Payer: Self-pay | Admitting: Cardiology

## 2024-08-13 DIAGNOSIS — I480 Paroxysmal atrial fibrillation: Secondary | ICD-10-CM

## 2024-08-13 LAB — CUP PACEART REMOTE DEVICE CHECK
Date Time Interrogation Session: 20260113234807
Implantable Pulse Generator Implant Date: 20250115

## 2024-08-13 NOTE — Progress Notes (Signed)
Patient has been notified directly; all questions, if any, were answered. Patient voiced understanding.   

## 2024-08-15 ENCOUNTER — Ambulatory Visit: Payer: Self-pay | Admitting: Cardiology

## 2024-08-18 ENCOUNTER — Ambulatory Visit (HOSPITAL_COMMUNITY): Admitting: Physician Assistant

## 2024-08-18 ENCOUNTER — Encounter (HOSPITAL_COMMUNITY): Payer: Self-pay

## 2024-08-18 NOTE — Progress Notes (Signed)
 Remote Loop Recorder Transmission

## 2024-08-20 ENCOUNTER — Ambulatory Visit
Admission: RE | Admit: 2024-08-20 | Discharge: 2024-08-20 | Disposition: A | Source: Ambulatory Visit | Attending: Internal Medicine | Admitting: Internal Medicine

## 2024-08-20 DIAGNOSIS — R918 Other nonspecific abnormal finding of lung field: Secondary | ICD-10-CM

## 2024-09-01 ENCOUNTER — Ambulatory Visit

## 2024-09-01 DIAGNOSIS — Z01810 Encounter for preprocedural cardiovascular examination: Secondary | ICD-10-CM

## 2024-09-01 DIAGNOSIS — Z01818 Encounter for other preprocedural examination: Secondary | ICD-10-CM

## 2024-09-01 NOTE — Progress Notes (Signed)
"  ° °  Virtual Visit via Telephone Note   Because of Donna [name] co-morbid illnesses, she is at least at moderate risk for complications without adequate follow up.  This format is felt to be most appropriate for this patient at this time.  Due to technical limitations with video connection (technology), today's appointment will be conducted as an audio only telehealth visit, and IREM STONEHAM verbally agreed to proceed in this manner.   All issues noted in this document were discussed and addressed.  No physical exam could be performed with this format.  Evaluation Performed:  Preoperative cardiovascular risk assessment _____________   Date:  09/01/2024   Patient ID:  [name], Donna [birth year], MRN [id number] Patient Location:  Home Provider location:   Office  Primary Care Provider:  Clarice Nottingham, MD Primary Cardiologist:  Gordy Bergamo, MD  -She has canceled procedure until she feels that her stamina has improved from prior hospitalization with pneumonia.    Donna [name] Pizza, FNP  09/01/2024, 9:47 AM  "

## 2024-09-08 ENCOUNTER — Ambulatory Visit (HOSPITAL_COMMUNITY): Admit: 2024-09-08 | Admitting: Orthopedic Surgery

## 2024-09-08 SURGERY — ARTHROPLASTY, KNEE, TOTAL
Anesthesia: Choice | Site: Knee | Laterality: Left

## 2024-09-11 ENCOUNTER — Ambulatory Visit (HOSPITAL_COMMUNITY)

## 2024-09-13 ENCOUNTER — Encounter

## 2024-10-08 ENCOUNTER — Ambulatory Visit: Admitting: Internal Medicine

## 2024-10-14 ENCOUNTER — Ambulatory Visit

## 2024-11-11 ENCOUNTER — Ambulatory Visit: Admitting: Cardiology

## 2024-11-14 ENCOUNTER — Ambulatory Visit

## 2024-12-15 ENCOUNTER — Ambulatory Visit

## 2025-01-15 ENCOUNTER — Ambulatory Visit

## 2025-02-15 ENCOUNTER — Ambulatory Visit

## 2025-03-18 ENCOUNTER — Ambulatory Visit

## 2025-04-18 ENCOUNTER — Ambulatory Visit

## 2025-05-19 ENCOUNTER — Ambulatory Visit
